# Patient Record
Sex: Male | Born: 1950 | Race: Black or African American | Hispanic: No | Marital: Married | Smoking: Current every day smoker
Health system: Southern US, Community
[De-identification: ages and names within clinical notes are randomized; demographics above are authoritative.]

## PROBLEM LIST (undated history)

## (undated) DIAGNOSIS — N1330 Unspecified hydronephrosis: Secondary | ICD-10-CM

## (undated) DIAGNOSIS — N200 Calculus of kidney: Secondary | ICD-10-CM

## (undated) DIAGNOSIS — R0600 Dyspnea, unspecified: Secondary | ICD-10-CM

## (undated) DIAGNOSIS — J069 Acute upper respiratory infection, unspecified: Secondary | ICD-10-CM

## (undated) DIAGNOSIS — R9431 Abnormal electrocardiogram [ECG] [EKG]: Secondary | ICD-10-CM

## (undated) DIAGNOSIS — E876 Hypokalemia: Secondary | ICD-10-CM

## (undated) DIAGNOSIS — C801 Malignant (primary) neoplasm, unspecified: Secondary | ICD-10-CM

## (undated) DIAGNOSIS — M1990 Unspecified osteoarthritis, unspecified site: Secondary | ICD-10-CM

## (undated) DIAGNOSIS — C20 Malignant neoplasm of rectum: Secondary | ICD-10-CM

## (undated) DIAGNOSIS — G4730 Sleep apnea, unspecified: Secondary | ICD-10-CM

## (undated) DIAGNOSIS — Z789 Other specified health status: Secondary | ICD-10-CM

## (undated) DIAGNOSIS — N134 Hydroureter: Secondary | ICD-10-CM

## (undated) DIAGNOSIS — J449 Chronic obstructive pulmonary disease, unspecified: Secondary | ICD-10-CM

## (undated) DIAGNOSIS — R011 Cardiac murmur, unspecified: Secondary | ICD-10-CM

## (undated) DIAGNOSIS — I10 Essential (primary) hypertension: Secondary | ICD-10-CM

## (undated) DIAGNOSIS — Z923 Personal history of irradiation: Secondary | ICD-10-CM

## (undated) HISTORY — DX: Cardiac murmur, unspecified: R01.1

## (undated) HISTORY — DX: Unspecified hydronephrosis: N13.30

## (undated) HISTORY — DX: Personal history of irradiation: Z92.3

## (undated) HISTORY — PX: COLON SURGERY: SHX602

## (undated) HISTORY — PX: OTHER SURGICAL HISTORY: SHX169

## (undated) HISTORY — DX: Unspecified osteoarthritis, unspecified site: M19.90

## (undated) HISTORY — PX: COLOSTOMY: SHX63

## (undated) HISTORY — DX: Abnormal electrocardiogram (ECG) (EKG): R94.31

## (undated) HISTORY — DX: Dyspnea, unspecified: R06.00

## (undated) HISTORY — DX: Essential (primary) hypertension: I10

---

## 2005-07-01 ENCOUNTER — Ambulatory Visit: Payer: Self-pay | Admitting: Internal Medicine

## 2005-11-19 ENCOUNTER — Ambulatory Visit: Payer: Self-pay | Admitting: Internal Medicine

## 2006-06-02 ENCOUNTER — Ambulatory Visit: Payer: Self-pay | Admitting: Internal Medicine

## 2006-11-23 ENCOUNTER — Ambulatory Visit: Payer: Self-pay | Admitting: Internal Medicine

## 2007-05-25 ENCOUNTER — Ambulatory Visit: Payer: Self-pay | Admitting: Internal Medicine

## 2007-07-09 DIAGNOSIS — I10 Essential (primary) hypertension: Secondary | ICD-10-CM

## 2008-05-05 ENCOUNTER — Telehealth: Payer: Self-pay | Admitting: Internal Medicine

## 2008-06-05 ENCOUNTER — Ambulatory Visit: Payer: Self-pay | Admitting: Internal Medicine

## 2008-06-05 DIAGNOSIS — N410 Acute prostatitis: Secondary | ICD-10-CM | POA: Insufficient documentation

## 2008-06-05 DIAGNOSIS — N342 Other urethritis: Secondary | ICD-10-CM | POA: Insufficient documentation

## 2008-06-05 LAB — CONVERTED CEMR LAB
ALT: 14 units/L (ref 0–53)
AST: 16 units/L (ref 0–37)
Alkaline Phosphatase: 104 units/L (ref 39–117)
Basophils Absolute: 0 10*3/uL (ref 0.0–0.1)
Basophils Relative: 0.2 % (ref 0.0–1.0)
Bilirubin Urine: NEGATIVE
Bilirubin, Direct: 0.1 mg/dL (ref 0.0–0.3)
CO2: 32 meq/L (ref 19–32)
Chloride: 101 meq/L (ref 96–112)
Cholesterol: 167 mg/dL (ref 0–200)
LDL Cholesterol: 118 mg/dL — ABNORMAL HIGH (ref 0–99)
Lymphocytes Relative: 25.2 % (ref 12.0–46.0)
MCHC: 34.5 g/dL (ref 30.0–36.0)
Neutrophils Relative %: 68 % (ref 43.0–77.0)
Nitrite: NEGATIVE
RBC: 4.69 M/uL (ref 4.22–5.81)
RDW: 11.8 % (ref 11.5–14.6)
Sodium: 141 meq/L (ref 135–145)
Total Bilirubin: 1.2 mg/dL (ref 0.3–1.2)
Urobilinogen, UA: 2
VLDL: 19 mg/dL (ref 0–40)

## 2008-06-20 ENCOUNTER — Telehealth: Payer: Self-pay | Admitting: Internal Medicine

## 2009-10-15 ENCOUNTER — Telehealth: Payer: Self-pay | Admitting: Family Medicine

## 2010-01-07 ENCOUNTER — Ambulatory Visit: Payer: Self-pay | Admitting: Internal Medicine

## 2010-01-07 DIAGNOSIS — F17200 Nicotine dependence, unspecified, uncomplicated: Secondary | ICD-10-CM | POA: Insufficient documentation

## 2010-01-07 DIAGNOSIS — H6120 Impacted cerumen, unspecified ear: Secondary | ICD-10-CM | POA: Insufficient documentation

## 2010-02-19 ENCOUNTER — Ambulatory Visit: Payer: Self-pay | Admitting: Internal Medicine

## 2010-02-19 LAB — CONVERTED CEMR LAB
ALT: 20 units/L (ref 0–53)
AST: 19 units/L (ref 0–37)
Alkaline Phosphatase: 116 units/L (ref 39–117)
CO2: 29 meq/L (ref 19–32)
Calcium: 8.6 mg/dL (ref 8.4–10.5)
Creatinine, Ser: 1.2 mg/dL (ref 0.4–1.5)
Eosinophils Relative: 1.5 % (ref 0.0–5.0)
Glucose, Bld: 131 mg/dL — ABNORMAL HIGH (ref 70–99)
HCT: 43 % (ref 39.0–52.0)
Hemoglobin: 14.7 g/dL (ref 13.0–17.0)
Ketones, ur: NEGATIVE mg/dL
Lymphocytes Relative: 27.1 % (ref 12.0–46.0)
Lymphs Abs: 2.4 10*3/uL (ref 0.7–4.0)
Monocytes Relative: 6.3 % (ref 3.0–12.0)
Platelets: 205 10*3/uL (ref 150.0–400.0)
Specific Gravity, Urine: 1.025 (ref 1.000–1.030)
TSH: 3.19 microintl units/mL (ref 0.35–5.50)
Total Bilirubin: 0.7 mg/dL (ref 0.3–1.2)
Total CHOL/HDL Ratio: 4
Total Protein, Urine: NEGATIVE mg/dL
Urine Glucose: NEGATIVE mg/dL
Urobilinogen, UA: 1 (ref 0.0–1.0)
WBC: 8.9 10*3/uL (ref 4.5–10.5)
pH: 6 (ref 5.0–8.0)

## 2010-04-24 ENCOUNTER — Ambulatory Visit: Payer: Self-pay | Admitting: Internal Medicine

## 2010-04-24 DIAGNOSIS — K648 Other hemorrhoids: Secondary | ICD-10-CM

## 2010-06-24 ENCOUNTER — Ambulatory Visit: Payer: Self-pay | Admitting: Internal Medicine

## 2010-06-24 LAB — CONVERTED CEMR LAB
Calcium: 8.9 mg/dL (ref 8.4–10.5)
GFR calc non Af Amer: 75.42 mL/min (ref >60)
Potassium: 3.6 meq/L (ref 3.5–5.1)
Sodium: 143 meq/L (ref 135–145)

## 2010-06-27 ENCOUNTER — Ambulatory Visit: Payer: Self-pay | Admitting: Internal Medicine

## 2010-06-27 DIAGNOSIS — J321 Chronic frontal sinusitis: Secondary | ICD-10-CM | POA: Insufficient documentation

## 2010-06-27 DIAGNOSIS — G4730 Sleep apnea, unspecified: Secondary | ICD-10-CM

## 2010-06-27 DIAGNOSIS — G4710 Hypersomnia, unspecified: Secondary | ICD-10-CM | POA: Insufficient documentation

## 2010-07-30 ENCOUNTER — Encounter: Payer: Self-pay | Admitting: Internal Medicine

## 2011-01-09 NOTE — Assessment & Plan Note (Signed)
Summary: cpx/njr--PT [facility] // RS   Vital Signs:  Patient profile:   60 year old male Height:      70 inches Weight:      198 pounds BMI:     28.51 Temp:     98.2 degrees F oral Pulse rate:   76 / minute Resp:     14 per minute BP sitting:   140 / 82  (left arm)  Vitals Entered By: Willy Eddy, LPN (Apr 24, 2010 3:01 PM)  Nutrition Counseling: Patient's BMI is greater than 25 and therefore counseled on weight management options. CC: cpx-no colonoscopy--coulndt afford chantix, Hypertension Management   CC:  cpx-no colonoscopy--coulndt afford chantix and Hypertension Management.  History of Present Illness: The pt was asked about all immunizations, health maint. services that are appropriate to their age and was given guidance on diet exercize  and weight management   pt complains of BRBPR with hx of hemmorhoids  Hypertension History:      He denies headache, chest pain, palpitations, dyspnea with exertion, orthopnea, PND, peripheral edema, visual symptoms, neurologic problems, syncope, and side effects from treatment.        Positive major cardiovascular risk factors include male age 15 years old or older, hypertension, and current tobacco user.  Negative major cardiovascular risk factors include negative family history for ischemic heart disease.     Preventive Screening-Counseling & Management  Alcohol-Tobacco     Smoking Status: current     Packs/Day: 1     Year Started: 1968  Problems Prior to Update: 1)  Cerumen Impaction  (ICD-380.4) 2)  Cigarette Smoker  (ICD-305.1) 3)  Unspecified Urethritis  (ICD-597.80) 4)  Acute Prostatitis  (ICD-601.0) 5)  Physical Examination  (ICD-V70.0) 6)  Hypertension  (ICD-401.9)  Current Problems (verified): 1)  Cerumen Impaction  (ICD-380.4) 2)  Cigarette Smoker  (ICD-305.1) 3)  Unspecified Urethritis  (ICD-597.80) 4)  Acute Prostatitis  (ICD-601.0) 5)  Physical Examination  (ICD-V70.0) 6)  Hypertension   (ICD-401.9)  Medications Prior to Update: 1)  Amlodipine Besylate 5 Mg Tabs (Amlodipine Besylate) .... One By Mouth Daily 2)  Bisoprolol-Hydrochlorothiazide 5-6.25 Mg Tabs (Bisoprolol-Hydrochlorothiazide) .... One By Mouth Daily 3)  Chantix 0.5 Mg Tabs (Varenicline Tartrate) .Marland Kitchen.. 1 Two Times A Day For 14 Days 4)  Chantix 1 Mg Tabs (Varenicline Tartrate) .Marland Kitchen.. 1 Two Times A Day After Completing .5 Mg For 14 Days- Then Pick A Stop Date After Completing 10 Days of 1mg  Two Times A Day  Current Medications (verified): 1)  Amlodipine Besylate 5 Mg Tabs (Amlodipine Besylate) .... One By Mouth Daily 2)  Bisoprolol-Hydrochlorothiazide 5-6.25 Mg Tabs (Bisoprolol-Hydrochlorothiazide) .... One By Mouth Daily 3)  Klor-Con 10 10 Meq Cr-Tabs (Potassium Chloride) .... One By Mouth Daily ( Take 4 Today Then Two For One Week The One A Day 4)  Ciprofloxacin Hcl 500 Mg Tabs (Ciprofloxacin Hcl) .... One By Mouth Two Times A Day For 14 Days 5)  Lidocaine-Hydrocortisone Ace 3-0.5 % Crea (Lidocaine-Hydrocortisone Ace) .... Apply Pr Two Times A Day As Needed For Hemorrhoids  Allergies (verified): No Known Drug Allergies  Past History:  Family History: Last updated: 07/09/2007 Family History Hypertension Fam hx Lupus  Social History: Last updated: 07/09/2007 Alcohol use-no Drug use-no  Risk Factors: Smoking Status: current (04/24/2010) Packs/Day: 1 (04/24/2010)  Past medical, surgical, family and social histories (including risk factors) reviewed, and no changes noted (except as noted below).  Past Medical History: Reviewed history from 07/09/2007 and no changes required. Abnormal EKG  Hypertension  Past Surgical History: Reviewed history from 06/05/2008 and no changes required. wrist and hand surgery  Family History: Reviewed history from 07/09/2007 and no changes required. Family History Hypertension Fam hx Lupus  Social History: Reviewed history from 07/09/2007 and no changes  required. Alcohol use-no Drug use-no  Review of Systems  The patient denies anorexia, fever, weight loss, weight gain, vision loss, decreased hearing, hoarseness, chest pain, syncope, dyspnea on exertion, peripheral edema, prolonged cough, headaches, hemoptysis, abdominal pain, melena, hematochezia, severe indigestion/heartburn, hematuria, incontinence, genital sores, muscle weakness, suspicious skin lesions, transient blindness, difficulty walking, depression, unusual weight change, abnormal bleeding, enlarged lymph nodes, angioedema, breast masses, and testicular masses.    Physical Exam  General:  Well-developed,well-nourished,in no acute distress; alert,appropriate and cooperative throughout examination Head:  normocephalic, no abnormalities observed, and male-pattern balding.   Eyes:  pupils equal, pupils round, pupils reactive to light, and no nystagmus.   Ears:  R ear normal and L ear normal.   Nose:  no external deformity and no nasal discharge.   Neck:  No deformities, masses, or tenderness noted. Chest Wall:  No deformities, masses, tenderness or gynecomastia noted. Lungs:  normal respiratory effort and no wheezes.   Heart:  normal rate and regular rhythm.   Abdomen:  Bowel sounds positive,abdomen soft and non-tender without masses, organomegaly or hernias noted. Rectal:  external hemorrhoid(s) and internal hemorrhoid(s).   Genitalia:  circumcised.   Prostate:  tender, boggy, and 1+ enlarged.   Msk:  No deformity or scoliosis noted of thoracic or lumbar spine.   Pulses:  R and L carotid,radial,femoral,dorsalis pedis and posterior tibial pulses are full and equal bilaterally Extremities:  No clubbing, cyanosis, edema, or deformity noted with normal full range of motion of all joints.   Neurologic:  alert & oriented X3 and gait normal.     Impression & Recommendations:  Problem # 1:  HYPERTENSION (ICD-401.9)  His updated medication list for this problem includes:     Amlodipine Besylate 5 Mg Tabs (Amlodipine besylate) ..... One by mouth daily    Bisoprolol-hydrochlorothiazide 5-6.25 Mg Tabs (Bisoprolol-hydrochlorothiazide) ..... One by mouth daily  Orders: EKG w/ Interpretation (93000)  BP today: 140/82 Prior BP: 150/80 (01/07/2010)  Prior 10 Yr Risk Heart Disease: 40 % (01/07/2010)  Labs Reviewed: K+: 2.9 (02/19/2010) Creat: : 1.2 (02/19/2010)   Chol: 155 (02/19/2010)   HDL: 38.70 (02/19/2010)   LDL: 88 (02/19/2010)   TG: 143.0 (02/19/2010)  Problem # 2:  INTERNAL HEMORRHOIDS WITH OTHER COMPLICATION (ICD-455.2) call in hemrrhoidal treatemnt  Problem # 3:  INTERNAL HEMORRHOIDS WITH OTHER COMPLICATION (ICD-455.2)  Problem # 4:  PHYSICAL EXAMINATION (ICD-V70.0) The pt was asked about all immunizations, health maint. services that are appropriate to their age and was given guidance on diet exercize  and weight management  Td Booster: Historical (12/08/2005)   Flu Vax: Fluvax 3+ (01/07/2010)   Chol: 155 (02/19/2010)   HDL: 38.70 (02/19/2010)   LDL: 88 (02/19/2010)   TG: 143.0 (02/19/2010) TSH: 3.19 (02/19/2010)   PSA: 1.33 (02/19/2010)  Discussed using sunscreen, use of alcohol, drug use, self testicular exam, routine dental care, routine eye care, routine physical exam, seat belts, multiple vitamins, osteoporosis prevention, adequate calcium intake in diet, and recommendations for immunizations.  Discussed exercise and checking cholesterol.  Discussed gun safety, safe sex, and contraception. Also recommend checking PSA.  Complete Medication List: 1)  Amlodipine Besylate 5 Mg Tabs (Amlodipine besylate) .... One by mouth daily 2)  Bisoprolol-hydrochlorothiazide 5-6.25 Mg Tabs (Bisoprolol-hydrochlorothiazide) .Marland KitchenMarland KitchenMarland Kitchen  One by mouth daily 3)  Klor-con 10 10 Meq Cr-tabs (Potassium chloride) .... One by mouth daily ( take 4 today then two for one week the one a day 4)  Ciprofloxacin Hcl 500 Mg Tabs (Ciprofloxacin hcl) .... One by mouth two times a day for 14  days 5)  Lidocaine-hydrocortisone Ace 3-0.5 % Crea (Lidocaine-hydrocortisone ace) .... Apply pr two times a day as needed for hemorrhoids  Hypertension Assessment/Plan:      The patient's hypertensive risk group is category B: At least one risk factor (excluding diabetes) with no target organ damage.  His calculated 10 year risk of coronary heart disease is 18 %.  Today's blood pressure is 140/82.  His blood pressure goal is < 140/90.  Patient Instructions: 1)  Please schedule a follow-up appointment in 2 months 2)  BMP prior to visit, ICD-9:401.90 Prescriptions: LIDOCAINE-HYDROCORTISONE ACE 3-0.5 % CREA (LIDOCAINE-HYDROCORTISONE ACE) apply PR two times a day as needed for hemorrhoids  #3 tubes x 3   Entered and Authorized by:   Stacie Glaze MD   Signed by:   Stacie Glaze MD on 04/24/2010   Method used:   Electronically to        [facility]* (mail-order)             ,          Ph: [phone]       Fax: [phone]   RxID:   [id number] CIPROFLOXACIN HCL 500 MG TABS (CIPROFLOXACIN HCL) one by mouth two times a day for 14 days  #28 x 0   Entered and Authorized by:   Stacie Glaze MD   Signed by:   Stacie Glaze MD on 04/24/2010   Method used:   Electronically to        [facility]  [address] [id number] (retail)       [address]       [place], [place]  [address]       Ph: [phone] or [phone]       Fax: [phone]   RxID:   [id number] KLOR-CON 10 10 MEQ CR-TABS (POTASSIUM CHLORIDE) one by mouth daily ( take 4 today then two for one week the one a day  #100 x 6   Entered and Authorized by:   Stacie Glaze MD   Signed by:   Stacie Glaze MD on 04/24/2010   Method used:   Electronically to        [facility]* (mail-order)             ,          Ph: [phone]       Fax: [phone]   RxID:   [id number]    Immunization History:  Tetanus/Td Immunization History:    Tetanus/Td:  historical (12/08/2005)

## 2011-01-09 NOTE — Assessment & Plan Note (Signed)
Summary: HTN F/U // RS----PT [facility] // RS   Vital Signs:  Patient profile:   60 year old male Height:      72 inches Weight:      196 pounds BMI:     26.68 Temp:     98.2 degrees F oral Pulse rate:   72 / minute Resp:     14 per minute BP sitting:   150 / 80  (left arm)  Vitals Entered By: Willy Eddy, LPN (January 07, 2010 9:29 AM) CC: roa bp c heck, Hypertension Management   CC:  roa bp c heck and Hypertension Management.  History of Present Illness: follow up after two years wants to quit smoking blood pressure follow up out of medications flank pain but no chest pains flu shot ordered   Hypertension History:      He denies headache, chest pain, palpitations, dyspnea with exertion, orthopnea, PND, peripheral edema, visual symptoms, neurologic problems, syncope, and side effects from treatment.  ran out of medications.        Positive major cardiovascular risk factors include male age 62 years old or older, hypertension, and current tobacco user.  Negative major cardiovascular risk factors include negative family history for ischemic heart disease.     Preventive Screening-Counseling & Management  Alcohol-Tobacco     Smoking Status: current     Packs/Day: 1     Year Started: 1968  Problems Prior to Update: 1)  Unspecified Urethritis  (ICD-597.80) 2)  Acute Prostatitis  (ICD-601.0) 3)  Physical Examination  (ICD-V70.0) 4)  Hypertension  (ICD-401.9)  Current Problems (verified): 1)  Unspecified Urethritis  (ICD-597.80) 2)  Acute Prostatitis  (ICD-601.0) 3)  Physical Examination  (ICD-V70.0) 4)  Hypertension  (ICD-401.9)  Medications Prior to Update: 1)  Norvasc 5 Mg  Tabs (Amlodipine Besylate) .... Take 1 Tablet By Mouth Once A Day 2)  Nadolol-Bendroflumethiazide 40-5 Mg  Tabs (Nadolol-Bendroflumethiazide) .Marland Kitchen.. 1 Once Daily 3)  Metronidazole 250 Mg  Tabs (Metronidazole) .... One By Mouth Qid For 7 Days  Current Medications (verified): 1)  Amlodipine  Besylate 5 Mg Tabs (Amlodipine Besylate) .... One By Mouth Daily 2)  Bisoprolol-Hydrochlorothiazide 5-6.25 Mg Tabs (Bisoprolol-Hydrochlorothiazide) .... One By Mouth Daily 3)  Chantix 1 Mg Tabs (Varenicline Tartrate) .... 1/2 Two Times A Day For 14 Days Then On Two Times A Day  Pick A Stop Date After 10 Days On The Medication  Allergies (verified): No Known Drug Allergies  Past History:  Family History: Last updated: 07/09/2007 Family History Hypertension Fam hx Lupus  Social History: Last updated: 07/09/2007 Alcohol use-no Drug use-no  Risk Factors: Smoking Status: current (01/07/2010) Packs/Day: 1 (01/07/2010)  Past medical, surgical, family and social histories (including risk factors) reviewed, and no changes noted (except as noted below).  Past Medical History: Reviewed history from 07/09/2007 and no changes required. Abnormal EKG Hypertension  Past Surgical History: Reviewed history from 06/05/2008 and no changes required. wrist and hand surgery  Family History: Reviewed history from 07/09/2007 and no changes required. Family History Hypertension Fam hx Lupus  Social History: Reviewed history from 07/09/2007 and no changes required. Alcohol use-no Drug use-no  Review of Systems  The patient denies anorexia, fever, weight loss, weight gain, vision loss, decreased hearing, hoarseness, chest pain, syncope, dyspnea on exertion, peripheral edema, prolonged cough, headaches, hemoptysis, abdominal pain, melena, hematochezia, severe indigestion/heartburn, hematuria, incontinence, genital sores, muscle weakness, suspicious skin lesions, transient blindness, difficulty walking, depression, unusual weight change, abnormal bleeding, enlarged lymph nodes, angioedema,  breast masses, and testicular masses.         Flu Vaccine Consent Questions     Do you have a history of severe allergic reactions to this vaccine? no    Any prior history of allergic reactions to egg and/or  gelatin? no    Do you have a sensitivity to the preservative Thimersol? no    Do you have a past history of Guillan-Barre Syndrome? no    Do you currently have an acute febrile illness? no    Have you ever had a severe reaction to latex? no    Vaccine information given and explained to patient? yes    Are you currently pregnant? no    Lot Number:[id number]   Exp Date:06/06/2010   Site Given  Left Deltoid IM   Physical Exam  General:  Well-developed,well-nourished,in no acute distress; alert,appropriate and cooperative throughout examination Head:  Normocephalic and atraumatic without obvious abnormalities. No apparent alopecia or balding. Eyes:  No corneal or conjunctival inflammation noted. EOMI. Perrla. Funduscopic exam benign, without hemorrhages, exudates or papilledema. Vision grossly normal. Ears:  L canal drainage and L Cerumen impaction.   Nose:  External nasal examination shows no deformity or inflammation. Nasal mucosa are pink and moist without lesions or exudates. Mouth:  Oral mucosa and oropharynx without lesions or exudates.  Teeth in good repair. Neck:  No deformities, masses, or tenderness noted. Lungs:  normal respiratory effort and no wheezes.   Heart:  normal rate and regular rhythm.   Abdomen:  Bowel sounds positive,abdomen soft and non-tender without masses, organomegaly or hernias noted.   Impression & Recommendations:  Problem # 1:  HYPERTENSION (ICD-401.9) blood pressure refills His updated medication list for this problem includes:    Amlodipine Besylate 5 Mg Tabs (Amlodipine besylate) ..... One by mouth daily    Bisoprolol-hydrochlorothiazide 5-6.25 Mg Tabs (Bisoprolol-hydrochlorothiazide) ..... One by mouth daily  BP today: 150/80 Prior BP: 140/80 (06/05/2008)  10 Yr Risk Heart Disease: 40 %  Labs Reviewed: K+: 3.2 (06/05/2008) Creat: : 1.4 (06/05/2008)   Chol: 167 (06/05/2008)   HDL: 29.8 (06/05/2008)   LDL: 118 (06/05/2008)   TG: 96  (06/05/2008)  Problem # 2:  CIGARETTE SMOKER (ICD-305.1) long discussion about chantic and smoking cessation Encouraged smoking cessation and discussed different methods for smoking cessation.   His updated medication list for this problem includes:    Chantix 1 Mg Tabs (Varenicline tartrate) .Marland Kitchen... 1/2 two times a day for 14 days then on two times a day  pick a stop date after 10 days on the medication  Problem # 3:  CERUMEN IMPACTION (ICD-380.4) wax impacted in left ear with tinitus informed conset obtained, using a cerumin spoon the wax impaction was dislodged and the canal was lavaged with 1/2 peroxide and 1/2 warm water solution until clear  Orders: Cerumen Impaction Removal (11914)  Complete Medication List: 1)  Amlodipine Besylate 5 Mg Tabs (Amlodipine besylate) .... One by mouth daily 2)  Bisoprolol-hydrochlorothiazide 5-6.25 Mg Tabs (Bisoprolol-hydrochlorothiazide) .... One by mouth daily 3)  Chantix 1 Mg Tabs (Varenicline tartrate) .... 1/2 two times a day for 14 days then on two times a day  pick a stop date after 10 days on the medication  Other Orders: Admin 1st Vaccine (78295) Flu Vaccine 77yrs + (62130)  Hypertension Assessment/Plan:      The patient's hypertensive risk group is category B: At least one risk factor (excluding diabetes) with no target organ damage.  His calculated 10 year risk of  coronary heart disease is 40 %.  Today's blood pressure is 150/80.  His blood pressure goal is < 140/90.  Patient Instructions: 1)  Please schedule a follow-up appointment in 2 months.  CPX Prescriptions: CHANTIX 1 MG TABS (VARENICLINE TARTRATE) 1/2 two times a day for 14 days then on two times a day  pick a stop date after 10 days on the medication  #180 x 1   Entered and Authorized by:   Stacie Glaze MD   Signed by:   Stacie Glaze MD on 01/07/2010   Method used:   Electronically to        [facility]* (mail-order)             ,          Ph: [phone]       Fax:  [phone]   RxID:   [id number] BISOPROLOL-HYDROCHLOROTHIAZIDE 5-6.25 MG TABS (BISOPROLOL-HYDROCHLOROTHIAZIDE) one by mouth daily  #30 x 0   Entered and Authorized by:   Stacie Glaze MD   Signed by:   Stacie Glaze MD on 01/07/2010   Method used:   [facility] to        [facility]  [address] [id number]       [address]       [place], [place]  [address]       Ph: [phone] or [phone]       Fax: [phone]   RxID:   [id number] AMLODIPINE BESYLATE 5 MG TABS (AMLODIPINE BESYLATE) one by mouth daily  #30 x 0   Entered and Authorized by:   Stacie Glaze MD   Signed by:   Stacie Glaze MD on 01/07/2010   Method used:   [facility]ically to        [facility]  [address] [id number] ([facility])       [address]       [place], [place]  [address]       Ph: [phone] or [phone]       Fax: [phone]   RxID:   [id number] BISOPROLOL-HYDROCHLOROTHIAZIDE 5-6.25 MG TABS (BISOPROLOL-HYDROCHLOROTHIAZIDE) one by mouth daily  #90 x 3   Entered and Authorized by:   Stacie Glaze MD   Signed by:   Stacie Glaze MD on 01/07/2010   Method used:   Electronically to        [facility]* (mail-order)             ,          Ph: [phone]       Fax: [phone]   RxID:   [id number] AMLODIPINE BESYLATE 5 MG TABS (AMLODIPINE BESYLATE) one by mouth daily  #90 x 3   Entered and Authorized by:   Stacie Glaze MD   Signed by:   Stacie Glaze MD on 01/07/2010   Method used:   Electronically to        [facility]* (mail-order)             ,          Ph: [phone]       Fax: [phone]   RxID:   [id number]

## 2011-01-09 NOTE — Assessment & Plan Note (Signed)
Summary: 2 month rov/njr   Vital Signs:  Patient profile:   60 year old male Height:      70 inches Weight:      194 pounds BMI:     27.94 Temp:     98.2 degrees F oral Pulse rate:   76 / minute Resp:     14 per minute BP sitting:   130 / 80  (left arm)  Vitals Entered By: Willy Eddy, LPN (June 27, 2010 11:37 AM) CC: roa labs, Hypertension Management Is Patient Diabetic? No   CC:  roa labs and Hypertension Management.  History of Present Illness: the pt has noted snoring, hypersonolence in the day and has high blood pressure that has been difficult to control day time sleepyness and snoring has worsened of late  Hypertension History:      He denies headache, chest pain, palpitations, dyspnea with exertion, orthopnea, PND, peripheral edema, visual symptoms, neurologic problems, syncope, and side effects from treatment.        Positive major cardiovascular risk factors include male age 24 years old or older, hypertension, and current tobacco user.  Negative major cardiovascular risk factors include negative family history for ischemic heart disease.     Preventive Screening-Counseling & Management  Alcohol-Tobacco     Smoking Status: current     Packs/Day: 1     Year Started: 1966  Problems Prior to Update: 1)  Internal Hemorrhoids With Other Complication  (ICD-455.2) 2)  Cerumen Impaction  (ICD-380.4) 3)  Cigarette Smoker  (ICD-305.1) 4)  Unspecified Urethritis  (ICD-597.80) 5)  Acute Prostatitis  (ICD-601.0) 6)  Physical Examination  (ICD-V70.0) 7)  Hypertension  (ICD-401.9)  Current Problems (verified): 1)  Internal Hemorrhoids With Other Complication  (ICD-455.2) 2)  Cerumen Impaction  (ICD-380.4) 3)  Cigarette Smoker  (ICD-305.1) 4)  Unspecified Urethritis  (ICD-597.80) 5)  Acute Prostatitis  (ICD-601.0) 6)  Physical Examination  (ICD-V70.0) 7)  Hypertension  (ICD-401.9)  Medications Prior to Update: 1)  Amlodipine Besylate 5 Mg Tabs (Amlodipine  Besylate) .... One By Mouth Daily 2)  Bisoprolol-Hydrochlorothiazide 5-6.25 Mg Tabs (Bisoprolol-Hydrochlorothiazide) .... One By Mouth Daily 3)  Klor-Con 10 10 Meq Cr-Tabs (Potassium Chloride) .... One By Mouth Daily ( Take 4 Today Then Two For One Week The One A Day 4)  Ciprofloxacin Hcl 500 Mg Tabs (Ciprofloxacin Hcl) .... One By Mouth Two Times A Day For 14 Days 5)  Lidocaine-Hydrocortisone Ace 3-0.5 % Crea (Lidocaine-Hydrocortisone Ace) .... Apply Pr Two Times A Day As Needed For Hemorrhoids  Current Medications (verified): 1)  Amlodipine Besylate 5 Mg Tabs (Amlodipine Besylate) .... One By Mouth Daily 2)  Bisoprolol-Hydrochlorothiazide 5-6.25 Mg Tabs (Bisoprolol-Hydrochlorothiazide) .... One By Mouth Daily 3)  Klor-Con 10 10 Meq Cr-Tabs (Potassium Chloride) .... One By Mouth Daily ( Take 4 Today Then Two For One Week The One A Day  Allergies (verified): No Known Drug Allergies  Past History:  Family History: Last updated: 07/09/2007 Family History Hypertension Fam hx Lupus  Social History: Last updated: 07/09/2007 Alcohol use-no Drug use-no  Risk Factors: Smoking Status: current (06/27/2010) Packs/Day: 1 (06/27/2010)  Past medical, surgical, family and social histories (including risk factors) reviewed, and no changes noted (except as noted below).  Past Medical History: Reviewed history from 07/09/2007 and no changes required. Abnormal EKG Hypertension  Past Surgical History: Reviewed history from 06/05/2008 and no changes required. wrist and hand surgery  Family History: Reviewed history from 07/09/2007 and no changes required. Family History Hypertension Fam hx  Lupus  Social History: Reviewed history from 07/09/2007 and no changes required. Alcohol use-no Drug use-no  Review of Systems  The patient denies anorexia, fever, weight loss, weight gain, vision loss, decreased hearing, hoarseness, chest pain, syncope, dyspnea on exertion, peripheral edema,  prolonged cough, headaches, hemoptysis, abdominal pain, melena, hematochezia, severe indigestion/heartburn, hematuria, incontinence, genital sores, muscle weakness, suspicious skin lesions, transient blindness, difficulty walking, depression, unusual weight change, abnormal bleeding, enlarged lymph nodes, angioedema, breast masses, and testicular masses.    Physical Exam  General:  Well-developed,well-nourished,in no acute distress; alert,appropriate and cooperative throughout examination Head:  normocephalic, no abnormalities observed, and male-pattern balding.   Eyes:  pupils equal, pupils round, pupils reactive to light, and no nystagmus.   Ears:  R ear normal and L ear normal.   Nose:  no external deformity and no nasal discharge.   Neck:  No deformities, masses, or tenderness noted. Lungs:  normal respiratory effort and no wheezes.   Heart:  normal rate and regular rhythm.   Abdomen:  Bowel sounds positive,abdomen soft and non-tender without masses, organomegaly or hernias noted. Msk:  No deformity or scoliosis noted of thoracic or lumbar spine.   Pulses:  R and L carotid,radial,femoral,dorsalis pedis and posterior tibial pulses are full and equal bilaterally Extremities:  No clubbing, cyanosis, edema, or deformity noted with normal full range of motion of all joints.   Neurologic:  alert & oriented X3 and gait normal.     Impression & Recommendations:  Problem # 1:  INTERNAL HEMORRHOIDS WITH OTHER COMPLICATION (ICD-455.2) use of prep H  Problem # 2:  HYPERTENSION (ICD-401.9) the potassium has imroved to normals range His updated medication list for this problem includes:    Amlodipine Besylate 5 Mg Tabs (Amlodipine besylate) ..... One by mouth daily    Bisoprolol-hydrochlorothiazide 5-6.25 Mg Tabs (Bisoprolol-hydrochlorothiazide) ..... One by mouth daily  BP today: 160/90 Prior BP: 140/82 (04/24/2010)  Prior 10 Yr Risk Heart Disease: 18 % (04/24/2010)  Labs Reviewed: K+: 3.6  (06/24/2010) Creat: : 1.3 (06/24/2010)   Chol: 155 (02/19/2010)   HDL: 38.70 (02/19/2010)   LDL: 88 (02/19/2010)   TG: 143.0 (02/19/2010)  Problem # 3:  CIGARETTE SMOKER (ICD-305.1)  Encouraged smoking cessation and discussed different methods for smoking cessation.   Orders: Tobacco use cessation intermediate 3-10 minutes (99406)  Problem # 4:  HYPERSOMNIA, ASSOCIATED WITH SLEEP APNEA (ICD-780.53)  observed apnea and heavy snoring  Orders: Sleep Disorder Referral (Sleep Disorder)  Problem # 5:  CHRONIC FRONTAL SINUSITIS (ICD-473.1)  The following medications were removed from the medication list:    Ciprofloxacin Hcl 500 Mg Tabs (Ciprofloxacin hcl) ..... One by mouth two times a day for 14 days His updated medication list for this problem includes:    Lodrane 12d 6-45 Mg Xr12h-tab (Brompheniramine-pseudoeph) ..... One by mouth daily  Take antibiotics for full duration. Discussed treatment options including indications for coronal CT scan of sinuses and ENT referral.   Complete Medication List: 1)  Amlodipine Besylate 5 Mg Tabs (Amlodipine besylate) .... One by mouth daily 2)  Bisoprolol-hydrochlorothiazide 5-6.25 Mg Tabs (Bisoprolol-hydrochlorothiazide) .... One by mouth daily 3)  Klor-con 10 10 Meq Cr-tabs (Potassium chloride) .... One by mouth daily ( take 4 today then two for one week the one a day 4)  Lodrane 12d 6-45 Mg Xr12h-tab (Brompheniramine-pseudoeph) .... One by mouth daily  Hypertension Assessment/Plan:      The patient's hypertensive risk group is category B: At least one risk factor (excluding diabetes) with no target  organ damage.  His calculated 10 year risk of coronary heart disease is 14 %.  Today's blood pressure is 130/80.  His blood pressure goal is < 140/90.  Patient Instructions: 1)  Please schedule a follow-up appointment in 3 months.

## 2011-01-09 NOTE — Letter (Signed)
Summary: No Show for Sleep Study  No Show for Sleep Study   Imported By: Maryln Gottron 08/01/2010 10:01:35  _____________________________________________________________________  External Attachment:    Type:   Image     Comment:   External Document

## 2011-02-23 ENCOUNTER — Other Ambulatory Visit: Payer: Self-pay | Admitting: Internal Medicine

## 2011-05-06 ENCOUNTER — Telehealth: Payer: Self-pay | Admitting: *Deleted

## 2011-05-06 NOTE — Telephone Encounter (Signed)
Come in am-Wednesday when dr Lovell Sheehan is here

## 2011-05-06 NOTE — Telephone Encounter (Signed)
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 Pt needs to know when he can come in to have Dr. Lovell Sheehan sign the new card.

## 2011-05-06 NOTE — Telephone Encounter (Signed)
May come in am when dr Lovell Sheehan is here

## 2011-07-22 ENCOUNTER — Encounter: Payer: Self-pay | Admitting: Internal Medicine

## 2011-07-23 ENCOUNTER — Encounter: Payer: Self-pay | Admitting: Internal Medicine

## 2011-07-23 ENCOUNTER — Ambulatory Visit (INDEPENDENT_AMBULATORY_CARE_PROVIDER_SITE_OTHER): Payer: BC Managed Care – PPO | Admitting: Internal Medicine

## 2011-07-23 DIAGNOSIS — Z0000 Encounter for general adult medical examination without abnormal findings: Secondary | ICD-10-CM

## 2011-07-23 DIAGNOSIS — K648 Other hemorrhoids: Secondary | ICD-10-CM

## 2011-07-23 DIAGNOSIS — K6289 Other specified diseases of anus and rectum: Secondary | ICD-10-CM

## 2011-07-23 MED ORDER — HYDROCORTISONE ACETATE 30 MG RE SUPP: 1.0000 | Freq: Two times a day (BID) | RECTAL | Status: DC

## 2011-07-23 MED ORDER — LEVOFLOXACIN 500 MG PO TABS: 500.0000 mg | ORAL_TABLET | Freq: Every day | ORAL | Status: AC

## 2011-07-23 NOTE — Progress Notes (Signed)
Addended by: Stacie Glaze MD E on: 07/23/2011 04:35 PM   Modules accepted: Orders

## 2011-07-23 NOTE — Progress Notes (Signed)
  Subjective:    Patient ID: [name] [name], male    DOB: 06-Sep-1951, 60 y.o.   MRN: [id number]  HPI   Patient is a 60 year old white male who presents with an acute complaint of rectal pain he states that the pain radiates from the top of his anal area down his legs.  He states that this has been going on for several days he has mild dysuria he states that he has had some loose stools and has noticed some blood upon wiping.  He has not had a colonoscopy despite being referred but he denies any significant weight loss night sweats nausea or vomiting he has not been constipated.  He does have a history of hemorrhoids Review of Systems  Constitutional: Negative for fever and fatigue.  HENT: Negative for hearing loss, congestion, neck pain and postnasal drip.   Eyes: Negative for discharge, redness and visual disturbance.  Respiratory: Negative for cough, shortness of breath and wheezing.   Cardiovascular: Negative for leg swelling.  Gastrointestinal: Positive for diarrhea, blood in stool, anal bleeding and rectal pain. Negative for abdominal pain, constipation and abdominal distention.  Genitourinary: Negative for urgency and frequency.  Musculoskeletal: Negative for joint swelling and arthralgias.  Skin: Negative for color change and rash.  Neurological: Negative for weakness and light-headedness.  Hematological: Negative for adenopathy.  Psychiatric/Behavioral: Negative for behavioral problems.   Past Medical History  Diagnosis Date  . Hypertension   . Abnormal EKG    Past Surgical History  Procedure Date  . Wrist and hand surgery     reports that he has never smoked. He does not have any smokeless tobacco history on file. He reports that he does not drink alcohol or use illicit drugs. family history includes Anemia in his mother; Hypertension in his father; and Lupus in an unspecified family member. No Known Allergies     Objective:   Physical Exam  Nursing note and  vitals reviewed. Constitutional: He is oriented to person, place, and time. He appears well-developed and well-nourished.  HENT:  Head: Normocephalic and atraumatic.  Eyes: Conjunctivae are normal. Pupils are equal, round, and reactive to light.  Neck: Normal range of motion. Neck supple.  Cardiovascular: Normal rate and regular rhythm.   Pulmonary/Chest: Effort normal and breath sounds normal.  Abdominal: Soft. Bowel sounds are normal.  Genitourinary:       Rectal tone is increased in tone hemorrhoids are present. There is bleeding from the internal hemorrhoids which is evident above the anal vault the prostate is boggy and tender there is no apparent lesion seen above the anal bulb to endoscopy.  Patient tolerated endoscopy without complications no blood was seen on the plastic scope  Musculoskeletal: Normal range of motion.  Neurological: He is alert and oriented to person, place, and time.          Assessment & Plan:  Complicated rectal pain I do not see an abscess at this time however I do see internal hemorrhoids and acute prostatitis.  It is worrisome that he has not had a colonoscopy at age 60 and regurgitant to be referred to gastroenterology for colonoscopy.  We will treat the rectal pain with steroid suppositories and will treat the prostatitis with an antibiotic for 21 days  However for this patient for colonoscopy to gastroenterology

## 2011-08-20 ENCOUNTER — Ambulatory Visit (AMBULATORY_SURGERY_CENTER): Payer: BC Managed Care – PPO | Admitting: *Deleted

## 2011-08-20 VITALS — Ht 70.0 in | Wt 181.0 lb

## 2011-08-20 DIAGNOSIS — K625 Hemorrhage of anus and rectum: Secondary | ICD-10-CM

## 2011-08-20 DIAGNOSIS — Z1211 Encounter for screening for malignant neoplasm of colon: Secondary | ICD-10-CM

## 2011-08-20 DIAGNOSIS — R197 Diarrhea, unspecified: Secondary | ICD-10-CM

## 2011-08-20 MED ORDER — PEG-KCL-NACL-NASULF-NA ASC-C 100 G PO SOLR: ORAL | Status: DC

## 2011-09-03 ENCOUNTER — Ambulatory Visit (AMBULATORY_SURGERY_CENTER): Payer: BC Managed Care – PPO | Admitting: Gastroenterology

## 2011-09-03 ENCOUNTER — Ambulatory Visit (INDEPENDENT_AMBULATORY_CARE_PROVIDER_SITE_OTHER)
Admission: RE | Admit: 2011-09-03 | Discharge: 2011-09-03 | Disposition: A | Discharge status: Home / Self Care | Payer: BC Managed Care – PPO | Source: Ambulatory Visit | Attending: Gastroenterology | Admitting: Gastroenterology

## 2011-09-03 ENCOUNTER — Telehealth: Payer: Self-pay | Admitting: *Deleted

## 2011-09-03 ENCOUNTER — Ambulatory Visit (INDEPENDENT_AMBULATORY_CARE_PROVIDER_SITE_OTHER)
Admission: RE | Admit: 2011-09-03 | Discharge: 2011-09-03 | Disposition: A | Discharge status: Home / Self Care | Payer: BC Managed Care – PPO | Source: Ambulatory Visit

## 2011-09-03 ENCOUNTER — Encounter: Payer: Self-pay | Admitting: Gastroenterology

## 2011-09-03 ENCOUNTER — Other Ambulatory Visit: Payer: BC Managed Care – PPO

## 2011-09-03 ENCOUNTER — Other Ambulatory Visit (INDEPENDENT_AMBULATORY_CARE_PROVIDER_SITE_OTHER): Payer: BC Managed Care – PPO

## 2011-09-03 DIAGNOSIS — D126 Benign neoplasm of colon, unspecified: Secondary | ICD-10-CM

## 2011-09-03 DIAGNOSIS — C189 Malignant neoplasm of colon, unspecified: Secondary | ICD-10-CM

## 2011-09-03 DIAGNOSIS — C20 Malignant neoplasm of rectum: Secondary | ICD-10-CM

## 2011-09-03 DIAGNOSIS — R198 Other specified symptoms and signs involving the digestive system and abdomen: Secondary | ICD-10-CM

## 2011-09-03 DIAGNOSIS — K6289 Other specified diseases of anus and rectum: Secondary | ICD-10-CM

## 2011-09-03 DIAGNOSIS — Z1211 Encounter for screening for malignant neoplasm of colon: Secondary | ICD-10-CM

## 2011-09-03 DIAGNOSIS — K625 Hemorrhage of anus and rectum: Secondary | ICD-10-CM

## 2011-09-03 DIAGNOSIS — R197 Diarrhea, unspecified: Secondary | ICD-10-CM

## 2011-09-03 MED ORDER — IOHEXOL 300 MG/ML  SOLN: 100.0000 mL | Freq: Once | INTRAMUSCULAR | Status: AC | PRN

## 2011-09-03 MED ORDER — SODIUM CHLORIDE 0.9 % IV SOLN: 500.0000 mL | INTRAVENOUS | Status: DC

## 2011-09-03 NOTE — Telephone Encounter (Signed)
Spoke with pt and his mom in [facility] to inform him Dr Jarold Motto would like to refer him to a Runner, broadcasting/film/video which has her office in the [employer].I have faxed the referral to Tiffany and will send the Path, and other info when received. We or the [facility] will contact him with the appt; Dr Jarold Motto would like him seen prior to surgery.    Informed pt [facility] called and he has an appt with Dr Michaell Cowing on 09/15/11, be there at 0815am for a 0845am appt. I went over his CT scan instructions again with contrast administration times and location on [facility]. Pt and mom stated understanding and will call for questions.

## 2011-09-03 NOTE — Progress Notes (Signed)
Pt did have some cramping with the scope advancement.  Medications were titrated per the doctor's orders.  Once the cecum was reached, and the scope was being removed the pt relax and rested  Comfortably. Maw  Dr Jarold Motto removed 2 polyps but did not retrieve the specimen.  He is ware of this.  He cuaterized both areas one was the right cold tiney polyp and the other left colon which also was tiney. maw

## 2011-09-03 NOTE — Patient Instructions (Signed)
FOLLOW DISCHARGE INSTRUCTIONS (BLUE & GREEN SHEETS).  CT scan ordered for today @ 2: 30 today on [address]. One bottle of contrAST TO BE DRANK @ 12:30 TODAY & ONE BOTTLE OF CONTRAST TO BE DRANK @ 1:30.   SURGERY CONSULT 8:15 WITH DR GROSS. @ [facility].    GENETIC STUDIES MAY NEED TO BE SET UP.   LA WORK WILL BE DONE TODAY IN BASEMENT PRIOR TO DISCHARGE  TODAY TO MAKE SURE KIDNEY STUDIES OK FOR CONTRAST TO BE GIVEN FOR CT SCAN.

## 2011-09-03 NOTE — Telephone Encounter (Signed)
Dr Jarold Motto called from [facility] and would like patient to have a CT chest (without contrast), abdomen and pelvis with contrast as well as a surgical consult (preferably with Dr Michaell Cowing) for colon cancer. CT chest, abdomen and pelvis is @ 2:30 pm today.

## 2011-09-03 NOTE — Progress Notes (Signed)
  [name] [name] in to go over ct scan for today & surg consult details with pt. & mother.

## 2011-09-04 ENCOUNTER — Telehealth: Payer: Self-pay | Admitting: *Deleted

## 2011-09-04 NOTE — Telephone Encounter (Signed)

## 2011-09-08 ENCOUNTER — Encounter (INDEPENDENT_AMBULATORY_CARE_PROVIDER_SITE_OTHER): Payer: Self-pay | Admitting: Surgery

## 2011-09-08 ENCOUNTER — Ambulatory Visit (INDEPENDENT_AMBULATORY_CARE_PROVIDER_SITE_OTHER): Payer: BC Managed Care – PPO | Admitting: Surgery

## 2011-09-08 VITALS — BP 150/90 | HR 76 | Temp 97.4°F | Resp 16 | Ht 69.0 in | Wt 180.2 lb

## 2011-09-08 DIAGNOSIS — K769 Liver disease, unspecified: Secondary | ICD-10-CM

## 2011-09-08 DIAGNOSIS — N134 Hydroureter: Secondary | ICD-10-CM

## 2011-09-08 DIAGNOSIS — N200 Calculus of kidney: Secondary | ICD-10-CM

## 2011-09-08 DIAGNOSIS — R160 Hepatomegaly, not elsewhere classified: Secondary | ICD-10-CM | POA: Insufficient documentation

## 2011-09-08 DIAGNOSIS — C20 Malignant neoplasm of rectum: Secondary | ICD-10-CM

## 2011-09-08 NOTE — Progress Notes (Addendum)
ASSESSMENT AND PLAN: 1.  Rectal Cancer.  He needs an medical and radiation oncology consult.  ? Local extension on CT scan.?   The mass looks polypoid on the colonoscopy by Dr. Jarold [name].  Would there be a benefit from local excision of the tumor?  I suggested that he bring his wife with him for discussions about his treatment.  I gave him copies of his path report, CT scan, and a book on colo-rectal cancer treatment/surgery.    [GI Ca Conf 10/01/11:  Saw [name] [name].  On PET has 1 cm lesion in left lobe of liver (only one lesion in liver on PET), central left lung lesion.  CEA-13.8.  Saw [name] [name] for RTx.  Plan radiation/xeloda and re-evaluate.  DN 10/01/11]   [GI Ca Conf 11/26/11.  Has completed radiation/xeloda.  The plan is to restage the cancer with PET/CT and MRI of liver.  Dr. Brunilda Payor has seen patient for right ureteral stone. Recheck CEA. Probably continue chemotx.  If disease stable or improved, consider resection of primary tumor resection in the future.  DN  11/26/2011]  2.  Hepatic nodules.  ? Mets?  Would get input from oncology.  Will probably need PET scan/CEA.    3.  Right hydroureter secondary to nephrolithiasis.  Will get urology consult.  This appears unrelated to his rectal cancer.  4.  Hypertension. 5.  Smokes.  Knows it is bad for his health.   Chief Complaint  Patient presents with  . Other    Eval of colon cancer   REFERRING PHYSICIAN:  Sheryn [name], ChristianD.  HISTORY OF PRESENT ILLNESS: [name] [name] is a 60 y.o. (DOB: 1951/02/22)  AA male whose primary care physician is [name] Mew, MD and comes to me today for rectal cancer.  The patient has had some rectal bleeding for about one year.  This has been attributed to hemorrhoids.  He also has noticed that his stools have been looser over the last year.  About one to two months ago, he had some rectal pain and was referred to Dr. Nyra [name].  He saw Dr. Jarold [name] who did a colonoscopy on him on  09/03/2011.  Dr. Jarold [name] saw a 3 cm transverse colon polyp and some smaller left colon polyps.  Biopsies of these were benign.  However, in [name] [name]'s rectum, there was a 5 cm villous/necrotic tumor. (Photos are in Dr. Norval Gable colonoscopy report.)  Biopsy of the rectal tumor read by Dr. [place] [name] showed invasive adenocarcinoma (Case No.: [id number].)  The patient also had a CT scan the same day.  The CT scan showed: 1. Large 4 cm rectal mass. 2. Left perirectal mass is concerning for local extension of the tumor. 3. Multiple hepatic hypodensities are concerning for hepatic metastasis. 4. Chronic right hydronephrosis secondary to a obstructing calculus in the mid right ureter.  The other GI history:  No history of stomach disease.  No history of liver disease.  No history of gall bladder disease.  No history of pancreas disease.  Past Medical History  Diagnosis Date  . Hypertension   . Abnormal EKG   . Arthritis   . Chronic bronchitis   . Diarrhea   . Rectal pain   . Nasal congestion   . Blood in stool     Past Surgical History  Procedure Date  . Wrist and hand surgery     Current Outpatient Prescriptions  Medication Sig Dispense Refill  . amLODipine (NORVASC) 5 MG tablet TAKE 1 TABLET DAILY  90 tablet  2  . bisoprolol-hydrochlorothiazide (ZIAC) 5-6.25 MG per tablet TAKE 1 TABLET DAILY  90 tablet  2  . naproxen sodium (ANAPROX) 220 MG tablet Take 220 mg by mouth daily as needed. Takes 1-2 as needed for shoulder pain       . potassium chloride (KLOR-CON) 10 MEQ CR tablet Take 10 mEq by mouth daily.          No Known Allergies  REVIEW OF SYSTEMS: Skin:  No history of rash.  No history of abnormal moles. Infection:  No history of hepatitis or HIV.  No history of MRSA. Neurologic:  No history of stroke.  No history of seizure.  No history of headaches. Cardiac:  Hypertension x 10 years. No history of heart disease.  No history of prior cardiac catheterization.  No history of  seeing a cardiologist. Pulmonary:  Smokes cigarettes - 1-1 1/2 PPD. No OSA/CPAP.  Endocrine:  No diabetes. No thyroid disease. Gastrointestinal:  See HPI. Urologic:  History of kidney stones.  Has not seen a urologist.  No history of bladder infections. Musculoskeletal:  Bilateral shoulder pain.  Not seeing anyone for this. Hematologic:  No bleeding disorder.  No history of anemia.  Not anticoagulated.  SOCIAL and FAMILY HISTORY:  [employer] driver  Married.  Wife is not here.  No children. No family history of colon cancer.  PHYSICAL EXAM: BP 150/90  Pulse 76  Temp(Src) 97.4 F (36.3 C) (Temporal)  Resp 16  Ht 5\' 9"  (1.753 m)  Wt 180 lb 3.2 oz (81.738 kg)  BMI 26.61 kg/m2  General: WN BM. HEENT: Normal. Pupils equal. Normal dentition. Neck: Supple. No thyroid mass. Lymph Nodes:  No supraclavicular or cervical nodes. Lungs: Clear and symmetric. Heart:  RRR. No murmur.  Abdomen: No mass. No tenderness. No hernia. Normal bowel sounds.  No abdominal scars. Rectal:  Somewhat tender.  Mild rectal spasm.  I can feel a mass at the tip of my finger - 8 cm?  It does not seem fixed. Extremities:  Good strength in upper and lower extremities. Neurologic:  Grossly intact to motor and sensory function.  DATA REVIEWED: Notes from Dr. Jarold [name] and path report. He had a CT scan 09/03/2011.  1. Large 4 cm rectal mass.   2. Left perirectal mass is concerning for local extension of the tumor.   3. Multiple hepatic hypodensities are concerning for hepatic metastasis. Consider FDG PET scan or contrast MRI of the abdomen for further evaluation.   4. Chronic right hydronephrosis secondary to a obstructing calculus in the mid right ureter.   Ovidio Kin, ChristianD., [facility], [place] [phone]

## 2011-09-08 NOTE — Patient Instructions (Addendum)
1.  Will get consult for medical oncology, radiation oncology, and urology.  2.  ? Further testing such as Rectal [place] (to measure tumor) and PET scan (will wait for input for medical oncology).  3.  Probably chemotherapy and radiation to rectum first and surgery second.  4.  We will talk on phone about consultants recommendations and plans.

## 2011-09-12 ENCOUNTER — Other Ambulatory Visit (INDEPENDENT_AMBULATORY_CARE_PROVIDER_SITE_OTHER): Payer: Self-pay | Admitting: Surgery

## 2011-09-12 DIAGNOSIS — C20 Malignant neoplasm of rectum: Secondary | ICD-10-CM

## 2011-09-15 ENCOUNTER — Other Ambulatory Visit: Payer: Self-pay | Admitting: Radiation Oncology

## 2011-09-15 ENCOUNTER — Ambulatory Visit
Admission: RE | Admit: 2011-09-15 | Discharge: 2011-09-15 | Disposition: A | Discharge status: Home / Self Care | Payer: BC Managed Care – PPO | Source: Ambulatory Visit | Attending: Radiation Oncology | Admitting: Radiation Oncology

## 2011-09-15 ENCOUNTER — Ambulatory Visit (INDEPENDENT_AMBULATORY_CARE_PROVIDER_SITE_OTHER): Payer: BC Managed Care – PPO | Admitting: Surgery

## 2011-09-15 DIAGNOSIS — C20 Malignant neoplasm of rectum: Secondary | ICD-10-CM | POA: Insufficient documentation

## 2011-09-15 DIAGNOSIS — R197 Diarrhea, unspecified: Secondary | ICD-10-CM | POA: Insufficient documentation

## 2011-09-15 DIAGNOSIS — Z79899 Other long term (current) drug therapy: Secondary | ICD-10-CM | POA: Insufficient documentation

## 2011-09-15 DIAGNOSIS — I10 Essential (primary) hypertension: Secondary | ICD-10-CM | POA: Insufficient documentation

## 2011-09-15 DIAGNOSIS — Z510 Encounter for antineoplastic radiation therapy: Secondary | ICD-10-CM | POA: Insufficient documentation

## 2011-09-15 DIAGNOSIS — R300 Dysuria: Secondary | ICD-10-CM | POA: Insufficient documentation

## 2011-09-16 ENCOUNTER — Telehealth: Payer: Self-pay | Admitting: Radiation Oncology

## 2011-09-16 ENCOUNTER — Encounter: Payer: Self-pay | Admitting: *Deleted

## 2011-09-17 ENCOUNTER — Other Ambulatory Visit: Payer: Self-pay | Admitting: Oncology

## 2011-09-17 ENCOUNTER — Encounter (HOSPITAL_BASED_OUTPATIENT_CLINIC_OR_DEPARTMENT_OTHER): Payer: BC Managed Care – PPO | Admitting: Oncology

## 2011-09-17 DIAGNOSIS — A029 Salmonella infection, unspecified: Secondary | ICD-10-CM

## 2011-09-17 LAB — CBC WITH DIFFERENTIAL/PLATELET
Eosinophils Absolute: 0.1 10*3/uL (ref 0.0–0.5)
MONO#: 0.5 10*3/uL (ref 0.1–0.9)
NEUT#: 7.4 10*3/uL — ABNORMAL HIGH (ref 1.5–6.5)
Platelets: 209 10*3/uL (ref 140–400)
RBC: 4.09 10*6/uL — ABNORMAL LOW (ref 4.20–5.82)
RDW: 12.4 % (ref 11.0–14.6)
WBC: 10 10*3/uL (ref 4.0–10.3)

## 2011-09-17 LAB — COMPREHENSIVE METABOLIC PANEL
Albumin: 4.1 g/dL (ref 3.5–5.2)
CO2: 23 mEq/L (ref 19–32)
Chloride: 105 mEq/L (ref 96–112)
Glucose, Bld: 129 mg/dL — ABNORMAL HIGH (ref 70–99)
Potassium: 3.6 mEq/L (ref 3.5–5.3)
Sodium: 142 mEq/L (ref 135–145)
Total Protein: 6.6 g/dL (ref 6.0–8.3)

## 2011-09-17 LAB — CEA: CEA: 13.8 ng/mL — ABNORMAL HIGH (ref 0.0–5.0)

## 2011-09-18 ENCOUNTER — Ambulatory Visit (HOSPITAL_COMMUNITY)
Admission: RE | Admit: 2011-09-18 | Discharge: 2011-09-18 | Disposition: A | Discharge status: Home / Self Care | Payer: BC Managed Care – PPO | Source: Ambulatory Visit | Attending: Radiation Oncology | Admitting: Radiation Oncology

## 2011-09-18 DIAGNOSIS — C20 Malignant neoplasm of rectum: Secondary | ICD-10-CM

## 2011-09-18 DIAGNOSIS — K7689 Other specified diseases of liver: Secondary | ICD-10-CM | POA: Insufficient documentation

## 2011-09-18 LAB — GLUCOSE, CAPILLARY: Glucose-Capillary: 102 mg/dL — ABNORMAL HIGH (ref 70–99)

## 2011-09-18 MED ORDER — FLUDEOXYGLUCOSE F - 18 (FDG) INJECTION
18.9000 | Freq: Once | INTRAVENOUS | Status: AC | PRN
  Administered 2011-09-18: 18.9 via INTRAVENOUS

## 2011-09-19 ENCOUNTER — Encounter: Payer: Self-pay | Admitting: Radiation Oncology

## 2011-10-06 ENCOUNTER — Encounter: Payer: Self-pay | Admitting: *Deleted

## 2011-10-06 DIAGNOSIS — N1330 Unspecified hydronephrosis: Secondary | ICD-10-CM | POA: Insufficient documentation

## 2011-10-08 ENCOUNTER — Other Ambulatory Visit (HOSPITAL_COMMUNITY): Payer: Self-pay | Admitting: Urology

## 2011-10-08 DIAGNOSIS — N1330 Unspecified hydronephrosis: Secondary | ICD-10-CM

## 2011-10-10 ENCOUNTER — Other Ambulatory Visit: Payer: Self-pay | Admitting: Oncology

## 2011-10-10 ENCOUNTER — Telehealth: Payer: Self-pay | Admitting: Oncology

## 2011-10-10 ENCOUNTER — Encounter (HOSPITAL_BASED_OUTPATIENT_CLINIC_OR_DEPARTMENT_OTHER): Payer: BC Managed Care – PPO | Admitting: Oncology

## 2011-10-10 DIAGNOSIS — C801 Malignant (primary) neoplasm, unspecified: Secondary | ICD-10-CM

## 2011-10-10 DIAGNOSIS — Z79899 Other long term (current) drug therapy: Secondary | ICD-10-CM

## 2011-10-10 DIAGNOSIS — C20 Malignant neoplasm of rectum: Secondary | ICD-10-CM

## 2011-10-10 DIAGNOSIS — A029 Salmonella infection, unspecified: Secondary | ICD-10-CM

## 2011-10-10 LAB — CBC WITH DIFFERENTIAL/PLATELET
BASO%: 0.4 % (ref 0.0–2.0)
Eosinophils Absolute: 0.1 10*3/uL (ref 0.0–0.5)
HCT: 39.7 % (ref 38.4–49.9)
LYMPH%: 10 % — ABNORMAL LOW (ref 14.0–49.0)
MCHC: 34.7 g/dL (ref 32.0–36.0)
MONO#: 0.2 10*3/uL (ref 0.1–0.9)
NEUT%: 84.7 % — ABNORMAL HIGH (ref 39.0–75.0)
Platelets: 181 10*3/uL (ref 140–400)
WBC: 6.9 10*3/uL (ref 4.0–10.3)

## 2011-10-10 NOTE — Telephone Encounter (Signed)
gve the pt his nov 2012 appt calendar °

## 2011-10-13 ENCOUNTER — Ambulatory Visit
Admission: RE | Admit: 2011-10-13 | Discharge: 2011-10-13 | Disposition: A | Discharge status: Home / Self Care | Payer: BC Managed Care – PPO | Source: Ambulatory Visit | Attending: Radiation Oncology | Admitting: Radiation Oncology

## 2011-10-13 NOTE — Progress Notes (Deleted)
CC:   Stacie Glaze, MD Vania Rea. Jarold Motto, MD, Caleen Essex, FAGA Sandria Bales. Ezzard Standing, M.D. Oneita Hurt, M.D.  HISTORY OF PRESENT ILLNESS:  [name] [name] is a 60 year old man recently diagnosed with rectal cancer.  Staging PET scan on 09/18/2011 showed a 5.1 cm distal sigmoid/rectal mass with max SUV 19.2 compatible with primary colonic adenocarcinoma.  There was local extension to the left pararectal fat with max SUV 7.7.  There were at least two suspected hepatic metastases (13 mm lesion in the lateral segment left hepatic lobe, and 10 mm lesion in the lateral right hepatic dome).  The lesion in the lateral segment left hepatic lobe had a max SUV 6.7 and in the lateral right hepatic dome max SUV 4.1.  There were additional tiny hypodensities in the liver that the radiologist commented may be cysts. There was a patchy/nodular opacity in the left lower lobe with max SUV 3.5 which the radiologist commented was worrisome for pulmonary metastasis, less likely infectious.  [name] [name] began radiation and concurrent Xeloda chemotherapy on 09/29/2011.  He is seen today for scheduled followup.  He continues to have frequent loose stools.  The pain he was experiencing has markedly improved and he is no longer having rectal bleeding.  He denies mouth sores.  No hand or foot pain or redness.  No nausea or vomiting.  He has recently had a cough.  PHYSICAL EXAMINATION:  Vital signs:  Temperature 98.8, heart rate 74, respirations 20, blood pressure 162/96, weight 177.1 pounds (180.7 pounds 09/17/2011).  Oropharynx:  Without thrush or ulceration.  Lungs: Clear.  Regular cardiac rhythm.  Abdomen:  Soft and nontender.  No hepatomegaly.  Extremities:  Without edema.  Calves are soft and nontender.  Palms are nontender without erythema.  LABORATORY DATA:  Hemoglobin 13.8, white count 6.9, absolute neutrophil count 5.8, platelet count 181,000.  IMPRESSION/PLAN: 1. Metastatic rectal cancer status  post biopsy of rectal mass     09/03/2011 with pathology showing invasive adenocarcinoma.  Staging     CT scans 09/03/2011 showed a 10 mm hypodense rounded lesion in left     lateral hepatic lobe, similar 6 mm lesion in the superior right     hepatic lobe and a smaller subcapsular lesion in the lateral right     hepatic lobe.  Rectal mass within the lumen of the bowel emanating     from the left wall measuring 4.9 x 3.5 cm and a mass within the low     left perirectal fat measuring 2.3 x 3.2 cm consistent with local     extension of carcinoma.  Staging PET scan 09/18/2011 showed a 5.1     cm distal sigmoid/rectal mass with max SUV 19.2 with local     extension into the left perirectal fat with max SUV 7.7.  At least     2 suspected hepatic metastases with max SUV 6.7 in the lateral     segment left hepatic lobe and max SUV 4.1 in the lateral right     hepatic dome.  Additional tiny hypodensities in the liver may be     cysts; patchy/nodular opacity in the left lower lobe with max SUV     3.5 worrisome for pulmonary metastasis, less likely infectious.  He     began radiation and concurrent Xeloda chemotherapy on 09/29/2011. 2. Rectal pain and bleeding secondary to #1, improved. 3. Frequent loose stools secondary to #1. 4. Hypertension. 5. Tobacco use. 6. Disposition.  [name] [name] appears  to be tolerating the radiation and     concurrent Xeloda chemotherapy well.  He will return for a followup     visit with Dr. Truett Perna on 10/28/2011.  He will contact the office     in the interim with any problems. Plan reviewed with Dr. Truett Perna.    ______________________________ Arnaldo Natal, NP LCT/MEDQ  D:  10/10/2011  T:  10/13/2011  Job:  [id number]

## 2011-10-14 ENCOUNTER — Ambulatory Visit
Admission: RE | Admit: 2011-10-14 | Discharge: 2011-10-14 | Disposition: A | Discharge status: Home / Self Care | Payer: BC Managed Care – PPO | Source: Ambulatory Visit | Attending: Radiation Oncology | Admitting: Radiation Oncology

## 2011-10-14 ENCOUNTER — Encounter (HOSPITAL_COMMUNITY)
Admission: RE | Admit: 2011-10-14 | Discharge: 2011-10-14 | Disposition: A | Discharge status: Home / Self Care | Payer: BC Managed Care – PPO | Source: Ambulatory Visit | Attending: Urology | Admitting: Urology

## 2011-10-14 ENCOUNTER — Encounter (HOSPITAL_COMMUNITY): Payer: Self-pay

## 2011-10-14 DIAGNOSIS — N2889 Other specified disorders of kidney and ureter: Secondary | ICD-10-CM | POA: Insufficient documentation

## 2011-10-14 DIAGNOSIS — N1330 Unspecified hydronephrosis: Secondary | ICD-10-CM | POA: Insufficient documentation

## 2011-10-14 DIAGNOSIS — C20 Malignant neoplasm of rectum: Secondary | ICD-10-CM | POA: Insufficient documentation

## 2011-10-14 HISTORY — DX: Malignant (primary) neoplasm, unspecified: C80.1

## 2011-10-14 MED ORDER — TECHNETIUM TC 99M MERTIATIDE
15.1000 | Freq: Once | INTRAVENOUS | Status: AC | PRN
  Administered 2011-10-14: 15.1 via INTRAVENOUS

## 2011-10-14 MED ORDER — FUROSEMIDE 10 MG/ML IJ SOLN
40.0000 mg | Freq: Once | INTRAMUSCULAR | Status: DC
  Filled 2011-10-14: qty 4

## 2011-10-14 NOTE — Discharge Instructions (Signed)
PT GIVEN LASIX PER PROTOCOL. PT ADVISED TO STAY WELL HYDRATED FOR 24 HRS.

## 2011-10-15 ENCOUNTER — Ambulatory Visit
Admission: RE | Admit: 2011-10-15 | Discharge: 2011-10-15 | Disposition: A | Discharge status: Home / Self Care | Payer: BC Managed Care – PPO | Source: Ambulatory Visit | Attending: Radiation Oncology | Admitting: Radiation Oncology

## 2011-10-15 NOTE — Progress Notes (Unsigned)
CC:   [name] E. Jenkins, MD David R. Patterson, MD, FACG, FACP, FAGA David H. Newman, M.D. Matthew A Manning, M.D.  HISTORY OF PRESENT ILLNESS:  [name] [name] is a 60-year-old man recently diagnosed with rectal cancer.  Staging PET scan on 09/18/2011 showed a 5.1 cm distal sigmoid/rectal mass with max SUV 19.2 compatible with primary colonic adenocarcinoma.  There was local extension to the left pararectal fat with max SUV 7.7.  There were at least two suspected hepatic metastases (13 mm lesion in the lateral segment left hepatic lobe, and 10 mm lesion in the lateral right hepatic dome).  The lesion in the lateral segment left hepatic lobe had a max SUV 6.7 and in the lateral right hepatic dome max SUV 4.1.  There were additional tiny hypodensities in the liver that the radiologist commented may be cysts. There was a patchy/nodular opacity in the left lower lobe with max SUV 3.5 which the radiologist commented was worrisome for pulmonary metastasis, less likely infectious.  [name] [name] began radiation and concurrent Xeloda chemotherapy on 09/29/2011.  He is seen today for scheduled followup.  He continues to have frequent loose stools.  The pain he was experiencing has markedly improved and he is no longer having rectal bleeding.  He denies mouth sores.  No hand or foot pain or redness.  No nausea or vomiting.  He has recently had a cough.  PHYSICAL EXAMINATION:  Vital signs:  Temperature 98.8, heart rate 74, respirations 20, blood pressure 162/96, weight 177.1 pounds (180.7 pounds 09/17/2011).  Oropharynx:  Without thrush or ulceration.  Lungs: Clear.  Regular cardiac rhythm.  Abdomen:  Soft and nontender.  No hepatomegaly.  Extremities:  Without edema.  Calves are soft and nontender.  Palms are nontender without erythema.  LABORATORY DATA:  Hemoglobin 13.8, white count 6.9, absolute neutrophil count 5.8, platelet count 181,000.  IMPRESSION/PLAN: 1. Metastatic rectal cancer status  post biopsy of rectal mass     09/03/2011 with pathology showing invasive adenocarcinoma.  Staging     CT scans 09/03/2011 showed a 10 mm hypodense rounded lesion in left     lateral hepatic lobe, similar 6 mm lesion in the superior right     hepatic lobe and a smaller subcapsular lesion in the lateral right     hepatic lobe.  Rectal mass within the lumen of the bowel emanating     from the left wall measuring 4.9 x 3.5 cm and a mass within the low     left perirectal fat measuring 2.3 x 3.2 cm consistent with local     extension of carcinoma.  Staging PET scan 09/18/2011 showed a 5.1     cm distal sigmoid/rectal mass with max SUV 19.2 with local     extension into the left perirectal fat with max SUV 7.7.  At least     2 suspected hepatic metastases with max SUV 6.7 in the lateral     segment left hepatic lobe and max SUV 4.1 in the lateral right     hepatic dome.  Additional tiny hypodensities in the liver may be     cysts; patchy/nodular opacity in the left lower lobe with max SUV     3.5 worrisome for pulmonary metastasis, less likely infectious.  He     began radiation and concurrent Xeloda chemotherapy on 09/29/2011. 2. Rectal pain and bleeding secondary to #1, improved. 3. Frequent loose stools secondary to #1. 4. Hypertension. 5. Tobacco use. 6. Disposition.  [name] [name] appears   to be tolerating the radiation and     concurrent Xeloda chemotherapy well.  He will return for a followup     visit with Dr. Sherrill on 10/28/2011.  He will contact the office     in the interim with any problems. Plan reviewed with Dr. Sherrill.    ______________________________ Brieanna Nau C Eleanore Junio, NP LCT/MEDQ  D:  10/10/2011  T:  10/13/2011  Job:  [id number] 

## 2011-10-16 ENCOUNTER — Ambulatory Visit
Admission: RE | Admit: 2011-10-16 | Discharge: 2011-10-16 | Disposition: A | Discharge status: Home / Self Care | Payer: BC Managed Care – PPO | Source: Ambulatory Visit | Attending: Radiation Oncology | Admitting: Radiation Oncology

## 2011-10-17 ENCOUNTER — Other Ambulatory Visit: Payer: Self-pay | Admitting: Oncology

## 2011-10-17 ENCOUNTER — Ambulatory Visit
Admission: RE | Admit: 2011-10-17 | Discharge: 2011-10-17 | Disposition: A | Discharge status: Home / Self Care | Payer: BC Managed Care – PPO | Source: Ambulatory Visit | Attending: Radiation Oncology | Admitting: Radiation Oncology

## 2011-10-17 ENCOUNTER — Encounter: Payer: Self-pay | Admitting: Radiation Oncology

## 2011-10-17 ENCOUNTER — Other Ambulatory Visit (HOSPITAL_BASED_OUTPATIENT_CLINIC_OR_DEPARTMENT_OTHER): Payer: BC Managed Care – PPO | Admitting: Lab

## 2011-10-17 VITALS — Wt 177.0 lb

## 2011-10-17 DIAGNOSIS — C20 Malignant neoplasm of rectum: Secondary | ICD-10-CM

## 2011-10-17 LAB — CBC WITH DIFFERENTIAL/PLATELET
BASO%: 0 % (ref 0.0–2.0)
EOS%: 3.1 % (ref 0.0–7.0)
HCT: 36 % — ABNORMAL LOW (ref 38.4–49.9)
LYMPH%: 8 % — ABNORMAL LOW (ref 14.0–49.0)
MCH: 36 pg — ABNORMAL HIGH (ref 27.2–33.4)
MCHC: 35.3 g/dL (ref 32.0–36.0)
NEUT%: 85.1 % — ABNORMAL HIGH (ref 39.0–75.0)
Platelets: 187 10*3/uL (ref 140–400)

## 2011-10-17 NOTE — Progress Notes (Signed)
Weekly Radiation Therapy Management  Current Dose:  27 Gy     Planned Dose: 45  Gy  Narrative . . . . . . . .The patient presents for routine under treatment assessment.   Port film x-rays were reviewed.  The chart was checked.  He notes loose stool. Space the patient has suffered with intermittent loose stools since diagnosis. This did not worsened nor become watery. Physical Findings. . Weight Stable.  No significant changes. Impression . . . . . . . The patient is  tolerating radiation. Plan . . . . . . . . . .  Continue treatment as planned.  Today, I talked with [name] [name] about the timing of surgery following radiotherapy. I explained the rationale for waiting between 6 and 8 weeks following treatment to allow resolution of radiotherapy-induced inflammation without allowing the induction of the postirradiation fibrosis before surgery is performed. Actually, the patient likes the timing of this surgical plan. He hopes to delay surgery until after the first of the year.    ---------------------------------- Artist Pais. Kathrynn Running, M.D. Radiation Oncology [phone]

## 2011-10-20 ENCOUNTER — Ambulatory Visit
Admission: RE | Admit: 2011-10-20 | Discharge: 2011-10-20 | Disposition: A | Discharge status: Home / Self Care | Payer: BC Managed Care – PPO | Source: Ambulatory Visit | Attending: Radiation Oncology | Admitting: Radiation Oncology

## 2011-10-21 ENCOUNTER — Ambulatory Visit
Admission: RE | Admit: 2011-10-21 | Discharge: 2011-10-21 | Disposition: A | Discharge status: Home / Self Care | Payer: BC Managed Care – PPO | Source: Ambulatory Visit | Attending: Radiation Oncology | Admitting: Radiation Oncology

## 2011-10-22 ENCOUNTER — Ambulatory Visit
Admission: RE | Admit: 2011-10-22 | Discharge: 2011-10-22 | Disposition: A | Discharge status: Home / Self Care | Payer: BC Managed Care – PPO | Source: Ambulatory Visit | Attending: Radiation Oncology | Admitting: Radiation Oncology

## 2011-10-22 NOTE — Telephone Encounter (Signed)
error 

## 2011-10-23 ENCOUNTER — Ambulatory Visit
Admission: RE | Admit: 2011-10-23 | Discharge: 2011-10-23 | Disposition: A | Discharge status: Home / Self Care | Payer: BC Managed Care – PPO | Source: Ambulatory Visit | Attending: Radiation Oncology | Admitting: Radiation Oncology

## 2011-10-23 ENCOUNTER — Encounter: Payer: Self-pay | Admitting: Radiation Oncology

## 2011-10-23 VITALS — BP 98/63 | HR 84 | Temp 98.8°F | Wt 177.2 lb

## 2011-10-23 DIAGNOSIS — C20 Malignant neoplasm of rectum: Secondary | ICD-10-CM

## 2011-10-23 NOTE — Progress Notes (Addendum)
Pt reports "I had diarrhea all night long, temp 101." Pt has not taken Imodium but states he is buying some today. "stomach pain related to diarrhea".  Advised he push water/fluids today, eat bland diet.  Sitting: Temp 98.8  HR 81 b/p 113/75  Standing: HR 84 b/p 95/63  Pt also states "feels like he's getting a cold, has nonprod cough and sinus drainage". Pt has hx sinus/allergy issues.  He took Coricidin last night.

## 2011-10-23 NOTE — Progress Notes (Signed)
Weekly Radiation Therapy Management  Current Dose: 34.2 Gy     Planned Dose:  50.4 Gy  Narrative . . . . . . . .The patient presents for routine under treatment assessment.   Port film x-rays were reviewed.  The chart was checked. He has been suffering with some diarrhea earlier today. He does have Imodium at home and will begin taking this later this morning. Physical Findings. . Weight essentially stable.  No significant changes. Impression . . . . . . . The patient is  tolerating radiation. Plan . . . . . . . . . .  Continue treatment as planned. Today, we discussed the management of diarrhea as well as the importance of replenishing fluids and potassium. ---------------------------------- Artist Pais Kathrynn Running, M.D. Radiation Oncology [phone]

## 2011-10-24 ENCOUNTER — Ambulatory Visit
Admission: RE | Admit: 2011-10-24 | Discharge: 2011-10-24 | Disposition: A | Discharge status: Home / Self Care | Payer: BC Managed Care – PPO | Source: Ambulatory Visit | Attending: Radiation Oncology | Admitting: Radiation Oncology

## 2011-10-25 ENCOUNTER — Ambulatory Visit
Admission: RE | Admit: 2011-10-25 | Discharge: 2011-10-25 | Disposition: A | Discharge status: Home / Self Care | Payer: BC Managed Care – PPO | Source: Ambulatory Visit | Attending: Radiation Oncology | Admitting: Radiation Oncology

## 2011-10-27 ENCOUNTER — Ambulatory Visit
Admission: RE | Admit: 2011-10-27 | Discharge: 2011-10-27 | Disposition: A | Discharge status: Home / Self Care | Payer: BC Managed Care – PPO | Source: Ambulatory Visit | Attending: Radiation Oncology | Admitting: Radiation Oncology

## 2011-10-27 ENCOUNTER — Encounter: Payer: Self-pay | Admitting: Radiation Oncology

## 2011-10-27 VITALS — Wt 175.3 lb

## 2011-10-27 DIAGNOSIS — C20 Malignant neoplasm of rectum: Secondary | ICD-10-CM

## 2011-10-27 NOTE — Progress Notes (Signed)
Pt reports bowel urgency feelings but then does not have bm or very little. Also reports intermittent rectal pain, Aleve prn w/fair relief. These bm urges usually occur after meals. Denies skin irritation. States appetite improved, less fatigue today. Pt did not work last Friday. Pt requests script for pain med today.

## 2011-10-27 NOTE — Progress Notes (Signed)
  Radiation Oncology         [phone] ________________________________  Name: [name] [name] MRN: [id number]  Date: 10/27/2011  DOB: February 17, 1951  Weekly Radiation Therapy Management  Current Dose: 39.6 Gy     Planned Dose:  50.4 Gy  Narrative . . . . . . . . The patient presents for routine under treatment assessment.                                                     The patient is without complaint. The patient describes some rectal obstipation. She denies any diarrhea. He denies any bladder symptoms. His weight today is 175.3 pounds reflecting a 2 pound weight loss.                                 Set-up films were reviewed.                                 The chart was checked. Physical Findings. . . Weight essentially stable.  No significant changes. Impression . . . . . . . The patient is  tolerating radiation. Plan . . . . . . . . . . . . Continue treatment as planned.  ________________________________  Artist Pais. Kathrynn Running, M.D.

## 2011-10-28 ENCOUNTER — Other Ambulatory Visit: Payer: Self-pay | Admitting: Oncology

## 2011-10-28 ENCOUNTER — Ambulatory Visit
Admission: RE | Admit: 2011-10-28 | Discharge: 2011-10-28 | Disposition: A | Discharge status: Home / Self Care | Payer: BC Managed Care – PPO | Source: Ambulatory Visit | Attending: Radiation Oncology | Admitting: Radiation Oncology

## 2011-10-28 ENCOUNTER — Ambulatory Visit (HOSPITAL_BASED_OUTPATIENT_CLINIC_OR_DEPARTMENT_OTHER): Payer: BC Managed Care – PPO | Admitting: Oncology

## 2011-10-28 ENCOUNTER — Encounter: Payer: Self-pay | Admitting: Oncology

## 2011-10-28 ENCOUNTER — Telehealth: Payer: Self-pay | Admitting: Oncology

## 2011-10-28 ENCOUNTER — Other Ambulatory Visit: Payer: Self-pay | Admitting: *Deleted

## 2011-10-28 ENCOUNTER — Other Ambulatory Visit (HOSPITAL_BASED_OUTPATIENT_CLINIC_OR_DEPARTMENT_OTHER): Payer: BC Managed Care – PPO | Admitting: Lab

## 2011-10-28 VITALS — BP 118/70 | HR 97 | Temp 97.5°F | Ht 70.0 in | Wt 174.2 lb

## 2011-10-28 DIAGNOSIS — L819 Disorder of pigmentation, unspecified: Secondary | ICD-10-CM

## 2011-10-28 DIAGNOSIS — K7689 Other specified diseases of liver: Secondary | ICD-10-CM

## 2011-10-28 DIAGNOSIS — C20 Malignant neoplasm of rectum: Secondary | ICD-10-CM

## 2011-10-28 DIAGNOSIS — R152 Fecal urgency: Secondary | ICD-10-CM

## 2011-10-28 LAB — CBC WITH DIFFERENTIAL/PLATELET
BASO%: 0.2 % (ref 0.0–2.0)
EOS%: 10.6 % — ABNORMAL HIGH (ref 0.0–7.0)
MCH: 36.1 pg — ABNORMAL HIGH (ref 27.2–33.4)
MCHC: 34.9 g/dL (ref 32.0–36.0)
RDW: 12.6 % (ref 11.0–14.6)
lymph#: 0.4 10*3/uL — ABNORMAL LOW (ref 0.9–3.3)

## 2011-10-28 MED ORDER — HYDROCODONE-ACETAMINOPHEN 5-500 MG PO TABS: 1.0000 | ORAL_TABLET | Freq: Four times a day (QID) | ORAL | Status: DC | PRN

## 2011-10-28 MED ORDER — CAPECITABINE 500 MG PO TABS: 1500.0000 mg | ORAL_TABLET | Freq: Two times a day (BID) | ORAL | Status: DC

## 2011-10-28 NOTE — Progress Notes (Signed)
OFFICE PROGRESS NOTE   INTERVAL HISTORY:   [name] [name] returns as scheduled. He continues daily Xeloda and concurrent radiation. He denies mouth sores. He reports only a few episodes of diarrhea and one episode of emesis. There is no pain or erythema at the hands and feet. He has noted hyperpigmentation at the hands. His chief complaint is rectal urgency. The rectal pain improved after 2 weeks of chemotherapy and radiation, but has returned. He is scheduled to complete radiation next week.  Objective:  Vital signs in last 24 hours:  Blood pressure 118/70, pulse 97, temperature 97.5 F (36.4 C), temperature source Oral, height 5\' 10"  (1.778 m), weight 174 lb 3.2 oz (79.017 kg).    HEENT: No thrush or ulcers Resp: Lungs clear bilaterally Cardio: Regular rate and rhythm GI: Abdomen-nontender. Vascular: No leg edema  Skin: Mild hyperpigmentation at the hands. Skin thickening of the palms. No erythema or skin breakdown at the hands or feet. Mild hyperpigmentation of the perineum. No skin breakdown.    Lab Results:  CBC  Lab Results  Component Value Date   WBC 5.6 10/28/2011   HGB 11.5* 10/28/2011   HCT 33.0* 10/28/2011   MCV 103.4* 10/28/2011   PLT 265 10/28/2011    Chemistry:      Component Value Date/Time   NA 142 09/17/2011 1231   K 3.6 09/17/2011 1231   CL 105 09/17/2011 1231   CO2 23 09/17/2011 1231   GLUCOSE 129* 09/17/2011 1231   BUN 28* 09/17/2011 1231   CREATININE 1.47* 09/17/2011 1231   CALCIUM 9.3 09/17/2011 1231   PROT 6.6 09/17/2011 1231   ALBUMIN 4.1 09/17/2011 1231   AST 14 09/17/2011 1231   ALT 12 09/17/2011 1231   ALKPHOS 121* 09/17/2011 1231   BILITOT 0.6 09/17/2011 1231   GFRNONAA 75.42 06/24/2010 1013   GFRAA 67 06/05/2008 1150       Medications: I have reviewed the patient's current medications.  Assessment/Plan: 1. Metastatic rectal cancer status post biopsy of rectal mass 09/03/2011 with pathology showing invasive adenocarcinoma.  Staging  CT scans 09/03/2011 showed a 10 mm hypodense rounded lesion in left lateral hepatic lobe, similar 6 mm lesion in the superior right hepatic lobe and a smaller subcapsular lesion in the lateral right hepatic lobe; rectal mass within the lumen of the bowel emanating from the left wall measuring 4.9 x 3.5 cm and a mass within the low left perirectal fat measuring 2.3 x 3.2 cm consistent with local extension of carcinoma.  Staging PET scan 09/18/2011 showed a 5.1 cm distal sigmoid/rectal mass with max SUV 19.2 with local extension into the left perirectal fat with max SUV 7.7.  At least 2 suspected hepatic metastases with max SUV 6.7 in the lateral segment left hepatic lobe and max SUV 4.1 in the lateral right hepatic dome.  Additional tiny hypodensities in the liver may be cysts; patchy/nodular opacity in the left lower lobe with max SUV 3.5 worrisome for pulmonary metastasis, less likely infectious.  He began radiation and concurrent Xeloda chemotherapy on 09/29/2011. 2. Rectal pain and bleeding secondary to #1, improved. 3. Frequent loose stools secondary to #1. 4. Hypertension. 5. Tobacco use.     Disposition:  Mr. Stegman continues chemotherapy and radiation. He is scheduled to complete radiation on November 29. He will run out of Xeloda after tomorrow's dose. We will prescribe for additional days of Xeloda to be given concurrent with radiation November 26 through November 29.  We will refer him back to Dr.  Ezzard Standing for surgery planning. I will present his case at the GI tumor conference within the next few weeks.   Lucile Shutters, MD  10/28/2011  12:56 PM

## 2011-10-28 NOTE — Telephone Encounter (Signed)
gve the pt his dec 2012 appt calendar

## 2011-10-29 ENCOUNTER — Ambulatory Visit: Payer: BC Managed Care – PPO

## 2011-10-29 ENCOUNTER — Other Ambulatory Visit: Payer: Self-pay | Admitting: Internal Medicine

## 2011-10-31 ENCOUNTER — Ambulatory Visit: Payer: BC Managed Care – PPO

## 2011-11-01 ENCOUNTER — Encounter (HOSPITAL_COMMUNITY): Payer: Self-pay | Admitting: *Deleted

## 2011-11-01 ENCOUNTER — Emergency Department (HOSPITAL_COMMUNITY)
Admission: EM | Admit: 2011-11-01 | Discharge: 2011-11-01 | Disposition: A | Discharge status: Home / Self Care | Payer: BC Managed Care – PPO | Attending: Emergency Medicine | Admitting: Emergency Medicine

## 2011-11-01 ENCOUNTER — Other Ambulatory Visit: Payer: Self-pay

## 2011-11-01 DIAGNOSIS — F17200 Nicotine dependence, unspecified, uncomplicated: Secondary | ICD-10-CM | POA: Insufficient documentation

## 2011-11-01 DIAGNOSIS — Z79899 Other long term (current) drug therapy: Secondary | ICD-10-CM | POA: Insufficient documentation

## 2011-11-01 DIAGNOSIS — I10 Essential (primary) hypertension: Secondary | ICD-10-CM | POA: Insufficient documentation

## 2011-11-01 DIAGNOSIS — Z923 Personal history of irradiation: Secondary | ICD-10-CM | POA: Insufficient documentation

## 2011-11-01 DIAGNOSIS — E876 Hypokalemia: Secondary | ICD-10-CM | POA: Insufficient documentation

## 2011-11-01 DIAGNOSIS — R112 Nausea with vomiting, unspecified: Secondary | ICD-10-CM | POA: Insufficient documentation

## 2011-11-01 DIAGNOSIS — C20 Malignant neoplasm of rectum: Secondary | ICD-10-CM | POA: Insufficient documentation

## 2011-11-01 DIAGNOSIS — R197 Diarrhea, unspecified: Secondary | ICD-10-CM

## 2011-11-01 DIAGNOSIS — R9431 Abnormal electrocardiogram [ECG] [EKG]: Secondary | ICD-10-CM

## 2011-11-01 HISTORY — DX: Abnormal electrocardiogram (ECG) (EKG): R94.31

## 2011-11-01 LAB — DIFFERENTIAL
Basophils Relative: 0 % (ref 0–1)
Lymphs Abs: 0.3 10*3/uL — ABNORMAL LOW (ref 0.7–4.0)
Monocytes Relative: 10 % (ref 3–12)
Neutro Abs: 6.8 10*3/uL (ref 1.7–7.7)
Neutrophils Relative %: 85 % — ABNORMAL HIGH (ref 43–77)

## 2011-11-01 LAB — CBC
Hemoglobin: 12.6 g/dL — ABNORMAL LOW (ref 13.0–17.0)
MCHC: 37 g/dL — ABNORMAL HIGH (ref 30.0–36.0)
RBC: 3.5 MIL/uL — ABNORMAL LOW (ref 4.22–5.81)

## 2011-11-01 LAB — HEPATIC FUNCTION PANEL
ALT: 13 U/L (ref 0–53)
AST: 21 U/L (ref 0–37)
Albumin: 3.6 g/dL (ref 3.5–5.2)
Bilirubin, Direct: 0.5 mg/dL — ABNORMAL HIGH (ref 0.0–0.3)
Total Protein: 6.7 g/dL (ref 6.0–8.3)

## 2011-11-01 LAB — POCT I-STAT, CHEM 8
BUN: 35 mg/dL — ABNORMAL HIGH (ref 6–23)
Chloride: 100 mEq/L (ref 96–112)
Creatinine, Ser: 1.3 mg/dL (ref 0.50–1.35)
Glucose, Bld: 130 mg/dL — ABNORMAL HIGH (ref 70–99)
Potassium: 2.8 mEq/L — ABNORMAL LOW (ref 3.5–5.1)

## 2011-11-01 LAB — BASIC METABOLIC PANEL
BUN: 44 mg/dL — ABNORMAL HIGH (ref 6–23)
Chloride: 93 mEq/L — ABNORMAL LOW (ref 96–112)
GFR calc Af Amer: 54 mL/min — ABNORMAL LOW (ref >90)
Potassium: 2.6 mEq/L — CL (ref 3.5–5.1)
Sodium: 136 mEq/L (ref 135–145)

## 2011-11-01 MED ORDER — MAGNESIUM SULFATE 50 % IJ SOLN: 2.0000 g | Freq: Once | INTRAMUSCULAR | Status: DC

## 2011-11-01 MED ORDER — POTASSIUM CHLORIDE CRYS ER 20 MEQ PO TBCR
40.0000 meq | EXTENDED_RELEASE_TABLET | Freq: Once | ORAL | Status: AC
  Administered 2011-11-01: 40 meq via ORAL
  Filled 2011-11-01: qty 2

## 2011-11-01 MED ORDER — SODIUM CHLORIDE 0.9 % IV SOLN
Freq: Once | INTRAVENOUS | Status: AC
  Administered 2011-11-01: 14:00:00 via INTRAVENOUS

## 2011-11-01 MED ORDER — POTASSIUM CHLORIDE 10 MEQ/100ML IV SOLN
10.0000 meq | INTRAVENOUS | Status: AC
  Administered 2011-11-01 (×2): 10 meq via INTRAVENOUS
  Filled 2011-11-01 (×2): qty 100

## 2011-11-01 MED ORDER — POTASSIUM CHLORIDE ER 10 MEQ PO TBCR: 20.0000 meq | EXTENDED_RELEASE_TABLET | Freq: Two times a day (BID) | ORAL | Status: DC

## 2011-11-01 MED ORDER — ONDANSETRON HCL 4 MG/2ML IJ SOLN
4.0000 mg | Freq: Once | INTRAMUSCULAR | Status: AC
  Administered 2011-11-01: 4 mg via INTRAVENOUS
  Filled 2011-11-01: qty 2

## 2011-11-01 MED ORDER — MAGNESIUM SULFATE 40 MG/ML IJ SOLN
2.0000 g | INTRAMUSCULAR | Status: AC
  Administered 2011-11-01: 2 g via INTRAVENOUS
  Filled 2011-11-01: qty 50

## 2011-11-01 MED ORDER — PROMETHAZINE HCL 25 MG PO TABS: 25.0000 mg | ORAL_TABLET | Freq: Four times a day (QID) | ORAL | Status: AC | PRN

## 2011-11-01 NOTE — ED Provider Notes (Signed)
History     CSN: [id number] Arrival date & time: 11/01/2011 12:29 PM   First MD Initiated Contact with Patient 11/01/11 1333      Chief Complaint  Patient presents with  . Nausea  . Diarrhea  . Dizziness    (Consider location/radiation/quality/duration/timing/severity/associated sxs/prior treatment) HPI Comments: Patient has history of rectal cancer diagnosed in 09/2011.  Is being treated with chemo and radiation.  For the past two days, patient has had n/v/d.  Feels dehydrated.  Last urinated 4 hours ago.  Patient is a 60 y.o. male presenting with diarrhea. The history is provided by the patient.  Diarrhea The primary symptoms include nausea, vomiting and diarrhea. Primary symptoms do not include fever. The illness began 2 days ago. The problem has been gradually worsening.  The illness does not include chills or anorexia.    Past Medical History  Diagnosis Date  . Hypertension   . Abnormal EKG   . Arthritis   . Chronic bronchitis   . Diarrhea   . Rectal pain   . Nasal congestion   . Blood in stool   . Dyspnea     intermittent with bronchitis  . Hydronephrosis of right kidney     Chronic  . Cancer     Past Surgical History  Procedure Date  . Wrist and hand surgery   . Right hand surgery -repair fracture   . Removal ganglion cyst     Family History  Problem Relation Age of Onset  . Lupus    . Anemia Mother   . Hypertension Father   . Kidney disease Father 79    renal failure  . Lupus Sister   . Lupus Brother     History  Substance Use Topics  . Smoking status: Current Everyday Smoker -- 1.5 packs/day for 46 years    Types: Cigarettes  . Smokeless tobacco: Never Used  . Alcohol Use: 0.0 oz/week     occasional - rare      Review of Systems  Constitutional: Negative for fever and chills.  Gastrointestinal: Positive for nausea, vomiting and diarrhea. Negative for anorexia.  All other systems reviewed and are negative.    Allergies  Review of  patient's allergies indicates no known allergies.  Home Medications   Current Outpatient Rx  Name Route Sig Dispense Refill  . AMLODIPINE BESYLATE 5 MG PO TABS  TAKE 1 TABLET DAILY 90 tablet 1  . BISOPROLOL-HYDROCHLOROTHIAZIDE 5-6.25 MG PO TABS  TAKE 1 TABLET DAILY 90 tablet 1  . CAPECITABINE 500 MG PO TABS Oral Take 3 tablets (1,500 mg total) by mouth 2 (two) times daily after a meal. 30 tablet 0  . NAPROXEN SODIUM 220 MG PO CAPS Oral Take 1-2 capsules by mouth every 12 (twelve) hours as needed. For pain.      BP 93/62  Pulse 115  Temp(Src) 98.3 F (36.8 C) (Oral)  Resp 20  SpO2 94%  Physical Exam  Nursing note and vitals reviewed. Constitutional: He is oriented to person, place, and time. He appears well-developed and well-nourished. No distress.  HENT:  Head: Normocephalic and atraumatic.  Neck: Normal range of motion. Neck supple.  Cardiovascular: Normal rate and regular rhythm.  Exam reveals no friction rub.   No murmur heard. Pulmonary/Chest: Effort normal and breath sounds normal. He has no wheezes.  Abdominal: Soft. Bowel sounds are normal. He exhibits no distension. There is no tenderness.  Musculoskeletal: Normal range of motion. He exhibits no edema.  Neurological: He is alert  and oriented to person, place, and time.  Skin: Skin is warm and dry. He is not diaphoretic.    ED Course  Procedures (including critical care time)   Labs Reviewed  CBC   No results found.   No diagnosis found.    MDM  Sounds like gastroenteritis.  Spoke with Dr. Shirline Frees who does not believe this to be related to the chemo.  He counts are okay, but lytes show low potassium and bun and cr are slightly elevated.  Gave NS, now will give KCl in ns times two along with zofran.  Will discharge to home after ivfs and kcl.  Care signed out to Dr. Bebe Shaggy.          Geoffery Lyons, MD 11/01/11 [phone]

## 2011-11-01 NOTE — ED Notes (Signed)
Pt undergoing chemo and radiation. Last treatment Monday, now has nausea, vomiting, diarrhea and dizziness

## 2011-11-01 NOTE — ED Notes (Signed)
Patient is resting comfortably. 

## 2011-11-01 NOTE — ED Provider Notes (Signed)
Pt feeling improved abd soft Will check EKG, replenish electrolytes, and anticipate discharge home  5:46 PM  Date: 11/01/2011  Rate: 90  Rhythm: normal sinus rhythm  QRS Axis: right  Intervals: QT prolonged no U waves  ST/T Wave abnormalities: nonspecific ST changes  Conduction Disutrbances:none  Narrative Interpretation:   Old EKG Reviewed: none available at time of interpretation   10:33 PM PT WITHOUT VOMITING HYPOK MILDLY IMPROVED QT NARROWED ON EKG HE WANTS TO HOME HOLD HCTZ FOLLOWUP WITH DOCTOR IN 2 DAYS FOR RECHECK  Joya Gaskins, MD 11/01/11 2234

## 2011-11-03 ENCOUNTER — Other Ambulatory Visit: Payer: Self-pay | Admitting: Oncology

## 2011-11-03 ENCOUNTER — Telehealth: Payer: Self-pay | Admitting: *Deleted

## 2011-11-03 ENCOUNTER — Ambulatory Visit
Admission: RE | Admit: 2011-11-03 | Discharge: 2011-11-03 | Disposition: A | Discharge status: Home / Self Care | Payer: BC Managed Care – PPO | Source: Ambulatory Visit | Attending: Radiation Oncology | Admitting: Radiation Oncology

## 2011-11-03 ENCOUNTER — Ambulatory Visit (HOSPITAL_BASED_OUTPATIENT_CLINIC_OR_DEPARTMENT_OTHER): Payer: BC Managed Care – PPO

## 2011-11-03 DIAGNOSIS — C20 Malignant neoplasm of rectum: Secondary | ICD-10-CM

## 2011-11-03 LAB — BASIC METABOLIC PANEL
Calcium: 8.5 mg/dL (ref 8.4–10.5)
Glucose, Bld: 131 mg/dL — ABNORMAL HIGH (ref 70–99)
Potassium: 2.7 mEq/L — CL (ref 3.5–5.3)
Sodium: 138 mEq/L (ref 135–145)

## 2011-11-03 NOTE — Telephone Encounter (Signed)
S/w the pt and he is aware of his lab appt for today

## 2011-11-03 NOTE — Telephone Encounter (Signed)
Was in ER over weekend with nausea, vomiting and diarrhea. Received IV fluids and Zofran and IV K+. Was given scripts for K+ replacement and Phenergan that he has not yet filled. He has less nausea today & is able to take po fluids as of last night and today. Has had #6 small loose stools since 12 midnight. Did go to radiation therapy today and was told to take Imodium for diarrhea--had tried to do this over weekend, but he could not keep them down. He also adds that "my esophagus is burning from vomiting".  Instructed him to hold his Xeloda today. Get his Phenergan filled and get this in him so he can then take the Imodium. Start the K+ supplement when nausea is better. Will check stat B- met today.

## 2011-11-04 ENCOUNTER — Telehealth: Payer: Self-pay | Admitting: *Deleted

## 2011-11-04 ENCOUNTER — Ambulatory Visit
Admission: RE | Admit: 2011-11-04 | Discharge: 2011-11-04 | Disposition: A | Discharge status: Home / Self Care | Payer: BC Managed Care – PPO | Source: Ambulatory Visit | Attending: Radiation Oncology | Admitting: Radiation Oncology

## 2011-11-04 DIAGNOSIS — C20 Malignant neoplasm of rectum: Secondary | ICD-10-CM

## 2011-11-04 DIAGNOSIS — R12 Heartburn: Secondary | ICD-10-CM

## 2011-11-04 NOTE — Telephone Encounter (Signed)
Left voice mail for patient to have labs drawn on 11/28 after his radiation appointment.

## 2011-11-05 ENCOUNTER — Encounter: Payer: Self-pay | Admitting: Radiation Oncology

## 2011-11-05 ENCOUNTER — Other Ambulatory Visit: Payer: Self-pay | Admitting: *Deleted

## 2011-11-05 ENCOUNTER — Ambulatory Visit
Admission: RE | Admit: 2011-11-05 | Discharge: 2011-11-05 | Disposition: A | Discharge status: Home / Self Care | Payer: BC Managed Care – PPO | Source: Ambulatory Visit | Attending: Radiation Oncology | Admitting: Radiation Oncology

## 2011-11-05 ENCOUNTER — Other Ambulatory Visit: Payer: BC Managed Care – PPO | Admitting: Lab

## 2011-11-05 DIAGNOSIS — C20 Malignant neoplasm of rectum: Secondary | ICD-10-CM

## 2011-11-05 LAB — BASIC METABOLIC PANEL
CO2: 27 mEq/L (ref 19–32)
Calcium: 8.6 mg/dL (ref 8.4–10.5)
Potassium: 2.6 mEq/L — CL (ref 3.5–5.3)
Sodium: 139 mEq/L (ref 135–145)

## 2011-11-05 MED ORDER — PANTOPRAZOLE SODIUM 40 MG PO TBEC: 40.0000 mg | DELAYED_RELEASE_TABLET | Freq: Every day | ORAL | Status: DC

## 2011-11-05 NOTE — Telephone Encounter (Signed)
Spoke with patient again today--diarrhea and nausea are resolved. He is taking po's well. Has only taken one dose of K+ thus far. Also reports burning sensation in his stomach and mid chest area. OTC antacid helped briefly. Per Dr. Truett Perna: May try Protonix 40 mg daily. Resume Xeloda today. Continue K+ as ordered. Take Imodium at 1st sign of diarrhea. Will recheck Bmet on 11/30 after his radiation tx. Patient understands and agrees.

## 2011-11-06 ENCOUNTER — Telehealth: Payer: Self-pay | Admitting: *Deleted

## 2011-11-06 ENCOUNTER — Ambulatory Visit
Admission: RE | Admit: 2011-11-06 | Discharge: 2011-11-06 | Disposition: A | Discharge status: Home / Self Care | Payer: BC Managed Care – PPO | Source: Ambulatory Visit | Attending: Radiation Oncology | Admitting: Radiation Oncology

## 2011-11-06 DIAGNOSIS — C20 Malignant neoplasm of rectum: Secondary | ICD-10-CM

## 2011-11-06 NOTE — Telephone Encounter (Signed)
Left message for pt to call office. Per Dr. Truett Perna, continue Potassium as prescribed. Will recheck on 11/30

## 2011-11-06 NOTE — Progress Notes (Signed)
Pt over thanksgiving was sick stated,nausea,vomiting and diarrhea, appetite just now coming back loss 9 lbs since 10/27/11, pt tried pt will get boost stated, suggessted to make shakes with banans and pnut butter to get extra calories, pt likes strawberry, not eaten this am yet, pt also has protonix now  New rx  Which states" that has helped my heartburn, will restart xeloda today, took ortho vitals, sitting=169/83,p=88,rr=20, standing 134/82,pulse=98,rr=20, potassium low, taking last 2 today, diarrhea has stopped with imodium, "it's gone", pt denies any pain at present 8:05 AM

## 2011-11-06 NOTE — Telephone Encounter (Signed)
Message copied by Caleb Popp on Thu Nov 06, 2011  9:24 AM ------      Message from: Thornton Papas B      Created: Wed Nov 05, 2011  6:31 PM       Please call patient, take kcl as prescribed, check bmet 11/30-  I think Darl Pikes did this

## 2011-11-06 NOTE — Progress Notes (Signed)
[facility] Radiation Oncology Weekly Treatment Note    Name: [name] [name] Date: 11/06/2011 MRN: [id number] DOB: 1951/06/19  Status:outpatient    Current dose: 4860  Current fraction:27  Planned dose:5040  Planned fraction:28   MEDICATIONS: Current Outpatient Prescriptions  Medication Sig Dispense Refill  . amLODipine (NORVASC) 5 MG tablet TAKE 1 TABLET DAILY  90 tablet  1  . capecitabine (XELODA) 500 MG tablet Take 3 tablets (1,500 mg total) by mouth 2 (two) times daily after a meal.  30 tablet  0  . Naproxen Sodium (ALEVE) 220 MG CAPS Take 1-2 capsules by mouth every 12 (twelve) hours as needed. For pain.      . pantoprazole (PROTONIX) 40 MG tablet Take 1 tablet (40 mg total) by mouth daily.  30 tablet  1  . potassium chloride (K-DUR) 10 MEQ tablet Take 2 tablets (20 mEq total) by mouth 2 (two) times daily.  10 tablet  0  . promethazine (PHENERGAN) 25 MG tablet Take 1 tablet (25 mg total) by mouth every 6 (six) hours as needed for nausea.  15 tablet  0     ALLERGIES: Review of patient's allergies indicates no known allergies.   LABORATORY DATA:  Lab Results  Component Value Date   WBC 8.0 11/01/2011   HGB 9.2* 11/01/2011   HCT 27.0* 11/01/2011   MCV 97.4 11/01/2011   PLT 302 11/01/2011   Lab Results  Component Value Date   NA 139 11/05/2011   K 2.6* 11/05/2011   CL 102 11/05/2011   CO2 27 11/05/2011   Lab Results  Component Value Date   ALT 13 11/01/2011   AST 21 11/01/2011   ALKPHOS 82 11/01/2011   BILITOT 1.5* 11/01/2011      NARRATIVE: Lemar Bakos was seen today for weekly treatment management. The chart was checked and port films images were reviewed. Pt had n/v, and diarrhea over Thanksgiving. Better now, with appetite having returned some. One more fraction tomorrow.  PHYSICAL EXAMINATION: weight is 165 lb 14.4 oz (75.252 kg). His oral temperature is 98.1 F (36.7 C). His blood pressure is 134/82 and his pulse is 90. His  respiration is 20.      ASSESSMENT: Patient tolerating treatments well. Some problems last week - but now much improved.   PLAN: Continue treatment as planned. Will f/u Dr. Kathrynn Running in 1 month.

## 2011-11-07 ENCOUNTER — Ambulatory Visit: Payer: BC Managed Care – PPO | Admitting: Oncology

## 2011-11-07 ENCOUNTER — Ambulatory Visit
Admission: RE | Admit: 2011-11-07 | Discharge: 2011-11-07 | Disposition: A | Discharge status: Home / Self Care | Payer: BC Managed Care – PPO | Source: Ambulatory Visit | Attending: Radiation Oncology | Admitting: Radiation Oncology

## 2011-11-07 ENCOUNTER — Other Ambulatory Visit: Payer: Self-pay | Admitting: *Deleted

## 2011-11-07 ENCOUNTER — Other Ambulatory Visit: Payer: BC Managed Care – PPO | Admitting: Lab

## 2011-11-07 DIAGNOSIS — C20 Malignant neoplasm of rectum: Secondary | ICD-10-CM

## 2011-11-07 NOTE — Progress Notes (Signed)
Patient was "No show" for labs today. Will reschedule for Monday.

## 2011-11-08 ENCOUNTER — Telehealth: Payer: Self-pay | Admitting: Oncology

## 2011-11-08 NOTE — Telephone Encounter (Signed)
lmonvm adviisng the pt of his lab appt on Monday@8 :45am

## 2011-11-10 ENCOUNTER — Other Ambulatory Visit: Payer: BC Managed Care – PPO | Admitting: Lab

## 2011-11-14 ENCOUNTER — Other Ambulatory Visit: Payer: Self-pay | Admitting: *Deleted

## 2011-11-17 ENCOUNTER — Encounter: Payer: Self-pay | Admitting: Radiation Oncology

## 2011-11-17 NOTE — Procedures (Signed)
DIAGNOSIS:  Rectal cancer.  NARRATIVE:  [name] [name] presented for port films prior to beginning his course of radiation treatment to the pelvis.  A shift was requested with respect to the isocenter placement.  Otherwise, the blocks appropriately delineated the target treatment region.  Therefore, we will make this shift and proceed with his treatment as planned.    ______________________________ Radene Gunning, M.D., Ph.D. JSM/MEDQ  D:  11/16/2011  T:  11/17/2011  Job:  [id number]

## 2011-11-17 NOTE — Progress Notes (Signed)
[facility] Radiation Oncology Simulation Verification Note   Name: [name] [name] MRN: [id number]  Date: 11/17/2011  DOB: Oct 28, 1951  Status:outpatient      POSITION: Patient is positioned prone and  Isocenter and [facility] shapes were reviewed for the 3D boost. Treatment was approved.

## 2011-11-17 NOTE — Progress Notes (Signed)
This office note has been dictated.  EOT

## 2011-11-17 NOTE — Progress Notes (Signed)
[facility] Radiation Oncology Simulation and Treatment Planning Note   Name: [name] [name] MRN: [id number]  Date: 11/17/2011  DOB: 01-09-1951  Status:outpatient    DIAGNOSIS: There were no encounter diagnoses.     CONSENT VERIFIED:yes   SET UP: Patient is setup prone and belly board   NARRATIVE:The patient was brought to the CT Simulation planning suite.  Identity was confirmed.  All relevant records and images related to the planned course of therapy were reviewed.  Then, the patient was positioned in a stable reproducible clinical set-up for radiation therapy.  CT images were obtained.  Skin markings were placed.  The CT images were loaded into the planning software where the target and avoidance structures were contoured.  The radiation prescription was entered and confirmed.   TREATMENT PLANNING NOTE:  Treatment planning then occurred.  Four complex treatment devices were fabricated to shape radiation around the pelvis from ant, post, right, and left gantry angles using MLCs. I have requested : Isodose Plan  SPECIAL TREATMENT PROCEDURE: He'll receive concurrent chemo which increases toxicity.  So, I have ordered:Nutrition Consult and CBC   ------------------------------------------------  Artist Pais. Kathrynn Running, M.D.

## 2011-11-19 ENCOUNTER — Other Ambulatory Visit: Payer: Self-pay | Admitting: Oncology

## 2011-11-20 ENCOUNTER — Other Ambulatory Visit (HOSPITAL_BASED_OUTPATIENT_CLINIC_OR_DEPARTMENT_OTHER): Payer: BC Managed Care – PPO

## 2011-11-20 ENCOUNTER — Ambulatory Visit (HOSPITAL_BASED_OUTPATIENT_CLINIC_OR_DEPARTMENT_OTHER): Payer: BC Managed Care – PPO | Admitting: Nurse Practitioner

## 2011-11-20 VITALS — BP 136/83 | HR 85 | Temp 99.2°F | Ht 70.0 in | Wt 170.0 lb

## 2011-11-20 DIAGNOSIS — E876 Hypokalemia: Secondary | ICD-10-CM

## 2011-11-20 DIAGNOSIS — C20 Malignant neoplasm of rectum: Secondary | ICD-10-CM

## 2011-11-20 DIAGNOSIS — C19 Malignant neoplasm of rectosigmoid junction: Secondary | ICD-10-CM

## 2011-11-20 DIAGNOSIS — K7689 Other specified diseases of liver: Secondary | ICD-10-CM

## 2011-11-20 DIAGNOSIS — R944 Abnormal results of kidney function studies: Secondary | ICD-10-CM

## 2011-11-20 LAB — BASIC METABOLIC PANEL
CO2: 29 mEq/L (ref 19–32)
Chloride: 105 mEq/L (ref 96–112)
Creatinine, Ser: 1.74 mg/dL — ABNORMAL HIGH (ref 0.50–1.35)
Glucose, Bld: 116 mg/dL — ABNORMAL HIGH (ref 70–99)

## 2011-11-20 NOTE — Progress Notes (Signed)
OFFICE PROGRESS NOTE  Interval history:  [name] [name] returns as scheduled. He completed radiation 11/06/2011. He is no longer having diarrhea. He denies rectal bleeding. He continues to have rectal pain. He is taking Vicodin without relief. He denies nausea or vomiting. No mouth sores   Objective: Blood pressure 136/83, pulse 85, temperature 99.2 F (37.3 C), temperature source Oral, height 5\' 10"  (1.778 m), weight 170 lb (77.111 kg).  Oropharynx is without thrush or ulceration. Lungs are clear. No wheezes or rales. Regular cardiac rhythm. Abdomen is soft and nontender. No hepatomegaly. Extremities are without edema. No erythema or skin breakdown at the perineum/perianal region.   Lab Results: Lab Results  Component Value Date   WBC 8.0 11/01/2011   HGB 9.2* 11/01/2011   HCT 27.0* 11/01/2011   MCV 97.4 11/01/2011   PLT 302 11/01/2011    Chemistry:     Studies/Results: No results found.  Medications: I have reviewed the patient's current medications.  Assessment/Plan:  1. Metastatic rectal cancer status post biopsy of rectal mass 09/03/2011 with pathology showing invasive adenocarcinoma. Staging CT scans 09/03/2011 showed a 10 mm hypodense rounded lesion in left lateral hepatic lobe, similar 6 mm lesion in the superior right hepatic lobe and a smaller subcapsular lesion in the lateral right hepatic lobe; rectal mass within the lumen of the bowel emanating from the left wall measuring 4.9 x 3.5 cm and a mass within the low left perirectal fat measuring 2.3 x 3.2 cm consistent with local extension of carcinoma. Staging PET scan 09/18/2011 showed a 5.1 cm distal sigmoid/rectal mass with max SUV 19.2 with local extension into the left perirectal fat with max SUV 7.7. At least 2 suspected hepatic metastases with max SUV 6.7 in the lateral segment left hepatic lobe and max SUV 4.1 in the lateral right hepatic dome. Additional tiny hypodensities in the liver may be cysts; patchy/nodular  opacity in the left lower lobe with max SUV 3.5 worrisome for pulmonary metastasis, less likely infectious. He began radiation and concurrent Xeloda chemotherapy on 09/29/2011. He completed radiation on 11/06/2011. 2. Rectal pain and bleeding secondary to #1. The bleeding has resolved. He continues to experience rectal pain. He was given a prescription for Percocet 5/325 one to 2 tablets every 6 hours as needed. 3. Frequent loose stools secondary to #1. Improved. 4. Hypertension. 5. Tobacco use. 6. CT scan 09/03/2011 with chronic right hydronephrosis secondary to an obstructing calculus in the mid right ureter. He is followed by [name] [name]. 7. Elevated creatinine. Question secondary to dehydration. He will push fluids over the next several days. We will repeat a basic metabolic panel on 11/24/2011. If the creatinine remains elevated we will refer him for a renal ultrasound. 8. Hypokalemia-he will continue potassium as he is currently taking. We will followup on the basic metabolic panel 11/24/2011.  Disposition-[name] [name] completed concurrent radiation and Xeloda chemotherapy 11/06/2011. [name] [name] will present his case at the upcoming GI tumor conference. We will schedule him for restaging CT scans pending the creatinine result on 11/24/2011. We are referring him back to [name] [name].  Plan reviewed with [name] [name].    [name] [name] ANP/GNP-BC

## 2011-11-24 ENCOUNTER — Other Ambulatory Visit: Payer: Self-pay

## 2011-11-24 ENCOUNTER — Other Ambulatory Visit (HOSPITAL_BASED_OUTPATIENT_CLINIC_OR_DEPARTMENT_OTHER): Payer: BC Managed Care – PPO | Admitting: Lab

## 2011-11-24 DIAGNOSIS — C20 Malignant neoplasm of rectum: Secondary | ICD-10-CM

## 2011-11-24 LAB — BASIC METABOLIC PANEL
BUN: 28 mg/dL — ABNORMAL HIGH (ref 6–23)
CO2: 26 mEq/L (ref 19–32)
Calcium: 8.9 mg/dL (ref 8.4–10.5)
Chloride: 106 mEq/L (ref 96–112)
Creatinine, Ser: 1.42 mg/dL — ABNORMAL HIGH (ref 0.50–1.35)

## 2011-11-24 MED ORDER — POTASSIUM CHLORIDE ER 10 MEQ PO TBCR: 20.0000 meq | EXTENDED_RELEASE_TABLET | Freq: Two times a day (BID) | ORAL | Status: DC

## 2011-11-24 NOTE — Telephone Encounter (Signed)
Message left for pt to contact our office for lab results (BMET) today.    Per Dr Truett Perna, continue K-Tab bid. Recheck BMET on 12/24.   Refill e-prescribed to [facility]. Note to schedulers to add lab appt before mid-level appt on 12/24.  dph

## 2011-11-25 ENCOUNTER — Telehealth: Payer: Self-pay | Admitting: *Deleted

## 2011-11-25 NOTE — Telephone Encounter (Signed)
Notified patient of Bmet results. Continue Ktab twice daily. Will recheck on 12/24. Made him aware that refill was sent to pharmacy.

## 2011-11-26 ENCOUNTER — Other Ambulatory Visit: Payer: Self-pay | Admitting: Urology

## 2011-11-26 ENCOUNTER — Encounter (HOSPITAL_COMMUNITY): Payer: Self-pay | Admitting: Pharmacy Technician

## 2011-11-26 ENCOUNTER — Encounter (HOSPITAL_COMMUNITY): Payer: Self-pay | Admitting: *Deleted

## 2011-11-26 ENCOUNTER — Other Ambulatory Visit: Payer: Self-pay | Admitting: Nurse Practitioner

## 2011-11-26 DIAGNOSIS — C20 Malignant neoplasm of rectum: Secondary | ICD-10-CM

## 2011-11-27 NOTE — H&P (Signed)
Plan:  History of Present Illness   [name] is seen in consultation from Dr Ovidio Kin.  He was found during work-up of a rectal mass on CT scan in September to have severe chronic right hydronephrosis secondary to a proximal 9 mm ureteral calculus .There are also several nonobstructing renal calculi.  He denies any flank pain.  He denies any history of kidney stone.  He is getting chemo radiation for rectal cancer. Renal scan showed 18% function in the right kidney. He is scheduled today for cystoscopy, right retrograde pyelogram, ureteroscopy with holmium laser and double-J stent. The procedure, the risks and benefits were discussed with him. The risks include but are not limited to hemorrhage, infection, ureteral injury, inability to extract the stone. He understands and wishes to proceed.  Past Medical History Problems  1. History of  Arthritis V13.4 2. History of  Hypertension 401.9 3. History of  Murmurs 785.2 4. History of  Nephrolithiasis V13.01 5. History of  Rectal Cancer V10.06  Surgical History Problems  1. History of  Hand Surgery Right 2. History of  Wrist Surgery Left  Current Meds 1. Bisoprolol-Hydrochlorothiazide TABS; Therapy: (Recorded:30Oct2012) to 2. Naproxen TABS; Therapy: (Recorded:30Oct2012) to 3. Norvasc 5 MG Oral Tablet; Therapy: (Recorded:30Oct2012) to  Allergies Medication  1. No Known Drug Allergies  Family History Problems  1. Family history of  Family Health Status - Mother's Age 52. Family history of  Father Deceased At Age 69  Social History Problems    Caffeine Use   Marital History - Currently Married   Tobacco Use 305.1 Denied    History of  Alcohol Use  Review of Systems Genitourinary, constitutional, skin, eye, otolaryngeal, hematologic/lymphatic, cardiovascular, pulmonary, endocrine, musculoskeletal, gastrointestinal, neurological and psychiatric system(s) were reviewed and pertinent findings if present are noted.    Genitourinary: urinary frequency, feelings of urinary urgency, nocturia, urinary stream starts and stops and penile pain.  Gastrointestinal: diarrhea and melena.  Constitutional: feeling tired (fatigue).  ENT: sore throat and sinus problems.  Respiratory: cough.  Musculoskeletal: joint pain.    Vitals Vital Signs [Data Includes: Last 1 Day]  30Oct2012 02:03PM  BMI Calculated: 25.77 BSA Calculated: 2 Height: 5 ft 10 in Weight: 180 lb  Blood Pressure: 181 / 105 Heart Rate: 73 Respiration: 18  Physical Exam Constitutional: Well nourished and well developed . No acute distress.  ENT:. The ears and nose are normal in appearance.  Neck: The appearance of the neck is normal and no neck mass is present.  Pulmonary: No respiratory distress and normal respiratory rhythm and effort.  Cardiovascular: Heart rate and rhythm are normal . No peripheral edema.  Abdomen: The abdomen is soft and nontender. No masses are palpated. No CVA tenderness. No hernias are palpable. No hepatosplenomegaly noted.  Rectal: Rectal exam demonstrates normal sphincter tone. Estimated prostate size is 2+. The prostate has no nodularity and is not indurated.  Genitourinary: Examination of the penis demonstrates a normal meatus. The penis is uncircumcised. The scrotum is normal in appearance. Examination of the right scrotum demonstrates no hydrocele. Examination of the left scrotum demostrates no hydrocele. The right epididymis is palpably normal. The left epididymis is palpably normal. The right spermatic cord is palpably normal. The left spermatic cord is palpably normal. The right testis is palpably normal. The left testis is normal.  Lymphatics: The femoral and inguinal nodes are not enlarged or tender.  Skin: Normal skin turgor, no visible rash and no visible skin lesions.  Neuro/Psych:. Mood and affect are  appropriate.    Results/Data Urine [Data Includes: Last 1 Day]  30Oct2012  COLOR: YELLOW  Reference Range  YELLOW APPEARANCE: CLEAR  Reference Range CLEAR SPECIFIC GRAVITY: 1.020  Reference Range 1.005-1.030 pH: 6.0  Reference Range 5.0-8.0 GLUCOSE: NEG mg/dL Reference Range NEG BILIRUBIN: NEG  Reference Range NEG KETONE: NEG mg/dL Reference Range NEG BLOOD: NEG  Reference Range NEG PROTEIN: NEG mg/dL Reference Range NEG UROBILINOGEN: 2 mg/dL Abnormal High Reference Range 0.0-1.0 NITRITE: NEG  Reference Range NEG LEUKOCYTE ESTERASE: TRACE  Abnormal Reference Range NEG SQUAMOUS EPITHELIAL/HPF: RARE  Reference Range RARE WBC: 7-10 WBC/hpf Abnormal Reference Range <4 RBC: NONE SEEN RBC/hpf Reference Range <4 BACTERIA: NONE SEEN  Reference Range RARE CRYSTALS: Calcium Oxalate crystals noted  Reference Range NEG CASTS: NONE SEEN  Reference Range NEG   I independently reviewed the CT scan and the findings are as noted above.  The renal cortex on the right side is very thin.   Assessment Assessed  1. Nephrolithiasis Of The Right Kidney 592.0 2. Proximal Ureteral Stone On The Right 592.1 3. Hydronephrosis 591  Plan Health Maintenance (V70.0)  1. UA With REFLEX  Done: 30Oct2012 01:33PM Hydronephrosis (591)  2. LASIX RENOGRAM  Requested for: 30Oct2012 3. Follow-up After Test Office  Follow-up  Requested for: 21Nov2012   Lasix renal function for differential renal function.  Further evaluation and or treatment will depend on scan results.  If there is minimal right renal function he may not need stone removal.  If the function of the right kidney is adequate he might benefit from ESL of the ureteral calculus. He needs cystoscopy and stone manipulation.   Burnett Spray-HENRY 11/27/2011, 6:20 PM

## 2011-11-28 ENCOUNTER — Encounter: Payer: Self-pay | Admitting: *Deleted

## 2011-11-28 ENCOUNTER — Ambulatory Visit (HOSPITAL_COMMUNITY): Payer: BC Managed Care – PPO | Admitting: Anesthesiology

## 2011-11-28 ENCOUNTER — Ambulatory Visit (HOSPITAL_COMMUNITY): Payer: BC Managed Care – PPO

## 2011-11-28 ENCOUNTER — Encounter (HOSPITAL_COMMUNITY): Payer: Self-pay

## 2011-11-28 ENCOUNTER — Encounter (HOSPITAL_COMMUNITY): Payer: Self-pay | Admitting: Anesthesiology

## 2011-11-28 ENCOUNTER — Ambulatory Visit (HOSPITAL_COMMUNITY)
Admission: RE | Admit: 2011-11-28 | Discharge: 2011-11-28 | Disposition: A | Discharge status: Home / Self Care | Payer: BC Managed Care – PPO | Source: Ambulatory Visit | Attending: Urology | Admitting: Urology

## 2011-11-28 ENCOUNTER — Encounter (HOSPITAL_COMMUNITY)
Admission: RE | Disposition: A | Discharge status: Home / Self Care | Payer: Self-pay | Source: Ambulatory Visit | Attending: Urology

## 2011-11-28 ENCOUNTER — Other Ambulatory Visit: Payer: Self-pay | Admitting: *Deleted

## 2011-11-28 DIAGNOSIS — J449 Chronic obstructive pulmonary disease, unspecified: Secondary | ICD-10-CM | POA: Insufficient documentation

## 2011-11-28 DIAGNOSIS — Z85048 Personal history of other malignant neoplasm of rectum, rectosigmoid junction, and anus: Secondary | ICD-10-CM | POA: Insufficient documentation

## 2011-11-28 DIAGNOSIS — C20 Malignant neoplasm of rectum: Secondary | ICD-10-CM

## 2011-11-28 DIAGNOSIS — N1330 Unspecified hydronephrosis: Secondary | ICD-10-CM | POA: Insufficient documentation

## 2011-11-28 DIAGNOSIS — Z79899 Other long term (current) drug therapy: Secondary | ICD-10-CM | POA: Insufficient documentation

## 2011-11-28 DIAGNOSIS — C649 Malignant neoplasm of unspecified kidney, except renal pelvis: Secondary | ICD-10-CM | POA: Insufficient documentation

## 2011-11-28 DIAGNOSIS — N201 Calculus of ureter: Secondary | ICD-10-CM | POA: Insufficient documentation

## 2011-11-28 DIAGNOSIS — N200 Calculus of kidney: Secondary | ICD-10-CM | POA: Insufficient documentation

## 2011-11-28 DIAGNOSIS — J4489 Other specified chronic obstructive pulmonary disease: Secondary | ICD-10-CM | POA: Insufficient documentation

## 2011-11-28 DIAGNOSIS — I10 Essential (primary) hypertension: Secondary | ICD-10-CM | POA: Insufficient documentation

## 2011-11-28 HISTORY — DX: Malignant neoplasm of rectum: C20

## 2011-11-28 HISTORY — DX: Chronic obstructive pulmonary disease, unspecified: J44.9

## 2011-11-28 HISTORY — DX: Hypokalemia: E87.6

## 2011-11-28 LAB — BASIC METABOLIC PANEL
GFR calc Af Amer: 49 mL/min — ABNORMAL LOW (ref >90)
GFR calc non Af Amer: 42 mL/min — ABNORMAL LOW (ref >90)
Potassium: 3.1 mEq/L — ABNORMAL LOW (ref 3.5–5.1)
Sodium: 142 mEq/L (ref 135–145)

## 2011-11-28 LAB — CBC
MCV: 106.1 fL — ABNORMAL HIGH (ref 78.0–100.0)
Platelets: 216 10*3/uL (ref 150–400)
RBC: 3.11 MIL/uL — ABNORMAL LOW (ref 4.22–5.81)
WBC: 8.3 10*3/uL (ref 4.0–10.5)

## 2011-11-28 LAB — SURGICAL PCR SCREEN
MRSA, PCR: NEGATIVE
Staphylococcus aureus: NEGATIVE

## 2011-11-28 SURGERY — CYSTOURETEROSCOPY, WITH RETROGRADE PYELOGRAM AND STENT INSERTION
Anesthesia: General | Laterality: Right | Wound class: Clean Contaminated

## 2011-11-28 MED ORDER — PROMETHAZINE HCL 25 MG/ML IJ SOLN: 6.2500 mg | INTRAMUSCULAR | Status: DC | PRN

## 2011-11-28 MED ORDER — CEFAZOLIN SODIUM 1-5 GM-% IV SOLN
INTRAVENOUS | Status: AC
  Filled 2011-11-28: qty 50

## 2011-11-28 MED ORDER — MUPIROCIN 2 % EX OINT
TOPICAL_OINTMENT | CUTANEOUS | Status: AC
  Filled 2011-11-28: qty 22

## 2011-11-28 MED ORDER — MIDAZOLAM HCL 5 MG/5ML IJ SOLN
INTRAMUSCULAR | Status: DC | PRN
  Administered 2011-11-28: 2 mg via INTRAVENOUS

## 2011-11-28 MED ORDER — CEFAZOLIN SODIUM 1-5 GM-% IV SOLN: 1.0000 g | INTRAVENOUS | Status: DC

## 2011-11-28 MED ORDER — DEXAMETHASONE SODIUM PHOSPHATE 10 MG/ML IJ SOLN
INTRAMUSCULAR | Status: DC | PRN
  Administered 2011-11-28: 10 mg via INTRAVENOUS

## 2011-11-28 MED ORDER — OXYCODONE-ACETAMINOPHEN 5-325 MG PO TABS: 1.0000 | ORAL_TABLET | Freq: Four times a day (QID) | ORAL | Status: DC | PRN

## 2011-11-28 MED ORDER — SODIUM CHLORIDE 0.9 % IR SOLN
Status: DC | PRN
  Administered 2011-11-28: 3000 mL

## 2011-11-28 MED ORDER — LACTATED RINGERS IV SOLN
INTRAVENOUS | Status: DC | PRN
  Administered 2011-11-28 (×2): via INTRAVENOUS

## 2011-11-28 MED ORDER — CEFAZOLIN SODIUM 1-5 GM-% IV SOLN
INTRAVENOUS | Status: DC | PRN
  Administered 2011-11-28: 1 g via INTRAVENOUS

## 2011-11-28 MED ORDER — LIDOCAINE HCL (CARDIAC) 20 MG/ML IV SOLN
INTRAVENOUS | Status: DC | PRN
  Administered 2011-11-28: 50 mg via INTRAVENOUS

## 2011-11-28 MED ORDER — PROPOFOL 10 MG/ML IV BOLUS
INTRAVENOUS | Status: DC | PRN
  Administered 2011-11-28: 180 mg via INTRAVENOUS

## 2011-11-28 MED ORDER — ONDANSETRON HCL 4 MG/2ML IJ SOLN
INTRAMUSCULAR | Status: DC | PRN
  Administered 2011-11-28: 4 mg via INTRAVENOUS

## 2011-11-28 MED ORDER — BISOPROLOL FUMARATE 5 MG PO TABS
5.0000 mg | ORAL_TABLET | Freq: Once | ORAL | Status: AC
  Administered 2011-11-28: 5 mg via ORAL
  Filled 2011-11-28: qty 1

## 2011-11-28 MED ORDER — FENTANYL CITRATE 0.05 MG/ML IJ SOLN
INTRAMUSCULAR | Status: DC | PRN
  Administered 2011-11-28: 100 ug via INTRAVENOUS
  Administered 2011-11-28 (×2): 50 ug via INTRAVENOUS

## 2011-11-28 MED ORDER — IOHEXOL 300 MG/ML  SOLN
INTRAMUSCULAR | Status: AC
  Filled 2011-11-28: qty 1

## 2011-11-28 MED ORDER — IOHEXOL 300 MG/ML  SOLN
INTRAMUSCULAR | Status: DC | PRN
  Administered 2011-11-28: 4 mL via INTRAVENOUS

## 2011-11-28 MED ORDER — HYDROMORPHONE HCL PF 1 MG/ML IJ SOLN: 0.2500 mg | INTRAMUSCULAR | Status: DC | PRN

## 2011-11-28 SURGICAL SUPPLY — 16 items
BAG URO CATCHER STRL LF (DRAPE) ×2 IMPLANT
CATH URET 5FR 28IN OPEN ENDED (CATHETERS) ×2 IMPLANT
CLOTH BEACON ORANGE TIMEOUT ST (SAFETY) ×2 IMPLANT
DRAPE CAMERA CLOSED 9X96 (DRAPES) ×2 IMPLANT
GLOVE SURG SS PI 8.0 STRL IVOR (GLOVE) ×2 IMPLANT
GOWN PREVENTION PLUS XLARGE (GOWN DISPOSABLE) ×2 IMPLANT
GOWN STRL REIN XL XLG (GOWN DISPOSABLE) ×2 IMPLANT
GUIDEWIRE ANG ZIPWIRE 038X150 (WIRE) ×1 IMPLANT
GUIDEWIRE STR DUAL SENSOR (WIRE) ×1 IMPLANT
MANIFOLD NEPTUNE II (INSTRUMENTS) ×2 IMPLANT
MARKER SKIN DUAL TIP RULER LAB (MISCELLANEOUS) ×2 IMPLANT
PACK CYSTO (CUSTOM PROCEDURE TRAY) ×2 IMPLANT
SHEATH ACCESS URETERAL 38CM (SHEATH) ×1 IMPLANT
STENT CONTOUR URETERAL (STENTS) ×1 IMPLANT
STENT PERCUFLEX 4.8FRX26 (STENTS) ×1 IMPLANT
TUBING CONNECTING 10 (TUBING) ×2 IMPLANT

## 2011-11-28 NOTE — Anesthesia Preprocedure Evaluation (Signed)
Anesthesia Evaluation  Patient identified by MRN, date of birth, ID band Patient awake    Reviewed: Allergy & Precautions, H&P , NPO status , Patient's Chart, lab work & pertinent test results, reviewed documented beta blocker date and time   Airway Mallampati: II TM Distance: >3 FB Neck ROM: Full    Dental   Pulmonary COPDCurrent Smoker,  Pt denies OSA. Admits to snoring a lot, frequent awaking at night. clear to auscultation        Cardiovascular hypertension, Pt. on medications Regular Normal Denies cardiac symptoms   Neuro/Psych Negative Neurological ROS  Negative Psych ROS   GI/Hepatic Neg liver ROS, Colon cancer   Endo/Other  Negative Endocrine ROS  Renal/GU Kidney stone  Genitourinary negative   Musculoskeletal negative musculoskeletal ROS (+)   Abdominal   Peds negative pediatric ROS (+)  Hematology negative hematology ROS (+)   Anesthesia Other Findings   Reproductive/Obstetrics negative OB ROS                           Anesthesia Physical Anesthesia Plan  ASA: III  Anesthesia Plan: General   Post-op Pain Management:    Induction: Intravenous  Airway Management Planned: LMA  Additional Equipment:   Intra-op Plan:   Post-operative Plan: Extubation in OR  Informed Consent: I have reviewed the patients History and Physical, chart, labs and discussed the procedure including the risks, benefits and alternatives for the proposed anesthesia with the patient or authorized representative who has indicated his/her understanding and acceptance.     Plan Discussed with: CRNA and Surgeon  Anesthesia Plan Comments:         Anesthesia Quick Evaluation

## 2011-11-28 NOTE — Brief Op Note (Signed)
Preprocedure diagnosis: severe right hydronephrosis, right ureteral stone  Postprocedure diagnosis same  Procedure done: cystoscopy, right retrograde pyelogram, ureteroscopy, insertion of right double-J stent.  Findings: Right mid obstructing ureteral stone. Ureteroscope could not be advanced to the stone. Therefore a double-J stent was inserted.

## 2011-11-28 NOTE — Op Note (Signed)
Franciso Dierks is a 60 y.o.   11/28/2011  General  Surgeon: Wendie Simmer. Irais Mottram    Indication: The patient is a 60 years old male who was found on CT scan to have severe right hydronephrosis secondary to a 9 mm obstructing  right mid ureteral stone. Renal scan showed 18% function of the right kidney. The patient is getting chemotherapy radiation for invasive renal cancer. He needs a cystoscopy with right ureteroscopy and stone manipulation in order to solve age as much renal function as possible. The patient is scheduled today for stone manipulation.  Procedures:  The patient was identified by his wrist band and proper timeout was taken.  Under general anesthesia patient was prepped and draped and placed in the dorsolithotomy position. A panendoscope was inserted in the bladder. The anterior urethra is normal; there is moderate prostatic hypertrophy. The bladder mucosa is reddened.  There is no stone or tumor in the bladder. The ureteral orifices are in normal position and shape.  Retrograde pyelogram:  A #5 French open ended catheter was passed through the cystoscope and through the right ureteral orifice. Contrast was then injected through the open-ended catheter. There is a large filling defect in the mid ureter. There is also a kink of the mid ureter. The proximal ureter is very dilated. A sensor wire was passed and through the open-ended catheter. It was difficult to pass the sensor wire beyond the stone. However after several attempts the sensor wire was advanced in the renal pelvis. Open ended catheter was then removed. The bladder was emptied and the cystoscope removed.  A ureteroscope access sheath was then passed over the sensor wire and the ureteroscope access sheath could not be advanced beyond the distal ureter. A flexible digital ureteroscope was then passed through the ureteroscope access sheath. There is an area of narrowing at the mid ureter and the ureteroscope could not be passed to  the level of the stone. After several attempts to navigate the ureteroscope through the ureter failed I removed the ureteroscope. I then passed the sensor wire through the ureteroscope access sheath. This time the sensor wire would not pass around the stone. I then removed the sensor wire and passed a glide wire through the ureteroscope access sheath. With difficulty the sensor wire  around the stone. With the ureteroscope access sheath in place I attempted to pass a #6 [place] double-J stent over  the Glidewire; but the double-J stent would not go around the stone. I removed the #6 [place] double-J stent and passed a #4.8 [place] stent over the glide wire. The # 4.8 French double-J stent was passed around the stone into the renal pelvis. At this point I removed the glide wire and removed the ureteroscope access sheath. At this point I found that the distal curl of the double-J stent was in the distal ureter.  I passed a semirigid ureteroscope in the bladder and in the distal ureter. I was then able to grasp the distal end of the double-J stent with a Nitinol basket and pulled it in the bladder. I removed the the semirigid ureteroscope. A cystoscope was then passed in the bladder. The distal curl of the double-J stent is in good position in the bladder. Under fluoroscopy the proximal curl was in good position in the renal pelvis.  The bladder was then emptied and the cystoscope removed.  The patient tolerated the procedure well and left the OR in satisfactory condition to postanesthesia care unit.  th

## 2011-11-28 NOTE — Anesthesia Postprocedure Evaluation (Signed)
  Anesthesia Post-op Note  Patient: [name] [name]  Procedure(s) Performed:  CYSTOSCOPY WITH RETROGRADE PYELOGRAM, URETEROSCOPY AND STENT PLACEMENT  Patient Location: PACU  Anesthesia Type: General  Level of Consciousness: awake and oriented  Airway and Oxygen Therapy: Patient Spontanous Breathing  Post-op Pain: none  Post-op Assessment: Post-op Vital signs reviewed, Patient's Cardiovascular Status Stable, Respiratory Function Stable and Patent Airway  Post-op Vital Signs: stable  Complications: No apparent anesthesia complications

## 2011-11-28 NOTE — Progress Notes (Signed)
CC:   [name] [name]. Ezzard Standing, M.D. Ladene Artist, M.D. Stacie Glaze, MD  End of treatment note  DIAGNOSIS:  A 60 year old gentleman with stage T3 NX MX adenocarcinoma of the rectum.  INDICATION FOR TREATMENT:  Preoperative chemoradiotherapy.  RADIATION TREATMENT DATES:  09/29/2011 through 11/07/2011.  SITE/DOSE: 1. The patient's pelvis received 45 Gy in 25 fractions of 1.8 Gy. 2. The patient's primary tumor was boosted to 50.4 Gy with 3     additional fractions of 1.8 Gy.  BEAM/ENERGY: 1. The patient was set up prone using a belly board positioner and     treated using anterior, posterior, right lateral, and left lateral     Gantry positions, delivering 18 megavolt photons to the primary     tumor and regional lymph nodes of the pelvis using standard     techniques.  Port films were performed weekly to ensure proper     setup. 2. The patient's tumor was boosted with 3D conformal techniques with a     3-field beam arrangement employing bilateral posterior oblique     fields with 18 megavolt photons and weekly port films to assure     setup.  NARRATIVE:  The patient tolerated his course of preoperative chemoradiotherapy relatively well.  He did not experience any unplanned interruptions in his course of therapy.  PLAN:  The patient will return to the radiation oncology clinic for routine followup in approximately 1 month.  He should be ready to proceed with surgical resection during the 1st or 2nd week of January. It should be noted that the patient did have potential liver metastases seen on his staging PET scan and re-imaging prior to surgical intervention may be warranted to guide further intraoperative evaluation or management of these if appropriate.    ______________________________ Oneita Hurt, M.D. MAM/MEDQ  D:  11/17/2011  T:  11/28/2011  Job:  [id number]

## 2011-11-28 NOTE — Progress Notes (Signed)
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

## 2011-11-28 NOTE — Transfer of Care (Signed)
Immediate Anesthesia Transfer of Care Note  Patient: [name] [name]  Procedure(s) Performed:  CYSTOSCOPY WITH RETROGRADE PYELOGRAM, URETEROSCOPY AND STENT PLACEMENT  Patient Location: PACU  Anesthesia Type: General  Level of Consciousness: sedated  Airway & Oxygen Therapy: Patient Spontanous Breathing and Patient connected to face mask oxygen  Post-op Assessment: Report given to PACU RN and Post -op Vital signs reviewed and stable  Post vital signs: Reviewed and stable  Complications: No apparent anesthesia complications

## 2011-12-01 ENCOUNTER — Other Ambulatory Visit (HOSPITAL_BASED_OUTPATIENT_CLINIC_OR_DEPARTMENT_OTHER): Payer: BC Managed Care – PPO | Admitting: Lab

## 2011-12-01 ENCOUNTER — Ambulatory Visit (HOSPITAL_BASED_OUTPATIENT_CLINIC_OR_DEPARTMENT_OTHER): Payer: BC Managed Care – PPO | Admitting: Nurse Practitioner

## 2011-12-01 ENCOUNTER — Telehealth: Payer: Self-pay | Admitting: Oncology

## 2011-12-01 VITALS — BP 137/84 | HR 111 | Temp 98.5°F | Ht 69.0 in | Wt 168.0 lb

## 2011-12-01 DIAGNOSIS — E876 Hypokalemia: Secondary | ICD-10-CM

## 2011-12-01 DIAGNOSIS — C20 Malignant neoplasm of rectum: Secondary | ICD-10-CM

## 2011-12-01 DIAGNOSIS — K7689 Other specified diseases of liver: Secondary | ICD-10-CM

## 2011-12-01 DIAGNOSIS — Z923 Personal history of irradiation: Secondary | ICD-10-CM

## 2011-12-01 LAB — COMPREHENSIVE METABOLIC PANEL
ALT: 8 U/L (ref 0–53)
AST: 11 U/L (ref 0–37)
Albumin: 3.2 g/dL — ABNORMAL LOW (ref 3.5–5.2)
BUN: 25 mg/dL — ABNORMAL HIGH (ref 6–23)
Calcium: 9 mg/dL (ref 8.4–10.5)
Chloride: 104 mEq/L (ref 96–112)
Potassium: 3 mEq/L — ABNORMAL LOW (ref 3.5–5.3)
Sodium: 140 mEq/L (ref 135–145)
Total Protein: 6.6 g/dL (ref 6.0–8.3)

## 2011-12-01 NOTE — Progress Notes (Signed)
OFFICE PROGRESS NOTE  Interval history:  Mr. Pollack returns as scheduled. On 11/28/2011 he underwent a cystoscopy, right retrograde pyelogram, ureteroscopy and insertion of a right double-J stent by Dr. Brunilda Payor. He was found to have a right mid obstructing ureteral stone. The ureteroscope could not be advanced to the stone.  Mr. Mcmahill reports the rectal pain is better. He is rarely taking pain medication. He continues to have slight rectal bleeding with bowel movements. He denies mouth sores. No nausea or vomiting.   Objective: Blood pressure 137/84, pulse 111, temperature 98.5 F (36.9 C), temperature source Oral, height 5\' 9"  (1.753 m), weight 168 lb (76.204 kg).  Oropharynx is without thrush or ulceration. Lungs are clear. No wheezes or rales. Regular cardiac rhythm. Abdomen is soft and nontender. No hepatomegaly. Extremities are without edema.   Lab Results: Lab Results  Component Value Date   WBC 8.3 11/28/2011   HGB 11.6* 11/28/2011   HCT 33.0* 11/28/2011   MCV 106.1* 11/28/2011   PLT 216 11/28/2011    Chemistry:    Chemistry      Component Value Date/Time   NA 140 12/01/2011 0949   K 3.0* 12/01/2011 0949   CL 104 12/01/2011 0949   CO2 24 12/01/2011 0949   BUN 25* 12/01/2011 0949   CREATININE 1.59* 12/01/2011 0949      Component Value Date/Time   CALCIUM 9.0 12/01/2011 0949   ALKPHOS 108 12/01/2011 0949   AST 11 12/01/2011 0949   ALT 8 12/01/2011 0949   BILITOT 0.7 12/01/2011 0949       Studies/Results: Dg C-arm 1-60 Min-no Report  11/28/2011  CLINICAL DATA: cysto, with retrograde pyelogram in open room   C-ARM 1-60 MINUTES  Fluoroscopy was utilized by the requesting physician.  No radiographic  interpretation.      Medications: I have reviewed the patient's current medications.  Assessment/Plan:  1. Metastatic rectal cancer status post biopsy of rectal mass 09/03/2011 with pathology showing invasive adenocarcinoma. Staging CT scans 09/03/2011 showed a 10  mm hypodense rounded lesion in left lateral hepatic lobe, similar 6 mm lesion in the superior right hepatic lobe and a smaller subcapsular lesion in the lateral right hepatic lobe; rectal mass within the lumen of the bowel emanating from the left wall measuring 4.9 x 3.5 cm and a mass within the low left perirectal fat measuring 2.3 x 3.2 cm consistent with local extension of carcinoma. Staging PET scan 09/18/2011 showed a 5.1 cm distal sigmoid/rectal mass with max SUV 19.2 with local extension into the left perirectal fat with max SUV 7.7. At least 2 suspected hepatic metastases with max SUV 6.7 in the lateral segment left hepatic lobe and max SUV 4.1 in the lateral right hepatic dome. Additional tiny hypodensities in the liver may be cysts; patchy/nodular opacity in the left lower lobe with max SUV 3.5 worrisome for pulmonary metastasis, less likely infectious. He began radiation and concurrent Xeloda chemotherapy on 09/29/2011. He completed radiation on 11/06/2011. 2. Rectal pain and bleeding secondary to #1. Improved. 3. Frequent loose stools secondary to #1. Improved. 4. Hypertension. 5. Tobacco use. 6. CT scan 09/03/2011 with chronic right hydronephrosis secondary to an obstructing calculus in the mid right ureter. He is followed by Dr. Brunilda Payor. 7. Status post cystoscopy, right retrograde pyelogram, ureteroscopy and insertion of right double-J stent 11/28/2011. 8. Elevated creatinine. Stable. We will repeat on 12/04/2011. 9. Hypokalemia-he misunderstood the potassium directions. He will take 20 mEq twice daily for the next 3 days and return for  repeat labs on 12/04/2011.  Disposition-Dr. Truett Perna recommends an MRI of the liver to further evaluate the liver lesions. We are also referring [name] to interventional radiology for a biopsy of a liver lesion. We initiated discussion regarding treatment on the FOLFOX regimen. We discussed myelosuppression, nausea, mouth sores, diarrhea, and hand-foot  syndrome. We also discussed the neurotoxicity associated with oxaliplatin. Mr. Szilagyi will return for a followup visit with Dr. Truett Perna on 12/15/2011. He will contact the office in the interim with any problems.  Patient seen with Dr. Truett Perna.    Lonna Cobb ANP/GNP-BC

## 2011-12-01 NOTE — Telephone Encounter (Signed)
gve the pt his dec,jan 2013 appt calendar along with the mri appt. Pt is aware he will be called with the biopsy appt from rad. S/w tiffany from scheduling regarding the biopsy appt.

## 2011-12-04 ENCOUNTER — Other Ambulatory Visit (HOSPITAL_BASED_OUTPATIENT_CLINIC_OR_DEPARTMENT_OTHER): Payer: BC Managed Care – PPO | Admitting: Lab

## 2011-12-04 DIAGNOSIS — E876 Hypokalemia: Secondary | ICD-10-CM

## 2011-12-04 LAB — BASIC METABOLIC PANEL
BUN: 22 mg/dL (ref 6–23)
Calcium: 8.6 mg/dL (ref 8.4–10.5)
Creatinine, Ser: 1.52 mg/dL — ABNORMAL HIGH (ref 0.50–1.35)

## 2011-12-06 ENCOUNTER — Ambulatory Visit (HOSPITAL_COMMUNITY)
Admission: RE | Admit: 2011-12-06 | Discharge: 2011-12-06 | Disposition: A | Discharge status: Home / Self Care | Payer: BC Managed Care – PPO | Source: Ambulatory Visit | Attending: Nurse Practitioner | Admitting: Nurse Practitioner

## 2011-12-06 DIAGNOSIS — C787 Secondary malignant neoplasm of liver and intrahepatic bile duct: Secondary | ICD-10-CM | POA: Insufficient documentation

## 2011-12-06 DIAGNOSIS — C20 Malignant neoplasm of rectum: Secondary | ICD-10-CM | POA: Insufficient documentation

## 2011-12-06 DIAGNOSIS — N139 Obstructive and reflux uropathy, unspecified: Secondary | ICD-10-CM | POA: Insufficient documentation

## 2011-12-06 DIAGNOSIS — K7689 Other specified diseases of liver: Secondary | ICD-10-CM | POA: Insufficient documentation

## 2011-12-06 MED ORDER — GADOBENATE DIMEGLUMINE 529 MG/ML IV SOLN
15.0000 mL | Freq: Once | INTRAVENOUS | Status: AC | PRN
  Administered 2011-12-06: 15 mL via INTRAVENOUS

## 2011-12-09 HISTORY — PX: OTHER SURGICAL HISTORY: SHX169

## 2011-12-11 ENCOUNTER — Encounter: Payer: Self-pay | Admitting: Radiation Oncology

## 2011-12-11 ENCOUNTER — Ambulatory Visit
Admission: RE | Admit: 2011-12-11 | Discharge: 2011-12-11 | Disposition: A | Discharge status: Home / Self Care | Payer: 59 | Source: Ambulatory Visit | Attending: Radiation Oncology | Admitting: Radiation Oncology

## 2011-12-11 VITALS — BP 130/86 | HR 105 | Temp 98.8°F | Resp 20 | Wt 170.5 lb

## 2011-12-11 DIAGNOSIS — C20 Malignant neoplasm of rectum: Secondary | ICD-10-CM

## 2011-12-11 NOTE — Progress Notes (Signed)
Radiation Oncology         [phone] ________________________________  Name: [name] [name] MRN: [id number]  Date: 12/11/2011  DOB: [birth year]       Follow-Up Visit Note  CC: Sandria Bales. Ezzard Standing, M.D.  Stacie Glaze, MD  Ladene Artist, M.D.   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   Interval Since Last Radiation:  5 weeks  Narrative . . . . . . . . The patient returns today for routine follow-up.  He is essentially without complaint. He denies denies any diarrhea or cystitis symptoms. He did have placement of a stent or nephrolithiasis. The patient underwent followup MRI of the liver which confirmed the presence of at least 2 or metastases. In terms of enhancing lesions, usually 2 certain lesions. There were smaller areas of abnormality on T2 waiting potential represent cysts versus bile duct hamartomas.                              ALLERGIES:   has no known allergies.  Meds. . . . . . . . . . . . Current Outpatient Prescriptions  Medication Sig Dispense Refill  . amLODipine (NORVASC) 5 MG tablet Take 5 mg by mouth daily.       . bisoprolol-hydrochlorothiazide (ZIAC) 5-6.25 MG per tablet Take 1 tablet by mouth daily at 12 noon.       . Naproxen Sodium (ALEVE) 220 MG CAPS Take 1-2 capsules by mouth every 12 (twelve) hours as needed. For pain.      Marland Kitchen oxyCODONE-acetaminophen (PERCOCET) 5-325 MG per tablet Take 1-2 tablets by mouth every 6 (six) hours as needed.  50 tablet  0  . pantoprazole (PROTONIX) 40 MG tablet Take 40 mg by mouth daily as needed. HEART BURN       . potassium chloride (K-DUR) 10 MEQ tablet Take 2 tablets (20 mEq total) by mouth 2 (two) times daily.  20 tablet  0    Physical Findings. . .  weight is 170 lb 8 oz (77.338 kg). His oral temperature is 98.8 F (37.1 C). His blood pressure is 130/86 and his pulse is 105. His respiration is 20. Marland Kitchen  No significant changes.  Lab Findings . . . . .  Lab Results  Component  Value Date   WBC 8.3 11/28/2011   HGB 11.6* 11/28/2011   HCT 33.0* 11/28/2011   MCV 106.1* 11/28/2011   PLT 216 11/28/2011     X-Ray Findings. . . . Mr Liver W Wo Contrast  12/08/2011  *RADIOLOGY REPORT*  Clinical Data: Rectal cancer.  Liver lesions on CT.  MRI ABDOMEN WITH AND WITHOUT CONTRAST  Technique:  Multiplanar multisequence MR imaging of the abdomen was performed both before and after administration of intravenous contrast.  Contrast: 15mL MULTIHANCE GADOBENATE DIMEGLUMINE 529 MG/ML IV SOLN 15 ml Multihance  Comparison: PET 09/18/2011.  Abdominal CT 09/03/2011.  Findings: Normal heart size without pericardial or pleural effusion.  Numerous small T2 hyperintense nonenhancing liver lesions identified.  The majority of these are felt to represent cysts and bile duct hamartomas.  The 2 hypermetabolic lesions described on prior PET are identified.:    - A high right hepatic lobe lesion is T2 hyperintense and measures approximately 6 mm on image 20 of series 1201.  This demonstrates mild peripheral enhancement.  Similar to 09/03/2011, given cross modality comparison.  -The lesion in the high  lateral segment left liver lobe is again identified.  This measures 1.0 cm on image 25 of series 1201. There is peripheral enhancement and surrounding altered perfusion. Similar to 09/03/2011, given cross modality comparison.  An equivocal lesion in the subcapsular inferior right hepatic lobe measures 7 mm on image 25 of series 8.  This is well visualized on diffusion weighted series 4 (image 45).  Normal spleen, stomach, pancreas, gallbladder, biliary tract.  Mild left adrenal thickening.  Normal right kidney.  Small left renal cysts.  Moderate right renal atrophy with chronic hydroureteronephrosis.  Colonic stool burden suggests constipation.  No ascites or abdominal adenopathy.  Probable sebaceous cyst about the right flank.  IMPRESSION:  1.  The two hypermetabolic liver lesions (presumed to be metastasis) are  similar in size to 09/03/2011. 2.  Numerous other liver lesions which are T2 hyperintense and favored to represent cysts and bile duct hamartomas.  Given the cystic appearance of the known metastasis, other small metastasis cannot be entirely excluded but are felt less likely.  On follow- up, special attention is recommended to the inferior right hepatic lesion, which is equivocal and could represent a third metastasis. 3.  No evidence of extrahepatic metastasis within the abdomen. 4.  Chronic right-sided urinary tract obstruction and cortical thinning.  Original Report Authenticated By: Consuello Bossier, M.D.   Dg C-arm 1-60 Min-no Report  11/28/2011  CLINICAL DATA: cysto, with retrograde pyelogram in open room   C-ARM 1-60 MINUTES  Fluoroscopy was utilized by the requesting physician.  No radiographic  interpretation.      Impression . . . . . . . The patient is recovering from the effects of radiation. He has completed pre-op chemoradiation for rectal cancer with oligometastatic disease.  He does have 2 small liver metastases as well as a possible solitary lung metastasis versus lung primary.   At this point, the patient might be eligible for low anterior resection of the primary rectal cancer. Intraoperatively, whether ultrasound or palpation can be used to resect his left liver metastasis and possibly the right metastasis (although it is close to the dome) is a question. Ideally, resection of the primary tumor would occur before significant fibrosis develops in the pelvis. Ideally the patient could be considered for surgery at this time followed by adjuvant chemotherapy and followup imaging of the chest for potential resection of his lung nodule. He is a smoker and his lung nodule could reflect a primary lung cancer versus solitary metastatic deposit.  Plan . . . . . . . . . . . . I will talk with Dr. Ovidio Kin and Dr. Mancel Bale about the plan for his ongoing  management.  _____________________________________  Artist Pais. Kathrynn Running, M.D.

## 2011-12-11 NOTE — Progress Notes (Addendum)
Follow up rectal ca Radiation therapy tx=09/29/11-11/07/11  Allergies:NKda 11/28/11 cystoscopy right retrograde pyelogram,ureteroscopy with right double -J Stent DR. NESI Pt states he has urge to void all the time, doesn't much, had hematuria 1 day this Monday 2x then cleared up  MRI 12/08/11 results in Still smoking 1/2-1ppd, has slowed down stated, no c/o pain or bleeding in bottom, some fatigue,

## 2011-12-12 ENCOUNTER — Other Ambulatory Visit: Payer: Self-pay | Admitting: *Deleted

## 2011-12-12 ENCOUNTER — Ambulatory Visit (HOSPITAL_COMMUNITY): Payer: BC Managed Care – PPO

## 2011-12-15 ENCOUNTER — Ambulatory Visit (HOSPITAL_BASED_OUTPATIENT_CLINIC_OR_DEPARTMENT_OTHER): Payer: 59 | Admitting: Oncology

## 2011-12-15 ENCOUNTER — Telehealth: Payer: Self-pay | Admitting: *Deleted

## 2011-12-15 ENCOUNTER — Ambulatory Visit: Payer: BC Managed Care – PPO

## 2011-12-15 ENCOUNTER — Telehealth: Payer: Self-pay | Admitting: Oncology

## 2011-12-15 ENCOUNTER — Other Ambulatory Visit: Payer: Self-pay | Admitting: *Deleted

## 2011-12-15 VITALS — BP 136/78 | HR 79 | Temp 97.5°F | Ht 69.0 in | Wt 169.9 lb

## 2011-12-15 DIAGNOSIS — C801 Malignant (primary) neoplasm, unspecified: Secondary | ICD-10-CM

## 2011-12-15 DIAGNOSIS — C20 Malignant neoplasm of rectum: Secondary | ICD-10-CM

## 2011-12-15 DIAGNOSIS — C785 Secondary malignant neoplasm of large intestine and rectum: Secondary | ICD-10-CM

## 2011-12-15 DIAGNOSIS — R197 Diarrhea, unspecified: Secondary | ICD-10-CM

## 2011-12-15 DIAGNOSIS — Z79899 Other long term (current) drug therapy: Secondary | ICD-10-CM

## 2011-12-15 LAB — BASIC METABOLIC PANEL
Chloride: 106 mEq/L (ref 96–112)
Potassium: 3.7 mEq/L (ref 3.5–5.3)
Sodium: 143 mEq/L (ref 135–145)

## 2011-12-15 LAB — MAGNESIUM: Magnesium: 1.9 mg/dL (ref 1.5–2.5)

## 2011-12-15 NOTE — Progress Notes (Signed)
OFFICE PROGRESS NOTE   INTERVAL HISTORY:   He returns as scheduled. He now feels well. He denies rectal pain and bleeding.  Dr. Brunilda Payor placed a right ureter stent on 11/28/2011. He reports developing urinary incontinence after placement of the stent.  Objective:  Vital signs in last 24 hours:  Blood pressure 136/78, pulse 79, temperature 97.5 F (36.4 C), temperature source Oral, height 5\' 9"  (1.753 m), weight 169 lb 14.4 oz (77.066 kg).    HEENT: No thrush or ulcers, neck without mass Lymphatics: No cervical, supraclavicular, axillary, or inguinal lymph node Resp: Lungs clear Cardio: Regular rate and rhythm GI: No hepatomegaly, nontender Vascular: No leg edema  Skin: Radiation hyperpigmentation at the perineum    Lab Results:  Lab Results  Component Value Date   WBC 8.3 11/28/2011   HGB 11.6* 11/28/2011   HCT 33.0* 11/28/2011   MCV 106.1* 11/28/2011   PLT 216 11/28/2011    CEA 1.6 on 12/01/2011, 13.8 on 09/17/2011.  Medications: I have reviewed the patient's current medications.  Assessment/Plan: 1. Metastatic rectal cancer status post biopsy of rectal mass 09/03/2011 with pathology showing invasive adenocarcinoma. Staging CT scans 09/03/2011 showed a 10 mm hypodense rounded lesion in left lateral hepatic lobe, similar 6 mm lesion in the superior right hepatic lobe and a smaller subcapsular lesion in the lateral right hepatic lobe; rectal mass within the lumen of the bowel emanating from the left wall measuring 4.9 x 3.5 cm and a mass within the low left perirectal fat measuring 2.3 x 3.2 cm consistent with local extension of carcinoma. Staging PET scan 09/18/2011 showed a 5.1 cm distal sigmoid/rectal mass with max SUV 19.2 with local extension into the left perirectal fat with max SUV 7.7. At least 2 suspected hepatic metastases with max SUV 6.7 in the lateral segment left hepatic lobe and max SUV 4.1 in the lateral right hepatic dome. Additional tiny hypodensities in the  liver may be cysts; patchy/nodular opacity in the left lower lobe with max SUV 3.5 worrisome for pulmonary metastasis, less likely infectious.   - He began radiation and concurrent Xeloda chemotherapy on 09/29/2011. He completed radiation on 11/06/2011.   -MRI of the liver on 12/08/2011 reveals 2 hypermetabolic liver lesions, similar in size to 09/03/2011. Numerous other liver lesions are T2 hyperintense and her favored to represent cysts/bile duct hamartomas. Equivocal 7 mm lesion in the inferior right hepatic lobe. No evidence of extrahepatic metastasis within the abdomen. 2. Rectal pain and bleeding secondary to #1. Resolved 3. Frequent loose stools secondary to #1. Improved. 4. Hypertension. 5. Tobacco use. 6. CT scan 09/03/2011 with chronic right hydronephrosis secondary to an obstructing calculus in the mid right ureter. He is followed by Dr. Brunilda Payor.  7. Status post cystoscopy, right retrograde pyelogram, ureteroscopy and insertion of right double-J stent 11/28/2011. 8. Elevated creatinine. Stable.  9. Hypokalemia-resolved, no longer taking potassium.   Disposition:  He has recovered from the neoadjuvant therapy. I discussed his case with Dr. Kathrynn Running and Dr. Ezzard Standing. I will refer him to interventional radiology for a needle biopsy of one of the liver lesions. If he is confirmed to have metastatic disease we will proceed with systemic chemotherapy. If the liver biopsy is negative I will ask Dr. Ezzard Standing to consider proceeding with surgery. We will again review his case at the GI tumor conference within the next one to 2 weeks. He will return for an office visit after the liver biopsy.   Lucile Shutters, MD  12/15/2011  7:01 PM

## 2011-12-15 NOTE — Telephone Encounter (Signed)
Sent back to the lab today and appt made for 1/15 f/u     aom

## 2011-12-15 NOTE — Telephone Encounter (Signed)
Per Dr. Truett Perna: Creatinine is stable and K+ is normal. Spoke with radiologist and the liver lesion can be biopsied by ultrasound. Order placed in EPIC and scheduler notified. Routed Bmet results to Dr. Brunilda Payor.

## 2011-12-18 ENCOUNTER — Other Ambulatory Visit: Payer: Self-pay | Admitting: Radiology

## 2011-12-22 ENCOUNTER — Ambulatory Visit (HOSPITAL_COMMUNITY)
Admission: RE | Admit: 2011-12-22 | Discharge: 2011-12-22 | Disposition: A | Discharge status: Home / Self Care | Payer: 59 | Source: Ambulatory Visit | Attending: Oncology | Admitting: Oncology

## 2011-12-22 DIAGNOSIS — I10 Essential (primary) hypertension: Secondary | ICD-10-CM | POA: Insufficient documentation

## 2011-12-22 DIAGNOSIS — J4489 Other specified chronic obstructive pulmonary disease: Secondary | ICD-10-CM | POA: Insufficient documentation

## 2011-12-22 DIAGNOSIS — K219 Gastro-esophageal reflux disease without esophagitis: Secondary | ICD-10-CM | POA: Insufficient documentation

## 2011-12-22 DIAGNOSIS — Z923 Personal history of irradiation: Secondary | ICD-10-CM | POA: Insufficient documentation

## 2011-12-22 DIAGNOSIS — Z79899 Other long term (current) drug therapy: Secondary | ICD-10-CM | POA: Insufficient documentation

## 2011-12-22 DIAGNOSIS — C20 Malignant neoplasm of rectum: Secondary | ICD-10-CM

## 2011-12-22 DIAGNOSIS — K7689 Other specified diseases of liver: Secondary | ICD-10-CM | POA: Insufficient documentation

## 2011-12-22 DIAGNOSIS — J449 Chronic obstructive pulmonary disease, unspecified: Secondary | ICD-10-CM | POA: Insufficient documentation

## 2011-12-22 LAB — CBC
HCT: 36.3 % — ABNORMAL LOW (ref 39.0–52.0)
Hemoglobin: 12.7 g/dL — ABNORMAL LOW (ref 13.0–17.0)
WBC: 6.7 10*3/uL (ref 4.0–10.5)

## 2011-12-22 LAB — APTT: aPTT: 33 seconds (ref 24–37)

## 2011-12-22 LAB — PROTIME-INR
INR: 0.97 (ref 0.00–1.49)
Prothrombin Time: 13.1 seconds (ref 11.6–15.2)

## 2011-12-22 MED ORDER — FENTANYL CITRATE 0.05 MG/ML IJ SOLN
INTRAMUSCULAR | Status: AC | PRN
  Administered 2011-12-22: 100 ug via INTRAVENOUS
  Administered 2011-12-22: 50 ug via INTRAVENOUS

## 2011-12-22 MED ORDER — MIDAZOLAM HCL 5 MG/5ML IJ SOLN
INTRAMUSCULAR | Status: AC | PRN
  Administered 2011-12-22: 1 mg via INTRAVENOUS
  Administered 2011-12-22: 2 mg via INTRAVENOUS

## 2011-12-22 MED ORDER — SODIUM CHLORIDE 0.9 % IV SOLN
INTRAVENOUS | Status: DC
  Administered 2011-12-22: 500 mL via INTRAVENOUS

## 2011-12-22 NOTE — H&P (Signed)
[name] [name] is an 61 y.o. male.   Chief Complaint: "I'm here for a biopsy" HPI: Patient with history of rectal carcinoma and liver lesions presents today for US guided left lobe liver biopsy.   Past Medical History  Diagnosis Date  . Hypertension   . Abnormal EKG 11/01/11    REPORT IN EPIC-PT STATES NO KNOWN HEART PROBLEMS-NO CHEST PAINS  . Chronic bronchitis   . Rectal pain   . Nasal congestion   . Blood in stool   . Dyspnea     intermittent with bronchitis  . Heart murmur     SINCE BIRTH--DOES NOT CAUSE ANY PROBLEMS  . COPD (chronic obstructive pulmonary disease)   . GERD (gastroesophageal reflux disease)   . 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 Low blood potassium     POTASSIUM WAS 2.1 ON 11/01/11 VISIT TO ER--PT PUT ON ORAL POTASSIUM--MOST RECENT LAB 11/24/11 POTASSIUM IMPROVED TO 3.1  . Rectal cancer     HAS COMPLETED RADIATION AND XELODA--PLAN RESTAGING TESTING.  DR. Truett Perna [facility]  . Hydronephrosis of right kidney     "Chronic" -SECONDARY TO CALCULUS MID RT URETER AND ADDITIONAL CALCULI WITHIN RT KIDNEY--PT STATES HE HAS PASSED A STONE IN THE PAST--DID NOT KNOW HE HAD MORE STONES UNTIL CT ABD DONE ON 09/03/11  . Cancer     rectal adenocarcinoma stage [id number]  . Rectal bleeding     hx 1 year thought was hemrrhoids  . History of radiation therapy 09/29/11-11/07/11    rectal ca    Past Surgical History  Procedure Date  . Wrist and hand surgery     LEFT WRIST AND RIGHT HAND--GANGLION LW  AND FX OF RT HAS  . Right hand surgery -repair fracture   . Removal ganglion cyst     Family History  Problem Relation Age of Onset  . Lupus    . Anemia Mother   . Hypertension Father   . Kidney disease Father 30    renal failure  . Lupus Sister   . Lupus Brother    Social History:  reports that he has been smoking Cigarettes.  He has a 69 pack-year smoking history. He has never used smokeless tobacco. He reports that he drinks alcohol. He  reports that he does not use illicit drugs.  Allergies: No Known Allergies  Medications Prior to Admission  Medication Sig Dispense Refill  . amLODipine (NORVASC) 5 MG tablet Take 5 mg by mouth daily.       . bisoprolol-hydrochlorothiazide (ZIAC) 5-6.25 MG per tablet Take 1 tablet by mouth daily at 12 noon.       . Naproxen Sodium (ALEVE) 220 MG CAPS Take 1-2 capsules by mouth every 12 (twelve) hours as needed. For pain.      Marland Kitchen oxyCODONE-acetaminophen (PERCOCET) 5-325 MG per tablet Take 1-2 tablets by mouth every 6 (six) hours as needed.  50 tablet  0  . pantoprazole (PROTONIX) 40 MG tablet Take 40 mg by mouth daily as needed. HEART BURN        Medications Prior to Admission  Medication Dose Route Frequency Provider Last Rate Last Dose  . 0.9 %  sodium chloride infusion   Intravenous Continuous Robet Leu, PA        Results for orders placed during the hospital encounter of 12/22/11  CBC      Component Value Range   WBC 6.7  4.0 - 10.5 (K/uL)   RBC  3.56 (*) 4.22 - 5.81 (MIL/uL)   Hemoglobin 12.7 (*) 13.0 - 17.0 (g/dL)   HCT 16.1 (*) 09.6 - 52.0 (%)   MCV 102.0 (*) 78.0 - 100.0 (fL)   MCH 35.7 (*) 26.0 - 34.0 (pg)   MCHC 35.0  30.0 - 36.0 (g/dL)   RDW 04.5  40.9 - 81.1 (%)   Platelets 223  150 - 400 (K/uL)   Results for orders placed during the hospital encounter of 12/22/11  APTT      Component Value Range   aPTT 33  24 - 37 (seconds)  CBC      Component Value Range   WBC 6.7  4.0 - 10.5 (K/uL)   RBC 3.56 (*) 4.22 - 5.81 (MIL/uL)   Hemoglobin 12.7 (*) 13.0 - 17.0 (g/dL)   HCT 91.4 (*) 78.2 - 52.0 (%)   MCV 102.0 (*) 78.0 - 100.0 (fL)   MCH 35.7 (*) 26.0 - 34.0 (pg)   MCHC 35.0  30.0 - 36.0 (g/dL)   RDW 95.6  21.3 - 08.6 (%)   Platelets 223  150 - 400 (K/uL)  PROTIME-INR      Component Value Range   Prothrombin Time 13.1  11.6 - 15.2 (seconds)   INR 0.97  0.00 - 1.49      Review of Systems  Constitutional: Negative for fever and chills.  Respiratory:  Negative for cough and shortness of breath.   Cardiovascular: Negative for chest pain.  Gastrointestinal: Positive for blood in stool. Negative for nausea, vomiting and abdominal pain.  Genitourinary: Positive for dysuria.  Neurological: Negative for headaches.  Endo/Heme/Allergies: Does not bruise/bleed easily.   Filed Vitals:   12/22/11 1100  BP: 142/91  Pulse: 75  Temp: 98.3 F (36.8 C)  TempSrc: Oral  Resp: 18  Height: 5\' 9"  (1.753 m)  Weight: 170 lb (77.111 kg)  SpO2: 97%    Physical Exam  Constitutional: He is oriented to person, place, and time. He appears well-developed and well-nourished.  Cardiovascular: Normal rate and regular rhythm.   Respiratory: Effort normal and breath sounds normal.  GI: Soft. Bowel sounds are normal. There is no tenderness.  Musculoskeletal: Normal range of motion. He exhibits no edema.  Neurological: He is alert and oriented to person, place, and time.     Assessment/Plan Patient with history of rectal carcinoma and liver lesions; plan is for US guided left lobe liver lesion biopsy.  Keirsten Matuska,D KEVIN 12/22/2011, 11:51 AM

## 2011-12-22 NOTE — Procedures (Signed)
Attempted ultrasound-guided FNA of small left hepatic lesion.  Only 2 passes could be safely made.  Cytology is pending.

## 2011-12-23 ENCOUNTER — Ambulatory Visit (HOSPITAL_BASED_OUTPATIENT_CLINIC_OR_DEPARTMENT_OTHER): Payer: 59 | Admitting: Nurse Practitioner

## 2011-12-23 ENCOUNTER — Telehealth: Payer: Self-pay | Admitting: Oncology

## 2011-12-23 VITALS — BP 139/81 | HR 91 | Temp 98.3°F | Ht 69.0 in | Wt 170.7 lb

## 2011-12-23 DIAGNOSIS — Z79899 Other long term (current) drug therapy: Secondary | ICD-10-CM

## 2011-12-23 DIAGNOSIS — K7689 Other specified diseases of liver: Secondary | ICD-10-CM

## 2011-12-23 DIAGNOSIS — C20 Malignant neoplasm of rectum: Secondary | ICD-10-CM

## 2011-12-23 DIAGNOSIS — Z923 Personal history of irradiation: Secondary | ICD-10-CM

## 2011-12-23 NOTE — Telephone Encounter (Signed)
appt made for 2/19 and printed for pt  aom

## 2011-12-23 NOTE — Progress Notes (Signed)
OFFICE PROGRESS NOTE  Interval history:  [name] [name] returns as scheduled. He overall feels well. He continues to have small frequent bowel movements. He intermittently notes blood with the bowel movements. No nausea or vomiting. He denies abdominal pain. He has a good appetite.   Objective: Blood pressure 139/81, pulse 91, temperature 98.3 F (36.8 C), temperature source Oral, height 5\' 9"  (1.753 m), weight 170 lb 11.2 oz (77.429 kg).  Oropharynx is without thrush or ulceration. Lungs are clear. Regular cardiac rhythm. Abdomen is soft and nontender. No hepatomegaly. Extremities are without edema.  Lab Results: Lab Results  Component Value Date   WBC 6.7 12/22/2011   HGB 12.7* 12/22/2011   HCT 36.3* 12/22/2011   MCV 102.0* 12/22/2011   PLT 223 12/22/2011    Chemistry:    Chemistry      Component Value Date/Time   NA 143 12/15/2011 1352   K 3.7 12/15/2011 1352   CL 106 12/15/2011 1352   CO2 25 12/15/2011 1352   BUN 26* 12/15/2011 1352   CREATININE 1.66* 12/15/2011 1352      Component Value Date/Time   CALCIUM 9.3 12/15/2011 1352   ALKPHOS 108 12/01/2011 0949   AST 11 12/01/2011 0949   ALT 8 12/01/2011 0949   BILITOT 0.7 12/01/2011 0949       Studies/Results: Mr Liver W Wo Contrast  12/08/2011  *RADIOLOGY REPORT*  Clinical Data: Rectal cancer.  Liver lesions on CT.  MRI ABDOMEN WITH AND WITHOUT CONTRAST  Technique:  Multiplanar multisequence MR imaging of the abdomen was performed both before and after administration of intravenous contrast.  Contrast: 15mL MULTIHANCE GADOBENATE DIMEGLUMINE 529 MG/ML IV SOLN 15 ml Multihance  Comparison: PET 09/18/2011.  Abdominal CT 09/03/2011.  Findings: Normal heart size without pericardial or pleural effusion.  Numerous small T2 hyperintense nonenhancing liver lesions identified.  The majority of these are felt to represent cysts and bile duct hamartomas.  The 2 hypermetabolic lesions described on prior PET are identified.:    - A high right hepatic lobe  lesion is T2 hyperintense and measures approximately 6 mm on image 20 of series 1201.  This demonstrates mild peripheral enhancement.  Similar to 09/03/2011, given cross modality comparison.  -The lesion in the high lateral segment left liver lobe is again identified.  This measures 1.0 cm on image 25 of series 1201. There is peripheral enhancement and surrounding altered perfusion. Similar to 09/03/2011, given cross modality comparison.  An equivocal lesion in the subcapsular inferior right hepatic lobe measures 7 mm on image 25 of series 8.  This is well visualized on diffusion weighted series 4 (image 45).  Normal spleen, stomach, pancreas, gallbladder, biliary tract.  Mild left adrenal thickening.  Normal right kidney.  Small left renal cysts.  Moderate right renal atrophy with chronic hydroureteronephrosis.  Colonic stool burden suggests constipation.  No ascites or abdominal adenopathy.  Probable sebaceous cyst about the right flank.  IMPRESSION:  1.  The two hypermetabolic liver lesions (presumed to be metastasis) are similar in size to 09/03/2011. 2.  Numerous other liver lesions which are T2 hyperintense and favored to represent cysts and bile duct hamartomas.  Given the cystic appearance of the known metastasis, other small metastasis cannot be entirely excluded but are felt less likely.  On follow- up, special attention is recommended to the inferior right hepatic lesion, which is equivocal and could represent a third metastasis. 3.  No evidence of extrahepatic metastasis within the abdomen. 4.  Chronic right-sided urinary tract  obstruction and cortical thinning.  Original Report Authenticated By: Consuello Bossier, M.D.   US Biopsy  12/23/2011  *RADIOLOGY REPORT*  Clinical history:61 year old with rectal cancer and suspicious liver lesions.  PROCEDURE(S): ULTRASOUND GUIDED LIVER LESION FINE NEEDLE ASPIRATION  Physician: Rachelle Hora. Henn, MD  Medications:Versed 2 mg, Fentanyl 150 mcg. A radiology nurse  monitored the patient for moderate sedation.  Moderate sedation time:25 minutes  Fluoroscopy time: None  Procedure:The procedure was explained to the patient.  The risks and benefits of the procedure were discussed and the patient's questions were addressed.   Informed consent was obtained from the patient.  The liver was evaluated with ultrasound.  A 1.5 cm subtle hypoechoic lesion in the lateral left hepatic lobe was identified. The anterior abdomen was prepped and draped in a sterile fashion. Skin was anesthetized with lidocaine.  The procedure was very difficult due to the lesion location the patient's respirations. The patient's respirations became very pronounced as he was sedated.  A 22 gauge needle was directed towards the lesion with ultrasound guidance and a fine needle aspiration was obtained.  A second 22 gauge needle was also advanced towards the lesion with ultrasound guidance and a fine needle aspiration was attempted. Additional fine needle aspirations were unsuccessful due to difficulties visualizing the lesion.  Findings:There is a 1.5 cm subtle hypoechoic lesion in the lateral left hepatic lobe.  Complications: None  Impression:Attempted ultrasound guided fine needle aspiration of the small left hepatic lesion.  The lesion corresponds with the previous PET and MRI findings.  This lesion is not consistent with a simple cyst and suspicious for a metastatic lesion.  However, the ultrasound guided biopsy was limited due to the lesion location and the patient's respirations during the procedure.  Original Report Authenticated By: Richarda Overlie, M.D.   Dg C-arm 1-60 Min-no Report  11/28/2011  CLINICAL DATA: cysto, with retrograde pyelogram in open room   C-ARM 1-60 MINUTES  Fluoroscopy was utilized by the requesting physician.  No radiographic  interpretation.      Medications: I have reviewed the patient's current medications.  Assessment/Plan:  1. Metastatic rectal cancer status post biopsy of  rectal mass 09/03/2011 with pathology showing invasive adenocarcinoma. Staging CT scans 09/03/2011 showed a 10 mm hypodense rounded lesion in left lateral hepatic lobe, similar 6 mm lesion in the superior right hepatic lobe and a smaller subcapsular lesion in the lateral right hepatic lobe; rectal mass within the lumen of the bowel emanating from the left wall measuring 4.9 x 3.5 cm and a mass within the low left perirectal fat measuring 2.3 x 3.2 cm consistent with local extension of carcinoma. Staging PET scan 09/18/2011 showed a 5.1 cm distal sigmoid/rectal mass with max SUV 19.2 with local extension into the left perirectal fat with max SUV 7.7. At least 2 suspected hepatic metastases with max SUV 6.7 in the lateral segment left hepatic lobe and max SUV 4.1 in the lateral right hepatic dome. Additional tiny hypodensities in the liver may be cysts; patchy/nodular opacity in the left lower lobe with max SUV 3.5 worrisome for pulmonary metastasis, less likely infectious. He began radiation and concurrent Xeloda chemotherapy on 09/29/2011. He completed radiation on 11/06/2011. MRI of the liver on 12/08/2011 showed 2 hypermetabolic liver lesions, similar in size to 09/03/2011. 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 Equivocal 7 mm lesion in the inferior right hepatic lobe. No evidence of extrahepatic metastasis within the abdomen. 2. Rectal pain and  bleeding secondary to #1. Improved. 3. Frequent loose stools secondary to #1. Improved. 4. Hypertension. 5. Tobacco use. 6. CT scan 09/03/2011 with chronic right hydronephrosis secondary to an obstructing calculus in the mid right ureter. He is followed by Dr. Brunilda Payor.  7. Status post cystoscopy, right retrograde pyelogram, ureteroscopy and insertion of right double-J stent 11/28/2011. 8. Elevated creatinine. Stable on labs 12/15/2011.  9. Hypokalemia-resolved. He is no longer taking  potassium. 10. Status post ultrasound-guided fine-needle aspiration of small left hepatic lesion 12/22/2011. Pathology pending.  Disposition-Mr. Meuth appears stable. If the recent FNA of the liver lesion is negative the plan is to proceed with surgery. We will contact Mr. Risko with the biopsy result. He has a followup appointment with Dr. Ezzard Standing 12/26/2011. We are scheduling a followup visit here in 4-5 weeks. We will adjust the appointment accordingly pending the biopsy result. Mr. Meegan will contact the office prior to his next visit with any problems.  Plan reviewed Dr. Truett Perna.  Lonna Cobb ANP/GNP-BC

## 2011-12-24 NOTE — H&P (Signed)
Agree with PA note. 

## 2011-12-26 ENCOUNTER — Telehealth: Payer: Self-pay | Admitting: *Deleted

## 2011-12-26 ENCOUNTER — Ambulatory Visit (INDEPENDENT_AMBULATORY_CARE_PROVIDER_SITE_OTHER): Payer: 59 | Admitting: Surgery

## 2011-12-26 ENCOUNTER — Encounter (INDEPENDENT_AMBULATORY_CARE_PROVIDER_SITE_OTHER): Payer: Self-pay | Admitting: Surgery

## 2011-12-26 DIAGNOSIS — N134 Hydroureter: Secondary | ICD-10-CM

## 2011-12-26 DIAGNOSIS — C20 Malignant neoplasm of rectum: Secondary | ICD-10-CM

## 2011-12-26 NOTE — Telephone Encounter (Signed)
Error

## 2011-12-26 NOTE — Progress Notes (Signed)
ASSESSMENT AND PLAN: 1.  Rectal Cancer.  He has completed neoadjuvant therapy by [name] [name] and [name] [name] end of Nov/beginning of Dec 2012.  His CEA has returned to normal.  An attempt to biopsy the liver lesion showed benign liver tissue (though there is a question whether the lesion was biopsied).  So it appears to be time to go ahead with a resection of his rectal tumor.  I discussed the planned surgery with the patient and his wife.  Risks include bleeding, infection, leak, and recurrent cancer.  [name] [name] will probably address the right kidney stone at the same time.  He will most likely need a ostomy (either protective ileostomy or colostomy).   I would like to sigmoidoscope him preop.  2.  Hepatic nodules.  ? Mets?  Needle aspiration of right lobe of liver by [name] [name] - 12/22/2011 - Benign.    3.  Right hydroureter secondary to nephrolithiasis.  Sees [name] [name].  I spoke to [name] [name]. He will try to address the right kidney stone at the same time.  I will also want bilateral ureteral stents.  4.  Hypertension. 5.  Smokes.  We talked about trying to quit before surgery.  Even cutting back would be good.   Chief Complaint  Patient presents with  . Follow-up    Rectal cancer   REFERRING PHYSICIAN:  Sheryn [name], M.D.  HISTORY OF PRESENT ILLNESS: [name] [name] is a 61 y.o. (DOB: May 07, 1951)  AA male whose primary care physician is [name] Mew, MD, MD and comes to me today for follow up of a rectal cancer.  The patient has had some rectal bleeding for about one year.   He saw [name] [name] who did a colonoscopy on him on 09/03/2011.  [name] [name] saw in [name] [name]'s rectum, there was a 5 cm villous/necrotic tumor.  Biopsy of the rectal tumor read by [name] [name] showed invasive adenocarcinoma (Case No.: [id number].)  The patient also had a CT scan the same day.  The CT scan showed: 1. Large 4 cm rectal mass. 2. Left perirectal mass is concerning for local extension of  the tumor. 3. Multiple hepatic hypodensities are concerning for hepatic metastasis. 4. Chronic right hydronephrosis secondary to a obstructing calculus in the mid right ureter.  He has completed neoadjuvant therapy by [name] [name] and [name] [name] end of Nov/beginning of Dec 2012.  His CEA has returned to normal.  An attempt to biopsy the liver lesion showed benign liver tissue (though there is a question whether the lesion was biopsied).  His wife is with him and I discussed about going ahead with surgery for the colon cancer.  Past Medical History  Diagnosis Date  . Hypertension   . Abnormal EKG 11/01/11    REPORT IN EPIC-PT STATES NO KNOWN HEART PROBLEMS-NO CHEST PAINS  . Chronic bronchitis   . Rectal pain   . Nasal congestion   . Blood in stool   . Dyspnea     intermittent with bronchitis  . Heart murmur     SINCE BIRTH--DOES NOT CAUSE ANY PROBLEMS  . COPD (chronic obstructive pulmonary disease)   . GERD (gastroesophageal reflux disease)   . 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 Low blood potassium     POTASSIUM WAS 2.1 ON 11/01/11 VISIT TO ER--PT PUT ON ORAL POTASSIUM--MOST RECENT LAB 11/24/11 POTASSIUM IMPROVED TO 3.1  . Rectal cancer     HAS COMPLETED RADIATION AND XELODA--PLAN RESTAGING TESTING.  [name]  . Hydronephrosis of right kidney     "Chronic" -SECONDARY TO CALCULUS MID RT URETER AND ADDITIONAL CALCULI WITHIN RT KIDNEY--PT STATES HE HAS PASSED A STONE IN THE PAST--DID NOT KNOW HE HAD MORE STONES UNTIL CT ABD DONE ON 09/03/11  . Cancer     rectal adenocarcinoma stage [id number]  . Rectal bleeding     hx 1 year thought was hemrrhoids  . History of radiation therapy 09/29/11-11/07/11    rectal ca    Past Surgical History  Procedure Date  . Wrist and hand surgery     LEFT WRIST AND RIGHT HAND--GANGLION LW  AND FX OF RT HAS  . Right hand surgery -repair fracture   . Removal ganglion cyst     Current Outpatient Prescriptions    Medication Sig Dispense Refill  . pantoprazole (PROTONIX) 40 MG tablet as needed.      Marland Kitchen amLODipine (NORVASC) 5 MG tablet Take 5 mg by mouth daily.       . bisoprolol-hydrochlorothiazide (ZIAC) 5-6.25 MG per tablet Take 1 tablet by mouth daily at 12 noon.       Marland Kitchen oxyCODONE-acetaminophen (PERCOCET) 5-325 MG per tablet Take 1-2 tablets by mouth every 6 (six) hours as needed.  50 tablet  0  . phenazopyridine (PYRIDIUM) 200 MG tablet         No Known Allergies  REVIEW OF SYSTEMS: Skin:  No history of rash.  No history of abnormal moles. Infection:  No history of hepatitis or HIV.  No history of MRSA. Neurologic:  No history of stroke.  No history of seizure.  No history of headaches. Cardiac:  Hypertension x 10 years. No history of heart disease.  No history of prior cardiac catheterization.  No history of seeing a cardiologist. Pulmonary:  Smokes cigarettes - 1-1 1/2 PPD. No OSA/CPAP.  Endocrine:  No diabetes. No thyroid disease. Gastrointestinal:  See HPI. Urologic:  History of kidney stones.  Sees [name] [name].  No history of bladder infections. Musculoskeletal:  Bilateral shoulder pain.  Not seeing anyone for this. Hematologic:  No bleeding disorder.  No history of anemia.  Not anticoagulated.  SOCIAL and FAMILY HISTORY:  [employer] driver  Married.  Wife is here.   No children. No family history of colon cancer.  PHYSICAL EXAM: BP 150/88  Pulse 72  Temp(Src) 98.2 F (36.8 C) (Temporal)  Resp 18  Ht 5\' 10"  (1.778 m)  Wt 172 lb (78.019 kg)  BMI 24.68 kg/m2  General: WN BM. HEENT: Normal. Pupils equal. Normal dentition. Neck: Supple. No thyroid mass. Lymph Nodes:  No supraclavicular or cervical nodes. Lungs: Clear and symmetric. Heart:  RRR. No murmur.  Abdomen: No mass. No tenderness. No hernia. Normal bowel sounds.  No abdominal scars. Rectal:  Somewhat tender.  Mild rectal spasm.  I am not sure that I can feel the tumor. Extremities:  Good strength in upper and lower  extremities. Neurologic:  Grossly intact to motor and sensory function.  DATA REVIEWED: Path report - benign - 12/22/2011 - from [name] [name] liver biopsy. CEA - 12/01/2011 - 1.6  (was 13.8 from October 2012) Creat - 1.59   Ovidio Kin, M.D., [facility], [place] [phone]

## 2011-12-26 NOTE — Patient Instructions (Signed)
1.  Sigmoidoscopy in office.  Use Fleets enema about 3 to 4 hours before coming to office.  2.  Dr. Brunilda Payor will talk to you about removing right sided kidney stone at the same time.  3.  Plan surgery to remove rectal cancer with ostomy.  4.  Do golytely and antibiotic bowel prep the day before surgery.

## 2012-01-06 ENCOUNTER — Other Ambulatory Visit: Payer: Self-pay | Admitting: Urology

## 2012-01-27 ENCOUNTER — Ambulatory Visit (HOSPITAL_BASED_OUTPATIENT_CLINIC_OR_DEPARTMENT_OTHER): Payer: 59 | Admitting: Oncology

## 2012-01-27 ENCOUNTER — Telehealth: Payer: Self-pay | Admitting: Oncology

## 2012-01-27 VITALS — BP 155/86 | HR 93 | Temp 98.6°F | Ht 70.0 in | Wt 176.9 lb

## 2012-01-27 DIAGNOSIS — C20 Malignant neoplasm of rectum: Secondary | ICD-10-CM

## 2012-01-27 DIAGNOSIS — I10 Essential (primary) hypertension: Secondary | ICD-10-CM

## 2012-01-27 DIAGNOSIS — F17200 Nicotine dependence, unspecified, uncomplicated: Secondary | ICD-10-CM

## 2012-01-27 NOTE — Progress Notes (Signed)
OFFICE PROGRESS NOTE   INTERVAL HISTORY:   He returns as scheduled. He denies rectal bleeding and pain. He complains of burning at the distal penis. Dr. Brunilda Payor placed a ureter stent. He is scheduled for the rectal surgery on 02/06/2012. He reports Dr. Brunilda Payor plans to address the renal stone during the same surgery.  Objective:  Vital signs in last 24 hours:  Blood pressure 155/86, pulse 93, temperature 98.6 F (37 C), height 5\' 10"  (1.778 m), weight 176 lb 14.4 oz (80.241 kg).    HEENT: Neck without mass. No thrush or ulcers Lymphatics: No cervical, supraclavicular, axillary, or inguinal nodes Resp: Distant breath sounds with and inspiratory bronchial sounds at the upper posterior chest bilaterally, no respiratory distress Cardio: Regular rhythm with frequent premature beats, a 2/6 systolic murmur GI: Nontender, no hepatomegaly Vascular: No leg edema    Lab Results:  Lab Results  Component Value Date   WBC 6.7 12/22/2011   HGB 12.7* 12/22/2011   HCT 36.3* 12/22/2011   MCV 102.0* 12/22/2011   PLT 223 12/22/2011      Medications: I have reviewed the patient's current medications.  Assessment/Plan: 1.  Rectal cancer status post biopsy of rectal mass 09/03/2011 with pathology showing invasive adenocarcinoma. Staging CT scans 09/03/2011 showed a 10 mm hypodense rounded lesion in left lateral hepatic lobe, similar 6 mm lesion in the superior right hepatic lobe and a smaller subcapsular lesion in the lateral right hepatic lobe; rectal mass within the lumen of the bowel emanating from the left wall measuring 4.9 x 3.5 cm and a mass within the low left perirectal fat measuring 2.3 x 3.2 cm consistent with local extension of carcinoma. Staging PET scan 09/18/2011 showed a 5.1 cm distal sigmoid/rectal mass with max SUV 19.2 with local extension into the left perirectal fat with max SUV 7.7. At least 2 suspected hepatic metastases with max SUV 6.7 in the lateral segment left hepatic lobe and max SUV  4.1 in the lateral right hepatic dome. Additional tiny hypodensities in the liver may be cysts; patchy/nodular opacity in the left lower lobe with max SUV 3.5 worrisome for pulmonary metastasis, less likely infectious. He began radiation and concurrent Xeloda chemotherapy on 09/29/2011. He completed radiation on 11/06/2011. MRI of the liver on 12/08/2011 showed 2 hypermetabolic liver lesions, similar in size to 09/03/2011. 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 Equivocal 7 mm lesion in the inferior right hepatic lobe. No evidence of extrahepatic metastasis within the abdomen.      -An ultrasound guided biopsy of a small left hepatic lesion on 12/23/2011 was nondiagnostic. 2. Rectal pain and bleeding secondary to #1. Resolved 3. Frequent loose stools secondary to #1. Resolved 4. Hypertension. 5. Tobacco use. 6. CT scan 09/03/2011 with chronic right hydronephrosis secondary to an obstructing calculus in the mid right ureter. He is followed by Dr. Brunilda Payor.  7. Status post cystoscopy, right retrograde pyelogram, ureteroscopy and insertion of right double-J stent 11/28/2011. 8. Elevated creatinine. Stable on labs 12/15/2011.  9. Hypokalemia-resolved. He is no longer taking potassium. 10. Status post ultrasound-guided fine-needle aspiration of small left hepatic lesion 12/22/2011. The pathology revealed no malignancy   Disposition:  He appears stable. He is scheduled for definitive surgery on 02/06/2012. He will return for an office visit here on 02/25/2012. We will make a decision on further systemic therapy based on the surgical pathology from the rectum and liver.   Lucile Shutters, MD  01/27/2012  10:11 AM

## 2012-01-27 NOTE — Telephone Encounter (Signed)
Gv pt appt for march2013 °

## 2012-01-29 ENCOUNTER — Ambulatory Visit (INDEPENDENT_AMBULATORY_CARE_PROVIDER_SITE_OTHER): Payer: 59 | Admitting: Surgery

## 2012-01-29 ENCOUNTER — Encounter (INDEPENDENT_AMBULATORY_CARE_PROVIDER_SITE_OTHER): Payer: Self-pay | Admitting: Surgery

## 2012-01-29 VITALS — BP 130/88 | HR 78 | Temp 97.6°F | Resp 18 | Ht 70.0 in | Wt 168.0 lb

## 2012-01-29 DIAGNOSIS — C20 Malignant neoplasm of rectum: Secondary | ICD-10-CM

## 2012-01-29 NOTE — Progress Notes (Signed)
Office sigmoidoscopy:  Patient had good prep.  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  His rectal pain is better.  He's having some trouble with urination.  He went for genetic evaluation, but never found the office. Dr. Jarold Motto questioned a MAP genetic syndrome because of multiple polyps in his left and sigmoid colon.  For surgical resection 02/06/2012.  Dr. Brunilda Payor to address right ureter at the same time.

## 2012-02-04 ENCOUNTER — Encounter (HOSPITAL_COMMUNITY)
Admission: RE | Admit: 2012-02-04 | Discharge: 2012-02-04 | Disposition: A | Discharge status: Home / Self Care | Payer: 59 | Source: Ambulatory Visit | Attending: Surgery | Admitting: Surgery

## 2012-02-04 ENCOUNTER — Encounter (HOSPITAL_COMMUNITY): Payer: Self-pay

## 2012-02-04 ENCOUNTER — Other Ambulatory Visit (INDEPENDENT_AMBULATORY_CARE_PROVIDER_SITE_OTHER): Payer: Self-pay | Admitting: Surgery

## 2012-02-04 ENCOUNTER — Encounter (HOSPITAL_COMMUNITY): Payer: Self-pay | Admitting: Pharmacy Technician

## 2012-02-04 HISTORY — DX: Sleep apnea, unspecified: G47.30

## 2012-02-04 LAB — CBC
Hemoglobin: 13.3 g/dL (ref 13.0–17.0)
MCH: 34.2 pg — ABNORMAL HIGH (ref 26.0–34.0)
Platelets: 246 10*3/uL (ref 150–400)
RBC: 3.89 MIL/uL — ABNORMAL LOW (ref 4.22–5.81)
WBC: 7.2 10*3/uL (ref 4.0–10.5)

## 2012-02-04 LAB — COMPREHENSIVE METABOLIC PANEL
ALT: 11 U/L (ref 0–53)
AST: 14 U/L (ref 0–37)
Alkaline Phosphatase: 121 U/L — ABNORMAL HIGH (ref 39–117)
CO2: 26 mEq/L (ref 19–32)
Calcium: 8.9 mg/dL (ref 8.4–10.5)
Chloride: 106 mEq/L (ref 96–112)
GFR calc Af Amer: 58 mL/min — ABNORMAL LOW (ref >90)
GFR calc non Af Amer: 50 mL/min — ABNORMAL LOW (ref >90)
Glucose, Bld: 104 mg/dL — ABNORMAL HIGH (ref 70–99)
Potassium: 3.5 mEq/L (ref 3.5–5.1)
Sodium: 140 mEq/L (ref 135–145)
Total Bilirubin: 0.3 mg/dL (ref 0.3–1.2)

## 2012-02-04 LAB — DIFFERENTIAL
Lymphocytes Relative: 14 % (ref 12–46)
Lymphs Abs: 1 10*3/uL (ref 0.7–4.0)
Monocytes Relative: 7 % (ref 3–12)
Neutro Abs: 5.7 10*3/uL (ref 1.7–7.7)
Neutrophils Relative %: 78 % — ABNORMAL HIGH (ref 43–77)

## 2012-02-04 NOTE — Patient Instructions (Signed)
20 [name] [name]  02/04/2012   Your procedure is scheduled on:  02-06-2012  Report to [facility] at  0530 AM.  Call this number if you have problems the morning of surgery: [phone]   Remember:   Clear liquids per dr Ezzard Standing bowel prep instructions, no clear liquids:After Midnight.  .  Take these medicines the morning of surgery with A SIP OF WATER: norvasc, oxycodone if needed   Do not wear jewelry or make up.  Do not wear lotions, powders, or perfumes.Do not wear deodorant.    Do not bring valuables to the hospital.  Contacts, dentures or bridgework may not be worn into surgery.  Leave suitcase in the car. After surgery it may be brought to your room.  For patients admitted to the hospital, checkout time is 11:00 AM the day of discharge.     Special Instructions: CHG Shower Use Special Wash: 1/2 bottle night before surgery and 1/2 bottle morning of surgery.neck down avoid private area   Please read over the following fact sheets that you were given: MRSA Information, blood fact sheet  [name] [facility] pre op nurse phone number [phone], call if needed

## 2012-02-04 NOTE — Pre-Procedure Instructions (Signed)
Spoke with cindy smithey lpn left message for dr Ezzard Standing to enter pre op orders into epic 11-01-2011 ekg in epic 09-02-2012 chest ct in epic

## 2012-02-04 NOTE — Progress Notes (Signed)
02/04/12 1058  OBSTRUCTIVE SLEEP APNEA  Have you ever been diagnosed with sleep apnea through a sleep study? No  Do you snore loudly (loud enough to be heard through closed doors)?  1  Do you often feel tired, fatigued, or sleepy during the daytime? 1  Has anyone observed you stop breathing during your sleep? 1  Do you have, or are you being treated for high blood pressure? 1  BMI more than 35 kg/m2? 0  Age over 63 years old? 1  Neck circumference greater than 40 cm/18 inches? 0  Gender: 1  Obstructive Sleep Apnea Score 6   Score 4 or greater  Updated health history;Results sent to PCP

## 2012-02-04 NOTE — Progress Notes (Signed)
02/04/12 1058  OBSTRUCTIVE SLEEP APNEA  Have you ever been diagnosed with sleep apnea through a sleep study? No  Do you snore loudly (loud enough to be heard through closed doors)?  1  Do you often feel tired, fatigued, or sleepy during the daytime? 1  Has anyone observed you stop breathing during your sleep? 1  Do you have, or are you being treated for high blood pressure? 1  BMI more than 35 kg/m2? 0  Age over 63 years old? 1  Neck circumference greater than 40 cm/18 inches? 0  Gender: 1  Obstructive Sleep Apnea Score 6   Score 4 or greater  Updated health history;Results sent to PCP    

## 2012-02-06 ENCOUNTER — Encounter (HOSPITAL_COMMUNITY): Payer: Self-pay | Admitting: *Deleted

## 2012-02-06 ENCOUNTER — Encounter (HOSPITAL_COMMUNITY): Payer: Self-pay | Admitting: Anesthesiology

## 2012-02-06 ENCOUNTER — Inpatient Hospital Stay (HOSPITAL_COMMUNITY)
Admission: RE | Admit: 2012-02-06 | Discharge: 2012-02-12 | DRG: 330 | Disposition: A | Discharge status: Home Health | Payer: 59 | Source: Ambulatory Visit | Attending: Surgery | Admitting: Surgery

## 2012-02-06 ENCOUNTER — Encounter (HOSPITAL_COMMUNITY)
Admission: RE | Disposition: A | Discharge status: Home Health | Payer: Self-pay | Source: Ambulatory Visit | Attending: Surgery

## 2012-02-06 ENCOUNTER — Inpatient Hospital Stay (HOSPITAL_COMMUNITY): Payer: 59 | Admitting: Anesthesiology

## 2012-02-06 DIAGNOSIS — C20 Malignant neoplasm of rectum: Principal | ICD-10-CM

## 2012-02-06 DIAGNOSIS — C779 Secondary and unspecified malignant neoplasm of lymph node, unspecified: Secondary | ICD-10-CM | POA: Diagnosis present

## 2012-02-06 DIAGNOSIS — N200 Calculus of kidney: Secondary | ICD-10-CM | POA: Diagnosis present

## 2012-02-06 DIAGNOSIS — Z923 Personal history of irradiation: Secondary | ICD-10-CM

## 2012-02-06 DIAGNOSIS — N134 Hydroureter: Secondary | ICD-10-CM | POA: Diagnosis present

## 2012-02-06 DIAGNOSIS — C787 Secondary malignant neoplasm of liver and intrahepatic bile duct: Secondary | ICD-10-CM | POA: Diagnosis present

## 2012-02-06 DIAGNOSIS — J4489 Other specified chronic obstructive pulmonary disease: Secondary | ICD-10-CM | POA: Diagnosis present

## 2012-02-06 DIAGNOSIS — N201 Calculus of ureter: Secondary | ICD-10-CM

## 2012-02-06 DIAGNOSIS — K219 Gastro-esophageal reflux disease without esophagitis: Secondary | ICD-10-CM | POA: Diagnosis present

## 2012-02-06 DIAGNOSIS — I10 Essential (primary) hypertension: Secondary | ICD-10-CM | POA: Diagnosis present

## 2012-02-06 DIAGNOSIS — E871 Hypo-osmolality and hyponatremia: Secondary | ICD-10-CM | POA: Diagnosis not present

## 2012-02-06 DIAGNOSIS — Z9221 Personal history of antineoplastic chemotherapy: Secondary | ICD-10-CM

## 2012-02-06 DIAGNOSIS — N1330 Unspecified hydronephrosis: Secondary | ICD-10-CM

## 2012-02-06 DIAGNOSIS — J449 Chronic obstructive pulmonary disease, unspecified: Secondary | ICD-10-CM | POA: Diagnosis present

## 2012-02-06 DIAGNOSIS — Z01812 Encounter for preprocedural laboratory examination: Secondary | ICD-10-CM

## 2012-02-06 DIAGNOSIS — R7309 Other abnormal glucose: Secondary | ICD-10-CM | POA: Diagnosis not present

## 2012-02-06 HISTORY — PX: LAPAROTOMY: SHX154

## 2012-02-06 HISTORY — PX: URETEROLITHOTOMY: SHX71

## 2012-02-06 HISTORY — PX: APPENDECTOMY: SHX54

## 2012-02-06 LAB — DIFFERENTIAL
Eosinophils Absolute: 0 10*3/uL (ref 0.0–0.7)
Eosinophils Relative: 0 % (ref 0–5)
Lymphocytes Relative: 4 % — ABNORMAL LOW (ref 12–46)
Lymphs Abs: 0.5 10*3/uL — ABNORMAL LOW (ref 0.7–4.0)
Monocytes Relative: 2 % — ABNORMAL LOW (ref 3–12)
Neutrophils Relative %: 94 % — ABNORMAL HIGH (ref 43–77)

## 2012-02-06 LAB — TYPE AND SCREEN
ABO/RH(D): O POS
Antibody Screen: NEGATIVE

## 2012-02-06 LAB — BASIC METABOLIC PANEL
BUN: 19 mg/dL (ref 6–23)
Chloride: 103 mEq/L (ref 96–112)
Glucose, Bld: 198 mg/dL — ABNORMAL HIGH (ref 70–99)
Potassium: 3.6 mEq/L (ref 3.5–5.1)
Sodium: 135 mEq/L (ref 135–145)

## 2012-02-06 LAB — CBC
Hemoglobin: 13.3 g/dL (ref 13.0–17.0)
MCH: 34.1 pg — ABNORMAL HIGH (ref 26.0–34.0)
MCV: 98.5 fL (ref 78.0–100.0)
RBC: 3.9 MIL/uL — ABNORMAL LOW (ref 4.22–5.81)
WBC: 11.4 10*3/uL — ABNORMAL HIGH (ref 4.0–10.5)

## 2012-02-06 SURGERY — RESECTION, RECTUM, LOW ANTERIOR, LAPAROSCOPIC
Anesthesia: General | Site: Ureter | Laterality: Right

## 2012-02-06 MED ORDER — ALVIMOPAN 12 MG PO CAPS
12.0000 mg | ORAL_CAPSULE | Freq: Two times a day (BID) | ORAL | Status: DC
  Administered 2012-02-07 – 2012-02-12 (×11): 12 mg via ORAL
  Filled 2012-02-06 (×12): qty 1

## 2012-02-06 MED ORDER — KCL IN DEXTROSE-NACL 20-5-0.45 MEQ/L-%-% IV SOLN
INTRAVENOUS | Status: DC
  Administered 2012-02-06: 1000 mL via INTRAVENOUS
  Administered 2012-02-07: via INTRAVENOUS
  Filled 2012-02-06 (×4): qty 1000

## 2012-02-06 MED ORDER — ONDANSETRON HCL 4 MG/2ML IJ SOLN: 4.0000 mg | Freq: Four times a day (QID) | INTRAMUSCULAR | Status: DC | PRN

## 2012-02-06 MED ORDER — BUPIVACAINE LIPOSOME 1.3 % IJ SUSP
20.0000 mL | INTRAMUSCULAR | Status: AC
  Administered 2012-02-06: 20 mL
  Filled 2012-02-06: qty 20

## 2012-02-06 MED ORDER — IOHEXOL 300 MG/ML  SOLN
INTRAMUSCULAR | Status: AC
  Filled 2012-02-06: qty 1

## 2012-02-06 MED ORDER — DIPHENHYDRAMINE HCL 50 MG/ML IJ SOLN: 12.5000 mg | Freq: Four times a day (QID) | INTRAMUSCULAR | Status: DC | PRN

## 2012-02-06 MED ORDER — PHENYLEPHRINE HCL 10 MG/ML IJ SOLN
INTRAMUSCULAR | Status: DC | PRN
  Administered 2012-02-06 (×2): 50 ug via INTRAVENOUS

## 2012-02-06 MED ORDER — ONDANSETRON HCL 4 MG/2ML IJ SOLN
4.0000 mg | Freq: Four times a day (QID) | INTRAMUSCULAR | Status: DC | PRN
  Administered 2012-02-09 – 2012-02-10 (×2): 4 mg via INTRAVENOUS
  Filled 2012-02-06 (×2): qty 2

## 2012-02-06 MED ORDER — PROPOFOL 10 MG/ML IV EMUL
INTRAVENOUS | Status: DC | PRN
  Administered 2012-02-06: 175 mg via INTRAVENOUS

## 2012-02-06 MED ORDER — KETOROLAC TROMETHAMINE 30 MG/ML IJ SOLN: 15.0000 mg | Freq: Once | INTRAMUSCULAR | Status: DC | PRN

## 2012-02-06 MED ORDER — LACTATED RINGERS IR SOLN
Status: DC | PRN
  Administered 2012-02-06: 1000 mL

## 2012-02-06 MED ORDER — HEPARIN SODIUM (PORCINE) 5000 UNIT/ML IJ SOLN
5000.0000 [IU] | Freq: Three times a day (TID) | INTRAMUSCULAR | Status: DC
  Administered 2012-02-06 – 2012-02-12 (×16): 5000 [IU] via SUBCUTANEOUS
  Filled 2012-02-06 (×20): qty 1

## 2012-02-06 MED ORDER — SODIUM CHLORIDE 0.9 % IJ SOLN
INTRAMUSCULAR | Status: DC | PRN
  Administered 2012-02-06: 10 mL via INTRAVENOUS

## 2012-02-06 MED ORDER — EPHEDRINE SULFATE 50 MG/ML IJ SOLN
INTRAMUSCULAR | Status: DC | PRN
  Administered 2012-02-06: 5 mg via INTRAVENOUS
  Administered 2012-02-06: 10 mg via INTRAVENOUS
  Administered 2012-02-06: 5 mg via INTRAVENOUS
  Administered 2012-02-06: 10 mg via INTRAVENOUS
  Administered 2012-02-06 (×2): 5 mg via INTRAVENOUS
  Administered 2012-02-06: 10 mg via INTRAVENOUS

## 2012-02-06 MED ORDER — MORPHINE SULFATE (PF) 1 MG/ML IV SOLN
INTRAVENOUS | Status: AC
  Filled 2012-02-06: qty 25

## 2012-02-06 MED ORDER — DEXAMETHASONE SODIUM PHOSPHATE 4 MG/ML IJ SOLN
INTRAMUSCULAR | Status: DC | PRN
  Administered 2012-02-06: 10 mg via INTRAVENOUS

## 2012-02-06 MED ORDER — FUROSEMIDE 10 MG/ML IJ SOLN
10.0000 mg | Freq: Once | INTRAMUSCULAR | Status: DC
  Filled 2012-02-06 (×2): qty 1

## 2012-02-06 MED ORDER — PROMETHAZINE HCL 25 MG/ML IJ SOLN: 6.2500 mg | INTRAMUSCULAR | Status: DC | PRN

## 2012-02-06 MED ORDER — SODIUM CHLORIDE 0.9 % IR SOLN
Status: DC | PRN
  Administered 2012-02-06: 3000 mL

## 2012-02-06 MED ORDER — KCL IN DEXTROSE-NACL 20-5-0.45 MEQ/L-%-% IV SOLN
INTRAVENOUS | Status: AC
  Filled 2012-02-06: qty 1000

## 2012-02-06 MED ORDER — ACETAMINOPHEN 10 MG/ML IV SOLN
INTRAVENOUS | Status: AC
  Filled 2012-02-06: qty 100

## 2012-02-06 MED ORDER — 0.9 % SODIUM CHLORIDE (POUR BTL) OPTIME
TOPICAL | Status: DC | PRN
  Administered 2012-02-06: 4000 mL

## 2012-02-06 MED ORDER — DEXTROSE 5 % IV SOLN
1.0000 g | Freq: Four times a day (QID) | INTRAVENOUS | Status: AC
  Administered 2012-02-06 – 2012-02-07 (×3): 1 g via INTRAVENOUS
  Filled 2012-02-06 (×3): qty 1

## 2012-02-06 MED ORDER — LACTATED RINGERS IV SOLN
INTRAVENOUS | Status: DC | PRN
  Administered 2012-02-06 (×4): via INTRAVENOUS

## 2012-02-06 MED ORDER — ONDANSETRON HCL 4 MG PO TABS: 4.0000 mg | ORAL_TABLET | Freq: Four times a day (QID) | ORAL | Status: DC | PRN

## 2012-02-06 MED ORDER — GLYCOPYRROLATE 0.2 MG/ML IJ SOLN
INTRAMUSCULAR | Status: DC | PRN
  Administered 2012-02-06: .6 mg via INTRAVENOUS

## 2012-02-06 MED ORDER — HEPARIN SODIUM (PORCINE) 5000 UNIT/ML IJ SOLN
5000.0000 [IU] | Freq: Once | INTRAMUSCULAR | Status: AC
  Administered 2012-02-06: 5000 [IU] via SUBCUTANEOUS

## 2012-02-06 MED ORDER — DEXTROSE 5 % IV SOLN
1.0000 g | INTRAVENOUS | Status: AC
  Administered 2012-02-06 (×2): 1 g via INTRAVENOUS

## 2012-02-06 MED ORDER — NALOXONE HCL 0.4 MG/ML IJ SOLN: 0.4000 mg | INTRAMUSCULAR | Status: DC | PRN

## 2012-02-06 MED ORDER — ONDANSETRON HCL 4 MG/2ML IJ SOLN
INTRAMUSCULAR | Status: DC | PRN
  Administered 2012-02-06: 4 mg via INTRAVENOUS

## 2012-02-06 MED ORDER — HYDROMORPHONE HCL PF 1 MG/ML IJ SOLN: 0.2500 mg | INTRAMUSCULAR | Status: DC | PRN

## 2012-02-06 MED ORDER — NEOSTIGMINE METHYLSULFATE 1 MG/ML IJ SOLN
INTRAMUSCULAR | Status: DC | PRN
  Administered 2012-02-06: 4 mg via INTRAVENOUS

## 2012-02-06 MED ORDER — SUFENTANIL CITRATE 50 MCG/ML IV SOLN
INTRAVENOUS | Status: DC | PRN
  Administered 2012-02-06 (×8): 10 ug via INTRAVENOUS
  Administered 2012-02-06: 20 ug via INTRAVENOUS

## 2012-02-06 MED ORDER — MORPHINE SULFATE (PF) 1 MG/ML IV SOLN
INTRAVENOUS | Status: DC
Start: 2012-02-06 — End: 2012-02-10
  Administered 2012-02-06: 1 mg via INTRAVENOUS
  Administered 2012-02-07 – 2012-02-08 (×4): 1.5 mg via INTRAVENOUS

## 2012-02-06 MED ORDER — SODIUM CHLORIDE 0.9 % IJ SOLN: 9.0000 mL | INTRAMUSCULAR | Status: DC | PRN

## 2012-02-06 MED ORDER — BISOPROLOL FUMARATE 5 MG PO TABS
5.0000 mg | ORAL_TABLET | Freq: Once | ORAL | Status: AC
  Administered 2012-02-06: 5 mg via ORAL
  Filled 2012-02-06: qty 1

## 2012-02-06 MED ORDER — HYDROMORPHONE HCL PF 1 MG/ML IJ SOLN
INTRAMUSCULAR | Status: DC | PRN
  Administered 2012-02-06 (×2): .4 mg via INTRAVENOUS
  Administered 2012-02-06: .2 mg via INTRAVENOUS
  Administered 2012-02-06 (×2): .4 mg via INTRAVENOUS

## 2012-02-06 MED ORDER — LIDOCAINE HCL (CARDIAC) 20 MG/ML IV SOLN
INTRAVENOUS | Status: DC | PRN
  Administered 2012-02-06: 100 mg via INTRAVENOUS

## 2012-02-06 MED ORDER — HYDROMORPHONE HCL PF 1 MG/ML IJ SOLN
INTRAMUSCULAR | Status: AC
  Filled 2012-02-06: qty 1

## 2012-02-06 MED ORDER — ACETAMINOPHEN 10 MG/ML IV SOLN
INTRAVENOUS | Status: DC | PRN
  Administered 2012-02-06: 1000 mg via INTRAVENOUS

## 2012-02-06 MED ORDER — CEFOXITIN SODIUM-DEXTROSE 1-4 GM-% IV SOLR (PREMIX)
INTRAVENOUS | Status: AC
  Filled 2012-02-06: qty 50

## 2012-02-06 MED ORDER — CISATRACURIUM BESYLATE 2 MG/ML IV SOLN
INTRAVENOUS | Status: DC | PRN
  Administered 2012-02-06: 2 mg via INTRAVENOUS
  Administered 2012-02-06: 12 mg via INTRAVENOUS
  Administered 2012-02-06: 8 mg via INTRAVENOUS
  Administered 2012-02-06 (×2): 4 mg via INTRAVENOUS
  Administered 2012-02-06: 6 mg via INTRAVENOUS

## 2012-02-06 MED ORDER — FUROSEMIDE 10 MG/ML IJ SOLN
INTRAMUSCULAR | Status: DC | PRN
  Administered 2012-02-06: 10 mg via INTRAMUSCULAR

## 2012-02-06 MED ORDER — KETAMINE HCL 10 MG/ML IJ SOLN
INTRAMUSCULAR | Status: DC | PRN
  Administered 2012-02-06 (×2): 5 mg via INTRAVENOUS
  Administered 2012-02-06: 30 mg via INTRAVENOUS
  Administered 2012-02-06: 10 mg via INTRAVENOUS
  Administered 2012-02-06: 5 mg via INTRAVENOUS
  Administered 2012-02-06: 10 mg via INTRAVENOUS
  Administered 2012-02-06 (×3): 5 mg via INTRAVENOUS

## 2012-02-06 MED ORDER — DIPHENHYDRAMINE HCL 12.5 MG/5ML PO ELIX: 12.5000 mg | ORAL_SOLUTION | Freq: Four times a day (QID) | ORAL | Status: DC | PRN

## 2012-02-06 MED ORDER — MIDAZOLAM HCL 5 MG/5ML IJ SOLN
INTRAMUSCULAR | Status: DC | PRN
  Administered 2012-02-06: 2 mg via INTRAVENOUS

## 2012-02-06 MED ORDER — ALVIMOPAN 12 MG PO CAPS
12.0000 mg | ORAL_CAPSULE | Freq: Once | ORAL | Status: AC
  Administered 2012-02-06: 12 mg via ORAL

## 2012-02-06 MED ORDER — LACTATED RINGERS IV SOLN
INTRAVENOUS | Status: DC | PRN
  Administered 2012-02-06 (×3): via INTRAVENOUS

## 2012-02-06 MED ORDER — HETASTARCH-ELECTROLYTES 6 % IV SOLN
INTRAVENOUS | Status: DC | PRN
  Administered 2012-02-06: 08:00:00 via INTRAVENOUS

## 2012-02-06 SURGICAL SUPPLY — 102 items
ADAPTER GOLDBERG URETERAL (ADAPTER) ×2 IMPLANT
ADH SKN CLS APL DERMABOND .7 (GAUZE/BANDAGES/DRESSINGS)
ADPR CATH 15X14FR FL DRN BG (ADAPTER) ×6
ADPR CATH 6FR SDARM STRL DISP (MISCELLANEOUS) ×6
APPLIER CLIP 5 13 M/L LIGAMAX5 (MISCELLANEOUS)
APPLIER CLIP ROT 10 11.4 M/L (STAPLE)
APR CLP MED LRG 11.4X10 (STAPLE)
APR CLP MED LRG 5 ANG JAW (MISCELLANEOUS)
BAG URINE DRAINAGE (UROLOGICAL SUPPLIES) ×2 IMPLANT
BLADE EXTENDED COATED 6.5IN (ELECTRODE) IMPLANT
BLADE HEX COATED 2.75 (ELECTRODE) ×7 IMPLANT
BLADE SURG SZ10 CARB STEEL (BLADE) ×14 IMPLANT
CANISTER SUCTION 2500CC (MISCELLANEOUS) ×9 IMPLANT
CANNULA ENDOPATH XCEL 11M (ENDOMECHANICALS) IMPLANT
CATH FOLEY 2WAY SLVR  5CC 16FR (CATHETERS) ×1
CATH FOLEY 2WAY SLVR 5CC 16FR (CATHETERS) ×1 IMPLANT
CATH INTERMIT  6FR 70CM (CATHETERS) ×2 IMPLANT
CATH ROBINSON RED A/P 22FR (CATHETERS) ×2 IMPLANT
CELLS DAT CNTRL 66122 CELL SVR (MISCELLANEOUS) IMPLANT
CHLORAPREP W/TINT 26ML (MISCELLANEOUS) ×2 IMPLANT
CLIP APPLIE 5 13 M/L LIGAMAX5 (MISCELLANEOUS) IMPLANT
CLIP APPLIE ROT 10 11.4 M/L (STAPLE) IMPLANT
CLOTH BEACON ORANGE TIMEOUT ST (SAFETY) ×7 IMPLANT
COVER MAYO STAND STRL (DRAPES) ×7 IMPLANT
CUTTER FLEX LINEAR 45M (STAPLE) IMPLANT
DECANTER SPIKE VIAL GLASS SM (MISCELLANEOUS) ×5 IMPLANT
DERMABOND ADVANCED (GAUZE/BANDAGES/DRESSINGS)
DERMABOND ADVANCED .7 DNX12 (GAUZE/BANDAGES/DRESSINGS) IMPLANT
DRAIN CHANNEL RND F F (WOUND CARE) ×2 IMPLANT
DRAPE CAMERA CLOSED 9X96 (DRAPES) ×2 IMPLANT
DRAPE LAPAROSCOPIC ABDOMINAL (DRAPES) ×7 IMPLANT
DRAPE LG THREE QUARTER DISP (DRAPES) ×4 IMPLANT
DRAPE WARM FLUID 44X44 (DRAPE) ×7 IMPLANT
DRESSING TELFA 8X3 (GAUZE/BANDAGES/DRESSINGS) IMPLANT
DRSG PAD ABDOMINAL 8X10 ST (GAUZE/BANDAGES/DRESSINGS) ×2 IMPLANT
ELECT BLADE 6.5 EXT (BLADE) ×2 IMPLANT
ELECT REM PT RETURN 9FT ADLT (ELECTROSURGICAL) ×7
ELECTRODE REM PT RTRN 9FT ADLT (ELECTROSURGICAL) ×6 IMPLANT
EVACUATOR SILICONE 100CC (DRAIN) ×2 IMPLANT
FILTER SMOKE EVAC LAPAROSHD (FILTER) ×2 IMPLANT
GLOVE SURG SIGNA 7.5 PF LTX (GLOVE) ×14 IMPLANT
GOWN STRL NON-REIN LRG LVL3 (GOWN DISPOSABLE) ×7 IMPLANT
GOWN STRL REIN XL XLG (GOWN DISPOSABLE) ×22 IMPLANT
GUIDEWIRE STR DUAL SENSOR (WIRE) ×4 IMPLANT
KIT BASIN OR (CUSTOM PROCEDURE TRAY) ×7 IMPLANT
LEGGING LITHOTOMY PAIR STRL (DRAPES) ×2 IMPLANT
LIGASURE IMPACT 36 18CM CVD LR (INSTRUMENTS) ×2 IMPLANT
NS IRRIG 1000ML POUR BTL (IV SOLUTION) ×9 IMPLANT
PENCIL BUTTON HOLSTER BLD 10FT (ELECTRODE) ×7 IMPLANT
RADIFOCUS GLIDEWIRE ×2 IMPLANT
RELOAD 45 VASCULAR/THIN (ENDOMECHANICALS) IMPLANT
RELOAD BLUE (STAPLE) ×5 IMPLANT
RELOAD STAPLE 45 2.5 WHT GRN (ENDOMECHANICALS) IMPLANT
RELOAD STAPLE 45 3.5 BLU ETS (ENDOMECHANICALS) IMPLANT
RELOAD STAPLE TA45 3.5 REG BLU (ENDOMECHANICALS) IMPLANT
RETRACTOR WND ALEXIS 18 MED (MISCELLANEOUS) IMPLANT
RTRCTR WOUND ALEXIS 18CM MED (MISCELLANEOUS)
SCALPEL HARMONIC ACE (MISCELLANEOUS) ×2 IMPLANT
SET IRRIG TUBING LAPAROSCOPIC (IRRIGATION / IRRIGATOR) ×2 IMPLANT
SET IRRIG Y TYPE TUR BLADDER L (SET/KITS/TRAYS/PACK) ×2 IMPLANT
SHEATH URET ACCESS 12FR/35CM (UROLOGICAL SUPPLIES) ×2 IMPLANT
SOLUTION ANTI FOG 6CC (MISCELLANEOUS) ×7 IMPLANT
SPONGE GAUZE 4X4 12PLY (GAUZE/BANDAGES/DRESSINGS) ×7 IMPLANT
SPONGE LAP 18X18 X RAY DECT (DISPOSABLE) ×18 IMPLANT
STAPLE ECHEON FLEX 60 POW ENDO (STAPLE) IMPLANT
STAPLER CIRC CVD 29MM 37CM (STAPLE) ×2 IMPLANT
STAPLER CUT CVD 40MM BLUE (STAPLE) ×2 IMPLANT
STAPLER PROXIMATE 75MM BLUE (STAPLE) ×2 IMPLANT
STAPLER VISISTAT 35W (STAPLE) ×2 IMPLANT
STENT CONTOUR 6FRX24X.038 (STENTS) ×2 IMPLANT
STRIP CLOSURE SKIN 1/2X4 (GAUZE/BANDAGES/DRESSINGS) IMPLANT
SUCTION POOLE TIP (SUCTIONS) ×7 IMPLANT
SUT ETHILON 2 0 PS N (SUTURE) ×4 IMPLANT
SUT MON AB 5-0 PS2 18 (SUTURE) ×5 IMPLANT
SUT NOVA 1 T20/GS 25DT (SUTURE) ×2 IMPLANT
SUT NOVA NAB DX-16 0-1 5-0 T12 (SUTURE) IMPLANT
SUT PDS AB 1 TP1 54 (SUTURE) ×4 IMPLANT
SUT PROLENE 2 0 KS (SUTURE) IMPLANT
SUT PROLENE 2 0 SH DA (SUTURE) ×2 IMPLANT
SUT SILK 2 0 (SUTURE) ×7
SUT SILK 2 0 SH CR/8 (SUTURE) ×2 IMPLANT
SUT SILK 2-0 18XBRD TIE 12 (SUTURE) ×6 IMPLANT
SUT SILK 3 0 (SUTURE)
SUT SILK 3 0 SH CR/8 (SUTURE) ×2 IMPLANT
SUT SILK 3-0 18XBRD TIE 12 (SUTURE) IMPLANT
SUT VIC AB 0 CT1 36 (SUTURE) ×2 IMPLANT
SUT VIC AB 2-0 SH 27 (SUTURE) ×7
SUT VIC AB 2-0 SH 27X BRD (SUTURE) ×1 IMPLANT
SYR BULB IRRIGATION 50ML (SYRINGE) ×7 IMPLANT
TAPE CLOTH SURG 4X10 WHT LF (GAUZE/BANDAGES/DRESSINGS) ×2 IMPLANT
TOWEL OR 17X26 10 PK STRL BLUE (TOWEL DISPOSABLE) ×9 IMPLANT
TOWEL OR NON WOVEN STRL DISP B (DISPOSABLE) ×4 IMPLANT
TRAY FOLEY CATH 14FRSI W/METER (CATHETERS) ×5 IMPLANT
TRAY LAP CHOLE (CUSTOM PROCEDURE TRAY) ×7 IMPLANT
TROCAR BLADELESS OPT 5 75 (ENDOMECHANICALS) ×13 IMPLANT
TROCAR XCEL 12X100 BLDLESS (ENDOMECHANICALS) IMPLANT
TROCAR XCEL BLUNT TIP 100MML (ENDOMECHANICALS) IMPLANT
TROCAR XCEL NON-BLD 11X100MML (ENDOMECHANICALS) ×2 IMPLANT
TUBING FILTER THERMOFLATOR (ELECTROSURGICAL) ×7 IMPLANT
VALVE ENDOSCOPIC W/ADAPTER (MISCELLANEOUS) ×2 IMPLANT
YANKAUER SUCT BULB TIP 10FT TU (MISCELLANEOUS) ×7 IMPLANT
YANKAUER SUCT BULB TIP NO VENT (SUCTIONS) ×7 IMPLANT

## 2012-02-06 NOTE — Anesthesia Procedure Notes (Signed)
Procedure Name: Intubation Date/Time: 02/06/2012 7:44 AM Performed by: Lurlean Leyden, Dylann Layne L. Patient Re-evaluated:Patient Re-evaluated prior to inductionOxygen Delivery Method: Circle system utilized Preoxygenation: Pre-oxygenation with 100% oxygen Intubation Type: IV induction Ventilation: Mask ventilation without difficulty and Oral airway inserted - appropriate to patient size Laryngoscope Size: Miller and 3 Grade View: Grade II Tube size: 8.0 mm Number of attempts: 2 Airway Equipment and Method: Stylet Placement Confirmation: ETT inserted through vocal cords under direct vision,  breath sounds checked- equal and bilateral and positive ETCO2 Secured at: 22 cm Tube secured with: Tape Dental Injury: Teeth and Oropharynx as per pre-operative assessment

## 2012-02-06 NOTE — Transfer of Care (Signed)
Immediate Anesthesia Transfer of Care Note  Patient: [name] [name]  Procedure(s) Performed: Procedure(s) (LRB): LAPAROSCOPIC LOW ANTERIOR RESECTION (N/A) LAPAROSCOPIC LIVER ULTRASOUND (N/A) OSTOMY (N/A) CYSTOSCOPY WITH STENT PLACEMENT (Left) APPENDECTOMY () EXPLORATORY LAPAROTOMY () URETEROLITHOTOMY (Right)  Patient Location: PACU  Anesthesia Type: General  Level of Consciousness: sedated  Airway & Oxygen Therapy: Patient Spontanous Breathing and Patient connected to face mask oxygen  Post-op Assessment: Report given to PACU RN and Post -op Vital signs reviewed and stable  Post vital signs: Reviewed and stable  Complications: No apparent anesthesia complications

## 2012-02-06 NOTE — Brief Op Note (Signed)
02/06/2012  2:18 PM  PATIENT:  [name] [name], 61 y.o., male, MRN: [id number]  PREOP DIAGNOSIS:  rectal cancer  POSTOP DIAGNOSIS:   Rectal cancer (approx. 10 cm from anal verge),  Probable met to mid left lobe and mid right lobe, scarred right ureter  PROCEDURE:   Procedure(s): LOW ANTERIOR RESECTION, mobilization of the splenic flexure, LAPAROSCOPIC LIVER ULTRASOUND, Protective loop ileostomy, CYSTOSCOPY WITH STENT PLACEMENT APPENDECTOMY, URETEROLITHOTOMY  SURGEON:   Ovidio Kin, M.D.  ASSISTANTAbbey Chatters, M.D. and Magnus Ivan, M.D.  ANESTHESIA:   general  Diana L. Lurlean Leyden, CRNA - CRNA Riesa Pope, MD - Anesthesiologist Clydene Pugh [place], CRNA - CRNA Randon Goldsmith - CRNA Elisabeth Cara, CRNA - CRNA  General  EBL:  300  ml  BLOOD ADMINISTERED: none  DRAINS: none   LOCAL MEDICATIONS USED:   20 cc Experel  SPECIMEN:   Distal sigmoid colon/proximal rectum (suture marks proximal end), appendix, EEA rings  COUNTS CORRECT:  YES  INDICATIONS FOR PROCEDURE:  [name] [name] is a 61 y.o. (DOB: 03-May-1951) AA male whose primary care physician is Carrie Mew, MD, MD and comes for low anterior resection and evaluation of possible liver mets.   The indications and risks of the surgery were explained to the patient.  The risks include, but are not limited to, infection, bleeding, and nerve injury.  Note dictated to:   #[id number]

## 2012-02-06 NOTE — Anesthesia Postprocedure Evaluation (Signed)
  Anesthesia Post-op Note  Patient: [name] [name]  Procedure(s) Performed: Procedure(s) (LRB): LAPAROSCOPIC LOW ANTERIOR RESECTION (N/A) LAPAROSCOPIC LIVER ULTRASOUND (N/A) OSTOMY (N/A) CYSTOSCOPY WITH STENT PLACEMENT (Left) APPENDECTOMY () EXPLORATORY LAPAROTOMY () URETEROLITHOTOMY (Right)  Patient Location: PACU  Anesthesia Type: General  Level of Consciousness: awake and alert   Airway and Oxygen Therapy: Patient Spontanous Breathing  Post-op Pain: mild  Post-op Assessment: Post-op Vital signs reviewed, Patient's Cardiovascular Status Stable, Respiratory Function Stable, Patent Airway and No signs of Nausea or vomiting  Post-op Vital Signs: stable  Complications: No apparent anesthesia complications

## 2012-02-06 NOTE — Op Note (Signed)
[name] [name] is a 61 y.o.   02/06/2012   Pre-Op Diagnosis: Right ureteral calculi, colon cancer.  Post-Op  Diagnosis: Same  Procedure done: Cystoscopy and insertion of left ureteral catheter.  Surgeon: Wendie Simmer. Mada Sadik  Anesthesia: General  Indication: Patient is a 61 years old male with colon cancer who is scheduled today for colon resection by Dr. Ezzard Standing. He also has right ureteral calculi. Dr. Ezzard Standing asked me to place ureteral catheters before colon resection. Patient already has a right double-J stent. I will insert a left ureteral catheter. When Dr. Ezzard Standing has completed the colon resection I wll return to the operating room for right ureterolithotomy.  Procedure: Patient was identified by his wrist band and proper timeout was taken.  Under general anesthesia he was prepped and draped and placed in the dorsolithotomy position. A panendoscope was inserted in the bladder. The anterior urethra is normal. He has moderate prostatic hypertrophy. The bladder mucosa is reddened. There is a double-J stent in the right kidney with the distal curl in the bladder. There is some edema at the trigone. The left ureteral orifice was identified and catheterized with a #6 [place] open ended catheter over a sensor wire. The cystoscope was then removed. A #16 French Foley catheter was inserted in the bladder. The ureteral and Foley catheter were then attached to a Goldberg attachment.  The patient was then left in the dorsolithotomy position for Dr. Allene Pyo procedure.

## 2012-02-06 NOTE — Op Note (Signed)
[name] [name] is a 61 y.o.   02/06/2012  Pre-Op Diagnosis: Rectal cancer,  Right hydronephrosis secondary to a mid ureteral calculus.  Post-Op Diagnosis: Same  Procedure done: Colon resection by Dr Ezzard Standing.  Exploration right ureter, ureterotomy, Removal of JJ stent, Ureteroscopy and insertion of JJ stent.  Surgeon: Wendie Simmer. Brenda Cowher  Assistant: Dr Ovidio Kin.  Indication: The patient is 61 years old male who had neoadjuvant chemoradiation for rectal cancer.  Work-up of the rectal cancer included a CT scan that revealed chronic right hydronephrosis secondary to a mid ureteral calculus.  Renal scan showed 20% function of the right kidney.  He underwent cystoscopy, retrograde pyelogram, ureteroscopy with attempted holmium laser of the right ureteral calculus on 11/28/11.  The ureteroscope could not be passed to the level of the stone because of stricture.  Therefore a JJ stent was left in the right renal unit.  I had to insert a # 4 Fr ; a # 6 Fr could not be advanced beyond the mid ureter.  A left ureteral catheter was placed this morning before Dr Allene Pyo procedure.  He is scheduled for exploration of the right ureter, ureterolithotomy, ureteroscopy and replacement of JJ stent.  Procedure:  The patient was identified by his wrist band and proper time out was done.  After Dr Ezzard Standing completed the colon resection, I returned to the operating room for the ureteral exploration.  The retroperitoneum was incised over the area of the anatomic location of the ureter in the pelvis . It was difficult to identify the ureter because of severe fibrosis of the pelvis secondary to radiation changes.    What we initially thought was the ureter was in fact the gonadal vein that was anterior to the ureter.  Careful dissection of the gonadal vein was done and the ureter was then identified posterior to the vein.  The pelvic ureter was very fibrotic and thickened.  I could not feel the stent in the ureter.  The ureter was  secured with a vessel loop and carefully dissected from the surrounding tissues.  At this point Dr Ezzard Standing mobilized the ascending colon and I was able to see what appears to be be healthy ureter proximal to the pelvic ureter.  With a #15 blade I made a ureterotomy in the proximal heathy appearing ureter.  I was not able to feel a stone in the proximal ureter nor in the distal ureter.  I then attempted to pass a flexible ureteroscope in the proximal ureter, but the scope could not be advanced proximally.  I then passed the ureteroscope towards the distal ureter.and was able to pass the ureteroscope over a short distance, approximately 3 cm.  There was severe fibrosis distally.  I passed a glide wire through the ureteroscope and in the bladder. I removed the ureteroscope.  The inner sheath of a ureteroscope access sheath was passed over the glide wire in an attempt to dilate ureter but it would not go through the distal ureter.  I removed the access sheath and  was successful in passing a # 6 Fr JJ stent distally in the bladder over the glide wire.  The glide wire was then passed through the proximal end of the JJ stent and he JJ stent was advanced over the glide wire into the renal pelvis.  The ureterotomy was then closed with interrupted sutures of #0000 Vicryl.  A Blake drain was placed adjacent to the ureterotomy and brought out through a stab wound.  The wound was  then copiously irrigated with normal saline.  The rest of the procedure was carried out by Dr Ezzard Standing.  EBL: 300 ml  Needles, sponges count: Correct on two occasions.

## 2012-02-06 NOTE — Anesthesia Preprocedure Evaluation (Signed)
Anesthesia Evaluation  Patient identified by MRN, date of birth, ID band Patient awake    Reviewed: Allergy & Precautions, H&P , NPO status , Patient's Chart, lab work & pertinent test results  Airway Mallampati: II TM Distance: <3 FB Neck ROM: Full    Dental No notable dental hx.    Pulmonary sleep apnea , COPDCurrent Smoker,  70 pk yr hx clear to auscultation  Pulmonary exam normal       Cardiovascular hypertension, Pt. on medications Regular Normal    Neuro/Psych Negative Neurological ROS  Negative Psych ROS   GI/Hepatic Neg liver ROS, GERD-  Medicated,  Endo/Other  Negative Endocrine ROS  Renal/GU negative Renal ROS  Genitourinary negative   Musculoskeletal negative musculoskeletal ROS (+)   Abdominal   Peds negative pediatric ROS (+)  Hematology negative hematology ROS (+)   Anesthesia Other Findings   Reproductive/Obstetrics negative OB ROS                           Anesthesia Physical Anesthesia Plan  ASA: III  Anesthesia Plan: General   Post-op Pain Management:    Induction: Intravenous  Airway Management Planned: Oral ETT  Additional Equipment:   Intra-op Plan:   Post-operative Plan: Extubation in OR  Informed Consent: I have reviewed the patients History and Physical, chart, labs and discussed the procedure including the risks, benefits and alternatives for the proposed anesthesia with the patient or authorized representative who has indicated his/her understanding and acceptance.   Dental advisory given  Plan Discussed with: CRNA  Anesthesia Plan Comments:         Anesthesia Quick Evaluation

## 2012-02-06 NOTE — H&P (Signed)
ASSESSMENT AND PLAN:  1. Rectal Cancer.   He has completed neoadjuvant therapy by Drs. [name] [name] and [name] [name] end of Nov/beginning of Dec 2012.   His CEA has returned to normal.   An attempt to biopsy the liver lesion showed benign liver tissue (though there is a question whether the lesion was biopsied).   So it appears to be time to go ahead with a resection of his rectal tumor. I discussed the planned surgery with the patient and his wife. Risks include bleeding, infection, leak, and recurrent cancer. [name] [name] will probably address the right kidney stone at the same time. He will most likely need a ostomy (either protective ileostomy or colostomy).   On sigmoidoscopy him preop - Polypoid tumor identified at about 11 - 12 cm from anal verge  2. Hepatic nodules. ? Mets?   Needle aspiration of right lobe of liver by [name] [name] - 12/22/2011 - Benign.  3. Right hydroureter secondary to nephrolithiasis.   Sees Dr. Irving [name].   I spoke to [name] [name]. He will try to address the right ureteral stone at the time of surgery. I will also want bilateral ureteral stents.  4. Hypertension.  5. Smokes. We talked about trying to quit before surgery. Even cutting back would be good.   Chief Complaint   Patient presents with   .  Follow-up     Rectal cancer    REFERRING PHYSICIAN: Sheryn [name], M.D.   HISTORY OF PRESENT ILLNESS:  [name] [name] is a 61 y.o. (DOB: 10/04/1951) AA male whose primary care physician is [name] Mew, MD, MD and comes to me today for follow up of a rectal cancer.  The patient has had some rectal bleeding for about one year. He saw Dr. Jarold [name] who did a colonoscopy on him on 09/03/2011. Dr. Jarold [name] saw in [name] [name]'s rectum, there was a 5 cm villous/necrotic tumor. Biopsy of the rectal tumor read by [name] [name] showed invasive adenocarcinoma (Case No.: [id number].)  The patient also had a CT scan the same day. The CT scan showed: 1. Large 4 cm rectal mass. 2. Left  perirectal mass is concerning for local extension of the tumor. 3. Multiple hepatic hypodensities are concerning for hepatic metastasis. 4. Chronic right hydronephrosis secondary to a obstructing calculus in the mid right ureter.  He has completed neoadjuvant therapy by Drs. [name] [name] and [place] end of Nov/beginning of Dec 2012. His CEA has returned to normal. An attempt to biopsy the liver lesion showed benign liver tissue (though there is a question whether the lesion was biopsied).   His wife is with him and I discussed about going ahead with surgery for the colon cancer.   Past Medical History   Diagnosis  Date   .  Hypertension    .  Abnormal EKG  11/01/11     REPORT IN EPIC-PT STATES NO KNOWN HEART PROBLEMS-NO CHEST PAINS   .  Chronic bronchitis    .  Rectal pain    .  Nasal congestion    .  Blood in stool    .  Dyspnea      intermittent with bronchitis   .  Heart murmur      SINCE BIRTH--DOES NOT CAUSE ANY PROBLEMS   .  COPD (chronic obstructive pulmonary disease)    .  GERD (gastroesophageal reflux disease)    .  Arthritis      IN BOTH SHOULDERS   .  Low blood potassium  POTASSIUM WAS 2.1 ON 11/01/11 VISIT TO ER--PT PUT ON ORAL POTASSIUM--MOST RECENT LAB 11/24/11 POTASSIUM IMPROVED TO 3.1   .  Rectal cancer      HAS COMPLETED RADIATION AND XELODA--PLAN RESTAGING TESTING. DR[facility] Truett [name] [name]   .  Hydronephrosis of right kidney      "Chronic" -SECONDARY TO CALCULUS MID RT URETER AND ADDITIONAL CALCULI WITHIN RT KIDNEY--PT STATES HE HAS PASSED A STONE IN THE PAST--DID NOT KNOW HE HAD MORE STONES UNTIL CT ABD DONE ON 09/03/11   .  Cancer      rectal adenocarcinoma stage [id number]   .  Rectal bleeding      hx 1 year thought was hemrrhoids   .  History of radiation therapy  09/29/11-11/07/11     rectal ca    Past Surgical History   Procedure  Date   .  Wrist and hand surgery      LEFT WRIST AND RIGHT HAND--GANGLION LW AND FX OF RT HAS   .   Right hand surgery -repair fracture    .  Removal ganglion cyst     Current Outpatient Prescriptions   Medication  Sig  Dispense  Refill   .  pantoprazole (PROTONIX) 40 MG tablet  as needed.     Marland Kitchen  amLODipine (NORVASC) 5 MG tablet  Take 5 mg by mouth daily.     .  bisoprolol-hydrochlorothiazide (ZIAC) 5-6.25 MG per tablet  Take 1 tablet by mouth daily at 12 noon.     Marland Kitchen  oxyCODONE-acetaminophen (PERCOCET) 5-325 MG per tablet  Take 1-2 tablets by mouth every 6 (six) hours as needed.  50 tablet  0   .  phenazopyridine (PYRIDIUM) 200 MG tablet       No Known Allergies   REVIEW OF SYSTEMS:  Skin: No history of rash. No history of abnormal moles.  Infection: No history of hepatitis or HIV. No history of MRSA.  Neurologic: No history of stroke. No history of seizure. No history of headaches.  Cardiac: Hypertension x 10 years. No history of heart disease. No history of prior cardiac catheterization. No history of seeing a cardiologist.  Pulmonary: Smokes cigarettes - 1-1 1/2 PPD. No OSA/CPAP.  Endocrine: No diabetes. No thyroid disease.  Gastrointestinal: See HPI.  Urologic: History of kidney stones. Has partial obstruction of right ureter.Sees Dr. Irving [name]. No history of bladder infections.  Musculoskeletal: Bilateral shoulder pain. Not seeing anyone for this.  Hematologic: No bleeding disorder. No history of anemia. Not anticoagulated.   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  No children.  No family history of colon cancer.   PHYSICAL EXAM:   General: WN BM.  HEENT: Normal. Pupils equal. Normal dentition.  Neck: Supple. No thyroid mass.  Lymph Nodes: No supraclavicular or cervical nodes.  Lungs: Clear and symmetric.  Heart: RRR. No murmur.   Abdomen: No mass. No tenderness. No hernia. Normal bowel sounds. No abdominal scars.  Rectal: Pain better now that he is over chemo/radation.  Extremities: Good strength in upper and lower extremities.  Neurologic:  Grossly intact to motor and sensory function.   DATA REVIEWED:  Path report - benign - 12/22/2011 - from [name] [name] liver biopsy.  CEA - 12/01/2011 - 1.6 (was 13.8 from October 2012)  Creat - 1.59   Ovidio Kin, MD, [facility] Pager: [phone] Office phone:  [phone]

## 2012-02-06 NOTE — Preoperative (Signed)
Beta Blockers   Reason not to administer Beta Blockers:Not Applicable 

## 2012-02-07 LAB — BASIC METABOLIC PANEL
BUN: 17 mg/dL (ref 6–23)
CO2: 25 mEq/L (ref 19–32)
GFR calc non Af Amer: 57 mL/min — ABNORMAL LOW (ref >90)
Glucose, Bld: 251 mg/dL — ABNORMAL HIGH (ref 70–99)
Potassium: 4.1 mEq/L (ref 3.5–5.1)
Sodium: 133 mEq/L — ABNORMAL LOW (ref 135–145)

## 2012-02-07 LAB — CBC
Hemoglobin: 12.5 g/dL — ABNORMAL LOW (ref 13.0–17.0)
MCH: 34.7 pg — ABNORMAL HIGH (ref 26.0–34.0)
MCHC: 36.1 g/dL — ABNORMAL HIGH (ref 30.0–36.0)
MCV: 96.1 fL (ref 78.0–100.0)
RBC: 3.6 MIL/uL — ABNORMAL LOW (ref 4.22–5.81)

## 2012-02-07 MED ORDER — INFLUENZA VIRUS VACC SPLIT PF IM SUSP
0.5000 mL | INTRAMUSCULAR | Status: AC
  Administered 2012-02-08: 0.5 mL via INTRAMUSCULAR
  Filled 2012-02-07: qty 0.5

## 2012-02-07 MED ORDER — KCL IN DEXTROSE-NACL 20-5-0.9 MEQ/L-%-% IV SOLN
INTRAVENOUS | Status: DC
  Administered 2012-02-07 (×2): via INTRAVENOUS
  Administered 2012-02-08: 100 mL via INTRAVENOUS
  Administered 2012-02-08: 125 mL via INTRAVENOUS
  Administered 2012-02-08 – 2012-02-10 (×2): via INTRAVENOUS
  Filled 2012-02-07 (×11): qty 1000

## 2012-02-07 NOTE — Progress Notes (Signed)
1 Day Post-Op  Subjective: Not having a lot of abdominal pain.  Ng tube is bothering him.  Objective: Vital signs in last 24 hours: Temp:  [96.8 F (36 C)-98 F (36.7 C)] 97.8 F (36.6 C) (03/02 0800) Pulse Rate:  [73-86] 85  (03/02 0800) Resp:  [10-20] 16  (03/02 0800) BP: (132-177)/(59-86) 142/74 mmHg (03/02 0800) SpO2:  [94 %-100 %] 99 % (03/02 0800)    Intake/Output from previous day: 03/01 0701 - 03/02 0700 In: 16109 [I.V.:9725; IV Piggyback:600] Out: 4900 [Urine:4590; Drains:10; Blood:300]- some hematuria Intake/Output this shift: Total I/O In: 150 [I.V.:150] Out: 400 [Urine:400]  PE: CV-RRR LUNGS- clear ABD-soft, quiet, ileostomy pink with thin, bloody output, serosanguinous drain output, dressing dry  Lab Results:   Basename 02/07/12 0315 02/06/12 1610  WBC 13.0* 11.4*  HGB 12.5* 13.3  HCT 34.6* 38.4*  PLT 209 241   BMET  Basename 02/07/12 0315 02/06/12 1500  NA 133* 135  K 4.1 3.6  CL 102 103  CO2 25 25  GLUCOSE 251* 198*  BUN 17 19  CREATININE 1.29 1.32  CALCIUM 8.5 8.2*   PT/INR No results found for this basename: LABPROT:2,INR:2 in the last 72 hours Comprehensive Metabolic Panel:    Component Value Date/Time   NA 133* 02/07/2012 0315   K 4.1 02/07/2012 0315   CL 102 02/07/2012 0315   CO2 25 02/07/2012 0315   BUN 17 02/07/2012 0315   CREATININE 1.29 02/07/2012 0315   GLUCOSE 251* 02/07/2012 0315   CALCIUM 8.5 02/07/2012 0315   AST 14 02/04/2012 1100   ALT 11 02/04/2012 1100   ALKPHOS 121* 02/04/2012 1100   BILITOT 0.3 02/04/2012 1100   PROT 6.8 02/04/2012 1100   ALBUMIN 3.4* 02/04/2012 1100     Studies/Results: No results found.  Anti-infectives: Anti-infectives     Start     Dose/Rate Route Frequency Ordered Stop   02/06/12 1800   cefOXitin (MEFOXIN) 1 g in dextrose 5 % 50 mL IVPB        1 g 100 mL/hr over 30 Minutes Intravenous Every 6 hours 02/06/12 1605 02/07/12 0706   02/06/12 0534   cefOXitin (MEFOXIN) 1 g in dextrose 5 % 50 mL IVPB          1 g 100 mL/hr over 30 Minutes Intravenous 60 min pre-op 02/06/12 0534 02/06/12 1222          Assessment Active Problems:  HYPERTENSION  Rectal cancer, approx. 11 cm from anal verge, s/p LAR and loop ileostomy 02/06/12-stable overnight  Hydroureter, right, apparently secondary to nephrolithiasis  Hyperglycemia and hyponatremia   LOS: 1 day   Plan: D/C ng tube.  OOB. Change IVF.   Taray Normoyle J 02/07/2012

## 2012-02-07 NOTE — Op Note (Signed)
NAMEVASCO, [name]              ACCOUNT NO.:  [id number]  MEDICAL RECORD NO.:  [id number]  LOCATION:  1226                         FACILITY:  [facility]  PHYSICIAN:  Sandria Bales. Ezzard Standing, M.D.  DATE OF BIRTH:  May 24, 1951  DATE OF PROCEDURE:  02/06/2012                               OPERATIVE REPORT   PREOPERATIVE DIAGNOSIS:  Rectal cancer (approximately 10 cm from anal verge), probable metastases to right and left lobes of liver, right ureterolithiasis.  POSTOPERATIVE DIAGNOSIS:  Rectal cancer (approximately 10 cm from anal verge), probable metastases to mid left lobe and mid right lobe of liver(by palpation), scarred right ureter.  PROCEDURE:  Laparoscopic exploration with mobilization of the splenic flexure, laparoscopic liver ultrasound, low anterior resection of distal sigmoid colon and rectum, appendectomy, and protective loop ileostomy by Dr. Ovidio Kin with first assistants Dr. Avel Peace and Dr. Magnus Ivan.   Second part of operation is right ureteral exploration by Dr. Su Grand with Dr. Ovidio Kin assisting.  ANESTHESIA:  General endotracheal.  ESTIMATED BLOOD LOSS:  300 mL.  DRAINS:  Drains left in was a 19-Blake drain near the right ureter.  Local:  20 mL of Exparel.  COMPLICATIONS:  None.  INDICATION FOR PROCEDURE:  [name] [name] is a 61 year old African American male, who was found to have a rectal cancer on colonoscopy by Dr. Jarold Motto in September 2012.  He also was found to have a chronic right hydronephrosis secondary to what appeared to be an obstructing calculus and he had multiple hepatic hypodensities.  He underwent neoadjuvant chemotherapy and radiation therapy supervised by Dr. Garnet Koyanagi and Dr. Margaretmary Dys, which was completed by the beginning of December.  His CA returned to normal.    We then discussed about proceeding with resection of this primary tumor, though he still has suspicious areas in his liver.  He underwent a biopsy by IR of  one of these liver lesions, but the biopsy was benign. It was felt that possibly the biopsy did not sample the tumor.  I discussed with the patient about proceeding with resection of his primary tumor.  I will try to evaluate his liver to see if we could do a biopsy of this also.  The indication of risks of surgery were explained to the patient.  The risks include, but not limited to, bleeding, infection, leak from the bowel or recurrent cancer, also told him that I probably be doing an ostomy to protect his anastomosis.  At the same time, Dr. Su Grand did not try to address the right ureter which appears to have chronic ureterolithiasis.  OPERATIVE NOTE:  The patient underwent a general anesthesia, supervised by Dr. Eilene Ghazi in room #11 at [facility].  He already had a right ureteral stent for a chronically obstructed right ureter.  Dr. Su Grand put a left ureteral stent for me at the beginning of the operation.  A time-out and surgical checklist was run for my portion of the operation.  He was in lithotomy position.  His perineum was prepped with Betadine.  His abdomen was prepped with ChloraPrep, sterilely draped.  I went through a left upper quadrant Optiview 5 mm to get into the  abdomen. I insufflated his abdomen.  I then placed 4 additional trocars; a right lower quadrant, a 10-mm midline, a 5 mm left lower quadrant, and then a 5-mm for the supraumbilical port. I first looked at both the right and left lobes of the liver at least on the surface, I could see no obvious tumor.  He did have a hemangioma on the left lobe of his liver.  A photo was taken.  This was then placed in the chart.  I then mobilized the left colon and splenic flexure to the mid transverse colon.  He got this down through the splenic flexure down to the upper level of the umbilicus.  At this point, I looked in his pelvis, he has some scarring of the sigmoid colon along this left pelvic brim.  I  took this down laparoscopically.    I went on and did an open incision to mobilize the colon further.  Prior to opening, I did carry out an ultrasound of both the right and left lobes of the liver, I could see the hemangioma of the left lobe of the liver, but I could not see a specific mass within the liver.  Then, I opened the lower abdomen.  I was able to go up and palpate the NG tube in right position.  I palpated his liver.  He does have about a 1.5 cm nodule in the mid left lobe of the liver and about a 1.5 cm nodule lateral in the right lobe of the liver and this seemed to correlate with at least 2 of the nodules seen on MRI, and I tried to position to try to get a biopsy of the area, but they were in the body of the liver.  There was no really easy way to do this.  So, I did do an extensive incision, which I thought would add nothing to his immediate course.  I then turned my attention to his pelvis, identified the left ureter and I identified the right ureter.  I divided the sigmoid colon at mid sigmoid colon, took the mesentery down to the pelvic promontory.  I then tried to do a mesorectal resection to below the tumor.  The tumor was palpated immediately below the peritoneal reflection.  I was able to mobilize this up.  I was able to divide pedicles on both sides of the rectum.  I used the Contour stapler, the blue load, to divide the rectum.  I put a long suture in the proximal end and I opened the specimen at the bedside.  It looked like he had an ample margin, about 5 cm from the tumor to the distal cut to colon.  At this point, I worked on preparing the proximal sigmoid colon.  I had to cut back to fresh bowel.  I did a whip over stitch of 2-0 Prolene suture.  I measured the ostomy first with a 25, then a 29.  The lumen barely admitted the 29 sizer.  So I used a 29 Ethicon EEA.  I placed the anvil in this and tied the Prolene over this.  Dr. Magnus Ivan went down to his rectum.   He passed the EEA from Ethicon up the rectum.  I was able to visualize this going immediately posterior to the staple line.  I clipped on sigmoid colon with the anvil.  He closed this and then fired the EEA.  We got both good proximal and distal rings, both rings were sent to Pathology and  were labeled separately.  He then sigmoidoscoped the patient, it looked like our anastomosis, he thought, was about 8 cm from the anal verge.  He was insufflated with air.  I clamped off the sigmoid colon.  I flooded the upper abdomen with saline.  There was no bubbling or evidence of an air leak.  So, it looked like I had a good anastomosis, no air leak to the bowel looked viable.  The bowel that looked like a change or has some changes with radiation therapy, I had resected.      I did an appendectomy.  The cecum was tethered along the pelvic brim.    At this point, Dr. Su Grand came in, we identified the right ureter.  At first, we thought the ureter was the right gonadal vein, but this was actually retracted anteriorly, and behind that we found a chronically scarred ureter that was involved in radiation within the pelvis and probably because of this chronic obstruction proximally was thickened.  Dr. Brunilda Payor will dictate the ureteral exploration and replacement of the right ureteral tubes.  After completion of repair of the right ureter, we placed a 19-French drain adjacent to the ureter.  This was brought through a stab wound in the right lower quadrant of the abdomen.  Because of his radiation and the size of the tumor, I thought it will be best with a protective ileostomy, so I did an incision in his right lower quadrant and brought up a loop of small bowel at 15-20 cm proximal to the terminal ileum and used a 22 red rubber catheter and this was closed with a 2-0 nylon suture.  I then closed the abdomen first with a running 0 Vicryl suture to close the peritoneum and then with two running #1 PDS  sutures of interrupted #1 Novafil sutures, I closed the fascia.  We placed Telfa wicks into the skin after the staples and stapled all the wounds.  I then matured the ileostomy with interrupted 3-0 Vicryl sutures and placed a colostomy bag on this.  I irrigated the abdomen with 2 L of saline prior to closing.  The patient has done well.  He has a loop ileostomy, a dilated rectum at the end of the procedure.  The left ureteral stent was removed.  The right ureteral stent was a double-J that Dr. Brunilda Payor left in.  He has an NG tube in place.  Foley catheter in place.  Sponge and needle count were correct at the end of the case.  He was transferred to the RR in good condition.   Sandria Bales. Ezzard Standing, M.D.     DHN/MEDQ  D:  02/06/2012  T:  02/07/2012  Job:  [id number]  cc:   Stacie Glaze, MD [address] [place] [address]  Vania Rea. Jarold Motto, MD, FACG, FACP, FAGA [address] [place] [address]  Oneita Hurt, M.D. Fax: [phone]  Lindaann Slough, M.D. Fax: [phone]  Garnet Koyanagi, MD Fax: [phone]

## 2012-02-07 NOTE — Progress Notes (Signed)
GU  No acute GU events.  Pain controlled. Foley in place.  Urine output: 4590 cc; JP 10 cc  Filed Vitals:   02/07/12 0800  BP: 142/74  Pulse: 85  Temp: 97.8 F (36.6 C)  Resp: 16  Gen: NAD CV: RRR Chest: CTA-B Abd: soft, appropriately TTP, JP serosanguinous GU: foley draining amber colored urine.  A/P: POD#1 left ureter stent placement, right open ureterotomy with stent placement -Continue foley catheter -JP output remains low; will continue drainage. -Will continue to follow

## 2012-02-08 LAB — BASIC METABOLIC PANEL
BUN: 16 mg/dL (ref 6–23)
CO2: 27 mEq/L (ref 19–32)
Calcium: 8.6 mg/dL (ref 8.4–10.5)
Creatinine, Ser: 1.19 mg/dL (ref 0.50–1.35)
GFR calc non Af Amer: 63 mL/min — ABNORMAL LOW (ref >90)
Glucose, Bld: 146 mg/dL — ABNORMAL HIGH (ref 70–99)
Sodium: 139 mEq/L (ref 135–145)

## 2012-02-08 LAB — CBC
MCH: 33.6 pg (ref 26.0–34.0)
MCHC: 34.4 g/dL (ref 30.0–36.0)
MCV: 97.7 fL (ref 78.0–100.0)
Platelets: 206 10*3/uL (ref 150–400)
RDW: 12.1 % (ref 11.5–15.5)

## 2012-02-08 MED ORDER — AMLODIPINE BESYLATE 5 MG PO TABS
5.0000 mg | ORAL_TABLET | Freq: Every day | ORAL | Status: DC
  Administered 2012-02-08 – 2012-02-12 (×5): 5 mg via ORAL
  Filled 2012-02-08 (×5): qty 1

## 2012-02-08 MED ORDER — LABETALOL HCL 5 MG/ML IV SOLN
10.0000 mg | INTRAVENOUS | Status: DC | PRN
  Administered 2012-02-08 – 2012-02-10 (×4): 10 mg via INTRAVENOUS
  Filled 2012-02-08 (×4): qty 4

## 2012-02-08 NOTE — Progress Notes (Signed)
2 Days Post-Op  Subjective: Adequate pain control.  No nausea.  Got OOB yesterday.  Objective: Vital signs in last 24 hours: Temp:  [97.8 F (36.6 C)-98.5 F (36.9 C)] 98.5 F (36.9 C) (03/03 0400) Pulse Rate:  [82-97] 93  (03/03 0615) Resp:  [16-21] 18  (03/03 0615) BP: (142-178)/(73-96) 178/96 mmHg (03/03 0615) SpO2:  [95 %-99 %] 98 % (03/03 0615) Weight:  [172 lb 6.4 oz (78.2 kg)-180 lb 12.4 oz (82 kg)] 172 lb 6.4 oz (78.2 kg) (03/03 0615) Last BM Date: 02/06/12  Intake/Output from previous day: 03/02 0701 - 03/03 0700 In: 2674.8 [I.V.:2674.8] Out: 6360 [Urine:6300; Drains:10; Stool:50] Intake/Output this shift:    PE: Abd-soft, hypoactive BS, loop ileostomy pink with scant output, dressings dry  Lab Results:   [facility] 02/08/12 0336 02/07/12 0315  WBC 12.8* 13.0*  HGB 11.5* 12.5*  HCT 33.4* 34.6*  PLT 206 209   BMET  Basename 02/08/12 0336 02/07/12 0315  NA 139 133*  K 4.0 4.1  CL 108 102  CO2 27 25  GLUCOSE 146* 251*  BUN 16 17  CREATININE 1.19 1.29  CALCIUM 8.6 8.5   PT/INR No results found for this basename: LABPROT:2,INR:2 in the last 72 hours Comprehensive Metabolic Panel:    Component Value Date/Time   NA 139 02/08/2012 0336   K 4.0 02/08/2012 0336   CL 108 02/08/2012 0336   CO2 27 02/08/2012 0336   BUN 16 02/08/2012 0336   CREATININE 1.19 02/08/2012 0336   GLUCOSE 146* 02/08/2012 0336   CALCIUM 8.6 02/08/2012 0336   AST 14 02/04/2012 1100   ALT 11 02/04/2012 1100   ALKPHOS 121* 02/04/2012 1100   BILITOT 0.3 02/04/2012 1100   PROT 6.8 02/04/2012 1100   ALBUMIN 3.4* 02/04/2012 1100     Studies/Results: No results found.  Anti-infectives: Anti-infectives     Start     Dose/Rate Route Frequency Ordered Stop   02/06/12 1800   cefOXitin (MEFOXIN) 1 g in dextrose 5 % 50 mL IVPB        1 g 100 mL/hr over 30 Minutes Intravenous Every 6 hours 02/06/12 1605 02/07/12 0706   02/06/12 0534   cefOXitin (MEFOXIN) 1 g in dextrose 5 % 50 mL IVPB        1 g 100  mL/hr over 30 Minutes Intravenous 60 min pre-op 02/06/12 0534 02/06/12 1222          Assessment Active Problems:  HYPERTENSION-BP elevated, Norvasc restarted.  Rectal cancer, approx. 11 cm from anal verge, s/p LAR and loop ileostomy 02/06/12  Hydroureter, right, apparently secondary to nephrolithiasis    LOS: 2 days   Plan: Transfer to floor.  Clear liquid diet.  Ambulate.  Prn Labetolol for HTN   Keefer Soulliere J 02/08/2012

## 2012-02-08 NOTE — Progress Notes (Signed)
eLink Physician-Brief Progress Note Patient Name: [name] [name] DOB: 23-Aug-1951 MRN: [id number]  Date of Service  02/08/2012   HPI/Events of Note   HTN  eICU Interventions   Restart home norvasc with dose now      [name] [name] 02/08/2012, 12:14 AM

## 2012-02-08 NOTE — Progress Notes (Signed)
GU  No acute GU events.  Patient feeling well without complaint.   Urine output: 4800 cc; JP 10 cc (10 cc)  Filed Vitals:   02/08/12 0615  BP: 178/96  Pulse: 93  Temp:   Resp: 18  Gen: NAD CV: RRR Chest: CTA-B Abd: soft, appropriately TTP, JP serosanguinous GU: foley draining amber colored urine.  A/P: POD#2 left ureter stent placement, right open ureterotomy with stent placement -Continue foley catheter while PCA in place; OK to remove foley after he is off PCA or when he is more ambulatory. -JP output remains low; will continue drainage.  Will need to remove foley before removing JP. -Will continue to follow

## 2012-02-08 NOTE — Progress Notes (Signed)
Pt had progressive increase in bp thru the night, Dr Lequita Asal (E-Link) notified of pt's bp. Order for 5mg  Norvasc received. Pt bp continued to be in the 170s. Dr Lequita Asal made aware of this on each occasion, no new order received. Pt alert,oriented x 4 and  denied pain. Will continue to monitor.

## 2012-02-09 MED ORDER — HYDROCODONE-ACETAMINOPHEN 5-325 MG PO TABS: 1.0000 | ORAL_TABLET | ORAL | Status: DC | PRN

## 2012-02-09 NOTE — Progress Notes (Signed)
GU  JP Cr returned at 2, which is slightly elevated.  Will continue without foley for now and keep an eye on JP volume.  Nurse reports it has not had to be emptied since it was sent for lab.    Will defer to Dr. Brunilda Payor to evaluate tomorrow and decide whether catheter should be replaced.

## 2012-02-09 NOTE — Progress Notes (Signed)
Dsg  Changed  to JP drain, insertion clean and dry, sutures intact. Split gauze dressing reapplied. Colostomy  Bag changed, thick green liquidy drainage was emptied. Appetite fair. Pt ambulated in the hall, the whole length of the unit x2 . States feeling stronger today. No c/o of pain.

## 2012-02-09 NOTE — Progress Notes (Signed)
GU  Patient had an increase in JP volume (from 10cc up to 50cc) corresponding with the removal of his foley catheter.  Urine output: 3950 cc; JP 50 cc (10 cc)  Filed Vitals:   02/09/12 0525  BP: 179/103  Pulse: 96  Temp: 98.9 F (37.2 C)  Resp: 18  Gen: NAD CV: RRR Chest: CTA-B Abd: soft, appropriately TTP, JP serosanguinous GU: foley draining amber colored urine.  A/P: POD#3left ureter stent placement, right open ureterotomy with stent placement -Will send JP fluid for creatinine to confirm or rule out urine leak. If positive for urine will need to replace foley catheter. -Continue ambulation and current care per General Surgery.

## 2012-02-09 NOTE — Plan of Care (Signed)
Problem: Phase II Progression Outcomes Goal: Progress activity as tolerated unless otherwise ordered Outcome: Progressing Pt. Ambulated the whole length of the unit x2, tolerated. States feeling stronger today. Goal: Progressing with IS, TCDB Outcome: Progressing Using spirometer- upto 1300 Goal: Vital signs stable Outcome: Progressing Bp running high Goal: Sutures/staples intact Outcome: Progressing Sutures in JP drain

## 2012-02-09 NOTE — Progress Notes (Signed)
General Surgery Note  LOS: 3 days  POD# 3  Assessment/Plan:  1. LAPAROSCOPIC LOW ANTERIOR RESECTION, protective loop ileostomy, appendectomy - for rectal cancer - 02/06/2012 - D. [employer] working. On clear liquids.  2.  Right ureteral exploration - 02/06/2012 - M. Nesi Hydroureter, right, secondary to nephrolithiasis  Creatinine ordered from JP drain - pending. 3.  HYPERTENSION     Subjective:  Doing okay.  On clear liquids, though po intake limited. Objective:   Filed Vitals:   02/09/12 0525  BP: 179/103  Pulse: 96  Temp: 98.9 F (37.2 C)  Resp: 18     Intake/Output from previous day:  03/03 0701 - 03/04 0700 In: 2570 [P.O.:240; I.V.:2325] Out: 4010 [Urine:3950; Drains:50; Stool:10]  Intake/Output this shift:      Physical Exam:   General: WN AA M who is alert and oriented.    HEENT: Normal. Pupils equal. Good dentition. .   Lungs: Clear.   Abdomen: Soft, but quiet.  Will leave on clear liquids for now.   Wound: okay, ostomy okay with some output.  Wicks removed.   Neurologic:  Grossly intact to motor and sensory function.   Psychiatric: Has normal mood and affect. Behavior is normal   Lab Results:    Basename 02/08/12 0336 02/07/12 0315  WBC 12.8* 13.0*  HGB 11.5* 12.5*  HCT 33.4* 34.6*  PLT 206 209    BMET   Basename 02/08/12 0336 02/07/12 0315  NA 139 133*  K 4.0 4.1  CL 108 102  CO2 27 25  GLUCOSE 146* 251*  BUN 16 17  CREATININE 1.19 1.29  CALCIUM 8.6 8.5    PT/INR  No results found for this basename: LABPROT:2,INR:2 in the last 72 hours  ABG  No results found for this basename: PHART:2,PCO2:2,PO2:2,HCO3:2 in the last 72 hours   Studies/Results:  No results found.   Anti-infectives:   Anti-infectives     Start     Dose/Rate Route Frequency Ordered Stop   02/06/12 1800   cefOXitin (MEFOXIN) 1 g in dextrose 5 % 50 mL IVPB        1 g 100 mL/hr over 30 Minutes Intravenous Every 6 hours 02/06/12 1605 02/07/12 0706   02/06/12 0534    cefOXitin (MEFOXIN) 1 g in dextrose 5 % 50 mL IVPB        1 g 100 mL/hr over 30 Minutes Intravenous 60 min pre-op 02/06/12 0534 02/06/12 1222          Ovidio Kin, MD, FACS Pager: [phone],   [facility]: [phone] 02/09/2012

## 2012-02-10 NOTE — Progress Notes (Signed)
T;  99.2        BP:  148/92      P:  90       R:  18 JP drainage increased after Foley removal, probably secondary to reflux via the stent. Voids well.  Urine grossly clear.   Reinsert Foley

## 2012-02-10 NOTE — Consult Note (Signed)
WOC ostomy consult  Stoma type/location: RLQ loop ileostomy Stomal assessment/size: 1 and 1/2 inches round, budded and pink.  Functional os located at 12 o'clock, slightly above skin level. Red robinson catheter used as stomal bridge, sutured at 3 and 9 o'clock. Peristomal assessment: intact, clear. Treatment options for stomal/peristomal skin: None noted at this time. Output: Think dark brown liquid effluent. Ostomy pouching: 1pc. Pouch with built in convexity.  [facility] [phone].  Patient wearing belt for added security. Education provided: teaching session to share information regarding WOC nurse as a post operative resource and specialist.  Education booklet provided.  Pouch characteristics, stoma characteristics (including edema) taught.  Dietary interventions to thicken stool mentioned (BRAT diet).  Patient taught Sherryl Barters' Roll closure, able to provide return demonstration.  Asking appropriate questions.  Patient established with [facility] Start post acute supply sampling program. Our team will follow with you and see tomorrow.  Please consider [facility] referral for a couple of weeks post-discharge as stoma size changes and patient adapts to ostomy.  If you agree, please order. Thanks, Ladona Mow, MSN, RN, [facility], CWOCN [phone])

## 2012-02-10 NOTE — Progress Notes (Signed)
General Surgery Note  LOS: 4 days  POD# 4 Rm - 1317.  Assessment/Plan:  1. LAPAROSCOPIC LOW ANTERIOR RESECTION, protective loop ileostomy, appendectomy - for rectal cancer - 02/06/2012 - D. [employer] working.  Nurses having some trouble fitting bag.  Ostomy nurse consulted. On clear liquids.  Vomited once last night. To increase diet to full liquids. Pathology - T3 tumor, 4/10 nodes positive - ypT3, ypN2, margins clear  2.  Right ureteral exploration - 02/06/2012 - M. Nesi Hydroureter, right, secondary to nephrolithiasis  Creatinine - 2.0 -from JP drain.  50 cc last 12 hours. 3.  HYPERTENSION. 4.  DVT proph - SQ heparin. 5.  Needs to ambulate.    Subjective:  Doing okay.  On clear liquids, though po intake limited. Objective:   Filed Vitals:   02/10/12 0600  BP: 147/93  Pulse: 86  Temp: 98.4 F (36.9 C)  Resp: 18     Intake/Output from previous day:  03/04 0701 - 03/05 0700 In: 2580 [P.O.:180; I.V.:2400] Out: 2160 [Urine:1350; Drains:110; Stool:700]  Intake/Output this shift:      Physical Exam:   General: WN AA M who is alert and oriented. Feels good, though he said he vomited once last pm.   HEENT: Normal. Pupils equal. Good dentition. .   Lungs: Clear.   Abdomen: Soft.  Has some bowel sounds.     Wound: Okay.  To leave open.  Ostomy bag needs to be worked on.   Neurologic:  Grossly intact to motor and sensory function.   Psychiatric: Has normal mood and affect. Behavior is normal   Lab Results:     Basename 02/08/12 0336  WBC 12.8*  HGB 11.5*  HCT 33.4*  PLT 206    BMET    Basename 02/08/12 0336  NA 139  K 4.0  CL 108  CO2 27  GLUCOSE 146*  BUN 16  CREATININE 1.19  CALCIUM 8.6    PT/INR  No results found for this basename: LABPROT:2,INR:2 in the last 72 hours  ABG  No results found for this basename: PHART:2,PCO2:2,PO2:2,HCO3:2 in the last 72 hours   Studies/Results:  No results found.   Anti-infectives:   Anti-infectives     Start      Dose/Rate Route Frequency Ordered Stop   02/06/12 1800   cefOXitin (MEFOXIN) 1 g in dextrose 5 % 50 mL IVPB        1 g 100 mL/hr over 30 Minutes Intravenous Every 6 hours 02/06/12 1605 02/07/12 0706   02/06/12 0534   cefOXitin (MEFOXIN) 1 g in dextrose 5 % 50 mL IVPB        1 g 100 mL/hr over 30 Minutes Intravenous 60 min pre-op 02/06/12 0534 02/06/12 1222          Ovidio Kin, MD, FACS Pager: [phone],   [facility]: [phone] 02/10/2012

## 2012-02-10 NOTE — Progress Notes (Signed)
Foley Placed per physician order, pt tolerated well.  Noted 550 cc of urine return post placement, initially urine bloody, but cleared to yellow/straw within a few minutes post placement.

## 2012-02-11 MED ORDER — KCL IN DEXTROSE-NACL 20-5-0.9 MEQ/L-%-% IV SOLN: INTRAVENOUS | Status: DC

## 2012-02-11 MED ORDER — SODIUM CHLORIDE 0.9 % IJ SOLN
3.0000 mL | Freq: Two times a day (BID) | INTRAMUSCULAR | Status: DC
  Administered 2012-02-11 – 2012-02-12 (×2): 3 mL via INTRAVENOUS

## 2012-02-11 MED ORDER — SODIUM CHLORIDE 0.9 % IJ SOLN: 3.0000 mL | INTRAMUSCULAR | Status: DC | PRN

## 2012-02-11 MED ORDER — SODIUM CHLORIDE 0.9 % IV SOLN: 250.0000 mL | INTRAVENOUS | Status: DC | PRN

## 2012-02-11 NOTE — Progress Notes (Signed)
CARE MANAGEMENT NOTE 02/11/2012  Patient:  [name] [name], [name] [name]   Account Number:  [id number]  Date Initiated:  02/11/2012  Documentation initiated by:  Zakira Ressel  Subjective/Objective Assessment:   61 yo male admitted 02/06/12 with rectal cancer     Action/Plan:   D/C when medically stable   Anticipated DC Date:  02/12/2012   Anticipated DC Plan:  HOME W HOME HEALTH SERVICES      DC Planning Services  CM consult      [facility] Choice  HOME HEALTH   Choice offered to / List presented to:  C-1 Patient        HH arranged  HH-1 RN      [facility]  [facility]   Status of service:  Completed, signed off  Discharge Disposition:  HOME W HOME HEALTH SERVICES  Comments:  02/11/12, Kathi Der RNC-MNN, BSN, [phone], CM received referral.  CM met with pt and offered choice for [facility] services.  Pt has chosen [facility].  Darl Pikes at [facility] contacted with order and confirmation of services received.  Pt states that he will be going to his mother's house at discharge. Address and phone numbers provided to Darl Pikes at [facility].

## 2012-02-11 NOTE — Progress Notes (Signed)
General Surgery Note  LOS: 5 days  POD# 5 Rm - 1317.  Assessment/Plan:  1. LAPAROSCOPIC LOW ANTERIOR RESECTION, protective loop ileostomy, appendectomy - for rectal cancer - 02/06/2012 - D. [employer] working. Jacki Cones McNichol saw yesterday.  Bag seems to be on better. To advance to regular diet. Pathology - T3 tumor, 4/10 nodes positive - ypT3, ypN2, margins clear. Home health care nurse consulted for d/c.  2.  Right ureteral exploration - 02/06/2012 - M. Nesi Hydroureter, right, secondary to nephrolithiasis  Foley reinserted.  I&O's hard to track in Epic ... But patient said there was a lot of urine when the foley was placed.  Disposition per Dr. Brunilda Payor. 3.  HYPERTENSION. 4.  DVT proph - SQ heparin. 5.  Needs to ambulate.    Subjective:  Doing okay. Tolerated full liquids, to advance diet. Objective:   Filed Vitals:   02/11/12 0523  BP: 156/79  Pulse: 73  Temp: 98.5 F (36.9 C)  Resp: 18     Intake/Output from previous day:  03/05 0701 - 03/06 0700 In: 1878.3 [P.O.:610; I.V.:1268.3] Out: 2070 [Urine:1500; Drains:20; Stool:550]  Intake/Output this shift:      Physical Exam:   General: WN AA M who is alert and oriented. Feels better.  Tolerating full liquids.   HEENT: Normal. Pupils equal. Good dentition. .   Lungs: Clear.   Abdomen: Soft.  Has bowel sounds.     Wound: Okay.  Wound looks good.  Ostomy functioning.   Neurologic:  Grossly intact to motor and sensory function.   Psychiatric: Has normal mood and affect. Behavior is normal   Lab Results:    No results found for this basename: WBC:2,HGB:2,HCT:2,PLT:2 in the last 72 hours  BMET   No results found for this basename: NA:2,K:2,CL:2,CO2:2,GLUCOSE:2,BUN:2,CREATININE:2,CALCIUM:2 in the last 72 hours  PT/INR  No results found for this basename: LABPROT:2,INR:2 in the last 72 hours  ABG  No results found for this basename: PHART:2,PCO2:2,PO2:2,HCO3:2 in the last 72 hours   Studies/Results:  No results  found.   Anti-infectives:   Anti-infectives     Start     Dose/Rate Route Frequency Ordered Stop   02/06/12 1800   cefOXitin (MEFOXIN) 1 g in dextrose 5 % 50 mL IVPB        1 g 100 mL/hr over 30 Minutes Intravenous Every 6 hours 02/06/12 1605 02/07/12 0706   02/06/12 0534   cefOXitin (MEFOXIN) 1 g in dextrose 5 % 50 mL IVPB        1 g 100 mL/hr over 30 Minutes Intravenous 60 min pre-op 02/06/12 0534 02/06/12 1222          Ovidio Kin, MD, FACS Pager: [phone],   [facility] Office: [phone] 02/11/2012

## 2012-02-11 NOTE — Progress Notes (Signed)
HOME HEALTH AGENCIES SERVING [place]   Agencies that are Medicare-Certified and are affiliated with [facility]  Telephone Number Address  [facility]   The [facility] has ownership interest in this company; however, you are under no obligation to use this agency. [phone] or  [phone] [address] [place], [place] [address]   Agencies that are Medicare-Certified and are not affiliated with [facility]                                                                                 Home Health Agency Telephone Number Address  [facility] [phone] Fax [phone] [address], [address] [place], [place]  [address]  [facility] [phone] or [phone] Fax [phone] [address] [place], [place] [address]  [facility] [phone] Fax [phone] [address] [place], [place] [address]  [facility] [phone] Fax [phone] [address], [address] [place], [place]  [address]  [facility] [phone] Fax [phone] [address], [address] [place], [place] [address]  [facility] [phone] [address] [place], [place] [address]  [facility] [phone]  [address] [place], [place] [address]  [facility] [phone] or [phone] Fax [phone] [address] [place], [place]  [address]  [facility] [phone] Fax [phone] [address] [place], [place]  [address]  [facility]  [phone] Fax [phone] [address] [place], [place] [address]               Agencies that are not Medicare-Certified and are not affiliated with [facility]   Home Health Agency Telephone Number Address  [facility] [phone] or [phone] Fax [phone] [address] [place], [place]  [address]  [facility] [phone] Fax [phone] [address] [place], [place]  [address]  [facility]  [phone] Fax [phone] [address] [place], [place] [address]  [facility] [phone] Fax [phone] [address] , [place] [address]  [facility] [phone] or [phone] Fax [phone] [address] [place], [place]  [address]  [facility] [phone] or [phone] Fax [phone] [address] [place], [place]  [address]  [facility] [phone] Fax [phone] [address] [place], [place]  [address]  [facility] [phone] [address] [place], [place]  [address]  [facility] [phone] Fax [phone] [address] [place], [place]  [address]  [facility] [phone] Fax [phone] [address] [place], [place]  [address]  [facility] [phone] Fax [phone] [address] [place], [place] [address]  [facility] [phone] Fax [phone] [address] [place],   [address]   

## 2012-02-11 NOTE — Progress Notes (Signed)
Patient educated on self care of JP drain.  RN demonstrated how to empty and recharge the drain.  Pt was able to demonstrate the technique back to nurse. No questions or concerns voiced by patient.  RN also discussed the use of a leg bag for patients foley catheter.  Patient did not want to demonstrate attaching and removing the leg bag at this time, but stated that he felt comfortable with doing this at home.  Will continue to monitor and encourage patient to ask questions.

## 2012-02-11 NOTE — Consult Note (Signed)
WOC ostomy  Stoma type/location: loop ileostomy, RUQ. 1 1/2 per WOC note 02/10/12 Stomal assessment/size:  Pink, moist with liq brown output. Support cath under stoma, sutured in place.  Pouched with convex 1pc per WOC nurse yesterday, no leakage. Support rod pouched Peristomal assessment: did not remove pouch today Output fairly high output stoma, pt reports he has emptied independently multiple times during the day and last evening.  He describes to me as well how to clean the pouch opening.  Ostomy pouching: 1pc. Convex with belt   Education provided: reviewed diet and hydration needs with pt.  He verbalized understanding of replacement of fluids with his high outpt stoma.  Reports he likes Gatorade which will work well for fluid and electrolyte needs.  Education on no real diet restrictions, but that some foods like corn, celery, mushrooms etc will need to be chewed thoroughly.    Would recommend [facility] for further education on ostomy skin care, diet, and pouching.  Mother at bedside, encouraged pt for independence with ostomy however mother will provide support as well in the home. 4 convex ostomy pouches in room, reminded pt to take these home when dc.  Possible dc to home tom. Per pts report  Ledford Goodson [place], [place] [phone]

## 2012-02-11 NOTE — Progress Notes (Signed)
T:  98.5           BP: 156/79                    P: 73                R: 18 Foely draining clear urine.  JP darinage  Decreasing  20 ml yesterday. Can go home with Foley and JP. Will follow as outpatient

## 2012-02-12 MED ORDER — HYDROCODONE-ACETAMINOPHEN 5-325 MG PO TABS: 1.0000 | ORAL_TABLET | Freq: Four times a day (QID) | ORAL | Status: DC | PRN

## 2012-02-12 NOTE — Discharge Instructions (Signed)
DISCHARGE INSTRUCTIONS TO PATIENT  Activity:  Driving - 3 to 4 days if doing well   Lifting - no lifting > 15 pounds for 3 to 4 weeks.  Wound Care:   May shower.  Check with Dr. Brunilda Payor regarding the drains.       Dr. Brunilda Payor will manage the right flank drain and foley catheter.       Home Health Care should help with the ostomy care.  Diet:  As tolerated  Follow up appointment:  Call Dr. Allene Pyo office [facility]) at [phone] for an appointment in 5-10 days.      Bring extra ostomy bag with you when you come to the office.  I will probably take the red rubber catheter out from under the ostomy.  Medications and dosages:  Resume your home medications.  You have a prescription for:  Vicodin  Call Dr. Ezzard Standing or his office  [phone]) if you have:  Temperature greater than 100.4,  Persistent nausea and vomiting,  Severe uncontrolled pain,  Redness, tenderness, or signs of infection (pain, swelling, redness, odor or green/yellow discharge around the site),  Difficulty breathing, headache or visual disturbances,  Any other questions or concerns you may have after discharge.  In an emergency, call 911 or go to an Emergency Department at a nearby hospital.

## 2012-02-12 NOTE — Progress Notes (Signed)
T: 98.2     BP: 137/87     P; 90    R: 17 Foley draining well. Urine clear. JP drainage: 28 ml yesterday. Home today with foley to leg bag. Have asked patient to record JP drainage. If it remains minimal, will remove Foley. Follow-up as outpatient.

## 2012-02-12 NOTE — Progress Notes (Signed)
General Surgery Note  LOS: 6 days  POD# 6 Rm - 1317.  Assessment/Plan:  1. LAPAROSCOPIC LOW ANTERIOR RESECTION, protective loop ileostomy, appendectomy - for rectal cancer - 02/06/2012 - D. [employer] working. On regular diet. Ready for discharge.  Instructions reviewed. Pathology - T3 tumor, 4/10 nodes positive - ypT3, ypN2, margins clear. Home health care nurse consulted for d/c. D/C dictated - #[id number]. 2.  Right ureteral exploration - 02/06/2012 - M. Nesi Hydroureter, right, secondary to nephrolithiasis  Foley and JP drain in place.  To be followed by Dr. Brunilda Payor. 3.  HYPERTENSION. 4.  DVT proph - SQ heparin.    Subjective:  Doing well.  Ready for discharge. Objective:   Filed Vitals:   02/12/12 0500  BP: 137/87  Pulse: 90  Temp: 98.2 F (36.8 C)  Resp: 17     Intake/Output from previous day:  03/06 0701 - 03/07 0700 In: 723 [P.O.:720; I.V.:3] Out: 853 [Urine:825; Drains:28]  Intake/Output this shift:      Physical Exam:   General: WN AA M who is alert and oriented. Feels better.  Tolerating full liquids.   HEENT: Normal. Pupils equal. Good dentition. .   Lungs: Clear.   Abdomen: Soft.  Has bowel sounds.     Wound: Okay.  Wound looks good.  Ostomy functioning.   Neurologic:  Grossly intact to motor and sensory function.   Psychiatric: Has normal mood and affect. Behavior is normal   Lab Results:    No results found for this basename: WBC:2,HGB:2,HCT:2,PLT:2 in the last 72 hours  BMET   No results found for this basename: NA:2,K:2,CL:2,CO2:2,GLUCOSE:2,BUN:2,CREATININE:2,CALCIUM:2 in the last 72 hours  PT/INR  No results found for this basename: LABPROT:2,INR:2 in the last 72 hours  ABG  No results found for this basename: PHART:2,PCO2:2,PO2:2,HCO3:2 in the last 72 hours   Studies/Results:  No results found.   Anti-infectives:   Anti-infectives     Start     Dose/Rate Route Frequency Ordered Stop   02/06/12 1800   cefOXitin (MEFOXIN) 1 g in dextrose 5 %  50 mL IVPB        1 g 100 mL/hr over 30 Minutes Intravenous Every 6 hours 02/06/12 1605 02/07/12 0706   02/06/12 0534   cefOXitin (MEFOXIN) 1 g in dextrose 5 % 50 mL IVPB        1 g 100 mL/hr over 30 Minutes Intravenous 60 min pre-op 02/06/12 0534 02/06/12 1222          Ovidio Kin, MD, FACS Pager: [phone],   [facility] Office: [phone] 02/12/2012

## 2012-02-12 NOTE — Op Note (Signed)
[name], [name]              ACCOUNT NO.:  [id number]  MEDICAL RECORD NO.:  [id number]  LOCATION:  1317                         FACILITY:  [facility]  PHYSICIAN:  Sandria Bales. Ezzard Standing, M.D.  DATE OF BIRTH:  [birth year]  DATE OF Admission:  02/06/2012 Date of Discharge:  02/12/2012                              Discharge Summary   DISCHARGE DIAGNOSES: 1. Rectal cancer ypT3, ypN2 with clear margins.  He had 4 of 10 nodes     positive. 2. Right ureteral obstruction secondary to stone disease. 3. Hypertension. 4. Probable metastatic disease to his liver.  OPERATIONS PERFORMED:  The patient underwent a low anterior resection with mobilization of splenic flexure, a protective loop ileostomy, an appendectomy, a right ureteral exploration, and cystoscopy with stent placement by Dr. Ovidio Kin and Dr. Su Grand on February 06, 2012.  HISTORY OF ILLNESS:  [name] [name] is a 61 year old African American male of Dr. Darryll Capers who had noticed some rectal bleeding, approximately 1 year ago.  He saw Dr. Sheryn Bison.  He did a colonoscopy on September 03, 2011.  Dr. Jarold Motto identified a 5 cm villous necrotic tumor which on biopsy showed invasive adenocarcinoma.  A CT scan revealed a large 4 cm rectal mass with perirectal changes concerning for local extension.  He also had multiple hepatic hypodensities and a chronically obstructed right ureter secondary to stone disease.  He underwent neoadjuvant chemo radiation by Dr. Mancel Bale and Dr. Margaretmary Dys which ended about the beginning of December.  His CEA has returned to normal.  There was an attempted biopsy in his liver and the tissue came back benign, however, there is a question whether the right area was captured by invasive radiology.  The patient's other medical issues are that of hypertension and has smoked cigarettes, but he is trying to quit.  HOSPITAL COURSE:  He now comes at this time to resect his primary tumor and to evaluate  the right ureter.  On the day of admission, he underwent a laparoscopic exploration with a low anterior resection and mobilization of splenic flexure and appendectomy.  Also I tried to do an ultrasound of his liver in which I could feel nodules in both the right and left lobes of his liver, but I could not identify them well by ultrasound nor was able to biopsy them.    Dr. Brunilda Payor explored his right ureter  and had placed ureteral stents before surgery.  At the end of the surgery, I did a protective loop ileostomy in the right lower quadrant.  His postop course, he did well.  On his 1st postoperative day, his hemoglobin was 12.5, white blood count 13000.  His sodium was 141, creatinine 1.29.  He was begun on liquids about the 3rd postoperative day.  He had a little bit increased output of his JP drain that lay near his right ureter, so Dr. Brunilda Payor did put the Foley back in him.  He was kept on subcu heparin postop for DVT prophylaxis.  He is now 6 days postop.  He is afebrile.  He is tolerating a regular diet.  He has a Foley and the JP drain which will be managed by Dr.  Su Grand and he is ready for discharge.  His discharge instructions include a regular diet.   He can shower.   We arranged home health care to help him manage his ostomy.   He is to see me back in 7-10 days for wound check, staple removal, and I will remove the bridge under his ileostomy.  He will follow up Dr. Brunilda Payor regarding his right JP drain and Foley for how long that will stay in.  He is to make appointment with Dr. Mancel Bale for followup from an oncology standpoint.  His final pathology showed an invasive adenocarcinoma with abundant extracellular mucin that invaded into the pericolonic fat.  He had 4 of 10 nodes positive.  So his pathologic staging was ypT3, ypN2.  Also, of note, he had at least one palpable nodule about 1.5 to 2 cm in size in middle of his left lobe of his liver and one palpable nodule in  the middle of his right lobe of his liver which would be consistent with probable metastatic disease to his liver.  His discharge condition was good.   Sandria Bales. Ezzard Standing, M.D., FACS   DHN/MEDQ  D:  02/12/2012  T:  02/12/2012  Job:  [id number]  cc:   Ladene Artist, M.D. Stacie Glaze, MD Lindaann Slough, M.D. Vania Rea. Jarold Motto, MD, Caleen Essex, FAGA Oneita Hurt, M.D.

## 2012-02-12 NOTE — Progress Notes (Signed)
Pt d'c home. Waited for wheelchair while CNA went looking for one, decided to walk to car said the wait was too long. Education provided about d'c instructions.

## 2012-02-16 ENCOUNTER — Encounter (HOSPITAL_COMMUNITY): Payer: Self-pay | Admitting: Surgery

## 2012-02-18 ENCOUNTER — Ambulatory Visit (INDEPENDENT_AMBULATORY_CARE_PROVIDER_SITE_OTHER): Payer: 59 | Admitting: Surgery

## 2012-02-18 ENCOUNTER — Encounter (INDEPENDENT_AMBULATORY_CARE_PROVIDER_SITE_OTHER): Payer: Self-pay | Admitting: Surgery

## 2012-02-18 VITALS — BP 114/70 | HR 76 | Temp 97.1°F | Resp 12 | Ht 70.0 in | Wt 164.0 lb

## 2012-02-18 DIAGNOSIS — C20 Malignant neoplasm of rectum: Secondary | ICD-10-CM

## 2012-02-18 NOTE — Progress Notes (Addendum)
ASSESSMENT AND PLAN: 1.  Rectal Cancer.  Path -  ypT3, ypN2, 4/10 nodes positive.   KRAS mutation detected.  Completed neoadjuvant therapy by Drs. Sherrill and [place] end of Nov/beginning of Dec 2012.  He had LAR - 02/06/2012.  Has diverting ileostomy.  He'll see me back in 6 weeks.  He has a follow up to see Dr. Truett Perna soon.  2.  Hepatic nodules.  ? Mets?  Needle aspiration of right lobe of liver by Dr. Jacqualine Code - 12/22/2011 - Benign.   But he had firm nodules in both lobes palpable at the time of surgery.   3.  Right hydroureter secondary to nephrolithiasis.  Dr. Brunilda Payor explored at surgery, but could not remove stone.  He still has a drain in his right flank.  [Creatinine up on pre op labs to 2.35 - 03/04/2012.  His on for power port 03/10/2012.  DN  03/07/2012]  4.  Hypertension. 5.  Smokes.  We talked about trying to quit before surgery.  Even cutting back would be good.   Chief Complaint  Patient presents with  . Routine Post Op    staple removal s/p colon resection 02/06/12   REFERRING PHYSICIAN:  Sheryn Bison, M.D.  HISTORY OF PRESENT ILLNESS: [name] [name] is a 61 y.o. (DOB: [birth year])  AA male whose primary care physician is Carrie Mew, MD, MD and comes to me today for follow for surgery on a rectal cancer.  The patient has had some rectal bleeding for about one year.   He saw Dr. Jarold Motto who did a colonoscopy on him on 09/03/2011.  Dr. Jarold Motto saw in Mr. Feeley's rectum, there was a 5 cm villous/necrotic tumor.  Biopsy of the rectal tumor read by Dr. [place] Rund showed invasive adenocarcinoma (Case No.: [id number].)  The patient also had a CT scan the same day.  The CT scan showed: 1. Large 4 cm rectal mass. 2. Left perirectal mass is concerning for local extension of the tumor. 3. Multiple hepatic hypodensities are concerning for hepatic metastasis. 4. Chronic right hydronephrosis secondary to a obstructing calculus in the mid right ureter.  He completed  neoadjuvant therapy by Drs. Sherrill and [place] end of Nov/beginning of Dec 2012.    He had his LAR 02/06/2012 and has a ileostomy.  He is doing well.  He still has a right LQ drain being managed by Dr. Brunilda Payor.  Past Medical History  Diagnosis Date  . Hypertension   . Abnormal EKG 11/01/11    REPORT IN EPIC-PT STATES NO KNOWN HEART PROBLEMS-NO CHEST PAINS  . GERD (gastroesophageal reflux disease)   . 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 Low blood potassium     POTASSIUM WAS 2.1 ON 11/01/11 VISIT TO ER--PT PUT ON ORAL POTASSIUM--MOST RECENT LAB 11/24/11 POTASSIUM IMPROVED TO 3.1  . Hydronephrosis of right kidney     "Chronic" -SECONDARY TO CALCULUS MID RT URETER AND ADDITIONAL CALCULI WITHIN RT KIDNEY--PT STATES HE HAS PASSED A STONE IN THE PAST--DID NOT KNOW HE HAD MORE STONES UNTIL CT ABD DONE ON 09/03/11  . History of radiation therapy 09/29/11-11/07/11    rectal ca  . Heart murmur     SINCE BIRTH--DOES NOT CAUSE ANY PROBLEMS  . Dyspnea     no recent bronchitis  . Rectal cancer     HAS COMPLETED RADIATION AND XELODA--PLAN RESTAGING TESTING.  [facility]  . Cancer     rectal adenocarcinoma stage [id number]  .  Nasal congestion   . COPD (chronic obstructive pulmonary disease)   . Sleep apnea     stop-bang score =6    Current Outpatient Prescriptions  Medication Sig Dispense Refill  . amLODipine (NORVASC) 5 MG tablet Take 5 mg by mouth daily after lunch daily after lunch.       . bisoprolol-hydrochlorothiazide (ZIAC) 5-6.25 MG per tablet Take 1 tablet by mouth daily after lunch daily after lunch.       . Fesoterodine Fumarate (TOVIAZ) 8 MG TB24 Take 8 mg by mouth daily as needed. overactive bladder      . oxyCODONE-acetaminophen (PERCOCET) 5-325 MG per tablet Take 1-2 tablets by mouth every 6 (six) hours as needed. Pain        No Known Allergies  REVIEW OF SYSTEMS: Cardiac:  Hypertension x 10 years. No history of heart disease.  No history of  prior cardiac catheterization.  No history of seeing a cardiologist. Pulmonary:  Smokes cigarettes - 1-1 1/2 PPD. No OSA/CPAP.  Gastrointestinal:  See HPI. Urologic:  History of kidney stones.  Sees Dr. Irving Burton. Musculoskeletal:  Bilateral shoulder pain.  Not seeing anyone for this.  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 No family history of colon cancer.  PHYSICAL EXAM: BP 114/70  Pulse 76  Temp(Src) 97.1 F (36.2 C) (Temporal)  Resp 12  Ht 5\' 10"  (1.778 m)  Wt 164 lb (74.39 kg)  BMI 23.53 kg/m2  General: WN BM. HEENT: Normal. Pupils equal. Normal dentition. Lungs: Clear and symmetric. Heart:  RRR. No murmur.  Abdomen: Ileostomy RLQ.  Bridge removed from under ileostomy.  Staples removed. Abdomen soft. Rectal:  Not examined Extremities:  Good strength in upper and lower extremities. Neurologic:  Grossly intact to motor and sensory function.  DATA REVIEWED: Path report - ypT3, ypN2 CEA - 12/01/2011 - 1.6  (was 13.8 from October 2012)  Ovidio Kin, M.D., [facility], [place] [phone]

## 2012-02-24 ENCOUNTER — Telehealth (INDEPENDENT_AMBULATORY_CARE_PROVIDER_SITE_OTHER): Payer: Self-pay

## 2012-02-24 NOTE — Telephone Encounter (Signed)
Please call Fonnie Mu at [facility] [phone]- for continued orders for ostomy care and monthly foley care for [facility]- DOB 1950/12/17- ok fax order- [phone]

## 2012-02-25 ENCOUNTER — Telehealth: Payer: Self-pay | Admitting: Oncology

## 2012-02-25 ENCOUNTER — Telehealth (INDEPENDENT_AMBULATORY_CARE_PROVIDER_SITE_OTHER): Payer: Self-pay | Admitting: General Surgery

## 2012-02-25 ENCOUNTER — Ambulatory Visit (HOSPITAL_BASED_OUTPATIENT_CLINIC_OR_DEPARTMENT_OTHER): Payer: 59 | Admitting: Nurse Practitioner

## 2012-02-25 VITALS — BP 104/78 | HR 76 | Temp 97.2°F | Ht 70.0 in | Wt 160.6 lb

## 2012-02-25 DIAGNOSIS — C20 Malignant neoplasm of rectum: Secondary | ICD-10-CM

## 2012-02-25 DIAGNOSIS — C801 Malignant (primary) neoplasm, unspecified: Secondary | ICD-10-CM

## 2012-02-25 NOTE — Progress Notes (Signed)
OFFICE PROGRESS NOTE  Interval history:  [name] [name] returns as scheduled. He underwent a low anterior resection of the distal sigmoid colon and rectum, loop ileostomy, appendectomy by Dr. Ezzard Standing on 02/06/2012. Final pathology showed a 3.5 cm invasive adenocarcinoma with abundant extracellular mucin invading through the muscularis propria into pericolonic fatty tissue; the resection margins were clear; 4 of 10 pericolic lymph nodes were positive for metastatic carcinoma (ypT3,ypN2). Lymph-vascular invasion not identified; perineural invasion not identified. K-ras mutation was detected. There was no evidence of malignancy involving the appendix. Ultrasound of both the right and left lobes of the liver was performed. There was a hemangioma of the left lobe of the liver. A specific mass was not seen within the liver. Dr. Ezzard Standing palpated the liver. There was a 1.5 cm nodule in the mid left lobe of the liver and about a 1.5 cm nodule lateral in the right lobe of the liver. The nodules seemed to correlate with at least 2 of the nodules seen on MRI. A biopsy could not be performed. Dr. Brunilda Payor replaced a JJ stent during surgery.  [name] [name] feels he is recovering well from the surgery. He has occasional pain at the low abdomen. He denies nausea/vomiting. He overall has a good appetite. Output from the ostomy varies.   Objective: Blood pressure 104/78, pulse 76, temperature 97.2 F (36.2 C), temperature source Oral, height 5\' 10"  (1.778 m), weight 160 lb 9.6 oz (72.848 kg).  Oropharynx is without thrush or ulceration. Lungs are clear. No wheezes or rales. Regular cardiac rhythm. Abdomen is soft and nontender. No hepatomegaly. Right-sided ostomy. Extremities are without edema.  Lab Results: Lab Results  Component Value Date   WBC 12.8* 02/08/2012   HGB 11.5* 02/08/2012   HCT 33.4* 02/08/2012   MCV 97.7 02/08/2012   PLT 206 02/08/2012    Chemistry:    Chemistry      Component Value Date/Time   NA 139 02/08/2012  0336   K 4.0 02/08/2012 0336   CL 108 02/08/2012 0336   CO2 27 02/08/2012 0336   BUN 16 02/08/2012 0336   CREATININE 1.19 02/08/2012 0336      Component Value Date/Time   CALCIUM 8.6 02/08/2012 0336   ALKPHOS 121* 02/04/2012 1100   AST 14 02/04/2012 1100   ALT 11 02/04/2012 1100   BILITOT 0.3 02/04/2012 1100       Studies/Results: No results found.  Medications: I have reviewed the patient's current medications.  Assessment/Plan:  1. Metastatic rectal cancer status post biopsy of rectal mass 09/03/2011 with pathology showing invasive adenocarcinoma. Staging CT scans 09/03/2011 showed a 10 mm hypodense rounded lesion in left lateral hepatic lobe, similar 6 mm lesion in the superior right hepatic lobe and a smaller subcapsular lesion in the lateral right hepatic lobe; rectal mass within the lumen of the bowel emanating from the left wall measuring 4.9 x 3.5 cm and a mass within the low left perirectal fat measuring 2.3 x 3.2 cm consistent with local extension of carcinoma. Staging PET scan 09/18/2011 showed a 5.1 cm distal sigmoid/rectal mass with max SUV 19.2 with local extension into the left perirectal fat with max SUV 7.7. At least 2 suspected hepatic metastases with max SUV 6.7 in the lateral segment left hepatic lobe and max SUV 4.1 in the lateral right hepatic dome. Additional tiny hypodensities in the liver may be cysts; patchy/nodular opacity in the left lower lobe with max SUV 3.5 worrisome for pulmonary metastasis, less likely infectious. He began radiation  and concurrent Xeloda chemotherapy on 09/29/2011. He completed radiation on 11/06/2011. MRI of the liver on 12/08/2011 showed 2 hypermetabolic liver lesions, similar in size to 09/03/2011. 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 Equivocal 7 mm lesion in the inferior right hepatic lobe. No evidence of extrahepatic metastasis within the abdomen. An ultrasound-guided biopsy of a small  left hepatic lesion on 12/23/2011 was nondiagnostic. He underwent a low anterior resection with loop ileostomy on 02/06/2012 with final pathology showing a 3.5 cm invasive adenocarcinoma with abundant extracellular mucin invading through the muscularis propria into pericolonic fatty tissue; 4 of 10 pericolonic lymph nodes were positive for metastatic carcinoma; resection margins were clear (ypT3,ypN2). 2. K-ras mutation detected. 3. Rectal pain and bleeding secondary to #1. Improved. 4. Frequent loose stools secondary to #1. Improved. 5. Hypertension. 6. Tobacco use. 7. CT scan 09/03/2011 with chronic right hydronephrosis secondary to an obstructing calculus in the mid right ureter. He is followed by Dr. Brunilda Payor.  8. Status post cystoscopy, right retrograde pyelogram, ureteroscopy and insertion of right double-J stent 11/28/2011. The double-J stent was replaced 02/06/2012. 9. Elevated creatinine. Stable on labs 12/15/2011.  10. Hypokalemia-resolved. 11. Status post ultrasound-guided fine-needle aspiration of small left hepatic lesion 12/22/2011. The pathology revealed no malignancy.  Disposition-Dr. Truett Perna reviewed the final pathology with [name] [name]. Dr. Truett Perna recommends adjuvant chemotherapy with oxaliplatin/5 fluorouracil. We discussed the FOLFOX and CAPOX regimens. We reviewed potential toxicities including myelosuppression, nausea, diarrhea, mouth sores. We discussed the neurotoxicity associated with oxaliplatin. [name] [name] is unsure about proceeding with additional chemotherapy. He is agreeable to a referral to Dr. Ezzard Standing for Port-A-Cath placement. We tentatively scheduled him to begin adjuvant chemotherapy on 03/09/2012. We will see him in followup prior to treatment on 03/09/2012. He will contact the office prior to that visit with his decision.  Patient seen with Dr. Truett Perna.  Lonna Cobb ANP/GNP-BC

## 2012-02-25 NOTE — Telephone Encounter (Signed)
Called Morrie Sheldon at [facility] and gave her orders to continued ostomy care and monthly foley care

## 2012-02-25 NOTE — Telephone Encounter (Signed)
appts made and printed for pt,pt advised that we will get back with him for the 4/9 appt.  I l/m wth ccs to call pt or [place] with port appt,pt is estab with dr Ezzard Standing      aom

## 2012-03-03 ENCOUNTER — Other Ambulatory Visit (INDEPENDENT_AMBULATORY_CARE_PROVIDER_SITE_OTHER): Payer: Self-pay | Admitting: Surgery

## 2012-03-04 ENCOUNTER — Encounter (HOSPITAL_COMMUNITY): Payer: Self-pay

## 2012-03-04 ENCOUNTER — Ambulatory Visit (HOSPITAL_COMMUNITY)
Admission: RE | Admit: 2012-03-04 | Discharge: 2012-03-04 | Disposition: A | Discharge status: Home / Self Care | Payer: 59 | Source: Ambulatory Visit | Attending: Anesthesiology | Admitting: Anesthesiology

## 2012-03-04 ENCOUNTER — Encounter (HOSPITAL_COMMUNITY)
Admission: RE | Admit: 2012-03-04 | Discharge: 2012-03-04 | Disposition: A | Discharge status: Home / Self Care | Payer: 59 | Source: Ambulatory Visit | Attending: Surgery | Admitting: Surgery

## 2012-03-04 DIAGNOSIS — Z01818 Encounter for other preprocedural examination: Secondary | ICD-10-CM | POA: Insufficient documentation

## 2012-03-04 DIAGNOSIS — I10 Essential (primary) hypertension: Secondary | ICD-10-CM | POA: Insufficient documentation

## 2012-03-04 DIAGNOSIS — Z01812 Encounter for preprocedural laboratory examination: Secondary | ICD-10-CM | POA: Insufficient documentation

## 2012-03-04 HISTORY — DX: Other specified health status: Z78.9

## 2012-03-04 HISTORY — DX: Acute upper respiratory infection, unspecified: J06.9

## 2012-03-04 LAB — COMPREHENSIVE METABOLIC PANEL
Albumin: 3.7 g/dL (ref 3.5–5.2)
Alkaline Phosphatase: 120 U/L — ABNORMAL HIGH (ref 39–117)
BUN: 52 mg/dL — ABNORMAL HIGH (ref 6–23)
Calcium: 9.9 mg/dL (ref 8.4–10.5)
GFR calc Af Amer: 32 mL/min — ABNORMAL LOW (ref >90)
Glucose, Bld: 103 mg/dL — ABNORMAL HIGH (ref 70–99)
Potassium: 4.6 mEq/L (ref 3.5–5.1)
Total Protein: 7.4 g/dL (ref 6.0–8.3)

## 2012-03-04 LAB — CBC
MCH: 34.5 pg — ABNORMAL HIGH (ref 26.0–34.0)
MCHC: 36.2 g/dL — ABNORMAL HIGH (ref 30.0–36.0)
Platelets: 250 10*3/uL (ref 150–400)
RBC: 3.85 MIL/uL — ABNORMAL LOW (ref 4.22–5.81)
RDW: 12.2 % (ref 11.5–15.5)

## 2012-03-04 LAB — PROTIME-INR: Prothrombin Time: 14.6 seconds (ref 11.6–15.2)

## 2012-03-04 NOTE — Pre-Procedure Instructions (Signed)
20 [name] [name]  03/04/2012   Your procedure is scheduled on:  Wednesday  03/10/12    Report to [facility] at 800 AM.  Call this number if you have problems the morning of surgery: [phone]   Remember:   Do not eat food:After Midnight.  May have clear liquids: up to 4 Hours before arrival.  Clear liquids include soda, tea, black coffee, apple or grape juice, broth.  Take these medicines the morning of surgery with A SIP OF WATER: AMLODIPINE, ZIAC, OXYCODONE( STOP ASPIRIN, COUMADIN, PLAVIX, EFFIENT, HERBAL MEDICINES)    Do not wear jewelry, make-up or nail polish.  Do not wear lotions, powders, or perfumes. You may wear deodorant.  Do not shave 48 hours prior to surgery.  Do not bring valuables to the hospital.  Contacts, dentures or bridgework may not be worn into surgery.  Leave suitcase in the car. After surgery it may be brought to your room.  For patients admitted to the hospital, checkout time is 11:00 AM the day of discharge.   Patients discharged the day of surgery will not be allowed to drive home.  Name and phone number of your driver: SPOUSE  Special Instructions: CHG Shower Use Special Wash: 1/2 bottle night before surgery and 1/2 bottle morning of surgery.   Please read over the following fact sheets that you were given: Pain Booklet, MRSA Information and Surgical Site Infection Prevention

## 2012-03-05 NOTE — Consult Note (Addendum)
Anesthesia:  Patient is a 61 year old male scheduled for a Power Port Placement on 03/10/12.  He is s/p cystoscopy with insertion of left ureteral catheter and laparoscopic low anterior colon resection of distal sigmoid colon and rectum, appendectomy, and protective loop ileostomy for what was found to be metastatic rectal cancer (adenocarcinoma) on 02/06/12.  Adjuvant chemotherapy has now been recommended.  Other history includes HTN, smoking, OSA, COPD, arthritis, heart murmur.  He is also s/p a cystoscopy and ureteroscopy and right ureteral stent placement for right ureteral calculus and right hydronephrosis on 11/28/11.  CXR on 03/04/12 showed no active disease.  EKG on 11/01/11 showed SR with PAC, P wave abnormality, possible anteroseptal infarct (age undetermined), borderline ST/T wave changes in inferolateral leads.  There are no comparison EKGs.  No CV symptoms were reported at PAT, he has no known CAD or DM history, and he has since tolerated a 6 1/2 hour operative procedure under GA in March and a Urological procedure in December.    Labs reviewed.  BUN/Cr 52/2.35.  This is up from 02/08/12 when his Cr was 1.19.  His Urologist is Dr. Brunilda Payor.  His PCP in Epic is listed as Dr. Darryll Capers.  He will at least need a repeat Cr preoperatively.  ?Re-evaluation by Dr. Brunilda Payor or Dr. Lovell Sheehan.  I will notify Dr. Ezzard Standing, and follow-up once I receive additional recommendations, if any.    Addendum:  03/08/12 1310  Dr. Ezzard Standing has reviewed pre-operative labs.  He agrees with repeating a BMET on the day of surgery.  He also requested that Dr. Brunilda Payor be made aware of his lab results from 03/04/12.  I called his BUN/Cr results to [facility] at [facility].

## 2012-03-07 ENCOUNTER — Other Ambulatory Visit: Payer: Self-pay | Admitting: Oncology

## 2012-03-08 ENCOUNTER — Telehealth: Payer: Self-pay | Admitting: *Deleted

## 2012-03-08 ENCOUNTER — Other Ambulatory Visit: Payer: Self-pay | Admitting: *Deleted

## 2012-03-08 NOTE — Telephone Encounter (Signed)
Left VM for patient to call office and give status of his decision regarding chemo and PAC. Reminded of his appointment on 03/09/12 at [facility].

## 2012-03-09 ENCOUNTER — Other Ambulatory Visit (HOSPITAL_BASED_OUTPATIENT_CLINIC_OR_DEPARTMENT_OTHER): Payer: 59 | Admitting: Lab

## 2012-03-09 ENCOUNTER — Ambulatory Visit (HOSPITAL_BASED_OUTPATIENT_CLINIC_OR_DEPARTMENT_OTHER): Payer: 59 | Admitting: Nurse Practitioner

## 2012-03-09 ENCOUNTER — Ambulatory Visit (HOSPITAL_BASED_OUTPATIENT_CLINIC_OR_DEPARTMENT_OTHER): Payer: 59 | Admitting: Lab

## 2012-03-09 ENCOUNTER — Inpatient Hospital Stay: Payer: 59

## 2012-03-09 VITALS — BP 116/71 | HR 112 | Temp 97.1°F | Ht 70.0 in | Wt 159.2 lb

## 2012-03-09 DIAGNOSIS — C20 Malignant neoplasm of rectum: Secondary | ICD-10-CM

## 2012-03-09 DIAGNOSIS — C787 Secondary malignant neoplasm of liver and intrahepatic bile duct: Secondary | ICD-10-CM

## 2012-03-09 LAB — CBC WITH DIFFERENTIAL/PLATELET
Basophils Absolute: 0 10*3/uL (ref 0.0–0.1)
EOS%: 1.1 % (ref 0.0–7.0)
Eosinophils Absolute: 0.1 10*3/uL (ref 0.0–0.5)
HCT: 36 % — ABNORMAL LOW (ref 38.4–49.9)
HGB: 12.9 g/dL — ABNORMAL LOW (ref 13.0–17.1)
MCH: 33.9 pg — ABNORMAL HIGH (ref 27.2–33.4)
MCV: 94.7 fL (ref 79.3–98.0)
MONO%: 4.2 % (ref 0.0–14.0)
NEUT#: 7.3 10*3/uL — ABNORMAL HIGH (ref 1.5–6.5)
NEUT%: 82.2 % — ABNORMAL HIGH (ref 39.0–75.0)
Platelets: 223 10*3/uL (ref 140–400)
nRBC: 0 % (ref 0–0)

## 2012-03-09 LAB — COMPREHENSIVE METABOLIC PANEL
ALT: 14 U/L (ref 0–53)
AST: 13 U/L (ref 0–37)
Calcium: 9.7 mg/dL (ref 8.4–10.5)
Chloride: 111 mEq/L (ref 96–112)
Creatinine, Ser: 2.23 mg/dL — ABNORMAL HIGH (ref 0.50–1.35)
Sodium: 137 mEq/L (ref 135–145)
Total Protein: 7.3 g/dL (ref 6.0–8.3)

## 2012-03-09 MED ORDER — CEFAZOLIN SODIUM 1-5 GM-% IV SOLN
1.0000 g | INTRAVENOUS | Status: AC
  Administered 2012-03-10: 1 g via INTRAVENOUS
  Filled 2012-03-09: qty 50

## 2012-03-09 MED ORDER — CHLORHEXIDINE GLUCONATE 4 % EX LIQD: 1.0000 "application " | Freq: Once | CUTANEOUS | Status: DC

## 2012-03-09 NOTE — Progress Notes (Signed)
OFFICE PROGRESS NOTE  Interval history:  [name] returns as scheduled. He reports the Port-A-Cath is scheduled to be placed tomorrow. He overall feels well. He denies pain. No nausea or vomiting. Colostomy is functioning.   Objective: Blood pressure 116/71, pulse 112, temperature 97.1 F (36.2 C), temperature source Oral, height 5\' 10"  (1.778 m), weight 159 lb 3.2 oz (72.213 kg).  Oropharynx is without thrush or ulceration. Lungs are clear. Regular cardiac rhythm. Abdomen is soft and nontender. No hepatomegaly. Extremities are without edema. Calves are soft and nontender.  Lab Results: Lab Results  Component Value Date   WBC 8.8 03/09/2012   HGB 12.9* 03/09/2012   HCT 36.0* 03/09/2012   MCV 94.7 03/09/2012   PLT 223 03/09/2012    Chemistry:    Chemistry      Component Value Date/Time   NA 139 03/04/2012 0851   K 4.6 03/04/2012 0851   CL 113* 03/04/2012 0851   CO2 16* 03/04/2012 0851   BUN 52* 03/04/2012 0851   CREATININE 2.35* 03/04/2012 0851      Component Value Date/Time   CALCIUM 9.9 03/04/2012 0851   ALKPHOS 120* 03/04/2012 0851   AST 15 03/04/2012 0851   ALT 16 03/04/2012 0851   BILITOT 0.3 03/04/2012 0851       Studies/Results: Dg Chest 2 View  03/04/2012  *RADIOLOGY REPORT*  Clinical Data: Preop, hypertension  CHEST - 2 VIEW  Comparison: C t chest 09/03/2011  Findings: Cardiomediastinal silhouette is unremarkable.  No acute infiltrate or pleural effusion.  No pulmonary edema.  Mild degenerative changes thoracic spine.  IMPRESSION: No active disease.  Original Report Authenticated By: [name] [name], M.D.    Medications: I have reviewed the patient's current medications.  Assessment/Plan:  1. Metastatic rectal cancer status post biopsy of rectal mass 09/03/2011 with pathology showing invasive adenocarcinoma. Staging CT scans 09/03/2011 showed a 10 mm hypodense rounded lesion in left lateral hepatic lobe, similar 6 mm lesion in the superior right hepatic lobe and a smaller  subcapsular lesion in the lateral right hepatic lobe; rectal mass within the lumen of the bowel emanating from the left wall measuring 4.9 x 3.5 cm and a mass within the low left perirectal fat measuring 2.3 x 3.2 cm consistent with local extension of carcinoma. Staging PET scan 09/18/2011 showed a 5.1 cm distal sigmoid/rectal mass with max SUV 19.2 with local extension into the left perirectal fat with max SUV 7.7. At least 2 suspected hepatic metastases with max SUV 6.7 in the lateral segment left hepatic lobe and max SUV 4.1 in the lateral right hepatic dome. Additional tiny hypodensities in the liver may be cysts; patchy/nodular opacity in the left lower lobe with max SUV 3.5 worrisome for pulmonary metastasis, less likely infectious. He began radiation and concurrent Xeloda chemotherapy on 09/29/2011. He completed radiation on 11/06/2011. MRI of the liver on 12/08/2011 showed 2 hypermetabolic liver lesions, similar in size to 09/03/2011. 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 Equivocal 7 mm lesion in the inferior right hepatic lobe. No evidence of extrahepatic metastasis within the abdomen. An ultrasound-guided biopsy of a small left hepatic lesion on 12/23/2011 was nondiagnostic. He underwent a low anterior resection with loop ileostomy on 02/06/2012 with final pathology showing a 3.5 cm invasive adenocarcinoma with abundant extracellular mucin invading through the muscularis propria into pericolonic fatty tissue; 4 of 10 pericolonic lymph nodes were positive for metastatic carcinoma; resection margins were clear (ypT3,ypN2). 2. K-ras mutation detected.  3. Rectal pain and bleeding secondary to #1. Improved. 4. Frequent loose stools secondary to #1. Improved. 5. Hypertension. 6. Tobacco use. 7. CT scan 09/03/2011 with chronic right hydronephrosis secondary to an obstructing calculus in the mid right ureter. He is followed by Dr. Brunilda Payor.   8. Status post cystoscopy, right retrograde pyelogram, ureteroscopy and insertion of right double-J stent 11/28/2011. The double-J stent was replaced 02/06/2012. 9. Elevated creatinine. Stable on labs 12/15/2011. Creatinine further elevated at 2.35 on 03/04/2012. We are repeating today. 10. Hypokalemia-resolved. 11. Status post ultrasound-guided fine-needle aspiration of small left hepatic lesion 12/22/2011. The pathology revealed no malignancy.  Disposition-[name] has decided to proceed with FOLFOX chemotherapy. We again reviewed potential toxicities. He is scheduled for Port-A-Cath placement on 03/10/2012. We are scheduling him for cycle 1 of FOLFOX 03/15/2012. 4 We will see him in followup prior to cycle 2 on 03/29/2012. We will followup on the kidney function from today. [name] will contact the office prior to his next visit with any problems.  Patient seen with Dr. Truett Perna.Lonna Cobb ANP/GNP-BC

## 2012-03-10 ENCOUNTER — Ambulatory Visit (HOSPITAL_COMMUNITY): Payer: 59

## 2012-03-10 ENCOUNTER — Encounter (HOSPITAL_COMMUNITY): Payer: Self-pay | Admitting: *Deleted

## 2012-03-10 ENCOUNTER — Encounter (HOSPITAL_COMMUNITY): Payer: Self-pay | Admitting: Vascular Surgery

## 2012-03-10 ENCOUNTER — Encounter (HOSPITAL_COMMUNITY)
Admission: RE | Disposition: A | Discharge status: Home / Self Care | Payer: Self-pay | Source: Ambulatory Visit | Attending: Surgery

## 2012-03-10 ENCOUNTER — Ambulatory Visit (HOSPITAL_COMMUNITY): Payer: 59 | Admitting: Vascular Surgery

## 2012-03-10 ENCOUNTER — Ambulatory Visit (HOSPITAL_COMMUNITY)
Admission: RE | Admit: 2012-03-10 | Discharge: 2012-03-10 | Disposition: A | Discharge status: Home / Self Care | Payer: 59 | Source: Ambulatory Visit | Attending: Surgery | Admitting: Surgery

## 2012-03-10 DIAGNOSIS — Z932 Ileostomy status: Secondary | ICD-10-CM | POA: Insufficient documentation

## 2012-03-10 DIAGNOSIS — I10 Essential (primary) hypertension: Secondary | ICD-10-CM | POA: Insufficient documentation

## 2012-03-10 DIAGNOSIS — F17200 Nicotine dependence, unspecified, uncomplicated: Secondary | ICD-10-CM | POA: Insufficient documentation

## 2012-03-10 DIAGNOSIS — N200 Calculus of kidney: Secondary | ICD-10-CM | POA: Insufficient documentation

## 2012-03-10 DIAGNOSIS — C20 Malignant neoplasm of rectum: Secondary | ICD-10-CM | POA: Insufficient documentation

## 2012-03-10 DIAGNOSIS — C189 Malignant neoplasm of colon, unspecified: Secondary | ICD-10-CM

## 2012-03-10 HISTORY — PX: PORTACATH PLACEMENT: SHX2246

## 2012-03-10 LAB — BASIC METABOLIC PANEL
CO2: 15 mEq/L — ABNORMAL LOW (ref 19–32)
Chloride: 112 mEq/L (ref 96–112)
Creatinine, Ser: 2.23 mg/dL — ABNORMAL HIGH (ref 0.50–1.35)
Potassium: 4 mEq/L (ref 3.5–5.1)

## 2012-03-10 SURGERY — INSERTION, TUNNELED CENTRAL VENOUS DEVICE, WITH PORT
Anesthesia: General | Site: Chest | Wound class: Clean

## 2012-03-10 MED ORDER — OXYCODONE HCL 5 MG PO TABS: 5.0000 mg | ORAL_TABLET | Freq: Four times a day (QID) | ORAL | Status: AC | PRN

## 2012-03-10 MED ORDER — LACTATED RINGERS IV SOLN
INTRAVENOUS | Status: DC | PRN
  Administered 2012-03-10: 09:00:00 via INTRAVENOUS

## 2012-03-10 MED ORDER — LIDOCAINE HCL (PF) 1 % IJ SOLN
INTRAMUSCULAR | Status: DC | PRN
  Administered 2012-03-10: 20 mL

## 2012-03-10 MED ORDER — 0.9 % SODIUM CHLORIDE (POUR BTL) OPTIME
TOPICAL | Status: DC | PRN
  Administered 2012-03-10: 1000 mL

## 2012-03-10 MED ORDER — MIDAZOLAM HCL 5 MG/5ML IJ SOLN
INTRAMUSCULAR | Status: DC | PRN
  Administered 2012-03-10 (×2): 1 mg via INTRAVENOUS

## 2012-03-10 MED ORDER — MORPHINE SULFATE 2 MG/ML IJ SOLN: 0.0500 mg/kg | INTRAMUSCULAR | Status: DC | PRN

## 2012-03-10 MED ORDER — METOCLOPRAMIDE HCL 5 MG/ML IJ SOLN
10.0000 mg | Freq: Once | INTRAMUSCULAR | Status: DC | PRN
  Filled 2012-03-10: qty 2

## 2012-03-10 MED ORDER — PROPOFOL 10 MG/ML IV EMUL
INTRAVENOUS | Status: DC | PRN
  Administered 2012-03-10: 50 ug/kg/min via INTRAVENOUS

## 2012-03-10 MED ORDER — SODIUM CHLORIDE 0.9 % IR SOLN
Status: DC | PRN
  Administered 2012-03-10: 10:00:00

## 2012-03-10 MED ORDER — FENTANYL CITRATE 0.05 MG/ML IJ SOLN
INTRAMUSCULAR | Status: DC | PRN
  Administered 2012-03-10 (×2): 50 ug via INTRAVENOUS

## 2012-03-10 MED ORDER — ONDANSETRON HCL 4 MG/2ML IJ SOLN
INTRAMUSCULAR | Status: DC | PRN
  Administered 2012-03-10: 4 mg via INTRAVENOUS

## 2012-03-10 MED ORDER — MORPHINE SULFATE 10 MG/ML IJ SOLN: 0.0500 mg/kg | INTRAMUSCULAR | Status: DC | PRN

## 2012-03-10 MED ORDER — SODIUM CHLORIDE 0.9 % IV SOLN
INTRAVENOUS | Status: DC | PRN
  Administered 2012-03-10 (×2): via INTRAVENOUS

## 2012-03-10 MED ORDER — HEPARIN SOD (PORK) LOCK FLUSH 100 UNIT/ML IV SOLN
INTRAVENOUS | Status: DC | PRN
  Administered 2012-03-10: 400 [IU] via INTRAVENOUS

## 2012-03-10 MED ORDER — FENTANYL CITRATE 0.05 MG/ML IJ SOLN: 25.0000 ug | INTRAMUSCULAR | Status: DC | PRN

## 2012-03-10 MED ORDER — LACTATED RINGERS IV SOLN
INTRAVENOUS | Status: DC
  Administered 2012-03-10: 09:00:00 via INTRAVENOUS

## 2012-03-10 SURGICAL SUPPLY — 49 items
APL SKNCLS STERI-STRIP NONHPOA (GAUZE/BANDAGES/DRESSINGS) ×1
BAG DECANTER FOR FLEXI CONT (MISCELLANEOUS) ×2 IMPLANT
BENZOIN TINCTURE PRP APPL 2/3 (GAUZE/BANDAGES/DRESSINGS) ×2 IMPLANT
BLADE SURG 15 STRL LF DISP TIS (BLADE) ×1 IMPLANT
BLADE SURG 15 STRL SS (BLADE) ×2
CHLORAPREP W/TINT 10.5 ML (MISCELLANEOUS) ×2 IMPLANT
CLOTH BEACON ORANGE TIMEOUT ST (SAFETY) ×2 IMPLANT
COVER SURGICAL LIGHT HANDLE (MISCELLANEOUS) ×2 IMPLANT
CRADLE DONUT ADULT HEAD (MISCELLANEOUS) ×2 IMPLANT
DECANTER SPIKE VIAL GLASS SM (MISCELLANEOUS) ×1 IMPLANT
DRAPE C-ARM 42X72 X-RAY (DRAPES) ×2 IMPLANT
DRAPE LAPAROSCOPIC ABDOMINAL (DRAPES) ×2 IMPLANT
DRAPE UTILITY 15X26 W/TAPE STR (DRAPE) ×4 IMPLANT
ELECT CAUTERY BLADE 6.4 (BLADE) ×2 IMPLANT
ELECT REM PT RETURN 9FT ADLT (ELECTROSURGICAL) ×2
ELECTRODE REM PT RTRN 9FT ADLT (ELECTROSURGICAL) ×1 IMPLANT
GAUZE SPONGE 4X4 16PLY XRAY LF (GAUZE/BANDAGES/DRESSINGS) ×2 IMPLANT
GLOVE BIOGEL PI IND STRL 7.0 (GLOVE) IMPLANT
GLOVE BIOGEL PI INDICATOR 7.0 (GLOVE) ×1
GLOVE ECLIPSE 6.5 STRL STRAW (GLOVE) ×1 IMPLANT
GLOVE SURG SIGNA 7.5 PF LTX (GLOVE) ×2 IMPLANT
GOWN STRL NON-REIN LRG LVL3 (GOWN DISPOSABLE) ×3 IMPLANT
GOWN STRL REIN XL XLG (GOWN DISPOSABLE) ×2 IMPLANT
INTRODUCER 13FR (MISCELLANEOUS) IMPLANT
KIT BASIN OR (CUSTOM PROCEDURE TRAY) ×2 IMPLANT
KIT PORT POWER 9.6FR MRI PREA (Catheter) IMPLANT
KIT PORT POWER ISP 8FR (Catheter) IMPLANT
KIT POWER CATH 8FR (Catheter) IMPLANT
KIT ROOM TURNOVER OR (KITS) ×2 IMPLANT
NDL HYPO 25GX1X1/2 BEV (NEEDLE) ×1 IMPLANT
NEEDLE HYPO 25GX1X1/2 BEV (NEEDLE) ×2 IMPLANT
NS IRRIG 1000ML POUR BTL (IV SOLUTION) ×2 IMPLANT
PACK SURGICAL SETUP 50X90 (CUSTOM PROCEDURE TRAY) ×2 IMPLANT
PAD ARMBOARD 7.5X6 YLW CONV (MISCELLANEOUS) ×4 IMPLANT
PENCIL BUTTON HOLSTER BLD 10FT (ELECTRODE) ×2 IMPLANT
SET SHEATH INTRODUCER 10FR (MISCELLANEOUS) IMPLANT
SHEATH COOK PEEL AWAY SET 9F (SHEATH) IMPLANT
SPONGE GAUZE 4X4 12PLY (GAUZE/BANDAGES/DRESSINGS) ×2 IMPLANT
STRIP CLOSURE SKIN 1/4X4 (GAUZE/BANDAGES/DRESSINGS) ×2 IMPLANT
SUT VIC AB 3-0 SH 18 (SUTURE) ×2 IMPLANT
SUT VIC AB 5-0 PS2 18 (SUTURE) ×2 IMPLANT
SYR 20ML ECCENTRIC (SYRINGE) ×4 IMPLANT
SYR 5ML LUER SLIP (SYRINGE) ×2 IMPLANT
SYR BULB 3OZ (MISCELLANEOUS) ×2 IMPLANT
SYR CONTROL 10ML LL (SYRINGE) ×2 IMPLANT
TAPE CLOTH SURG 4X10 WHT LF (GAUZE/BANDAGES/DRESSINGS) ×1 IMPLANT
TOWEL OR 17X24 6PK STRL BLUE (TOWEL DISPOSABLE) ×2 IMPLANT
TOWEL OR 17X26 10 PK STRL BLUE (TOWEL DISPOSABLE) ×2 IMPLANT
WATER STERILE IRR 1000ML POUR (IV SOLUTION) IMPLANT

## 2012-03-10 NOTE — Brief Op Note (Signed)
03/10/2012  10:37 AM  PATIENT:  [name] [name], 61 y.o., male, MRN: [id number]  PREOP DIAGNOSIS:  rectal cancer  POSTOP DIAGNOSIS:   Rectal cancer, Stage 4  PROCEDURE:   Procedure(s): INSERTION PORT-A-CATH, left subclavian vn  SURGEON:   Ovidio Kin, M.D.  ASSISTANT:   none  ANESTHESIA:   IV sedation  Hart Robinsons, MD - Anesthesiologist Marni Griffon, CRNA - CRNA  General  EBL:  minimal  ml  BLOOD ADMINISTERED: none  DRAINS: none   LOCAL MEDICATIONS USED:   20 cc 1% xylocaine  SPECIMEN:   none  COUNTS CORRECT:  YES  INDICATIONS FOR PROCEDURE:  [name] is a 61 y.o. (DOB: 1951/04/24) AA male whose primary care physician is Carrie Mew, MD, MD and comes for power port placement.   The indications and risks of the surgery were explained to the patient.  The risks include, but are not limited to, infection, bleeding, and nerve injury.  Note dictated to:   #[id number]

## 2012-03-10 NOTE — Op Note (Signed)
[name] [name], [name] [name]              ACCOUNT NO.:  [id number]  MEDICAL RECORD NO.:  [id number]  LOCATION:  [facility]                         FACILITY:  [facility]  PHYSICIAN:  Sandria Bales. Ezzard Standing, M.D.  DATE OF BIRTH:  July 19, 1951  DATE OF PROCEDURE:  03/10/2012                              OPERATIVE REPORT  PREOPERATIVE DIAGNOSIS:  Stage IV rectal cancer.  POSTOPERATIVE DIAGNOSIS:  Stage IV rectal cancer.  Need of IV access.  PROCEDURE:  Left subclavian PowerPort insertion.  SURGEON:  Sandria Bales. Ezzard Standing, M.D.  FIRST ASSISTANT:  None.  ANESTHESIA:  IV sedation.  COMPLICATIONS:  None.  INDICATION FOR PROCEDURE:  [name] [name] is a 61 year old African American male who sees Dr. Darryll Capers as his family medical doctor.  He was found to have a rectal cancer and underwent neoadjuvant chemoradiation, supervised by Dr. Mancel Bale and Margaretmary Dys.  He then underwent a low anterior resection with diverting ileostomy by Dr. Ovidio Kin on February 06, 2012.  He has recovered from this surgery, now is anticipating Post-operative chemotherapy by Dr. Mancel Bale.  The indications and potential complications of Port-A-Cath placement were explained to the patient.  Potential complications include, but are not limited to, bleeding, infection, pneumothorax, venous thrombosis, and failure of the Port-A-Cath.  OPERATIVE NOTE:  The patient was taken to room number 17 at [facility], underwent IV sedation, supervised by Dr. Hart Robinsons. He was given 1 g of Ancef at the initial of procedure.    A time-out was held and the surgical checklist run.  The patient then had his upper chest prepped with ChloraPrep, had a roll under his back.  He was sterilely draped.  I accessed the left subclavian vein with a 16-gauge needle and threaded a guidewire into the left subclavian vein into the superior vena cava and checked this with fluoroscopy.  I then developed a pocket in the upper inner aspect of the left  the chest and sewed the PowerPort in with a 3-0 Vicryl suture.  The Silastic Tubing was passed from the reservoir site to the left subclavian stick site, inserted into the subclavian vein with an 8-French introducer.  I tried to position the tip at the junction of the superior vena cava right atrium under fluoroscopy.  Entire unit was flushed with dilute heparin-saline of 10 units per mL and aspirated easily.  The Silastic Tubing was then attached to the reservoir with the Bayonet device.  The port was sewn in place with 3-0 Vicryl suture.  The position of the port and tubing were checked with fluoroscopy.  I flushed the port with 4 mL of concentrated heparin, 100 units/mL.  The incision was then closed with interrupted 3-0 Vicryl sutures, the skin with a 5-0 Monocryl suture, painted with tincture of benzoin and Steri-Strips.  The patient tolerated the procedure well, was transported to recovery room in good condition.  Sponge and needle counts were correct at the end of the case.  He will go home today.   Sandria Bales. Ezzard Standing, M.D., FACS   DHN/MEDQ  D:  03/10/2012  T:  03/10/2012  Job:  [id number]  cc:   Ladene Artist, M.D. Lindaann Slough, M.D. Oneita Hurt,  M.D. Vania Rea. Jarold Motto, MD, Caleen Essex, FAGA Stacie Glaze, MD

## 2012-03-10 NOTE — Transfer of Care (Signed)
Immediate Anesthesia Transfer of Care Note  Patient: [name] [name]  Procedure(s) Performed: Procedure(s) (LRB): INSERTION PORT-A-CATH (N/A)  Patient Location: PACU  Anesthesia Type: MAC  Level of Consciousness: awake, alert , oriented and patient cooperative  Airway & Oxygen Therapy: Patient Spontanous Breathing, Patient connected to nasal cannula oxygen and awaiting RN for five minutes  Post-op Assessment: Report given to PACU RN, Post -op Vital signs reviewed and stable and Patient moving all extremities X 4  Post vital signs: Reviewed and stable  Complications: No apparent anesthesia complications

## 2012-03-10 NOTE — Anesthesia Preprocedure Evaluation (Addendum)
Anesthesia Evaluation  Patient identified by MRN, date of birth, ID band Patient awake    Reviewed: Allergy & Precautions, H&P , NPO status , Patient's Chart, lab work & pertinent test results, reviewed documented beta blocker date and time   Airway Mallampati: II TM Distance: >3 FB Neck ROM: full    Dental  (+) Teeth Intact   Pulmonary shortness of breath and with exertion, sleep apnea , COPDRecent URI , Resolved,          Cardiovascular hypertension, Pt. on medications + Valvular Problems/Murmurs     Neuro/Psych negative neurological ROS  negative psych ROS   GI/Hepatic negative GI ROS, Neg liver ROS,   Endo/Other  negative endocrine ROS  Renal/GU negative Renal ROS  negative genitourinary   Musculoskeletal   Abdominal   Peds  Hematology negative hematology ROS (+)   Anesthesia Other Findings See surgeon's H&P   Reproductive/Obstetrics negative OB ROS                         Anesthesia Physical Anesthesia Plan  ASA: III  Anesthesia Plan: MAC   Post-op Pain Management:    Induction: Intravenous  Airway Management Planned: LMA  Additional Equipment:   Intra-op Plan:   Post-operative Plan: Extubation in OR  Informed Consent: I have reviewed the patients History and Physical, chart, labs and discussed the procedure including the risks, benefits and alternatives for the proposed anesthesia with the patient or authorized representative who has indicated his/her understanding and acceptance.     Plan Discussed with: CRNA and Surgeon  Anesthesia Plan Comments:        Anesthesia Quick Evaluation

## 2012-03-10 NOTE — Interval H&P Note (Signed)
History and Physical Interval Note:  03/10/2012 9:46 AM  [name] [name]  has presented today for surgery, with the diagnosis of rectal cancer  The various methods of treatment have been discussed with the patient and family. Patient's wife is here today.  His Creatinine is elevated somewhat.  He says he is taking po's well - question mild dehydration from ileostomy.  But he looks okay to go ahead with power port.  After consideration of risks, benefits and other options for treatment, the patient has consented to  Procedure(s) (LRB): INSERTION PORT-A-CATH (N/A) as a surgical intervention .    The patients' history has been reviewed, patient examined, no change in status, stable for surgery.  I have reviewed the patients' chart and labs.  Questions were answered to the patient's satisfaction.     Orson Rho H

## 2012-03-10 NOTE — Anesthesia Postprocedure Evaluation (Signed)
Anesthesia Post Note  Patient: [name] [name]  Procedure(s) Performed: Procedure(s) (LRB): INSERTION PORT-A-CATH (N/A)  Anesthesia type: MAC  Patient location: PACU  Post pain: Pain level controlled  Post assessment: Patient's Cardiovascular Status Stable  Last Vitals:  Filed Vitals:   03/10/12 1100  BP: 124/71  Pulse: 80  Temp:   Resp: 17    Post vital signs: Reviewed and stable  Level of consciousness: alert  Complications: No apparent anesthesia complications

## 2012-03-10 NOTE — Progress Notes (Signed)
Report given to angel rn as caregiver 

## 2012-03-10 NOTE — Preoperative (Signed)
Beta Blockers   Reason not to administer Beta Blockers:Not Applicable 

## 2012-03-10 NOTE — Progress Notes (Signed)
Pt. States he discontinued Ziac and norvasc medication a week ago due to his blood pressure being low (117/75) and feeling light headed. He has not notified his medical doctor. Notified Dr.Fredrick. No new orders.

## 2012-03-10 NOTE — Discharge Instructions (Signed)
[facility] - DISCHARGE INSTRUCTIONS TO PATIENT  Return to work on:  NA  Activity:  Driving - may drive tomorrow, if doing well.   Lifting - No limit.  Wound Care:   Leave bandage x 2 days, then may remove and shower.  Diet:  As tolerated.  Drink lots of liquids.  Follow up appointment:  Call Dr. Allene Pyo office [facility]) at [phone] for an appointment in 4 weeks.  Medications and dosages:  Resume your home medications.  You have a prescription for:  Percocet.  Call Dr. Ezzard Standing or his office  [phone]) if you have:  Temperature greater than 100.4,  Persistent nausea and vomiting,  Severe uncontrolled pain,  Redness, tenderness, or signs of infection (pain, swelling, redness, odor or green/yellow discharge around the site),  Difficulty breathing, headache or visual disturbances,  Any other questions or concerns you may have after discharge.  In an emergency, call 911 or go to an Emergency Department at a nearby hospital.   Instructions Following General Anesthetic, Adult A nurse specialized in giving anesthesia (anesthetist) or a doctor specialized in giving anesthesia (anesthesiologist) gave you a medicine that made you sleep while a procedure was performed. For as long as 24 hours following this procedure, you may feel:  Dizzy.   Weak.   Drowsy.  AFTER THE PROCEDURE After surgery, you will be taken to the recovery area where a nurse will monitor your progress. You will be allowed to go home when you are awake, stable, taking fluids well, and without complications. For the first 24 hours following an anesthetic:  Have a responsible person with you.   Do not drive a car. If you are alone, do not take public transportation.   Do not drink alcohol.   Do not take medicine that has not been prescribed by your caregiver.   Do not sign important papers or make important decisions.   You may resume normal diet and activities as directed.     Change bandages (dressings) as directed.   Only take over-the-counter or prescription medicines for pain, discomfort, or fever as directed by your caregiver.  If you have questions or problems that seem related to the anesthetic, call the hospital and ask for the anesthetist or anesthesiologist on call. SEEK IMMEDIATE MEDICAL CARE IF:   You develop a rash.   You have difficulty breathing.   You have chest pain.   You develop any allergic problems.  Document Released: 03/02/2001 Document Revised: 11/13/2011 Document Reviewed: 10/11/2007 [facility] Patient Information 2012 [place], [place].

## 2012-03-10 NOTE — H&P (View-Only) (Signed)
ASSESSMENT AND PLAN: 1.  Rectal Cancer.  Path -  ypT3, ypN2, 4/10 nodes positive.   KRAS mutation detected.  Completed neoadjuvant therapy by Drs. Sherrill and [place] end of Nov/beginning of Dec 2012.  He had LAR - 02/06/2012.  Has diverting ileostomy.  He'll see me back in 6 weeks.  He has a follow up to see Dr. Truett Perna soon.  2.  Hepatic nodules.  ? Mets?  Needle aspiration of right lobe of liver by Dr. Jacqualine Code - 12/22/2011 - Benign.   But he had firm nodules in both lobes palpable at the time of surgery.   3.  Right hydroureter secondary to nephrolithiasis.  Dr. Brunilda Payor explored at surgery, but could not remove stone.  He still has a drain in his right flank.  [Creatinine up on pre op labs to 2.35 - 03/04/2012.  His on for power port 03/10/2012.  DN  03/07/2012]  4.  Hypertension. 5.  Smokes.  We talked about trying to quit before surgery.  Even cutting back would be good.   Chief Complaint  Patient presents with  . Routine Post Op    staple removal s/p colon resection 02/06/12   REFERRING PHYSICIAN:  Sheryn Bison, M.D.  HISTORY OF PRESENT ILLNESS: [name] [name] is a 61 y.o. (DOB: May 30, 1951)  AA male whose primary care physician is Carrie Mew, MD, MD and comes to me today for follow for surgery on a rectal cancer.  The patient has had some rectal bleeding for about one year.   He saw Dr. Jarold Motto who did a colonoscopy on him on 09/03/2011.  Dr. Jarold Motto saw in Mr. Mandelbaum's rectum, there was a 5 cm villous/necrotic tumor.  Biopsy of the rectal tumor read by Dr. [place] Rund showed invasive adenocarcinoma (Case No.: [id number].)  The patient also had a CT scan the same day.  The CT scan showed: 1. Large 4 cm rectal mass. 2. Left perirectal mass is concerning for local extension of the tumor. 3. Multiple hepatic hypodensities are concerning for hepatic metastasis. 4. Chronic right hydronephrosis secondary to a obstructing calculus in the mid right ureter.  He completed  neoadjuvant therapy by Drs. Sherrill and [place] end of Nov/beginning of Dec 2012.    He had his LAR 02/06/2012 and has a ileostomy.  He is doing well.  He still has a right LQ drain being managed by Dr. Brunilda Payor.  Past Medical History  Diagnosis Date  . Hypertension   . Abnormal EKG 11/01/11    REPORT IN EPIC-PT STATES NO KNOWN HEART PROBLEMS-NO CHEST PAINS  . GERD (gastroesophageal reflux disease)   . 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 Low blood potassium     POTASSIUM WAS 2.1 ON 11/01/11 VISIT TO ER--PT PUT ON ORAL POTASSIUM--MOST RECENT LAB 11/24/11 POTASSIUM IMPROVED TO 3.1  . Hydronephrosis of right kidney     "Chronic" -SECONDARY TO CALCULUS MID RT URETER AND ADDITIONAL CALCULI WITHIN RT KIDNEY--PT STATES HE HAS PASSED A STONE IN THE PAST--DID NOT KNOW HE HAD MORE STONES UNTIL CT ABD DONE ON 09/03/11  . History of radiation therapy 09/29/11-11/07/11    rectal ca  . Heart murmur     SINCE BIRTH--DOES NOT CAUSE ANY PROBLEMS  . Dyspnea     no recent bronchitis  . Rectal cancer     HAS COMPLETED RADIATION AND XELODA--PLAN RESTAGING TESTING.  [facility]  . Cancer     rectal adenocarcinoma stage [id number]  .  Nasal congestion   . COPD (chronic obstructive pulmonary disease)   . Sleep apnea     stop-bang score =6    Current Outpatient Prescriptions  Medication Sig Dispense Refill  . amLODipine (NORVASC) 5 MG tablet Take 5 mg by mouth daily after lunch daily after lunch.       . bisoprolol-hydrochlorothiazide (ZIAC) 5-6.25 MG per tablet Take 1 tablet by mouth daily after lunch daily after lunch.       . Fesoterodine Fumarate (TOVIAZ) 8 MG TB24 Take 8 mg by mouth daily as needed. overactive bladder      . oxyCODONE-acetaminophen (PERCOCET) 5-325 MG per tablet Take 1-2 tablets by mouth every 6 (six) hours as needed. Pain        No Known Allergies  REVIEW OF SYSTEMS: Cardiac:  Hypertension x 10 years. No history of heart disease.  No history of  prior cardiac catheterization.  No history of seeing a cardiologist. Pulmonary:  Smokes cigarettes - 1-1 1/2 PPD. No OSA/CPAP.  Gastrointestinal:  See HPI. Urologic:  History of kidney stones.  Sees Dr. Irving Burton. Musculoskeletal:  Bilateral shoulder pain.  Not seeing anyone for this.  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 No family history of colon cancer.  PHYSICAL EXAM: BP 114/70  Pulse 76  Temp(Src) 97.1 F (36.2 C) (Temporal)  Resp 12  Ht 5\' 10"  (1.778 m)  Wt 164 lb (74.39 kg)  BMI 23.53 kg/m2  General: WN BM. HEENT: Normal. Pupils equal. Normal dentition. Lungs: Clear and symmetric. Heart:  RRR. No murmur.  Abdomen: Ileostomy RLQ.  Bridge removed from under ileostomy.  Staples removed. Abdomen soft. Rectal:  Not examined Extremities:  Good strength in upper and lower extremities. Neurologic:  Grossly intact to motor and sensory function.  DATA REVIEWED: Path report - ypT3, ypN2 CEA - 12/01/2011 - 1.6  (was 13.8 from October 2012)  Ovidio Kin, M.D., [facility], [place] [phone]

## 2012-03-11 ENCOUNTER — Telehealth (INDEPENDENT_AMBULATORY_CARE_PROVIDER_SITE_OTHER): Payer: Self-pay | Admitting: General Surgery

## 2012-03-11 ENCOUNTER — Encounter (HOSPITAL_COMMUNITY): Payer: Self-pay | Admitting: Surgery

## 2012-03-11 NOTE — Telephone Encounter (Signed)
[name] [name], with [facility], calling Dr. Ezzard Standing to report the pt had stopped taking his Norvasc 5 mg and Bisoprolol/HCTZ 5/6.25 mg because of feeling dizzy and lightheaded.  His blood pressure is 108/58.

## 2012-03-12 ENCOUNTER — Other Ambulatory Visit: Payer: Self-pay | Admitting: *Deleted

## 2012-03-14 ENCOUNTER — Other Ambulatory Visit: Payer: Self-pay | Admitting: Oncology

## 2012-03-16 ENCOUNTER — Other Ambulatory Visit: Payer: 59

## 2012-03-16 ENCOUNTER — Other Ambulatory Visit: Payer: Self-pay | Admitting: *Deleted

## 2012-03-16 ENCOUNTER — Other Ambulatory Visit: Payer: Self-pay | Admitting: Oncology

## 2012-03-16 ENCOUNTER — Ambulatory Visit (HOSPITAL_BASED_OUTPATIENT_CLINIC_OR_DEPARTMENT_OTHER): Payer: 59

## 2012-03-16 VITALS — BP 124/83 | HR 103 | Temp 97.8°F

## 2012-03-16 DIAGNOSIS — C20 Malignant neoplasm of rectum: Secondary | ICD-10-CM

## 2012-03-16 DIAGNOSIS — Z5111 Encounter for antineoplastic chemotherapy: Secondary | ICD-10-CM

## 2012-03-16 LAB — BASIC METABOLIC PANEL
BUN: 31 mg/dL — ABNORMAL HIGH (ref 6–23)
CO2: 18 mEq/L — ABNORMAL LOW (ref 19–32)
Calcium: 9.3 mg/dL (ref 8.4–10.5)
Creatinine, Ser: 1.98 mg/dL — ABNORMAL HIGH (ref 0.50–1.35)

## 2012-03-16 MED ORDER — LIDOCAINE-PRILOCAINE 2.5-2.5 % EX CREA: TOPICAL_CREAM | CUTANEOUS | Status: DC | PRN

## 2012-03-16 MED ORDER — PROCHLORPERAZINE MALEATE 10 MG PO TABS: 10.0000 mg | ORAL_TABLET | Freq: Four times a day (QID) | ORAL | Status: DC | PRN

## 2012-03-16 MED ORDER — LEUCOVORIN CALCIUM INJECTION 350 MG
397.0000 mg/m2 | Freq: Once | INTRAMUSCULAR | Status: AC
  Administered 2012-03-16: 750 mg via INTRAVENOUS
  Filled 2012-03-16: qty 37.5

## 2012-03-16 MED ORDER — OXALIPLATIN CHEMO INJECTION 100 MG/20ML
65.0000 mg/m2 | Freq: Once | INTRAVENOUS | Status: AC
  Administered 2012-03-16: 125 mg via INTRAVENOUS
  Filled 2012-03-16: qty 25

## 2012-03-16 MED ORDER — ONDANSETRON 8 MG/50ML IVPB (CHCC)
8.0000 mg | Freq: Once | INTRAVENOUS | Status: AC
  Administered 2012-03-16: 8 mg via INTRAVENOUS

## 2012-03-16 MED ORDER — DEXTROSE 5 % IV SOLN
Freq: Once | INTRAVENOUS | Status: AC
  Administered 2012-03-16: 12:00:00 via INTRAVENOUS

## 2012-03-16 MED ORDER — FLUOROURACIL CHEMO INJECTION 2.5 GM/50ML
400.0000 mg/m2 | Freq: Once | INTRAVENOUS | Status: AC
  Administered 2012-03-16: 750 mg via INTRAVENOUS
  Filled 2012-03-16: qty 15

## 2012-03-16 MED ORDER — DEXAMETHASONE SODIUM PHOSPHATE 10 MG/ML IJ SOLN
10.0000 mg | Freq: Once | INTRAMUSCULAR | Status: AC
  Administered 2012-03-16: 10 mg via INTRAVENOUS

## 2012-03-16 MED ORDER — SODIUM CHLORIDE 0.9 % IV SOLN
2400.0000 mg/m2 | INTRAVENOUS | Status: DC
  Administered 2012-03-16: 4550 mg via INTRAVENOUS
  Filled 2012-03-16: qty 91

## 2012-03-16 MED ORDER — DEXTROSE 5 % IV SOLN
85.0000 mg/m2 | Freq: Once | INTRAVENOUS | Status: DC
  Filled 2012-03-16: qty 32

## 2012-03-16 NOTE — Progress Notes (Signed)
Patient reports he had ultrasound of kidneys done per Dr. Brunilda Payor last week and nurse told him "everything was OK". Cmet results today pending--oxaliplatin pending these results.

## 2012-03-16 NOTE — Patient Instructions (Signed)
[facility] Discharge Instructions for Patients Receiving Chemotherapy  Today you received the following chemotherapy agents : Oxaliplatin, 5FU, Leucovorin  To help prevent nausea and vomiting after your treatment, we encourage you to take your nausea medication:      Compazine 10 mg by mouth every 6 hours as needed Begin taking it at first sign of nausea and take it as often as prescribed.   If you develop nausea and vomiting that is not controlled by your nausea medication, call the clinic. If it is after clinic hours your family physician or the after hours number for the clinic or go to the Emergency Department.  Your EMLA (numbing) cream has been called in to your pharmacy with the anti-nausea med.   BELOW ARE SYMPTOMS THAT SHOULD BE REPORTED IMMEDIATELY:  *FEVER GREATER THAN 100.5 F  *CHILLS WITH OR WITHOUT FEVER  NAUSEA AND VOMITING THAT IS NOT CONTROLLED WITH YOUR NAUSEA MEDICATION  *UNUSUAL SHORTNESS OF BREATH  *UNUSUAL BRUISING OR BLEEDING  TENDERNESS IN MOUTH AND THROAT WITH OR WITHOUT PRESENCE OF ULCERS  *URINARY PROBLEMS  *BOWEL PROBLEMS  UNUSUAL RASH Items with * indicate a potential emergency and should be followed up as soon as possible.  One of the nurses will contact you 24 hours after your treatment. Please let the nurse know about any problems that you may have experienced. Feel free to call the clinic you have any questions or concerns. The clinic phone number is [phone].   I have been informed and understand all the instructions given to me. I know to contact the clinic, my physician, or go to the Emergency Department if any problems should occur. I do not have any questions at this time, but understand that I may call the clinic during office hours   should I have any questions or need assistance in obtaining follow up care.    __________________________________________  _____________  __________ Signature of Patient or Authorized  Representative            Date                   Time    __________________________________________ Nurse's Signature

## 2012-03-17 ENCOUNTER — Telehealth: Payer: Self-pay | Admitting: *Deleted

## 2012-03-17 NOTE — Telephone Encounter (Signed)
Left VM for patient to call back with status report on his condition and any questions he may have.

## 2012-03-18 ENCOUNTER — Ambulatory Visit (HOSPITAL_BASED_OUTPATIENT_CLINIC_OR_DEPARTMENT_OTHER): Payer: 59

## 2012-03-18 VITALS — BP 111/76 | HR 105 | Temp 97.2°F

## 2012-03-18 DIAGNOSIS — C20 Malignant neoplasm of rectum: Secondary | ICD-10-CM

## 2012-03-18 DIAGNOSIS — Z452 Encounter for adjustment and management of vascular access device: Secondary | ICD-10-CM

## 2012-03-18 MED ORDER — HEPARIN SOD (PORK) LOCK FLUSH 100 UNIT/ML IV SOLN
500.0000 [IU] | Freq: Once | INTRAVENOUS | Status: AC | PRN
  Administered 2012-03-18: 500 [IU]
  Filled 2012-03-18: qty 5

## 2012-03-18 MED ORDER — SODIUM CHLORIDE 0.9 % IJ SOLN
10.0000 mL | INTRAMUSCULAR | Status: DC | PRN
  Administered 2012-03-18: 10 mL
  Filled 2012-03-18: qty 10

## 2012-03-26 ENCOUNTER — Other Ambulatory Visit: Payer: Self-pay | Admitting: *Deleted

## 2012-03-26 DIAGNOSIS — C20 Malignant neoplasm of rectum: Secondary | ICD-10-CM

## 2012-03-28 ENCOUNTER — Other Ambulatory Visit: Payer: Self-pay | Admitting: Oncology

## 2012-03-29 ENCOUNTER — Ambulatory Visit (HOSPITAL_BASED_OUTPATIENT_CLINIC_OR_DEPARTMENT_OTHER): Payer: 59

## 2012-03-29 ENCOUNTER — Telehealth: Payer: Self-pay | Admitting: Oncology

## 2012-03-29 ENCOUNTER — Other Ambulatory Visit (HOSPITAL_BASED_OUTPATIENT_CLINIC_OR_DEPARTMENT_OTHER): Payer: 59 | Admitting: Lab

## 2012-03-29 ENCOUNTER — Ambulatory Visit (HOSPITAL_BASED_OUTPATIENT_CLINIC_OR_DEPARTMENT_OTHER): Payer: 59 | Admitting: Nurse Practitioner

## 2012-03-29 VITALS — BP 112/66 | HR 107 | Temp 97.9°F | Ht 70.0 in | Wt 156.6 lb

## 2012-03-29 DIAGNOSIS — C787 Secondary malignant neoplasm of liver and intrahepatic bile duct: Secondary | ICD-10-CM

## 2012-03-29 DIAGNOSIS — C20 Malignant neoplasm of rectum: Secondary | ICD-10-CM

## 2012-03-29 DIAGNOSIS — Z5111 Encounter for antineoplastic chemotherapy: Secondary | ICD-10-CM

## 2012-03-29 DIAGNOSIS — N201 Calculus of ureter: Secondary | ICD-10-CM

## 2012-03-29 DIAGNOSIS — N1330 Unspecified hydronephrosis: Secondary | ICD-10-CM

## 2012-03-29 LAB — COMPREHENSIVE METABOLIC PANEL
ALT: 11 U/L (ref 0–53)
AST: 19 U/L (ref 0–37)
Albumin: 3.9 g/dL (ref 3.5–5.2)
Alkaline Phosphatase: 100 U/L (ref 39–117)
Glucose, Bld: 99 mg/dL (ref 70–99)
Potassium: 3.9 mEq/L (ref 3.5–5.3)
Sodium: 136 mEq/L (ref 135–145)
Total Bilirubin: 0.6 mg/dL (ref 0.3–1.2)
Total Protein: 7.3 g/dL (ref 6.0–8.3)

## 2012-03-29 LAB — CBC WITH DIFFERENTIAL/PLATELET
Basophils Absolute: 0 10*3/uL (ref 0.0–0.1)
EOS%: 1.1 % (ref 0.0–7.0)
HGB: 10.9 g/dL — ABNORMAL LOW (ref 13.0–17.1)
MCH: 35 pg — ABNORMAL HIGH (ref 27.2–33.4)
MONO%: 7 % (ref 0.0–14.0)
NEUT#: 2.6 10*3/uL (ref 1.5–6.5)
RBC: 3.11 10*6/uL — ABNORMAL LOW (ref 4.20–5.82)
RDW: 12.8 % (ref 11.0–14.6)
lymph#: 0.5 10*3/uL — ABNORMAL LOW (ref 0.9–3.3)
nRBC: 0 % (ref 0–0)

## 2012-03-29 MED ORDER — DEXAMETHASONE SODIUM PHOSPHATE 10 MG/ML IJ SOLN
10.0000 mg | Freq: Once | INTRAMUSCULAR | Status: AC
  Administered 2012-03-29: 10 mg via INTRAVENOUS

## 2012-03-29 MED ORDER — FLUOROURACIL CHEMO INJECTION 2.5 GM/50ML
400.0000 mg/m2 | Freq: Once | INTRAVENOUS | Status: AC
  Administered 2012-03-29: 750 mg via INTRAVENOUS
  Filled 2012-03-29: qty 15

## 2012-03-29 MED ORDER — SODIUM CHLORIDE 0.9 % IV SOLN
2400.0000 mg/m2 | INTRAVENOUS | Status: DC
  Administered 2012-03-29: 4550 mg via INTRAVENOUS
  Filled 2012-03-29: qty 91

## 2012-03-29 MED ORDER — DEXTROSE 5 % IV SOLN
Freq: Once | INTRAVENOUS | Status: AC
  Administered 2012-03-29: 12:00:00 via INTRAVENOUS

## 2012-03-29 MED ORDER — HEPARIN SOD (PORK) LOCK FLUSH 100 UNIT/ML IV SOLN
500.0000 [IU] | Freq: Once | INTRAVENOUS | Status: DC | PRN
  Filled 2012-03-29: qty 5

## 2012-03-29 MED ORDER — ONDANSETRON 8 MG/50ML IVPB (CHCC)
8.0000 mg | Freq: Once | INTRAVENOUS | Status: AC
  Administered 2012-03-29: 8 mg via INTRAVENOUS

## 2012-03-29 MED ORDER — OXALIPLATIN CHEMO INJECTION 100 MG/20ML
65.0000 mg/m2 | Freq: Once | INTRAVENOUS | Status: AC
  Administered 2012-03-29: 125 mg via INTRAVENOUS
  Filled 2012-03-29: qty 25

## 2012-03-29 MED ORDER — LEUCOVORIN CALCIUM INJECTION 350 MG
400.0000 mg/m2 | Freq: Once | INTRAVENOUS | Status: AC
  Administered 2012-03-29: 756 mg via INTRAVENOUS
  Filled 2012-03-29: qty 37.8

## 2012-03-29 MED ORDER — SODIUM CHLORIDE 0.9 % IJ SOLN
10.0000 mL | INTRAMUSCULAR | Status: DC | PRN
Start: 2012-03-29 — End: 2012-03-29
  Filled 2012-03-29: qty 10

## 2012-03-29 NOTE — Patient Instructions (Signed)
Patient aware of next appointment; discharged home with no complaints. 

## 2012-03-29 NOTE — Progress Notes (Signed)
OFFICE PROGRESS NOTE  Interval history:  [name] returns as scheduled. He completed cycle 1 of FOLFOX on 03/16/2012. He had nausea for 2 or 3 days after the chemotherapy. No vomiting Compazine relieved the nausea. He noted loose stools as well. He took Imodium as needed. He denies mouth sores. He resumed cold after 5-6 days. He denies persistent neuropathy symptoms.   Objective: Blood pressure 112/66, pulse 107, temperature 97.9 F (36.6 C), temperature source Oral, height 5\' 10"  (1.778 m), weight 156 lb 9.6 oz (71.033 kg).  Oropharynx is without thrush or ulceration. Lungs are clear. Regular cardiac rhythm. Port-A-Cath site is without erythema. Abdomen is soft and nontender. No organomegaly. Extremities without edema.  Lab Results: Lab Results  Component Value Date   WBC 3.3* 03/29/2012   HGB 10.9* 03/29/2012   HCT 31.6* 03/29/2012   MCV 101.8* 03/29/2012   PLT 187 03/29/2012    Chemistry:    Chemistry      Component Value Date/Time   NA 138 03/16/2012 1138   K 3.8 03/16/2012 1138   CL 111 03/16/2012 1138   CO2 18* 03/16/2012 1138   BUN 31* 03/16/2012 1138   CREATININE 1.98* 03/16/2012 1138      Component Value Date/Time   CALCIUM 9.3 03/16/2012 1138   ALKPHOS 114 03/09/2012 1011   AST 13 03/09/2012 1011   ALT 14 03/09/2012 1011   BILITOT 0.3 03/09/2012 1011       Studies/Results: Dg Chest 2 View  03/04/2012  *RADIOLOGY REPORT*  Clinical Data: Preop, hypertension  CHEST - 2 VIEW  Comparison: C t chest 09/03/2011  Findings: Cardiomediastinal silhouette is unremarkable.  No acute infiltrate or pleural effusion.  No pulmonary edema.  Mild degenerative changes thoracic spine.  IMPRESSION: No active disease.  Original Report Authenticated By: Natasha Mead, M.D.   Dg Chest Port 1 View  03/10/2012  *RADIOLOGY REPORT*  Clinical Data: Port-A-Cath insertion  PORTABLE CHEST - 1 VIEW  Comparison: 03/04/2012  Findings: Power port has been placed from a left subclavian approach.  The tip is at the SVC/RA  junction.  No pneumothorax. Lungs are clear.  IMPRESSION: Power port well positioned.  No apparent complication.  Original Report Authenticated By: Thomasenia Sales, M.D.    Medications: I have reviewed the patient's current medications.  Assessment/Plan:  1. Metastatic rectal cancer status post biopsy of rectal mass 09/03/2011 with pathology showing invasive adenocarcinoma. Staging CT scans 09/03/2011 showed a 10 mm hypodense rounded lesion in left lateral hepatic lobe, similar 6 mm lesion in the superior right hepatic lobe and a smaller subcapsular lesion in the lateral right hepatic lobe; rectal mass within the lumen of the bowel emanating from the left wall measuring 4.9 x 3.5 cm and a mass within the low left perirectal fat measuring 2.3 x 3.2 cm consistent with local extension of carcinoma. Staging PET scan 09/18/2011 showed a 5.1 cm distal sigmoid/rectal mass with max SUV 19.2 with local extension into the left perirectal fat with max SUV 7.7. At least 2 suspected hepatic metastases with max SUV 6.7 in the lateral segment left hepatic lobe and max SUV 4.1 in the lateral right hepatic dome. Additional tiny hypodensities in the liver may be cysts; patchy/nodular opacity in the left lower lobe with max SUV 3.5 worrisome for pulmonary metastasis, less likely infectious. He began radiation and concurrent Xeloda chemotherapy on 09/29/2011. He completed radiation on 11/06/2011. MRI of the liver on 12/08/2011 showed 2 hypermetabolic liver lesions, similar in size to 09/03/2011. 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 Equivocal 7 mm lesion in the inferior right hepatic lobe. No evidence of extrahepatic metastasis within the abdomen. An ultrasound-guided biopsy of a small left hepatic lesion on 12/23/2011 was nondiagnostic. He underwent a low anterior resection with loop ileostomy on 02/06/2012 with final pathology showing a 3.5 cm invasive  adenocarcinoma with abundant extracellular mucin invading through the muscularis propria into pericolonic fatty tissue; 4 of 10 pericolonic lymph nodes were positive for metastatic carcinoma; resection margins were clear (ypT3,ypN2). He began FOLFOX chemotherapy on 03/16/2012. 2. K-ras mutation detected. 3. Rectal pain and bleeding secondary to #1. Improved. 4. Frequent loose stools secondary to #1. Improved. 5. Hypertension. 6. Tobacco use. 7. CT scan 09/03/2011 with chronic right hydronephrosis secondary to an obstructing calculus in the mid right ureter. He is followed by Dr. Brunilda Payor.  8. Status post cystoscopy, right retrograde pyelogram, ureteroscopy and insertion of right double-J stent 11/28/2011. The double-J stent was replaced 02/06/2012. [name] reports the stent was removed last week. 9. Elevated creatinine. We are repeating today.  10. Hypokalemia-resolved. 11. Status post ultrasound-guided fine-needle aspiration of small left hepatic lesion 12/22/2011. The pathology revealed no malignancy.  Disposition-[name] appears stable. Plan to proceed with cycle 2 FOLFOX today as scheduled. He will return for a followup visit and cycle 3 in 2 weeks. He will contact the office in the interim with any problems.  Plan reviewed with Dr. Truett Perna.  Lonna Cobb ANP/GNP-BC

## 2012-03-29 NOTE — Telephone Encounter (Signed)
appt made and printed for pt,mw to add tx and pt is aware   aom

## 2012-03-31 ENCOUNTER — Ambulatory Visit (HOSPITAL_BASED_OUTPATIENT_CLINIC_OR_DEPARTMENT_OTHER): Payer: 59

## 2012-03-31 ENCOUNTER — Other Ambulatory Visit: Payer: Self-pay | Admitting: Oncology

## 2012-03-31 ENCOUNTER — Encounter (INDEPENDENT_AMBULATORY_CARE_PROVIDER_SITE_OTHER): Payer: Self-pay

## 2012-03-31 VITALS — BP 114/76 | HR 116 | Temp 97.7°F

## 2012-03-31 DIAGNOSIS — N19 Unspecified kidney failure: Secondary | ICD-10-CM

## 2012-03-31 DIAGNOSIS — C20 Malignant neoplasm of rectum: Secondary | ICD-10-CM

## 2012-03-31 DIAGNOSIS — Z452 Encounter for adjustment and management of vascular access device: Secondary | ICD-10-CM

## 2012-03-31 MED ORDER — HEPARIN SOD (PORK) LOCK FLUSH 100 UNIT/ML IV SOLN
500.0000 [IU] | Freq: Once | INTRAVENOUS | Status: AC | PRN
  Administered 2012-03-31: 500 [IU]
  Filled 2012-03-31: qty 5

## 2012-03-31 MED ORDER — SODIUM CHLORIDE 0.9 % IJ SOLN
10.0000 mL | INTRAMUSCULAR | Status: DC | PRN
  Administered 2012-03-31: 10 mL
  Filled 2012-03-31: qty 10

## 2012-03-31 NOTE — Progress Notes (Signed)
03/31/12 Pump dc.  Patient experienced diarrhea but Imodium has helped resolved the diarrhea

## 2012-03-31 NOTE — Patient Instructions (Signed)
Call MD for problems 

## 2012-04-01 ENCOUNTER — Telehealth: Payer: Self-pay | Admitting: Oncology

## 2012-04-01 ENCOUNTER — Encounter (INDEPENDENT_AMBULATORY_CARE_PROVIDER_SITE_OTHER): Payer: Self-pay | Admitting: Surgery

## 2012-04-01 ENCOUNTER — Ambulatory Visit (INDEPENDENT_AMBULATORY_CARE_PROVIDER_SITE_OTHER): Payer: 59 | Admitting: Surgery

## 2012-04-01 VITALS — BP 125/75 | HR 113 | Temp 97.1°F | Ht 70.0 in | Wt 153.2 lb

## 2012-04-01 DIAGNOSIS — C20 Malignant neoplasm of rectum: Secondary | ICD-10-CM

## 2012-04-01 NOTE — Telephone Encounter (Signed)
received a called back from [place] from [facility] instructing me to fill out referral and fax to there office. Gc info to medical recs

## 2012-04-01 NOTE — Telephone Encounter (Signed)
called [facility] and for all new pt appts had to leave pts information on vm and they will rtn call to me to schedule appt

## 2012-04-01 NOTE — Progress Notes (Signed)
Patient:  [name] [name] MRN:  [id number] DOB:  1951-01-05   ASSESSMENT AND PLAN: 1.  Rectal Cancer.  Path -  ypT3, ypN2, 4/10 nodes positive.   KRAS mutation detected.  Completed neoadjuvant therapy by Drs. Sherrill and [place] end of Nov/beginning of Dec 2012.  He had LAR - 02/06/2012.  Has diverting ileostomy.  CEA - 8.4 on 03/09/2012  He plans 4 courses of chemo ... Then repeat his imaging studies.  He'll see me back in 8 weeks.  Depending on his chemo treatment and how he is dong, we could talk about reversing his ileostomy.  2.  Hepatic nodules.  ? Mets?  Needle aspiration of right lobe of liver by Dr. Jacqualine Code - 12/22/2011 - Benign.   But he had firm nodules in both lobes of liver palpable at the time of surgery.   3.  Right hydroureter secondary to nephrolithiasis.  Dr. Brunilda Payor explored at surgery, but could not remove stone.  Creat - 03/29/2012 - 2.61.  3.5  Power port - 03/10/2012 - D. Tekoa Amon 4.  Hypertension. 5.  Smokes.  We talked about trying to quit before surgery.  He is trying to quit, but not making much progress. 6.  He has a runny nose - will try OTC Allegra   Chief Complaint  Patient presents with  . Follow-up    reck colon   REFERRING PHYSICIAN:  Sheryn Bison, M.D.  HISTORY OF PRESENT ILLNESS: [name] [name] is a 61 y.o. (DOB: May 13, 1951)  AA male whose primary care physician is Carrie Mew, MD, MD and comes to me today for follow for surgery on a rectal cancer.  The patient has had some rectal bleeding for about one year.   He saw Dr. Jarold Motto who did a colonoscopy on him on 09/03/2011.  Dr. Jarold Motto saw in Mr. Mocarski's rectum, there was a 5 cm villous/necrotic tumor.  Biopsy of the rectal tumor read by Dr. [place] Rund showed invasive adenocarcinoma (Case No.: [id number].)  The patient also had a CT scan the same day.  The CT scan showed: 1. Large 4 cm rectal mass. 2. Left perirectal mass is concerning for local extension of the tumor. 3. Multiple hepatic  hypodensities are concerning for hepatic metastasis. 4. Chronic right hydronephrosis secondary to a obstructing calculus in the mid right ureter.  He completed neoadjuvant therapy by Drs. Sherrill and [place] end of Nov/beginning of Dec 2012.  He had his LAR 02/06/2012 and has a ileostomy.    He is doing well.  He doesn't have much appetite after chemo, but he gets it back in a couple of days.  He had no specific concerns.  Past Medical History  Diagnosis Date  . Hypertension   . Abnormal EKG 11/01/11    REPORT IN EPIC-PT STATES NO KNOWN HEART PROBLEMS-NO CHEST PAINS  . 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 Low blood potassium     POTASSIUM WAS 2.1 ON 11/01/11 VISIT TO ER--PT PUT ON ORAL POTASSIUM--MOST RECENT LAB 11/24/11 POTASSIUM IMPROVED TO 3.1  . History of radiation therapy 09/29/11-11/07/11    rectal ca  . Nasal congestion   . COPD (chronic obstructive pulmonary disease)   . Heart murmur     SINCE BIRTH--DOES NOT CAUSE ANY PROBLEMS  . Dyspnea     no recent bronchitis  . Recurrent upper respiratory infection (URI)     HEAD COLD   . Sleep apnea     stop-bang score =6 (NO SLEEP STUDY DONE YET)  .  No pertinent past medical history     2 SPOTS ON LIVER JUST WATCHING  . Hydronephrosis of right kidney     "Chronic" -SECONDARY TO CALCULUS (STENT RT URETER) MID RT URETER AND ADDITIONAL CALCULI WITHIN RT KIDNEY--PT STATES HE HAS PASSED A STONE IN THE PAST--DID NOT KNOW HE HAD MORE STONES UNTIL CT ABD DONE ON 09/03/11  . Rectal cancer     HAS COMPLETED RADIATION AND XELODA--PLAN RESTAGING TESTING.  [facility]  . Cancer     rectal adenocarcinoma stage T3NxMx    Current Outpatient Prescriptions  Medication Sig Dispense Refill  . Chlorpheniramine-DM (CORICIDIN HBP COUGH/COLD PO) Take 1 tablet by mouth 2 (two) times daily between meals as needed. For cold symptoms      . lidocaine-prilocaine (EMLA) cream Apply topically as needed. Apply to [facility]  site 1 hour prior to stick and cover with plastic wrap  30 g  prn  . oxyCODONE-acetaminophen (PERCOCET) 5-325 MG per tablet Take 1-2 tablets by mouth every 6 (six) hours as needed. Pain       . prochlorperazine (COMPAZINE) 10 MG tablet Take 1 tablet (10 mg total) by mouth every 6 (six) hours as needed (nausea).  60 tablet  2   No current facility-administered medications for this visit.   Facility-Administered Medications Ordered in Other Visits  Medication Dose Route Frequency Provider Last Rate Last Dose  . DISCONTD: sodium chloride 0.9 % injection 10 mL  10 mL Intracatheter PRN Ladene Artist, MD   10 mL at 03/31/12 1208    No Known Allergies  REVIEW OF SYSTEMS: Cardiac:  Hypertension x 10 years. No history of heart disease.  No history of prior cardiac catheterization.  No history of seeing a cardiologist. Pulmonary:  Smokes cigarettes - 1-1 1/2 PPD. No OSA/CPAP.  Gastrointestinal:  See HPI. Urologic:  History of kidney stones.  Sees Dr. Irving Burton. Musculoskeletal:  Bilateral shoulder pain.  Not seeing anyone for this.  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 No family history of colon cancer.  PHYSICAL EXAM: BP 125/75  Pulse 113  Temp(Src) 97.1 F (36.2 C) (Temporal)  Ht 5\' 10"  (1.778 m)  Wt 153 lb 3.2 oz (69.491 kg)  BMI 21.98 kg/m2  SpO2 93%  General: WN BM. HEENT: Normal. Pupils equal. Normal dentition. Chest:  Port in left upper chest. Abdomen: Ileostomy RLQ. Doing okay.  Abdomen soft.  DATA REVIEWED: Creat - 03/29/2012 - 2.61 CEA - 8.4 on 03/09/2012  [name] [name], M.D., [facility], [place] [phone]

## 2012-04-03 ENCOUNTER — Other Ambulatory Visit: Payer: Self-pay | Admitting: Internal Medicine

## 2012-04-06 ENCOUNTER — Telehealth: Payer: Self-pay | Admitting: *Deleted

## 2012-04-06 NOTE — Telephone Encounter (Signed)
Scheduled to see Dr. Casimiro Needle on 04/21/12 at 2:30pm. Patient has been notified.

## 2012-04-11 ENCOUNTER — Other Ambulatory Visit: Payer: Self-pay | Admitting: Oncology

## 2012-04-12 ENCOUNTER — Other Ambulatory Visit (HOSPITAL_BASED_OUTPATIENT_CLINIC_OR_DEPARTMENT_OTHER): Payer: 59 | Admitting: Lab

## 2012-04-12 ENCOUNTER — Ambulatory Visit (HOSPITAL_BASED_OUTPATIENT_CLINIC_OR_DEPARTMENT_OTHER): Payer: 59

## 2012-04-12 ENCOUNTER — Ambulatory Visit (HOSPITAL_BASED_OUTPATIENT_CLINIC_OR_DEPARTMENT_OTHER): Payer: 59 | Admitting: Oncology

## 2012-04-12 VITALS — BP 109/73 | HR 119 | Temp 97.2°F | Ht 70.0 in | Wt 149.5 lb

## 2012-04-12 DIAGNOSIS — C20 Malignant neoplasm of rectum: Secondary | ICD-10-CM

## 2012-04-12 DIAGNOSIS — E876 Hypokalemia: Secondary | ICD-10-CM

## 2012-04-12 DIAGNOSIS — Z932 Ileostomy status: Secondary | ICD-10-CM

## 2012-04-12 DIAGNOSIS — C787 Secondary malignant neoplasm of liver and intrahepatic bile duct: Secondary | ICD-10-CM

## 2012-04-12 DIAGNOSIS — R197 Diarrhea, unspecified: Secondary | ICD-10-CM

## 2012-04-12 LAB — CBC WITH DIFFERENTIAL/PLATELET
BASO%: 0.7 % (ref 0.0–2.0)
EOS%: 0.7 % (ref 0.0–7.0)
Eosinophils Absolute: 0 10*3/uL (ref 0.0–0.5)
LYMPH%: 28.3 % (ref 14.0–49.0)
MCH: 35.9 pg — ABNORMAL HIGH (ref 27.2–33.4)
MCHC: 35.3 g/dL (ref 32.0–36.0)
MCV: 101.9 fL — ABNORMAL HIGH (ref 79.3–98.0)
MONO%: 11.3 % (ref 0.0–14.0)
NEUT#: 1.9 10*3/uL (ref 1.5–6.5)
Platelets: 181 10*3/uL (ref 140–400)
RBC: 3.3 10*6/uL — ABNORMAL LOW (ref 4.20–5.82)
RDW: 13.8 % (ref 11.0–14.6)
nRBC: 1 % — ABNORMAL HIGH (ref 0–0)

## 2012-04-12 LAB — COMPREHENSIVE METABOLIC PANEL
ALT: 10 U/L (ref 0–53)
Alkaline Phosphatase: 99 U/L (ref 39–117)
CO2: 16 mEq/L — ABNORMAL LOW (ref 19–32)
Creatinine, Ser: 4.08 mg/dL — ABNORMAL HIGH (ref 0.50–1.35)
Sodium: 137 mEq/L (ref 135–145)
Total Bilirubin: 0.5 mg/dL (ref 0.3–1.2)
Total Protein: 7.9 g/dL (ref 6.0–8.3)

## 2012-04-12 MED ORDER — SODIUM CHLORIDE 0.9 % IV SOLN
Freq: Once | INTRAVENOUS | Status: AC
  Administered 2012-04-12: 12:00:00 via INTRAVENOUS

## 2012-04-12 MED ORDER — SODIUM CHLORIDE 0.9 % IJ SOLN
10.0000 mL | INTRAMUSCULAR | Status: DC | PRN
  Administered 2012-04-12: 10 mL via INTRAVENOUS
  Filled 2012-04-12: qty 10

## 2012-04-12 MED ORDER — HEPARIN SOD (PORK) LOCK FLUSH 100 UNIT/ML IV SOLN
500.0000 [IU] | Freq: Once | INTRAVENOUS | Status: AC
Start: 2012-04-12 — End: 2012-04-12
  Administered 2012-04-12: 500 [IU] via INTRAVENOUS
  Filled 2012-04-12: qty 5

## 2012-04-12 NOTE — Progress Notes (Signed)
OFFICE PROGRESS NOTE   INTERVAL HISTORY:    He returns as scheduled. He completed another cycle of FOLFOX on 03/29/2012. He reports an episode of nausea/vomiting on day for 4 days 5. This has not recurred. He developed "chapped "lips"and tongue. He complains of malaise and anorexia. He empties the ostomy bag 5-6 times per day. The stool is loose. No neuropathy symptoms today. He has not seen nephrology.  Objective:  Vital signs in last 24 hours:  Blood pressure 109/73, pulse 119, temperature 97.2 F (36.2 C), temperature source Oral, height 5\' 10"  (1.778 m), weight 149 lb 8 oz (67.813 kg).    HEENT: The mucous membranes are moist, no thrush or ulcers Lymphatics: No cervical, supraclavicular, or axillary nodes Resp: Lungs clear bilaterally Cardio: Regular rate and rhythm GI: The abdomen is nontender, no hepatomegaly, no mass, yellow liquid stool in the ostomy bag Vascular: No leg edema  Portacath/PICC-without erythema  Lab Results:  Lab Results  Component Value Date   WBC 3.2* 04/12/2012   HGB 11.9* 04/12/2012   HCT 33.6* 04/12/2012   MCV 101.9* 04/12/2012   PLT 181 04/12/2012   ANC 1.9  BUN 69, creatinine 4.08 BUN 47, creatinine 2.61 on 03/29/12  Medications: I have reviewed the patient's current medications.  Assessment/Plan: 1. Metastatic rectal cancer status post biopsy of rectal mass 09/03/2011 with pathology showing invasive adenocarcinoma. Staging CT scans 09/03/2011 showed a 10 mm hypodense rounded lesion in left lateral hepatic lobe, similar 6 mm lesion in the superior right hepatic lobe and a smaller subcapsular lesion in the lateral right hepatic lobe; rectal mass within the lumen of the bowel emanating from the left wall measuring 4.9 x 3.5 cm and a mass within the low left perirectal fat measuring 2.3 x 3.2 cm consistent with local extension of carcinoma. Staging PET scan 09/18/2011 showed a 5.1 cm distal sigmoid/rectal mass with max SUV 19.2 with local extension into the  left perirectal fat with max SUV 7.7. At least 2 suspected hepatic metastases with max SUV 6.7 in the lateral segment left hepatic lobe and max SUV 4.1 in the lateral right hepatic dome. Additional tiny hypodensities in the liver may be cysts; patchy/nodular opacity in the left lower lobe with max SUV 3.5 worrisome for pulmonary metastasis, less likely infectious. He began radiation and concurrent Xeloda chemotherapy on 09/29/2011. He completed radiation on 11/06/2011. MRI of the liver on 12/08/2011 showed 2 hypermetabolic liver lesions, similar in size to 09/03/2011. 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 Equivocal 7 mm lesion in the inferior right hepatic lobe. No evidence of extrahepatic metastasis within the abdomen. An ultrasound-guided biopsy of a small left hepatic lesion on 12/23/2011 was nondiagnostic. He underwent a low anterior resection with loop ileostomy on 02/06/2012 with final pathology showing a 3.5 cm invasive adenocarcinoma with abundant extracellular mucin invading through the muscularis propria into pericolonic fatty tissue; 4 of 10 pericolonic lymph nodes were positive for metastatic carcinoma; resection margins were clear (ypT3,ypN2). He began FOLFOX chemotherapy on 03/16/2012. 2. K-ras mutation detected. 3. Rectal pain and bleeding secondary to #1. Improved. 4. Frequent loose stools secondary to #1. Improved. 5. Hypertension. 6. Tobacco use. 7. CT scan 09/03/2011 with chronic right hydronephrosis secondary to an obstructing calculus in the mid right ureter. He is followed by Dr. Brunilda Payor.  8. Status post cystoscopy, right retrograde pyelogram, ureteroscopy and insertion of right double-J stent 11/28/2011. The double-J stent was replaced 02/06/2012. The stent has been removed. 9.  Elevated BUN and creatinine-? Secondary to dehydration with a high output ostomy versus intrinsic renal failure. 10. Hypokalemia-secondary to high  output ostomy 11. Status post ultrasound-guided fine-needle aspiration of small left hepatic lesion 12/22/2011. The pathology revealed no malignancy. 12. Malaise and weight loss   Disposition:  He has a declining performance status. He has completed 2 cycles of FOLFOX chemotherapy. I suspect his symptoms and the markedly elevated BUN/creatinine are related to a high output ileostomy. Chemotherapy will be held today. He will receive intravenous fluids today and again on 04/13/2012. We will request an urgent nephrology consult if the BUN/creatinine do not improve over the next few days. We will ask him to take an increased dose of Imodium.   Thornton Papas, MD  04/12/2012  10:23 AM

## 2012-04-12 NOTE — Patient Instructions (Signed)
[facility] Discharge Instructions for Patients   Today you received IV fluids. Your renal (kidney functions) are elevated: more than likely due to dehydration from your high output ostomy. You need to push fluids and be aggressive with your Imodium: Take #2 tabs up to 4 times daily to decrease output.  To help prevent nausea and vomiting after your treatment, we encourage you to take your nausea medication: Compazine every 6 hours as needed    If you develop nausea and vomiting that is not controlled by your nausea medication, call the clinic. If it is after clinic hours your family physician or the after hours number for the clinic or go to the Emergency Department.   BELOW ARE SYMPTOMS THAT SHOULD BE REPORTED IMMEDIATELY:  *FEVER GREATER THAN 100.5 F  *CHILLS WITH OR WITHOUT FEVER  NAUSEA AND VOMITING THAT IS NOT CONTROLLED WITH YOUR NAUSEA MEDICATION  *UNUSUAL SHORTNESS OF BREATH  *UNUSUAL BRUISING OR BLEEDING  TENDERNESS IN MOUTH AND THROAT WITH OR WITHOUT PRESENCE OF ULCERS  *URINARY PROBLEMS  *BOWEL PROBLEMS  UNUSUAL RASH Items with * indicate a potential emergency and should be followed up as soon as possible.    Feel free to call the clinic you have any questions or concerns. The clinic phone number is [phone].   I have been informed and understand all the instructions given to me. I know to contact the clinic, my physician, or go to the Emergency Department if any problems should occur. I do not have any questions at this time, but understand that I may call the clinic during office hours   should I have any questions or need assistance in obtaining follow up care.    __________________________________________  _____________  __________ Signature of Patient or Authorized Representative            Date                   Time    __________________________________________ Nurse's Signature

## 2012-04-12 NOTE — Progress Notes (Signed)
Addended by: Thornton Papas B on: 04/12/2012 11:16 AM   Modules accepted: Orders

## 2012-04-13 ENCOUNTER — Other Ambulatory Visit: Payer: Self-pay

## 2012-04-13 ENCOUNTER — Ambulatory Visit (HOSPITAL_BASED_OUTPATIENT_CLINIC_OR_DEPARTMENT_OTHER): Payer: 59

## 2012-04-13 ENCOUNTER — Telehealth: Payer: Self-pay | Admitting: Oncology

## 2012-04-13 ENCOUNTER — Encounter: Payer: 59 | Admitting: Nutrition

## 2012-04-13 ENCOUNTER — Ambulatory Visit (HOSPITAL_BASED_OUTPATIENT_CLINIC_OR_DEPARTMENT_OTHER): Payer: 59 | Admitting: Nurse Practitioner

## 2012-04-13 VITALS — BP 102/66 | HR 100 | Temp 97.0°F | Ht 70.0 in | Wt 152.7 lb

## 2012-04-13 DIAGNOSIS — Z932 Ileostomy status: Secondary | ICD-10-CM

## 2012-04-13 DIAGNOSIS — R197 Diarrhea, unspecified: Secondary | ICD-10-CM

## 2012-04-13 DIAGNOSIS — E876 Hypokalemia: Secondary | ICD-10-CM

## 2012-04-13 DIAGNOSIS — C20 Malignant neoplasm of rectum: Secondary | ICD-10-CM

## 2012-04-13 DIAGNOSIS — E860 Dehydration: Secondary | ICD-10-CM

## 2012-04-13 MED ORDER — HEPARIN (PORCINE) LOCK FLUSH 10 UNIT/ML IV SOLN
10.0000 [IU] | Freq: Once | INTRAVENOUS | Status: AC
  Administered 2012-04-13: 10 [IU] via INTRAVENOUS

## 2012-04-13 MED ORDER — SODIUM CHLORIDE 0.9 % IV SOLN
INTRAVENOUS | Status: DC
  Administered 2012-04-13: 10:00:00 via INTRAVENOUS
  Filled 2012-04-13: qty 1000

## 2012-04-13 MED ORDER — SODIUM CHLORIDE 0.9 % IV SOLN
Freq: Once | INTRAVENOUS | Status: AC
  Administered 2012-04-13: 12:00:00 via INTRAVENOUS

## 2012-04-13 MED ORDER — SODIUM CHLORIDE 0.9 % IJ SOLN
10.0000 mL | Freq: Once | INTRAMUSCULAR | Status: AC
  Administered 2012-04-13: 10 mL via INTRAVENOUS
  Filled 2012-04-13: qty 10

## 2012-04-13 NOTE — Telephone Encounter (Signed)
appts made and printed for pt aom °

## 2012-04-13 NOTE — Progress Notes (Signed)
OFFICE PROGRESS NOTE  Interval history:  [name] [name] returns as scheduled. He is feeling better since receiving the IV fluids yesterday. He continues to have increased output from the ileostomy. He estimates that he emptied the collection bag 3-4 times after leaving the office yesterday. Output continues to be watery. He took 2 Imodium last night. He has mild nausea. He denies vomiting.   Objective: Blood pressure 102/66, pulse 100, temperature 97 F (36.1 C), temperature source Oral, height 5\' 10"  (1.778 m), weight 152 lb 11.2 oz (69.264 kg).  Oropharynx is without thrush or ulceration. Mucous membranes are pink and moist. Lungs are clear. Regular cardiac rhythm. Port-A-Cath site without erythema. Abdomen is soft and nontender. Watery output in the ileostomy collection bag. Extremities are without edema. Skin turgor is normal.  Lab Results: Lab Results  Component Value Date   WBC 3.2* 04/12/2012   HGB 11.9* 04/12/2012   HCT 33.6* 04/12/2012   MCV 101.9* 04/12/2012   PLT 181 04/12/2012    Chemistry:    Chemistry      Component Value Date/Time   NA 137 04/12/2012 1002   K 3.4* 04/12/2012 1002   CL 103 04/12/2012 1002   CO2 16* 04/12/2012 1002   BUN 69* 04/12/2012 1002   CREATININE 4.08* 04/12/2012 1002      Component Value Date/Time   CALCIUM 8.9 04/12/2012 1002   ALKPHOS 99 04/12/2012 1002   AST 13 04/12/2012 1002   ALT 10 04/12/2012 1002   BILITOT 0.5 04/12/2012 1002       Studies/Results: No results found.  Medications: I have reviewed the patient's current medications.  Assessment/Plan:  1. Metastatic rectal cancer status post biopsy of rectal mass 09/03/2011 with pathology showing invasive adenocarcinoma. Staging CT scans 09/03/2011 showed a 10 mm hypodense rounded lesion in left lateral hepatic lobe, similar 6 mm lesion in the superior right hepatic lobe and a smaller subcapsular lesion in the lateral right hepatic lobe; rectal mass within the lumen of the bowel emanating from the left wall  measuring 4.9 x 3.5 cm and a mass within the low left perirectal fat measuring 2.3 x 3.2 cm consistent with local extension of carcinoma. Staging PET scan 09/18/2011 showed a 5.1 cm distal sigmoid/rectal mass with max SUV 19.2 with local extension into the left perirectal fat with max SUV 7.7. At least 2 suspected hepatic metastases with max SUV 6.7 in the lateral segment left hepatic lobe and max SUV 4.1 in the lateral right hepatic dome. Additional tiny hypodensities in the liver may be cysts; patchy/nodular opacity in the left lower lobe with max SUV 3.5 worrisome for pulmonary metastasis, less likely infectious. He began radiation and concurrent Xeloda chemotherapy on 09/29/2011. He completed radiation on 11/06/2011. MRI of the liver on 12/08/2011 showed 2 hypermetabolic liver lesions, similar in size to 09/03/2011. 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 Equivocal 7 mm lesion in the inferior right hepatic lobe. No evidence of extrahepatic metastasis within the abdomen. An ultrasound-guided biopsy of a small left hepatic lesion on 12/23/2011 was nondiagnostic. He underwent a low anterior resection with loop ileostomy on 02/06/2012 with final pathology showing a 3.5 cm invasive adenocarcinoma with abundant extracellular mucin invading through the muscularis propria into pericolonic fatty tissue; 4 of 10 pericolonic lymph nodes were positive for metastatic carcinoma; resection margins were clear (ypT3,ypN2). He began FOLFOX chemotherapy on 03/16/2012. 2. K-ras mutation detected. 3. Rectal pain and bleeding secondary to #1. Improved. 4. Frequent loose  stools secondary to #1. Improved. 5. Hypertension. 6. Tobacco use. 7. CT scan 09/03/2011 with chronic right hydronephrosis secondary to an obstructing calculus in the mid right ureter. He is followed by Dr. Brunilda Payor.  8. Status post cystoscopy, right retrograde pyelogram, ureteroscopy and insertion of  right double-J stent 11/28/2011. The double-J stent was replaced 02/06/2012. The stent has been removed. 9. Elevated BUN and creatinine-? Secondary to dehydration with a high output ostomy versus intrinsic renal failure. 10. Hypokalemia-secondary to high output ostomy. He will receive potassium in the IV fluids today. 11. Status post ultrasound-guided fine-needle aspiration of small left hepatic lesion 12/22/2011. The pathology revealed no malignancy. 12. Malaise and weight loss. 13. High output ileostomy.  Disposition-Mr. Noorani continues to have high ileostomy output. He will receive additional IV fluids in the office today and on 04/15/2012. He will increase Imodium to 2 tablets 4 times daily. We are obtaining a followup basic metabolic today. He will return for a followup visit and tentatively chemotherapy on 04/19/2012. He will contact the office in the interim with any problems.   Plan reviewed with Dr. Truett Perna.  Lonna Cobb ANP/GNP-BC

## 2012-04-14 ENCOUNTER — Ambulatory Visit: Payer: 59 | Admitting: Nurse Practitioner

## 2012-04-15 ENCOUNTER — Other Ambulatory Visit: Payer: Self-pay

## 2012-04-15 ENCOUNTER — Other Ambulatory Visit: Payer: Self-pay | Admitting: Oncology

## 2012-04-15 ENCOUNTER — Other Ambulatory Visit: Payer: 59

## 2012-04-15 ENCOUNTER — Other Ambulatory Visit: Payer: Self-pay | Admitting: *Deleted

## 2012-04-15 ENCOUNTER — Ambulatory Visit (HOSPITAL_BASED_OUTPATIENT_CLINIC_OR_DEPARTMENT_OTHER): Payer: 59

## 2012-04-15 VITALS — BP 90/68 | HR 98 | Temp 98.3°F

## 2012-04-15 DIAGNOSIS — C20 Malignant neoplasm of rectum: Secondary | ICD-10-CM

## 2012-04-15 DIAGNOSIS — Z5111 Encounter for antineoplastic chemotherapy: Secondary | ICD-10-CM

## 2012-04-15 DIAGNOSIS — R160 Hepatomegaly, not elsewhere classified: Secondary | ICD-10-CM

## 2012-04-15 LAB — BASIC METABOLIC PANEL
BUN: 59 mg/dL — ABNORMAL HIGH (ref 6–23)
Calcium: 9.2 mg/dL (ref 8.4–10.5)
Glucose, Bld: 109 mg/dL — ABNORMAL HIGH (ref 70–99)
Sodium: 135 mEq/L (ref 135–145)

## 2012-04-15 MED ORDER — SODIUM CHLORIDE 0.9 % IV SOLN: Freq: Once | INTRAVENOUS | Status: DC

## 2012-04-15 MED ORDER — SODIUM CHLORIDE 0.9 % IV SOLN
Freq: Once | INTRAVENOUS | Status: AC
  Administered 2012-04-15: 12:00:00 via INTRAVENOUS
  Filled 2012-04-15: qty 500

## 2012-04-15 MED ORDER — SODIUM CHLORIDE 0.9 % IV SOLN
Freq: Once | INTRAVENOUS | Status: AC
  Administered 2012-04-15: 09:00:00 via INTRAVENOUS

## 2012-04-15 MED ORDER — SODIUM CHLORIDE 0.9 % IJ SOLN
10.0000 mL | INTRAMUSCULAR | Status: DC | PRN
  Administered 2012-04-15: 10 mL via INTRAVENOUS
  Filled 2012-04-15: qty 10

## 2012-04-15 MED ORDER — HEPARIN SOD (PORK) LOCK FLUSH 100 UNIT/ML IV SOLN
500.0000 [IU] | Freq: Once | INTRAVENOUS | Status: AC
  Administered 2012-04-15: 500 [IU] via INTRAVENOUS
  Filled 2012-04-15: qty 5

## 2012-04-15 NOTE — Patient Instructions (Signed)
[facility] Discharge Instructions   Today you received IV fluids  To help prevent nausea and vomiting after your treatment, we encourage you to take your nausea medication as prescribed.   If you develop nausea and vomiting that is not controlled by your nausea medication, call the clinic. If it is after clinic hours your family physician or the after hours number for the clinic or go to the Emergency Department.   BELOW ARE SYMPTOMS THAT SHOULD BE REPORTED IMMEDIATELY:  *FEVER GREATER THAN 100.5 F  *CHILLS WITH OR WITHOUT FEVER  NAUSEA AND VOMITING THAT IS NOT CONTROLLED WITH YOUR NAUSEA MEDICATION  *UNUSUAL SHORTNESS OF BREATH  *UNUSUAL BRUISING OR BLEEDING  TENDERNESS IN MOUTH AND THROAT WITH OR WITHOUT PRESENCE OF ULCERS  *URINARY PROBLEMS  *BOWEL PROBLEMS  UNUSUAL RASH Items with * indicate a potential emergency and should be followed up as soon as possible.  One of the nurses will contact you 24 hours after your treatment. Please let the nurse know about any problems that you may have experienced. Feel free to call the clinic you have any questions or concerns. The clinic phone number is [phone].   I have been informed and understand all the instructions given to me. I know to contact the clinic, my physician, or go to the Emergency Department if any problems should occur. I do not have any questions at this time, but understand that I may call the clinic during office hours   should I have any questions or need assistance in obtaining follow up care.    __________________________________________  _____________  __________ Signature of Patient or Authorized Representative            Date                   Time    __________________________________________ Nurse's Signature    

## 2012-04-16 ENCOUNTER — Ambulatory Visit (HOSPITAL_BASED_OUTPATIENT_CLINIC_OR_DEPARTMENT_OTHER): Payer: 59 | Admitting: Lab

## 2012-04-16 ENCOUNTER — Other Ambulatory Visit: Payer: Self-pay | Admitting: Oncology

## 2012-04-16 ENCOUNTER — Telehealth: Payer: Self-pay | Admitting: *Deleted

## 2012-04-16 ENCOUNTER — Other Ambulatory Visit: Payer: Self-pay | Admitting: *Deleted

## 2012-04-16 DIAGNOSIS — E860 Dehydration: Secondary | ICD-10-CM

## 2012-04-16 DIAGNOSIS — C20 Malignant neoplasm of rectum: Secondary | ICD-10-CM

## 2012-04-16 LAB — BASIC METABOLIC PANEL
BUN: 59 mg/dL — ABNORMAL HIGH (ref 6–23)
CO2: 15 mEq/L — ABNORMAL LOW (ref 19–32)
Calcium: 8.7 mg/dL (ref 8.4–10.5)
Chloride: 109 mEq/L (ref 96–112)
Creatinine, Ser: 3.76 mg/dL — ABNORMAL HIGH (ref 0.50–1.35)
Glucose, Bld: 106 mg/dL — ABNORMAL HIGH (ref 70–99)

## 2012-04-16 MED ORDER — SODIUM CHLORIDE 0.45 % IV SOLN: INTRAVENOUS | Status: DC

## 2012-04-16 MED ORDER — POTASSIUM CHLORIDE CRYS ER 20 MEQ PO TBCR: 20.0000 meq | EXTENDED_RELEASE_TABLET | Freq: Two times a day (BID) | ORAL | Status: DC

## 2012-04-16 NOTE — Telephone Encounter (Signed)
Pt reports he is not taking potassium, but he has some at home. Instructed him to take 40 meq tonight. Then one tab twice daily over the weekend. Will recheck lab on Mon. Pt understands to come in for IV Fluids on 04/17/12.

## 2012-04-16 NOTE — Telephone Encounter (Signed)
Called pt, he was not given appt for lab/ infusion before leaving on 5/9. He agrees to come in this afternoon for lab.

## 2012-04-16 NOTE — Telephone Encounter (Signed)
BMET reviewed by Dr. Truett Perna: pt needs IVFluids. Called pt, appt given for 5/11 at 10 AM. He verbalized understanding.

## 2012-04-17 ENCOUNTER — Ambulatory Visit (HOSPITAL_BASED_OUTPATIENT_CLINIC_OR_DEPARTMENT_OTHER): Payer: 59

## 2012-04-17 VITALS — BP 105/64 | HR 76 | Temp 97.2°F

## 2012-04-17 DIAGNOSIS — C20 Malignant neoplasm of rectum: Secondary | ICD-10-CM

## 2012-04-17 DIAGNOSIS — E860 Dehydration: Secondary | ICD-10-CM

## 2012-04-17 MED ORDER — SODIUM CHLORIDE 0.9 % IJ SOLN
10.0000 mL | INTRAMUSCULAR | Status: DC | PRN
  Administered 2012-04-17: 10 mL via INTRAVENOUS
  Filled 2012-04-17: qty 10

## 2012-04-17 MED ORDER — SODIUM CHLORIDE 0.9 % IV SOLN
INTRAVENOUS | Status: AC
  Administered 2012-04-17: 1500 mL via INTRAVENOUS

## 2012-04-17 MED ORDER — HEPARIN SOD (PORK) LOCK FLUSH 100 UNIT/ML IV SOLN
500.0000 [IU] | Freq: Once | INTRAVENOUS | Status: AC
  Administered 2012-04-17: 500 [IU] via INTRAVENOUS
  Filled 2012-04-17: qty 5

## 2012-04-18 ENCOUNTER — Other Ambulatory Visit: Payer: Self-pay | Admitting: Oncology

## 2012-04-19 ENCOUNTER — Other Ambulatory Visit: Payer: Self-pay | Admitting: *Deleted

## 2012-04-19 ENCOUNTER — Ambulatory Visit (HOSPITAL_BASED_OUTPATIENT_CLINIC_OR_DEPARTMENT_OTHER): Payer: 59

## 2012-04-19 ENCOUNTER — Other Ambulatory Visit: Payer: Self-pay | Admitting: Oncology

## 2012-04-19 ENCOUNTER — Telehealth: Payer: Self-pay | Admitting: *Deleted

## 2012-04-19 ENCOUNTER — Other Ambulatory Visit (HOSPITAL_BASED_OUTPATIENT_CLINIC_OR_DEPARTMENT_OTHER): Payer: 59 | Admitting: Lab

## 2012-04-19 ENCOUNTER — Ambulatory Visit (HOSPITAL_BASED_OUTPATIENT_CLINIC_OR_DEPARTMENT_OTHER): Payer: 59 | Admitting: Oncology

## 2012-04-19 VITALS — BP 112/71 | HR 103 | Temp 97.5°F | Ht 70.0 in | Wt 151.5 lb

## 2012-04-19 DIAGNOSIS — C20 Malignant neoplasm of rectum: Secondary | ICD-10-CM

## 2012-04-19 DIAGNOSIS — E876 Hypokalemia: Secondary | ICD-10-CM

## 2012-04-19 DIAGNOSIS — E860 Dehydration: Secondary | ICD-10-CM

## 2012-04-19 DIAGNOSIS — C787 Secondary malignant neoplasm of liver and intrahepatic bile duct: Secondary | ICD-10-CM

## 2012-04-19 LAB — COMPREHENSIVE METABOLIC PANEL
ALT: 11 U/L (ref 0–53)
BUN: 52 mg/dL — ABNORMAL HIGH (ref 6–23)
CO2: 15 mEq/L — ABNORMAL LOW (ref 19–32)
Creatinine, Ser: 2.78 mg/dL — ABNORMAL HIGH (ref 0.50–1.35)
Total Bilirubin: 0.5 mg/dL (ref 0.3–1.2)

## 2012-04-19 LAB — CBC WITH DIFFERENTIAL/PLATELET
Eosinophils Absolute: 0 10*3/uL (ref 0.0–0.5)
LYMPH%: 20.3 % (ref 14.0–49.0)
MCHC: 35.5 g/dL (ref 32.0–36.0)
MCV: 104.1 fL — ABNORMAL HIGH (ref 79.3–98.0)
MONO%: 14.6 % — ABNORMAL HIGH (ref 0.0–14.0)
NEUT#: 2.1 10*3/uL (ref 1.5–6.5)
Platelets: 165 10*3/uL (ref 140–400)
RBC: 3.04 10*6/uL — ABNORMAL LOW (ref 4.20–5.82)
nRBC: 0 % (ref 0–0)

## 2012-04-19 LAB — MAGNESIUM: Magnesium: 1.4 mg/dL — ABNORMAL LOW (ref 1.5–2.5)

## 2012-04-19 MED ORDER — POTASSIUM CHLORIDE CRYS ER 20 MEQ PO TBCR: 20.0000 meq | EXTENDED_RELEASE_TABLET | Freq: Two times a day (BID) | ORAL | Status: DC

## 2012-04-19 MED ORDER — SODIUM CHLORIDE 0.9 % IV SOLN
INTRAVENOUS | Status: DC
  Administered 2012-04-19: 11:00:00 via INTRAVENOUS

## 2012-04-19 NOTE — Progress Notes (Signed)
Call from Darl Pikes, RN at Dr. Truett Perna' Desk. Pt will not receive chemo today only IV Fluids.

## 2012-04-19 NOTE — Patient Instructions (Signed)
[facility] Discharge Instructions for Patients Receiving Chemotherapy  Today you received IV Fluids- Normal Saline  To help prevent nausea and vomiting after your treatment, we encourage you to take your nausea medication as directed by your provider.  If you develop nausea and vomiting that is not controlled by your nausea medication, call the clinic. If it is after clinic hours your family physician or the after hours number for the clinic or go to the Emergency Department.   BELOW ARE SYMPTOMS THAT SHOULD BE REPORTED IMMEDIATELY:  *FEVER GREATER THAN 100.5 F  *CHILLS WITH OR WITHOUT FEVER  NAUSEA AND VOMITING THAT IS NOT CONTROLLED WITH YOUR NAUSEA MEDICATION  *UNUSUAL SHORTNESS OF BREATH  *UNUSUAL BRUISING OR BLEEDING  TENDERNESS IN MOUTH AND THROAT WITH OR WITHOUT PRESENCE OF ULCERS  *URINARY PROBLEMS  *BOWEL PROBLEMS  UNUSUAL RASH Items with * indicate a potential emergency and should be followed up as soon as possible.  One of the nurses will contact you 24 hours after your treatment. Please let the nurse know about any problems that you may have experienced. Feel free to call the clinic you have any questions or concerns. The clinic phone number is [phone].   I have been informed and understand all the instructions given to me. I know to contact the clinic, my physician, or go to the Emergency Department if any problems should occur. I do not have any questions at this time, but understand that I may call the clinic during office hours   should I have any questions or need assistance in obtaining follow up care.    __________________________________________  _____________  __________ Signature of Patient or Authorized Representative            Date                   Time    __________________________________________ Nurse's Signature

## 2012-04-19 NOTE — Progress Notes (Signed)
[facility]    OFFICE PROGRESS NOTE   INTERVAL HISTORY:   He returns as scheduled. He reports a markedly improvement in his energy level and appetite over the past several days. He now empties the ostomy bag approximately 4 times per day. He takes 6-8 Imodium tablets per day.  Objective:  Vital signs in last 24 hours:  Blood pressure 112/71, pulse 103, temperature 97.5 F (36.4 C), temperature source Oral, height 5\' 10"  (1.778 m), weight 151 lb 8 oz (68.72 kg).    HEENT: No thrush or ulcers, the mucous membranes are moist Resp: Lungs clear bilaterally Cardio: Regular rate and rhythm GI: No hepatomegaly, nontender, liquid yellow stool in the ostomy bag Vascular: No leg edema  Portacath/PICC-without erythema  Lab Results:  Lab Results  Component Value Date   WBC 3.4* 04/19/2012   HGB 11.2* 04/19/2012   HCT 31.7* 04/19/2012   MCV 104.1* 04/19/2012   PLT 165 04/19/2012   ANC 2.1 Potassium 3.1, BUN 52, creatinine 2.78 (BUN 59, creatinine 3.760 04/16/2012)    Medications: I have reviewed the patient's current medications.  Assessment/Plan: 1. Metastatic rectal cancer status post biopsy of rectal mass 09/03/2011 with pathology showing invasive adenocarcinoma. Staging CT scans 09/03/2011 showed a 10 mm hypodense rounded lesion in left lateral hepatic lobe, similar 6 mm lesion in the superior right hepatic lobe and a smaller subcapsular lesion in the lateral right hepatic lobe; rectal mass within the lumen of the bowel emanating from the left wall measuring 4.9 x 3.5 cm and a mass within the low left perirectal fat measuring 2.3 x 3.2 cm consistent with local extension of carcinoma. Staging PET scan 09/18/2011 showed a 5.1 cm distal sigmoid/rectal mass with max SUV 19.2 with local extension into the left perirectal fat with max SUV 7.7. At least 2 suspected hepatic metastases with max SUV 6.7 in the lateral segment left hepatic lobe and max SUV 4.1 in the lateral right  hepatic dome. Additional tiny hypodensities in the liver may be cysts; patchy/nodular opacity in the left lower lobe with max SUV 3.5 worrisome for pulmonary metastasis, less likely infectious. He began radiation and concurrent Xeloda chemotherapy on 09/29/2011. He completed radiation on 11/06/2011. MRI of the liver on 12/08/2011 showed 2 hypermetabolic liver lesions, similar in size to 09/03/2011. 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 Equivocal 7 mm lesion in the inferior right hepatic lobe. No evidence of extrahepatic metastasis within the abdomen. An ultrasound-guided biopsy of a small left hepatic lesion on 12/23/2011 was nondiagnostic. He underwent a low anterior resection with loop ileostomy on 02/06/2012 with final pathology showing a 3.5 cm invasive adenocarcinoma with abundant extracellular mucin invading through the muscularis propria into pericolonic fatty tissue; 4 of 10 pericolonic lymph nodes were positive for metastatic carcinoma; resection margins were clear (ypT3,ypN2). He began FOLFOX chemotherapy on 03/16/2012. 2. K-ras mutation detected. 3. Rectal pain and bleeding secondary to #1. Improved. 4. Frequent loose stools secondary to #1. Improved. 5. Hypertension. 6. Tobacco use. 7. CT scan 09/03/2011 with chronic right hydronephrosis secondary to an obstructing calculus in the mid right ureter. He is followed by Dr. Brunilda Payor.  8. Status post cystoscopy, right retrograde pyelogram, ureteroscopy and insertion of right double-J stent 11/28/2011. The double-J stent was replaced 02/06/2012. The stent has been removed. 9. Elevated BUN and creatinine-? Secondary to dehydration with a high output ostomy versus intrinsic renal failure. Partially improved following intravenous hydration last week. He will receive  intravenous fluids today, 04/21/2012, and 04/23/2012. 10. Hypokalemia-secondary to high output ostomy. He will continue a  potassium supplement. 11. Status post ultrasound-guided fine-needle aspiration of small left hepatic lesion 12/22/2011. The pathology revealed no malignancy.   Disposition:  His performance status appears improved. He reports a markedly improved energy level over the past several days. He will receive IV fluid 3 days this week. We continue to monitor the elevated BUN and creatinine closely. It is not clear whether the elevated creatinine is solely related to dehydration versus intrinsic renal failure. He is scheduled to see Dr. Lowell Guitar on Apr 21 2012.  Chemotherapy remains on hold. We should consider and ileostomy reversal procedure if Dr. Lowell Guitar feels the elevated creatinine is related to dehydration. Mr. Sizemore will return for an office visit on 04/26/2012.   Thornton Papas, MD  04/19/2012  2:08 PM

## 2012-04-19 NOTE — Telephone Encounter (Signed)
Left VM for patient with K+ level and to resume Ktab twice daily.

## 2012-04-19 NOTE — Telephone Encounter (Signed)
Per staff message from [place], I have scheduled IVF's for the patient. Anne aware. Per order IVF on 5/15 and 5/17, also 5/19 but docoter meant 5/20. Fluids added to chemo treatment on 5/20.   JMW

## 2012-04-21 ENCOUNTER — Other Ambulatory Visit: Payer: 59 | Admitting: Lab

## 2012-04-21 ENCOUNTER — Ambulatory Visit (HOSPITAL_BASED_OUTPATIENT_CLINIC_OR_DEPARTMENT_OTHER): Payer: 59

## 2012-04-21 ENCOUNTER — Other Ambulatory Visit: Payer: Self-pay | Admitting: *Deleted

## 2012-04-21 VITALS — BP 105/68 | HR 86 | Temp 97.4°F

## 2012-04-21 DIAGNOSIS — C20 Malignant neoplasm of rectum: Secondary | ICD-10-CM

## 2012-04-21 DIAGNOSIS — E860 Dehydration: Secondary | ICD-10-CM

## 2012-04-21 LAB — BASIC METABOLIC PANEL
CO2: 13 mEq/L — ABNORMAL LOW (ref 19–32)
Chloride: 117 mEq/L — ABNORMAL HIGH (ref 96–112)
Glucose, Bld: 88 mg/dL (ref 70–99)
Potassium: 3.7 mEq/L (ref 3.5–5.3)
Sodium: 138 mEq/L (ref 135–145)

## 2012-04-21 MED ORDER — SODIUM CHLORIDE 0.9 % IV SOLN
Freq: Once | INTRAVENOUS | Status: AC
  Administered 2012-04-21: 1500 mL via INTRAVENOUS

## 2012-04-21 NOTE — Patient Instructions (Signed)
[facility] Discharge Instructions for Patients Receiving Chemotherapy  Today you received Normal Saline  To help prevent nausea and vomiting after your treatment, we encourage you to take your nausea medication Begin taking it at  7 pm  and take it as often as prescribed for the next 24 to 72 hours.   If you develop nausea and vomiting that is not controlled by your nausea medication, call the clinic. If it is after clinic hours your family physician or the after hours number for the clinic or go to the Emergency Department.   BELOW ARE SYMPTOMS THAT SHOULD BE REPORTED IMMEDIATELY:  *FEVER GREATER THAN 100.5 F  *CHILLS WITH OR WITHOUT FEVER  NAUSEA AND VOMITING THAT IS NOT CONTROLLED WITH YOUR NAUSEA MEDICATION  *UNUSUAL SHORTNESS OF BREATH  *UNUSUAL BRUISING OR BLEEDING  TENDERNESS IN MOUTH AND THROAT WITH OR WITHOUT PRESENCE OF ULCERS  *URINARY PROBLEMS  *BOWEL PROBLEMS  UNUSUAL RASH Items with * indicate a potential emergency and should be followed up as soon as possible.  One of the nurses will contact you 24 hours after your treatment. Please let the nurse know about any problems that you may have experienced. Feel free to call the clinic you have any questions or concerns. The clinic phone number is [phone].   I have been informed and understand all the instructions given to me. I know to contact the clinic, my physician, or go to the Emergency Department if any problems should occur. I do not have any questions at this time, but understand that I may call the clinic during office hours   should I have any questions or need assistance in obtaining follow up care.    __________________________________________  _____________  __________ Signature of Patient or Authorized Representative            Date                   Time    __________________________________________ Nurse's Signature    

## 2012-04-23 ENCOUNTER — Ambulatory Visit (HOSPITAL_BASED_OUTPATIENT_CLINIC_OR_DEPARTMENT_OTHER): Payer: 59

## 2012-04-23 VITALS — BP 115/73 | HR 88 | Temp 98.4°F | Resp 20

## 2012-04-23 DIAGNOSIS — C20 Malignant neoplasm of rectum: Secondary | ICD-10-CM

## 2012-04-23 MED ORDER — SODIUM CHLORIDE 0.9 % IV SOLN
INTRAVENOUS | Status: DC
  Administered 2012-04-23: 10:00:00 via INTRAVENOUS

## 2012-04-23 MED ORDER — HEPARIN SOD (PORK) LOCK FLUSH 100 UNIT/ML IV SOLN
500.0000 [IU] | Freq: Once | INTRAVENOUS | Status: AC
  Administered 2012-04-23: 500 [IU] via INTRAVENOUS
  Filled 2012-04-23: qty 5

## 2012-04-23 MED ORDER — SODIUM CHLORIDE 0.9 % IJ SOLN
10.0000 mL | INTRAMUSCULAR | Status: DC | PRN
  Administered 2012-04-23: 10 mL via INTRAVENOUS
  Filled 2012-04-23: qty 10

## 2012-04-23 NOTE — Progress Notes (Signed)
Vitals signs stable @ discharge; patient ambulates well and has no complaints.

## 2012-04-23 NOTE — Patient Instructions (Signed)
Patient aware of next appointment; discharged home with no complaints. 

## 2012-04-26 ENCOUNTER — Ambulatory Visit (HOSPITAL_BASED_OUTPATIENT_CLINIC_OR_DEPARTMENT_OTHER): Payer: 59

## 2012-04-26 ENCOUNTER — Other Ambulatory Visit (HOSPITAL_BASED_OUTPATIENT_CLINIC_OR_DEPARTMENT_OTHER): Payer: 59 | Admitting: Lab

## 2012-04-26 ENCOUNTER — Ambulatory Visit (HOSPITAL_BASED_OUTPATIENT_CLINIC_OR_DEPARTMENT_OTHER): Payer: 59 | Admitting: Oncology

## 2012-04-26 VITALS — BP 106/71 | HR 100 | Temp 97.1°F | Ht 70.0 in | Wt 151.6 lb

## 2012-04-26 DIAGNOSIS — C20 Malignant neoplasm of rectum: Secondary | ICD-10-CM

## 2012-04-26 DIAGNOSIS — R197 Diarrhea, unspecified: Secondary | ICD-10-CM

## 2012-04-26 DIAGNOSIS — E860 Dehydration: Secondary | ICD-10-CM

## 2012-04-26 DIAGNOSIS — C787 Secondary malignant neoplasm of liver and intrahepatic bile duct: Secondary | ICD-10-CM

## 2012-04-26 DIAGNOSIS — C184 Malignant neoplasm of transverse colon: Secondary | ICD-10-CM

## 2012-04-26 DIAGNOSIS — R634 Abnormal weight loss: Secondary | ICD-10-CM

## 2012-04-26 LAB — COMPREHENSIVE METABOLIC PANEL
AST: 14 U/L (ref 0–37)
Albumin: 4.1 g/dL (ref 3.5–5.2)
BUN: 55 mg/dL — ABNORMAL HIGH (ref 6–23)
Calcium: 9.2 mg/dL (ref 8.4–10.5)
Chloride: 110 mEq/L (ref 96–112)
Creatinine, Ser: 3.1 mg/dL — ABNORMAL HIGH (ref 0.50–1.35)
Glucose, Bld: 127 mg/dL — ABNORMAL HIGH (ref 70–99)
Potassium: 4.8 mEq/L (ref 3.5–5.3)

## 2012-04-26 LAB — CBC WITH DIFFERENTIAL/PLATELET
BASO%: 0.1 % (ref 0.0–2.0)
Basophils Absolute: 0 10*3/uL (ref 0.0–0.1)
EOS%: 0.5 % (ref 0.0–7.0)
HCT: 31.6 % — ABNORMAL LOW (ref 38.4–49.9)
HGB: 11.1 g/dL — ABNORMAL LOW (ref 13.0–17.1)
LYMPH%: 8.5 % — ABNORMAL LOW (ref 14.0–49.0)
MCH: 35.7 pg — ABNORMAL HIGH (ref 27.2–33.4)
MCHC: 35.1 g/dL (ref 32.0–36.0)
NEUT%: 85.6 % — ABNORMAL HIGH (ref 39.0–75.0)
Platelets: 165 10*3/uL (ref 140–400)

## 2012-04-26 MED ORDER — SODIUM CHLORIDE 0.9 % IV SOLN
INTRAVENOUS | Status: AC
  Administered 2012-04-26: 10:00:00 via INTRAVENOUS

## 2012-04-26 NOTE — Patient Instructions (Signed)
[facility] Discharge Instructions for Patients Receiving Chemotherapy  Today you received fluids for dehydration  To help prevent nausea and vomiting we encourage you to take your nausea medication when needed.   If you develop nausea and vomiting that is not controlled by your nausea medication, call the clinic. If it is after clinic hours your family physician or the after hours number for the clinic or go to the Emergency Department.   BELOW ARE SYMPTOMS THAT SHOULD BE REPORTED IMMEDIATELY:  *FEVER GREATER THAN 100.5 F  *CHILLS WITH OR WITHOUT FEVER  NAUSEA AND VOMITING THAT IS NOT CONTROLLED WITH YOUR NAUSEA MEDICATION  *UNUSUAL SHORTNESS OF BREATH  *UNUSUAL BRUISING OR BLEEDING  TENDERNESS IN MOUTH AND THROAT WITH OR WITHOUT PRESENCE OF ULCERS  *URINARY PROBLEMS  *BOWEL PROBLEMS  UNUSUAL RASH Items with * indicate a potential emergency and should be followed up as soon as possible.  One of the nurses will contact you 24 hours after your treatment. Please let the nurse know about any problems that you may have experienced. Feel free to call the clinic you have any questions or concerns. The clinic phone number is [phone].   I have been informed and understand all the instructions given to me. I know to contact the clinic, my physician, or go to the Emergency Department if any problems should occur. I do not have any questions at this time, but understand that I may call the clinic during office hours   should I have any questions or need assistance in obtaining follow up care.    __________________________________________  _____________  __________ Signature of Patient or Authorized Representative            Date                   Time    __________________________________________ Nurse's Signature

## 2012-04-26 NOTE — Progress Notes (Signed)
[facility]    OFFICE PROGRESS NOTE   INTERVAL HISTORY:   He reports feeling better. The stool output had slowed with Imodium until last night. He has noted an increased amount of stool output since yesterday evening.  Objective:  Vital signs in last 24 hours:  Blood pressure 106/71, pulse 100, temperature 97.1 F (36.2 C), temperature source Oral, height 5\' 10"  (1.778 m), weight 151 lb 9.6 oz (68.765 kg).    HEENT: The mucous membranes are moist, no thrush or ulcers Resp: Lungs clear bilaterally Cardio: Regular rate and rhythm GI: No hepatomegaly, small amount of semi-formed stool in the ostomy bag, nontender Vascular: No leg edema   Portacath/PICC-without erythema  Lab Results:  Lab Results  Component Value Date   WBC 7.9 04/26/2012   HGB 11.1* 04/26/2012   HCT 31.6* 04/26/2012   MCV 101.6* 04/26/2012   PLT 165 04/26/2012   BUN 55, creatinine 3.1, potassium 4.8, sodium 136, albumin 4.1   Medications: I have reviewed the patient's current medications.  Assessment/Plan: 1. Metastatic rectal cancer status post biopsy of rectal mass 09/03/2011 with pathology showing invasive adenocarcinoma. Staging CT scans 09/03/2011 showed a 10 mm hypodense rounded lesion in left lateral hepatic lobe, similar 6 mm lesion in the superior right hepatic lobe and a smaller subcapsular lesion in the lateral right hepatic lobe; rectal mass within the lumen of the bowel emanating from the left wall measuring 4.9 x 3.5 cm and a mass within the low left perirectal fat measuring 2.3 x 3.2 cm consistent with local extension of carcinoma. Staging PET scan 09/18/2011 showed a 5.1 cm distal sigmoid/rectal mass with max SUV 19.2 with local extension into the left perirectal fat with max SUV 7.7. At least 2 suspected hepatic metastases with max SUV 6.7 in the lateral segment left hepatic lobe and max SUV 4.1 in the lateral right hepatic dome. Additional tiny hypodensities in the liver may be  cysts; patchy/nodular opacity in the left lower lobe with max SUV 3.5 worrisome for pulmonary metastasis, less likely infectious. He began radiation and concurrent Xeloda chemotherapy on 09/29/2011. He completed radiation on 11/06/2011. MRI of the liver on 12/08/2011 showed 2 hypermetabolic liver lesions, similar in size to 09/03/2011. 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 Equivocal 7 mm lesion in the inferior right hepatic lobe. No evidence of extrahepatic metastasis within the abdomen. An ultrasound-guided biopsy of a small left hepatic lesion on 12/23/2011 was nondiagnostic. He underwent a low anterior resection with loop ileostomy on 02/06/2012 with final pathology showing a 3.5 cm invasive adenocarcinoma with abundant extracellular mucin invading through the muscularis propria into pericolonic fatty tissue; 4 of 10 pericolonic lymph nodes were positive for metastatic carcinoma; resection margins were clear (ypT3,ypN2). He began FOLFOX chemotherapy on 03/16/2012. He completed 2 cycles of FOLFOX. 2. K-ras mutation detected. 3. Rectal pain and bleeding secondary to #1. Improved. 4. Frequent loose stools secondary to #1. Improved. 5. Hypertension. 6. Tobacco use. 7. CT scan 09/03/2011 with chronic right hydronephrosis secondary to an obstructing calculus in the mid right ureter. He is followed by Dr. Brunilda Payor.  8. Status post cystoscopy, right retrograde pyelogram, ureteroscopy and insertion of right double-J stent 11/28/2011. The double-J stent was replaced 02/06/2012. The stent has been removed. 9. Elevated BUN and creatinine-? Secondary to dehydration with a high output ostomy versus intrinsic renal failure. 10. Hypokalemia-secondary to high output ostomy. He will receive potassium in the IV fluids today. 11. Status  post ultrasound-guided fine-needle aspiration of small left hepatic lesion 12/22/2011. The pathology revealed no  malignancy. 12. Malaise and weight loss. 13. High output ileostomy. Partially improved with Imodium   Disposition:  His performance status appears improved while on Imodium and with IV fluids. However the BUN and creatinine remain elevated. We will followup on the consult by Dr. Lowell Guitar from last week. I suspect the elevated creatinine is related to the ileostomy. I discussed the case with Dr. Ezzard Standing. We decided to hold further chemotherapy until the ileostomy is reversed. We will try to get him seen by Dr. Ezzard Standing this week.  Mr. Hopkin will continue to receive IV fluids at the cancer Center. He will return for an office visit on 05/05/2012.   Thornton Papas, MD  04/26/2012  7:08 PM

## 2012-04-27 ENCOUNTER — Other Ambulatory Visit: Payer: Self-pay | Admitting: *Deleted

## 2012-04-27 ENCOUNTER — Telehealth: Payer: Self-pay | Admitting: Oncology

## 2012-04-27 NOTE — Telephone Encounter (Signed)
Called pt, left message regarding IVF on 04/29/12, advised pt to get new calendar.

## 2012-04-29 ENCOUNTER — Other Ambulatory Visit: Payer: Self-pay | Admitting: Oncology

## 2012-04-29 ENCOUNTER — Other Ambulatory Visit: Payer: 59 | Admitting: Lab

## 2012-04-29 ENCOUNTER — Ambulatory Visit (HOSPITAL_BASED_OUTPATIENT_CLINIC_OR_DEPARTMENT_OTHER): Payer: 59

## 2012-04-29 VITALS — BP 119/76 | HR 103 | Temp 97.8°F

## 2012-04-29 DIAGNOSIS — E860 Dehydration: Secondary | ICD-10-CM

## 2012-04-29 DIAGNOSIS — C20 Malignant neoplasm of rectum: Secondary | ICD-10-CM

## 2012-04-29 LAB — BASIC METABOLIC PANEL
CO2: 10 mEq/L — CL (ref 19–32)
Chloride: 105 mEq/L (ref 96–112)
Creatinine, Ser: 4.26 mg/dL — ABNORMAL HIGH (ref 0.50–1.35)
Potassium: 4.8 mEq/L (ref 3.5–5.3)

## 2012-04-29 MED ORDER — SODIUM CHLORIDE 0.9 % IV SOLN
INTRAVENOUS | Status: AC
  Administered 2012-04-29: 16:00:00 via INTRAVENOUS

## 2012-04-29 NOTE — Progress Notes (Signed)
Pt was late getting started with infusion today, would only like 1 L NS, as he would not like to stay late.  Verbal order rec'd from Dr. Truett Perna, ok to administer 1 L NS over 2 hours.  Pt also wants to know could he take any other medication for his sinuses that are "running,"  Such as claritin or zyrtec.  Pt has been using a current cold medication w/o much success.  Per Dr. Truett Perna, pt should not take a lot of other medications right now, d/t his renal function.  Informed pt of this, and he verbalizes understanding.

## 2012-04-30 ENCOUNTER — Ambulatory Visit (INDEPENDENT_AMBULATORY_CARE_PROVIDER_SITE_OTHER): Payer: 59 | Admitting: Surgery

## 2012-04-30 ENCOUNTER — Other Ambulatory Visit: Payer: Self-pay | Admitting: Oncology

## 2012-04-30 ENCOUNTER — Ambulatory Visit: Payer: 59

## 2012-04-30 ENCOUNTER — Other Ambulatory Visit: Payer: Self-pay | Admitting: *Deleted

## 2012-04-30 ENCOUNTER — Telehealth: Payer: Self-pay | Admitting: *Deleted

## 2012-04-30 ENCOUNTER — Ambulatory Visit (HOSPITAL_BASED_OUTPATIENT_CLINIC_OR_DEPARTMENT_OTHER): Payer: 59

## 2012-04-30 ENCOUNTER — Encounter: Payer: Self-pay | Admitting: Internal Medicine

## 2012-04-30 ENCOUNTER — Other Ambulatory Visit: Payer: Self-pay | Admitting: Nurse Practitioner

## 2012-04-30 ENCOUNTER — Inpatient Hospital Stay (HOSPITAL_COMMUNITY)
Admission: AD | Admit: 2012-04-30 | Discharge: 2012-05-09 | DRG: 982 | Disposition: A | Discharge status: Home / Self Care | Payer: 59 | Source: Ambulatory Visit | Attending: General Surgery | Admitting: General Surgery

## 2012-04-30 VITALS — BP 110/70 | HR 66 | Temp 98.2°F | Resp 24 | Ht 70.0 in | Wt 149.4 lb

## 2012-04-30 DIAGNOSIS — C20 Malignant neoplasm of rectum: Secondary | ICD-10-CM | POA: Diagnosis present

## 2012-04-30 DIAGNOSIS — I10 Essential (primary) hypertension: Secondary | ICD-10-CM | POA: Diagnosis present

## 2012-04-30 DIAGNOSIS — K7689 Other specified diseases of liver: Secondary | ICD-10-CM | POA: Diagnosis present

## 2012-04-30 DIAGNOSIS — E871 Hypo-osmolality and hyponatremia: Secondary | ICD-10-CM | POA: Diagnosis present

## 2012-04-30 DIAGNOSIS — N200 Calculus of kidney: Secondary | ICD-10-CM | POA: Diagnosis present

## 2012-04-30 DIAGNOSIS — E860 Dehydration: Secondary | ICD-10-CM | POA: Diagnosis present

## 2012-04-30 DIAGNOSIS — Z9221 Personal history of antineoplastic chemotherapy: Secondary | ICD-10-CM

## 2012-04-30 DIAGNOSIS — F17200 Nicotine dependence, unspecified, uncomplicated: Secondary | ICD-10-CM | POA: Diagnosis present

## 2012-04-30 DIAGNOSIS — J4489 Other specified chronic obstructive pulmonary disease: Secondary | ICD-10-CM | POA: Diagnosis present

## 2012-04-30 DIAGNOSIS — Z862 Personal history of diseases of the blood and blood-forming organs and certain disorders involving the immune mechanism: Secondary | ICD-10-CM | POA: Diagnosis present

## 2012-04-30 DIAGNOSIS — R42 Dizziness and giddiness: Secondary | ICD-10-CM

## 2012-04-30 DIAGNOSIS — D696 Thrombocytopenia, unspecified: Secondary | ICD-10-CM | POA: Diagnosis present

## 2012-04-30 DIAGNOSIS — E872 Acidosis, unspecified: Secondary | ICD-10-CM

## 2012-04-30 DIAGNOSIS — N179 Acute kidney failure, unspecified: Principal | ICD-10-CM | POA: Diagnosis present

## 2012-04-30 DIAGNOSIS — N189 Chronic kidney disease, unspecified: Secondary | ICD-10-CM | POA: Diagnosis present

## 2012-04-30 DIAGNOSIS — I129 Hypertensive chronic kidney disease with stage 1 through stage 4 chronic kidney disease, or unspecified chronic kidney disease: Secondary | ICD-10-CM | POA: Diagnosis present

## 2012-04-30 DIAGNOSIS — C210 Malignant neoplasm of anus, unspecified: Secondary | ICD-10-CM

## 2012-04-30 DIAGNOSIS — N1330 Unspecified hydronephrosis: Secondary | ICD-10-CM | POA: Diagnosis present

## 2012-04-30 DIAGNOSIS — D539 Nutritional anemia, unspecified: Secondary | ICD-10-CM | POA: Diagnosis present

## 2012-04-30 DIAGNOSIS — Z432 Encounter for attention to ileostomy: Secondary | ICD-10-CM

## 2012-04-30 DIAGNOSIS — N19 Unspecified kidney failure: Secondary | ICD-10-CM

## 2012-04-30 DIAGNOSIS — J449 Chronic obstructive pulmonary disease, unspecified: Secondary | ICD-10-CM | POA: Diagnosis present

## 2012-04-30 DIAGNOSIS — Z923 Personal history of irradiation: Secondary | ICD-10-CM

## 2012-04-30 HISTORY — DX: Hydroureter: N13.4

## 2012-04-30 HISTORY — DX: Calculus of kidney: N20.0

## 2012-04-30 LAB — CBC
Hemoglobin: 10.1 g/dL — ABNORMAL LOW (ref 13.0–17.0)
MCH: 36.7 pg — ABNORMAL HIGH (ref 26.0–34.0)
MCHC: 35.8 g/dL (ref 30.0–36.0)
Platelets: 163 10*3/uL (ref 150–400)
RDW: 16.2 % — ABNORMAL HIGH (ref 11.5–15.5)

## 2012-04-30 LAB — BASIC METABOLIC PANEL
CO2: 9 mEq/L — CL (ref 19–32)
Chloride: 109 mEq/L (ref 96–112)
Glucose, Bld: 119 mg/dL — ABNORMAL HIGH (ref 70–99)
Sodium: 131 mEq/L — ABNORMAL LOW (ref 135–145)

## 2012-04-30 LAB — CREATININE, SERUM
Creatinine, Ser: 3.94 mg/dL — ABNORMAL HIGH (ref 0.50–1.35)
GFR calc non Af Amer: 15 mL/min — ABNORMAL LOW (ref >90)

## 2012-04-30 MED ORDER — ACETAMINOPHEN 650 MG RE SUPP: 650.0000 mg | Freq: Four times a day (QID) | RECTAL | Status: DC | PRN

## 2012-04-30 MED ORDER — SODIUM BICARBONATE 8.4 % IV SOLN: INTRAVENOUS | Status: DC

## 2012-04-30 MED ORDER — SODIUM CHLORIDE 0.9 % IV SOLN
INTRAVENOUS | Status: AC
  Administered 2012-04-30: 12:00:00 via INTRAVENOUS

## 2012-04-30 MED ORDER — SODIUM BICARBONATE 8.4 % IV SOLN
Freq: Once | INTRAVENOUS | Status: AC
  Administered 2012-04-30: 13:00:00 via INTRAVENOUS
  Filled 2012-04-30: qty 1000

## 2012-04-30 MED ORDER — ENOXAPARIN SODIUM 40 MG/0.4ML ~~LOC~~ SOLN: 40.0000 mg | SUBCUTANEOUS | Status: DC

## 2012-04-30 MED ORDER — ENOXAPARIN SODIUM 30 MG/0.3ML ~~LOC~~ SOLN
30.0000 mg | SUBCUTANEOUS | Status: DC
  Administered 2012-04-30 – 2012-05-02 (×3): 30 mg via SUBCUTANEOUS
  Filled 2012-04-30 (×4): qty 0.3

## 2012-04-30 MED ORDER — ACETAMINOPHEN 325 MG PO TABS: 650.0000 mg | ORAL_TABLET | Freq: Four times a day (QID) | ORAL | Status: DC | PRN

## 2012-04-30 MED ORDER — ONDANSETRON HCL 4 MG PO TABS: 4.0000 mg | ORAL_TABLET | Freq: Four times a day (QID) | ORAL | Status: DC | PRN

## 2012-04-30 MED ORDER — OXYCODONE HCL 5 MG PO TABS
5.0000 mg | ORAL_TABLET | ORAL | Status: DC | PRN
  Administered 2012-05-04: 5 mg via ORAL
  Filled 2012-04-30: qty 1

## 2012-04-30 MED ORDER — ONDANSETRON HCL 4 MG/2ML IJ SOLN: 4.0000 mg | Freq: Four times a day (QID) | INTRAMUSCULAR | Status: DC | PRN

## 2012-04-30 MED ORDER — ALUM & MAG HYDROXIDE-SIMETH 200-200-20 MG/5ML PO SUSP: 30.0000 mL | Freq: Four times a day (QID) | ORAL | Status: DC | PRN

## 2012-04-30 MED ORDER — SODIUM BICARBONATE 8.4 % IV SOLN
INTRAVENOUS | Status: DC
  Administered 2012-04-30 – 2012-05-01 (×2): via INTRAVENOUS
  Filled 2012-04-30 (×4): qty 150

## 2012-04-30 NOTE — Progress Notes (Signed)
Patient:  [name] [name] MRN:  [id number] DOB:  01/26/1951   ASSESSMENT AND PLAN: 1.  Rectal Cancer.  Path -  ypT3, ypN2, 4/10 nodes positive.   KRAS mutation detected.  Completed neoadjuvant therapy by Drs. Sherrill and [place] end of Nov/beginning of Dec 2012.  He had LAR - 02/06/2012.  Has diverting ileostomy.  CEA - 8.4 on 03/09/2012  Dr. Truett Perna has been treating him with chemotx post op, but Mr. Dykstra's renal function has deteriorated.  He has struggled with high ileostomy output and renal dysfunction.  I've spoken to Dr. Truett Perna and we have agreed that he will hold chemotx and we will plan to reverse his ileostomy - hopefully to stabilize his fluid management and to make his chemo tx treatment more stable.  I discussed the indication and complications of reversal of the ileostomy.  We will do a BE first and then schedule the surgery.  I will admit him the night before to hydrate the patient.  He does not need a bowel prep.  2.  Hepatic nodules.  ? Mets?  Needle aspiration of right lobe of liver by Dr. Jacqualine Code - 12/22/2011 - Benign.   But he had firm nodules in both lobes of liver palpable at the time of surgery.   3.  Right hydroureter secondary to nephrolithiasis.     Dr. Brunilda Payor explored at surgery, but could not remove stone. 3.5  Chronic renal insufficiency with worsening creatinine.    Creat - 04/29/2012 - 4.26.  4.  Power port - 03/10/2012 - D. Farhan Jean 5.  Hypertension. 6.  Smokes.    He has cut back, but has not quit. 7.  He says he has some sinus issues - but he has had these most of the time I see him.  HISTORY OF PRESENT ILLNESS: Tamarcus [name] is a 61 y.o. (DOB: August 11, 1951)  AA male whose primary care physician is Carrie Mew, MD, MD and comes to me today for follow for surgery on a rectal cancer.  The patient has had some rectal bleeding for about one year.   He saw Dr. Jarold Motto who did a colonoscopy on him on 09/03/2011.  Dr. Jarold Motto saw in [name] [name]'s rectum,  there was a 5 cm villous/necrotic tumor.  Biopsy of the rectal tumor read by Dr. [place] Rund showed invasive adenocarcinoma (Case No.: [id number].)  The patient also had a CT scan the same day.  The CT scan showed: 1. Large 4 cm rectal mass. 2. Left perirectal mass is concerning for local extension of the tumor. 3. Multiple hepatic hypodensities are concerning for hepatic metastasis. 4. Chronic right hydronephrosis secondary to a obstructing calculus in the mid right ureter.  He completed neoadjuvant therapy by Drs. Sherrill and [place] end of Nov/beginning of Dec 2012.  He had his LAR 02/06/2012 and has a ileostomy.    Dr. Truett Perna has struggled with keeping [name] [name] hydrated.  Mr [name]'s creatinine has crept up.  He saw Dr. Mervyn Skeeters. Powell on 04/22/2012 who agrees that dehydration is contributing.  (I spoke to Dr. Lowell Guitar by phone)  We talked about reversing the ileostomy, though there are certainly risks to this.  I discussed the surgery, its benefits and risks.  The two main risks are leak for the re-anastomosis and worsening kidney function.  Past Medical History  Diagnosis Date  . Hypertension   . Abnormal EKG 11/01/11    REPORT IN EPIC-PT STATES NO KNOWN HEART PROBLEMS-NO CHEST PAINS  . Arthritis  IN BOTH SHOULDERS  . Low blood potassium     POTASSIUM WAS 2.1 ON 11/01/11 VISIT TO ER--PT PUT ON ORAL POTASSIUM--MOST RECENT LAB 11/24/11 POTASSIUM IMPROVED TO 3.1  . History of radiation therapy 09/29/11-11/07/11    rectal ca  . Nasal congestion   . COPD (chronic obstructive pulmonary disease)   . Heart murmur     SINCE BIRTH--DOES NOT CAUSE ANY PROBLEMS  . Dyspnea     no recent bronchitis  . Recurrent upper respiratory infection (URI)     HEAD COLD   . Sleep apnea     stop-bang score =6 (NO SLEEP STUDY DONE YET)  . No pertinent past medical history     2 SPOTS ON LIVER JUST WATCHING  . Hydronephrosis of right kidney     "Chronic" -SECONDARY TO CALCULUS (STENT RT URETER) MID RT URETER  AND ADDITIONAL CALCULI WITHIN RT KIDNEY--PT STATES HE HAS PASSED A STONE IN THE PAST--DID NOT KNOW HE HAD MORE STONES UNTIL CT ABD DONE ON 09/03/11  . Rectal cancer     HAS COMPLETED RADIATION AND XELODA--PLAN RESTAGING TESTING.  [facility]  . Cancer     rectal adenocarcinoma stage T3NxMx   No Known Allergies  REVIEW OF SYSTEMS: Cardiac:  Hypertension x 10 years. No history of heart disease.  No history of prior cardiac catheterization.  No history of seeing a cardiologist. Pulmonary:  Smokes cigarettes - 1-1 1/2 PPD. No OSA/CPAP.  Gastrointestinal:  See HPI. Urologic:  History of kidney stones.  Sees Dr. Irving Burton. Musculoskeletal:  Bilateral shoulder pain.  Not seeing anyone for this.  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 No family history of colon cancer.  PHYSICAL EXAM: BP 110/70  Pulse 66  Temp(Src) 98.2 F (36.8 C) (Temporal)  Resp 24  Ht 5\' 10"  (1.778 m)  Wt 149 lb 6 oz (67.756 kg)  BMI 21.43 kg/m2  General: WN BM.  He is thinner. HEENT: Normal. Pupils equal. Chest:  Port in left upper chest. Lungs:  Clear. Heart:  RRR. Abdomen: Ileostomy RLQ. Doing okay.  Abdomen soft.  DATA REVIEWED: Creat - 04/29/2012 - 4.26 CEA - 8.4 on 03/09/2012  Ovidio Kin, M.D., [facility], [place]

## 2012-04-30 NOTE — Telephone Encounter (Signed)
Made patient aware that his creatinine returned late yesterday elevated and Dr. Truett Perna wants him in today for IVF. Will call him back with time when infusion charge nurse has opening.

## 2012-04-30 NOTE — Progress Notes (Signed)
Addended by: Amaryllis Dyke on: 04/30/2012 11:44 AM   Modules accepted: Orders

## 2012-04-30 NOTE — Patient Instructions (Signed)
Go for Barium Enema test on Friday May 31st arrive at 0915 for a 0930 appt at [facility] [address] [facility].  Have nothing to eat or drink after midnight.

## 2012-04-30 NOTE — H&P (Signed)
Hospital Admission Note Date: 04/30/2012  Patient name: [name] [name] Medical record number: [id number] Date of birth: March 25, 1951 Age: 61 y.o. Gender: male PCP: [name] Mew, MD, MD Oncologist: Dr. Mancel [name]  Attending physician: [name] Bun Annisten Manchester, MD Emergency Contact: [name] [name], spouse, [phone] Code Status:  Full   Chief Complaint: No specific complaints.  History of Present Illness: [name] [name] is an 61 y.o. male with a PMH of rectal cancer diagnosed back in September of 2012, status post neoadjuvant chemotherapy and resection of tumor with diverting ileostomy on 02/06/12 whose postoperative course has been complicated by high ileostomy output and consequent renal failure.  He has been managed with IVF infusion at the oncology clinic several times per week, but despite this, his renal function has deteriorated and he now has significant metabolic acidosis, so he is being referred as a direct admission from Dr. Kalman [name] office for inpatient treatment with bicarbonate containing IVF.  No specific complaints but he does endorse getting light headed once in awhile.  Past Medical History Past Medical History  Diagnosis Date  . Hypertension   . Abnormal EKG 11/01/11    REPORT IN EPIC-PT STATES NO KNOWN HEART PROBLEMS-NO CHEST PAINS  . 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 Low blood potassium     POTASSIUM WAS 2.1 ON 11/01/11 VISIT TO ER--PT PUT ON ORAL POTASSIUM--MOST RECENT LAB 11/24/11 POTASSIUM IMPROVED TO 3.1  . History of radiation therapy 09/29/11-11/07/11    rectal ca  . Nasal congestion   . COPD (chronic obstructive pulmonary disease)   . Heart murmur     SINCE BIRTH--DOES NOT CAUSE ANY PROBLEMS  . Dyspnea     no recent bronchitis  . Recurrent upper respiratory infection (URI)     HEAD COLD   . Sleep apnea     stop-bang score =6 (NO SLEEP STUDY DONE YET)  . No pertinent past medical history     2 SPOTS ON LIVER JUST WATCHING  . Hydronephrosis of right  kidney     "Chronic" -SECONDARY TO CALCULUS (STENT RT URETER) MID RT URETER AND ADDITIONAL CALCULI WITHIN RT KIDNEY--PT STATES HE HAS PASSED A STONE IN THE PAST--DID NOT KNOW HE HAD MORE STONES UNTIL CT ABD DONE ON 09/03/11  . Rectal cancer     HAS COMPLETED RADIATION AND XELODA--PLAN RESTAGING TESTING.  [name]  . Cancer     rectal adenocarcinoma stage T3NxMx  . Hydroureter, right   . Nephrolithiasis     Past Surgical History Past Surgical History  Procedure Date  . Wrist and hand surgery yrs ago    LEFT WRIST AND RIGHT HAND--GANGLION LW  AND FX OF RT HAS  . Right hand surgery -repair fracture yrs ago  . Removal ganglion cyst yrs ago  . Cystoscopy with stent placment jan 2013  . Appendectomy 02/06/2012    Procedure: APPENDECTOMY;  Surgeon: [name] Cocking, MD;  Location: [facility];  Service: General;;  . Laparotomy 02/06/2012    Procedure: EXPLORATORY LAPAROTOMY;  Surgeon: [name] Cocking, MD;  Location: [facility];  Service: General;;  . Ureterolithotomy 02/06/2012    Procedure: URETEROLITHOTOMY;  Surgeon: [name] Slough, MD;  Location: [facility];  Service: Urology;  Laterality: Right;  open exploration of right ureter, double J stent exchange, attempted ureteroscopy  . Colostomy   . Portacath placement 03/10/2012    Procedure: INSERTION PORT-A-CATH;  Surgeon: [name] Cocking, MD;  Location: [facility] OR;  Service: General;  Laterality: N/A;  . Colon surgery     Meds: Prior to Admission medications   Medication Sig Start Date End Date Taking? Authorizing Provider  amLODipine (NORVASC) 5 MG tablet TAKE 1 TABLET DAILY 04/03/12   [name] Glaze, MD  bisoprolol-hydrochlorothiazide [facility]) 5-6.25 MG per tablet TAKE 1 TABLET DAILY 04/03/12   [name] Glaze, MD  lidocaine-prilocaine (EMLA) cream Apply topically as needed. Apply to [facility] site 1 hour prior to stick and cover with plastic wrap 03/16/12   [name] Artist, MD  loperamide (IMODIUM) 2 MG capsule Take 2 mg by mouth  4 (four) times daily as needed. Maximum 8/day    Historical Provider, MD  oxyCODONE-acetaminophen (PERCOCET) 5-325 MG per tablet Take 1-2 tablets by mouth every 6 (six) hours as needed. Pain  11/20/11   [name] Snare, NP  potassium chloride SA (K-DUR,KLOR-CON) 20 MEQ tablet Take 1 tablet (20 mEq total) by mouth 2 (two) times daily. 04/19/12   [name] Artist, MD  prochlorperazine (COMPAZINE) 10 MG tablet Take 1 tablet (10 mg total) by mouth every 6 (six) hours as needed (nausea). 03/16/12   [name] Artist, MD    Allergies: Review of patient's allergies indicates no known allergies.  Social History: History   Social History  . Marital Status: Married    Spouse Name: N/A    Number of Children: N/A  . Years of Education: N/A   Occupational History  . Not on file.   Social History Main Topics  . Smoking status: Current Everyday Smoker -- 1.0 packs/day for 46 years    Types: Cigarettes  . Smokeless tobacco: Never Used  . Alcohol Use: No  . Drug Use: No  . Sexually Active: Yes   Other Topics Concern  . Not on file   Social History Narrative  . No narrative on file    Family History:  Family History  Problem Relation Age of Onset  . Lupus    . Anemia Mother   . Hypertension Father   . Kidney disease Father 21    renal failure  . Lupus Sister   . Lupus Brother     Review of Systems: Constitutional: No fever, no chills;  Appetite fair; + weight loss (12 lbs in 6 months), no weight gain.  HEENT: No blurry vision, no diplopia, no pharyngitis, no dysphagia CV: No chest pain, no palpitations.  Resp: + SOB, + cough productive of clear mucous. GI: +nausea, no vomiting, +high output liquid stool in ileostomy, no melena, no hematochezia.  GU: No dysuria, no hematuria.  MSK: no myalgias, no arthralgias.  Neuro:  No headache, no focal neurological deficits, no history of seizures.  Psych: No depression, no anxiety.  Endo: No thyroid disease, no DM, no heat intolerance, + cold  intolerance, no polyuria, + polydipsia  Skin: No rashes, no skin lesions.  Heme: No easy bruising, no history of blood diseases.   Physical Exam: Blood pressure 103/65, pulse 96, temperature 98 F (36.7 C), temperature source Oral, resp. rate 19, height 5\' 10"  (1.778 m), weight 67.586 kg (149 lb), SpO2 100.00%. BP 103/65  Pulse 96  Temp(Src) 98 F (36.7 C) (Oral)  Resp 19  Ht 5\' 10"  (1.778 m)  Wt 67.586 kg (149 lb)  BMI 21.38 kg/m2  SpO2 100%  General Appearance:    Alert, cooperative, no distress, appears stated age  Head:    Normocephalic, without obvious abnormality, atraumatic  Eyes:    PERRL, conjunctiva/corneas clear, EOM's intact, arcus senilis  bilaterally  Ears:    Normal external ear canals, both ears  Nose:   Nares normal, septum midline, mucosa normal, no drainage    or sinus tenderness  Throat:   Lips, mucosa, dry with white tongue coating; teeth and gums normal  Neck:   Supple, symmetrical, trachea midline, no adenopathy;       thyroid:  No enlargement/tenderness/nodules; no carotid   bruit or JVD  Back:     Symmetric, no curvature, ROM normal, no CVA tenderness  Lungs:     Clear to auscultation bilaterally, respirations unlabored  Chest wall:    No tenderness or deformity  Heart:    Regular rate and rhythm, S1 and S2 normal, no murmur, rub   or gallop  Abdomen:     Soft, non-tender, bowel sounds active all four quadrants,    no masses, no organomegaly  Extremities:   Extremities normal, atraumatic, no cyanosis or edema  Pulses:   2+ and symmetric all extremities  Skin:   Skin color, texture, turgor normal, no rashes or lesions  Lymph nodes:   Cervical, supraclavicular, and axillary nodes normal  Neurologic:   Non-focal   Lab results: Basic Metabolic Panel:  Lab 04/30/12 2956 04/29/12 1445 04/26/12 0847  NA 131* 131* 136  K 5.1 4.8 --  CL 109 105 110  CO2 9* 10* 12*  GLUCOSE 119* 116* 127*  BUN 78* 77* 55*  CREATININE 3.95* 4.26* 3.10*  CALCIUM 8.6 9.3 9.2   MG -- -- --  PHOS -- -- --   GFR Estimated Creatinine Clearance: 18.3 ml/min (by C-G formula based on Cr of 3.95). Liver Function Tests:  Lab 04/26/12 0847  AST 14  ALT 13  ALKPHOS 104  BILITOT 0.4  PROT 7.3  ALBUMIN 4.1   CBC:  Lab 04/26/12 0847  WBC 7.9  NEUTROABS 6.8*  HGB 11.1*  HCT 31.6*  MCV 101.6*  PLT 165    Imaging results:  No results found.  Assessment & Plan: Principal Problem:  *ARF (acute renal failure) secondary to high output ileostomy / dehydration  Continue IVF (D5W with 150 mEq of sodium bicarb at 100 cc/hr)  Monitor BUN, creatinine and electrolytes. Active Problems:  CIGARETTE SMOKER  Tobacco cessation education nursing staff.  HYPERTENSION  Blood pressure on the low side. We'll hold all antihypertensives for now.  Rectal cancer, approx. 11 cm from anal verge, s/p LAR and loop ileostomy 02/06/12  [name] [name] is aware of the patient's admission. Ultimate plan is to reverse his ileostomy.  Metabolic acidosis  Secondary to bicarbonate loss in the stool. Replace intravenously.  Hyponatremia  Mild, secondary to dehydration, will monitor.  Prophylaxis: Lovenox for DVT prophylaxis.  Time Spent On Admission: 1 hour  Orlandria Kissner 04/30/2012, 5:34 PM Pager [phone]

## 2012-05-01 ENCOUNTER — Ambulatory Visit: Payer: 59

## 2012-05-01 DIAGNOSIS — N179 Acute kidney failure, unspecified: Secondary | ICD-10-CM

## 2012-05-01 DIAGNOSIS — C210 Malignant neoplasm of anus, unspecified: Secondary | ICD-10-CM

## 2012-05-01 DIAGNOSIS — E872 Acidosis: Secondary | ICD-10-CM

## 2012-05-01 DIAGNOSIS — R42 Dizziness and giddiness: Secondary | ICD-10-CM

## 2012-05-01 LAB — CBC
Hemoglobin: 6.7 g/dL — CL (ref 13.0–17.0)
MCH: 36.2 pg — ABNORMAL HIGH (ref 26.0–34.0)
MCHC: 35.8 g/dL (ref 30.0–36.0)
MCV: 100.8 fL — ABNORMAL HIGH (ref 78.0–100.0)
Platelets: 139 10*3/uL — ABNORMAL LOW (ref 150–400)
RBC: 2.47 MIL/uL — ABNORMAL LOW (ref 4.22–5.81)
RDW: 16.3 % — ABNORMAL HIGH (ref 11.5–15.5)
RDW: 16.1 % — ABNORMAL HIGH (ref 11.5–15.5)
WBC: 6.9 10*3/uL (ref 4.0–10.5)

## 2012-05-01 LAB — BASIC METABOLIC PANEL
BUN: 79 mg/dL — ABNORMAL HIGH (ref 6–23)
Calcium: 8.2 mg/dL — ABNORMAL LOW (ref 8.4–10.5)
Creatinine, Ser: 3.78 mg/dL — ABNORMAL HIGH (ref 0.50–1.35)
GFR calc Af Amer: 18 mL/min — ABNORMAL LOW (ref >90)
GFR calc non Af Amer: 16 mL/min — ABNORMAL LOW (ref >90)
Glucose, Bld: 117 mg/dL — ABNORMAL HIGH (ref 70–99)
Potassium: 3.6 mEq/L (ref 3.5–5.1)

## 2012-05-01 MED ORDER — MENTHOL 3 MG MT LOZG
1.0000 | LOZENGE | OROMUCOSAL | Status: DC | PRN
  Administered 2012-05-01: 3 mg via ORAL
  Filled 2012-05-01: qty 9

## 2012-05-01 MED ORDER — SODIUM BICARBONATE 8.4 % IV SOLN
INTRAVENOUS | Status: DC
  Administered 2012-05-01 – 2012-05-02 (×4): via INTRAVENOUS
  Filled 2012-05-01 (×6): qty 1000

## 2012-05-01 MED ORDER — SODIUM BICARBONATE 8.4 % IV SOLN: INTRAVENOUS | Status: DC

## 2012-05-01 NOTE — Progress Notes (Signed)
PROGRESS NOTE  [name] [name] ZOX:[id number] DOB: 1951-07-25 DOA: 04/30/2012 PCP: Carrie Mew, MD, MD  Brief narrative: [name] [name] is an 61 y.o. male with a PMH of rectal cancer diagnosed back in September of 2012, status post neoadjuvant chemotherapy and resection of tumor with diverting ileostomy on 02/06/12 whose postoperative course has been complicated by high ileostomy output and consequent renal failure. He was admitted for IVF with bicarbonate for treatment of worsening renal function and metabolic acidosis.   Assessment/Plan: Principal Problem:  *ARF (acute renal failure) secondary to high output ileostomy / dehydration  Continue IVF (D5W with 150 mEq of sodium bicarb at 100 cc/hr).  Add 20 mEq KCL today. Monitor BUN, creatinine and electrolytes. Active Problems:  CIGARETTE SMOKER  Tobacco cessation education nursing staff. HYPERTENSION  Blood pressure on the low side. All antihypertensives remain on hold. Rectal cancer, approx. 11 cm from anal verge, s/p LAR and loop ileostomy 02/06/12  Dr. Truett Perna is aware of the patient's admission. Ultimate plan is to reverse his ileostomy. Metabolic acidosis  Secondary to bicarbonate loss in the stool. Replacing intravenously. Hyponatremia  Mild, secondary to dehydration, will monitor.     Code Status: Full Family Communication: Wife updated at bedside. Disposition Plan: Home, when stable.  Medical Consultants:  None  Other consultants:  None  Antibiotics:  None   Subjective  [name] [name] is without complaint today.   Objective    Interim History: Stable overnight.  Hgb low this a.m.  Improved by 2.5 mg/dL on re-check.   Objective: Filed Vitals:   04/30/12 1633 04/30/12 2120 05/01/12 0415  BP: 103/65 121/71 105/63  Pulse: 96 83 80  Temp: 98 F (36.7 C) 98.1 F (36.7 C) 98.5 F (36.9 C)  TempSrc: Oral Oral Oral  Resp: 19 20 18   Height: 5\' 10"  (1.778 m)    Weight: 67.586 kg (149 lb)    SpO2: 100% 99%  99%    Intake/Output Summary (Last 24 hours) at 05/01/12 1150 Last data filed at 05/01/12 0300  Gross per 24 hour  Intake    715 ml  Output    375 ml  Net    340 ml    Exam: Gen:  NAD Cardiovascular:  RRR, No M/R/G Respiratory: Lungs CTAB Gastrointestinal: Abdomen soft, NT/ND with normal active bowel sounds.  Ileostomy site with pink stoma.  Liquid brown stool in bag. Extremities: No C/E/C    Data Reviewed: Basic Metabolic Panel:  Lab 05/01/12 8119 04/30/12 1830 04/30/12 1237 04/29/12 1445 04/26/12 0847  NA 131* -- 131* 131* 136  K 3.6 -- 5.1 -- --  CL 104 -- 109 105 110  CO2 13* -- 9* 10* 12*  GLUCOSE 117* -- 119* 116* 127*  BUN 79* -- 78* 77* 55*  CREATININE 3.78* 3.94* 3.95* 4.26* 3.10*  CALCIUM 8.2* -- 8.6 9.3 9.2  MG -- -- -- -- --  PHOS -- -- -- -- --   GFR Estimated Creatinine Clearance: 19.1 ml/min (by C-G formula based on Cr of 3.78). Liver Function Tests:  Lab 04/26/12 0847  AST 14  ALT 13  ALKPHOS 104  BILITOT 0.4  PROT 7.3  ALBUMIN 4.1   CBC:  Lab 05/01/12 0800 05/01/12 0700 04/30/12 1830 04/26/12 0847  WBC 6.9 5.8 8.5 7.9  NEUTROABS -- -- -- 6.8*  HGB 9.1* 6.7* 10.1* 11.1*  HCT 24.9* 18.7* 28.2* 31.6*  MCV 100.8* 101.1* 102.5* 101.6*  PLT 139* 104* 163 165    Procedures and Diagnostic Studies: No results found.  Scheduled Meds:   . enoxaparin (LOVENOX) injection  30 mg Subcutaneous Q24H  . DISCONTD: enoxaparin  40 mg Subcutaneous Q24H   Continuous Infusions:   . dextrose 5 % 1,000 mL with potassium chloride 20 mEq, sodium bicarbonate 150 mEq infusion 125 mL/hr at 05/01/12 1110  . DISCONTD:  sodium bicarbonate infusion 1000 mL 100 mL/hr at 05/01/12 0344  . DISCONTD:  sodium bicarbonate infusion 1000 mL Stopped (05/01/12 1110)      LOS: 1 day   Hillery Aldo, MD Pager [phone]  05/01/2012, 11:50 AM   Patient teaching/information given to the patient and reviewed with the patient:  [name] [name] 05/01/2012 1 (Number  of days in the hospital)  Treatment team:  Dr. Hillery Aldo, Hospitalist (Internist)  Dr. Mancel Bale, Oncologist  Dr. Ovidio Kin, Surgeon  Active Treatment Issues with Plan: Principal Problem:  *Kidney failure secondary to high output ileostomy / dehydration  Continue IV fluids with bicarbonate.  Add potassium to IVF today. Monitor kidney function and electrolytes. Active Problems:  CIGARETTE SMOKER  We encourage you to quit. History of high blood pressure Blood pressure on the low side. All blood pressure medicines remain on hold. Rectal cancer Dr. Truett Perna is aware of your  admission. Ultimate plan is for surgeon to reverse your ileostomy. Acidic blood  Secondary to bicarbonate loss in the stool. Replacing intravenously. Low sodium levels  Mild, secondary to dehydration, will monitor.   Significant Lab results:  Lab   05/01/12 0700 04/30/12 1830 04/30/12  Sodium  131* -- 131* 131* 136 Potassium  3.6 -- 5.1 -- -- Bicarbonate  13* -- 9* 10* 12* GLUCOSE  117* -- 119* 116* 127* BUN   79* -- 78* 77* 55* CREATININE  3.78* 3.94* 3.95* 4.26* 3.10*  Lab 05/01/12 0800 05/01/12 0700 04/30/12 1830 04/26/12 0847  White blood cells 6.9 5.8 8.5 7.9  Hemoglobin 9.1* 6.7* 10.1* 11.1*  Platelets 139* 104* 163 165    Anticipated discharge date:  Depends on progress, timing of surgery.

## 2012-05-02 DIAGNOSIS — N179 Acute kidney failure, unspecified: Secondary | ICD-10-CM

## 2012-05-02 DIAGNOSIS — C210 Malignant neoplasm of anus, unspecified: Secondary | ICD-10-CM

## 2012-05-02 DIAGNOSIS — R42 Dizziness and giddiness: Secondary | ICD-10-CM

## 2012-05-02 DIAGNOSIS — E872 Acidosis: Secondary | ICD-10-CM

## 2012-05-02 LAB — BASIC METABOLIC PANEL
Chloride: 102 mEq/L (ref 96–112)
GFR calc Af Amer: 30 mL/min — ABNORMAL LOW (ref >90)
GFR calc non Af Amer: 26 mL/min — ABNORMAL LOW (ref >90)
Glucose, Bld: 150 mg/dL — ABNORMAL HIGH (ref 70–99)
Potassium: 3.3 mEq/L — ABNORMAL LOW (ref 3.5–5.1)
Sodium: 139 mEq/L (ref 135–145)

## 2012-05-02 LAB — CBC
Hemoglobin: 7.7 g/dL — ABNORMAL LOW (ref 13.0–17.0)
MCHC: 35.8 g/dL (ref 30.0–36.0)
RDW: 16.2 % — ABNORMAL HIGH (ref 11.5–15.5)
WBC: 5.5 10*3/uL (ref 4.0–10.5)

## 2012-05-02 MED ORDER — POTASSIUM CHLORIDE 2 MEQ/ML IV SOLN
INTRAVENOUS | Status: DC
  Administered 2012-05-02 – 2012-05-03 (×2): via INTRAVENOUS
  Filled 2012-05-02 (×4): qty 1000

## 2012-05-02 MED ORDER — SODIUM BICARBONATE 8.4 % IV SOLN: INTRAVENOUS | Status: DC

## 2012-05-02 NOTE — Progress Notes (Addendum)
PROGRESS NOTE  [name] [name] MWU:[id number] DOB: 06/26/1951 DOA: 04/30/2012 PCP: Carrie Mew, MD, MD  Brief narrative: [name] [name] is an 61 y.o. male with a PMH of rectal cancer diagnosed back in September of 2012, status post neoadjuvant chemotherapy and resection of tumor with diverting ileostomy on 02/06/12 whose postoperative course has been complicated by high ileostomy output and consequent renal failure. He was admitted for IVF with bicarbonate for treatment of worsening renal function and metabolic acidosis.   Assessment/Plan: Principal Problem:  *ARF (acute renal failure) secondary to high output ileostomy / dehydration  Continue IVF (D5W with 150 mEq of sodium bicarb + 20 mEq KCL at 100 cc/hr). Continue to monitor BUN, creatinine and electrolytes. Creatinine slowly trending down over time. Active Problems:  CIGARETTE SMOKER  Tobacco cessation education nursing staff. HYPERTENSION  Blood pressure remains on the low side. Continue to hold antihypertensives. Rectal cancer, approx. 11 cm from anal verge, s/p LAR and loop ileostomy 02/06/12  Dr. Truett Perna is aware of the patient's admission. Ultimate plan is to reverse his ileostomy. Metabolic acidosis  Secondary to bicarbonate loss in the stool. Replacing intravenously. Slowly trending up. Hyponatremia  Mild, secondary to dehydration, will monitor.  Code Status: Full Family Communication: Wife updated at bedside. Disposition Plan: Home, when stable.  Medical Consultants:  None  Other consultants:  None  Antibiotics:  None   Subjective  [name] [name] is without complaint today. Eating well with no complaints of nausea, vomiting, or pain.   Objective    Interim History: Stable overnight.   Objective: Filed Vitals:   05/01/12 0415 05/01/12 1446 05/01/12 2054 05/02/12 0515  BP: 105/63 104/63 112/57 106/54  Pulse: 80 82 84 81  Temp: 98.5 F (36.9 C) 98.5 F (36.9 C) 98.7 F (37.1 C) 98.3 F (36.8 C)    TempSrc: Oral Oral Oral Oral  Resp: 18 18 20 16   Height:      Weight:      SpO2: 99% 99% 100% 98%    Intake/Output Summary (Last 24 hours) at 05/02/12 0732 Last data filed at 05/02/12 0448  Gross per 24 hour  Intake   2140 ml  Output   2275 ml  Net   -135 ml    Exam: Gen:  NAD Cardiovascular:  RRR, No M/R/G Respiratory: Lungs CTAB Gastrointestinal: Abdomen soft, NT/ND with normal active bowel sounds.  Ileostomy site with pink stoma.  Liquid brown stool in bag. Extremities: No C/E/C    Data Reviewed: Basic Metabolic Panel:  Lab 05/01/12 7253 04/30/12 1830 04/30/12 1237 04/29/12 1445 04/26/12 0847  NA 131* -- 131* 131* 136  K 3.6 -- 5.1 -- --  CL 104 -- 109 105 110  CO2 13* -- 9* 10* 12*  GLUCOSE 117* -- 119* 116* 127*  BUN 79* -- 78* 77* 55*  CREATININE 3.78* 3.94* 3.95* 4.26* 3.10*  CALCIUM 8.2* -- 8.6 9.3 9.2  MG -- -- -- -- --  PHOS -- -- -- -- --   GFR Estimated Creatinine Clearance: 19.1 ml/min (by C-G formula based on Cr of 3.78). Liver Function Tests:  Lab 04/26/12 0847  AST 14  ALT 13  ALKPHOS 104  BILITOT 0.4  PROT 7.3  ALBUMIN 4.1   CBC:  Lab 05/01/12 0800 05/01/12 0700 04/30/12 1830 04/26/12 0847  WBC 6.9 5.8 8.5 7.9  NEUTROABS -- -- -- 6.8*  HGB 9.1* 6.7* 10.1* 11.1*  HCT 24.9* 18.7* 28.2* 31.6*  MCV 100.8* 101.1* 102.5* 101.6*  PLT 139* 104* 163 165  Procedures and Diagnostic Studies: No results found.  Scheduled Meds:    . enoxaparin (LOVENOX) injection  30 mg Subcutaneous Q24H   Continuous Infusions:    . dextrose 5 % 1,000 mL with potassium chloride 20 mEq, sodium bicarbonate 150 mEq infusion 125 mL/hr at 05/02/12 0448  . DISCONTD:  sodium bicarbonate infusion 1000 mL 100 mL/hr at 05/01/12 0344  . DISCONTD:  sodium bicarbonate infusion 1000 mL Stopped (05/01/12 1110)      LOS: 2 days   Hillery Aldo, MD Pager [phone]  05/02/2012, 7:32 AM   Patient teaching/information given to the patient and reviewed  with the patient:  [name] [name] 05/02/2012 2 (Number of days in the hospital)  Treatment team:  Dr. Hillery Aldo, Hospitalist (Internist)  Dr. Mancel Bale, Oncologist  Dr. Ovidio Kin, Surgeon  Active Treatment Issues with Plan: Principal Problem:  *Kidney failure secondary to high output ileostomy / dehydration  Continue IV fluids with bicarbonate and potassium. Kidney function slowly improving. Active Problems:  CIGARETTE SMOKER  We encourage you to quit. History of high blood pressure Blood pressure on the low side. All blood pressure medicines remain on hold. Rectal cancer Dr. Truett Perna is aware of your  admission. Ultimate plan is for surgeon to reverse your ileostomy. Acidic blood  Secondary to bicarbonate loss in the stool. Replacing intravenously with slow improvement of bicarbonate values. Low sodium levels  Mild, secondary to dehydration, will monitor.   Significant Lab results: Lab 05/01/12 0700 04/30/12 1830 04/30/12 1237 04/29/12 1445 04/26/12 0847  Sodium 131* -- 131* 131* 136  [place] 3.6 -- 5.1 -- --  Bicarbonate 13* -- 9* 10* 12*  GLUCOSE 117* -- 119* 116* 127*  BUN 79* -- 78* 77* 55*  CREATININE 3.78* 3.94* 3.95* 4.26* 3.10*    Lab 05/01/12 0800 05/01/12 0700 04/30/12 1830 04/26/12 0847  White blood cells 6.9 5.8 8.5 7.9  Hemoglobin 9.1* 6.7* 10.1* 11.1*  Platelets 139* 104* 163 165    Anticipated discharge date:  Depends on progress, timing of surgery.

## 2012-05-03 ENCOUNTER — Inpatient Hospital Stay (HOSPITAL_COMMUNITY): Payer: 59

## 2012-05-03 ENCOUNTER — Other Ambulatory Visit: Payer: Self-pay

## 2012-05-03 DIAGNOSIS — R42 Dizziness and giddiness: Secondary | ICD-10-CM

## 2012-05-03 DIAGNOSIS — N179 Acute kidney failure, unspecified: Secondary | ICD-10-CM

## 2012-05-03 DIAGNOSIS — C210 Malignant neoplasm of anus, unspecified: Secondary | ICD-10-CM

## 2012-05-03 DIAGNOSIS — E872 Acidosis: Secondary | ICD-10-CM

## 2012-05-03 DIAGNOSIS — Z862 Personal history of diseases of the blood and blood-forming organs and certain disorders involving the immune mechanism: Secondary | ICD-10-CM | POA: Diagnosis present

## 2012-05-03 LAB — PROTIME-INR: INR: 1.15 (ref 0.00–1.49)

## 2012-05-03 LAB — COMPREHENSIVE METABOLIC PANEL
ALT: 8 U/L (ref 0–53)
AST: 11 U/L (ref 0–37)
Albumin: 3.1 g/dL — ABNORMAL LOW (ref 3.5–5.2)
CO2: 31 mEq/L (ref 19–32)
Calcium: 7.7 mg/dL — ABNORMAL LOW (ref 8.4–10.5)
Creatinine, Ser: 2.1 mg/dL — ABNORMAL HIGH (ref 0.50–1.35)
Sodium: 141 mEq/L (ref 135–145)
Total Protein: 5.8 g/dL — ABNORMAL LOW (ref 6.0–8.3)

## 2012-05-03 LAB — APTT: aPTT: 40 seconds — ABNORMAL HIGH (ref 24–37)

## 2012-05-03 LAB — BASIC METABOLIC PANEL
CO2: 32 mEq/L (ref 19–32)
Chloride: 97 mEq/L (ref 96–112)
GFR calc Af Amer: 39 mL/min — ABNORMAL LOW (ref >90)
Potassium: 4.1 mEq/L (ref 3.5–5.1)

## 2012-05-03 MED ORDER — POTASSIUM CHLORIDE IN NACL 40-0.9 MEQ/L-% IV SOLN
INTRAVENOUS | Status: DC
  Administered 2012-05-03 (×2): via INTRAVENOUS
  Filled 2012-05-03 (×4): qty 1000

## 2012-05-03 MED ORDER — SODIUM BICARBONATE 650 MG PO TABS: 650.0000 mg | ORAL_TABLET | Freq: Three times a day (TID) | ORAL | Status: DC

## 2012-05-03 MED ORDER — CHLORHEXIDINE GLUCONATE 4 % EX LIQD
1.0000 "application " | Freq: Once | CUTANEOUS | Status: AC
  Administered 2012-05-03: 1 via TOPICAL
  Filled 2012-05-03: qty 60
  Filled 2012-05-03: qty 15

## 2012-05-03 MED ORDER — CHLORHEXIDINE GLUCONATE 4 % EX LIQD
1.0000 "application " | Freq: Once | CUTANEOUS | Status: AC
  Administered 2012-05-04: 1 via TOPICAL
  Filled 2012-05-03: qty 15

## 2012-05-03 MED ORDER — ENOXAPARIN SODIUM 40 MG/0.4ML ~~LOC~~ SOLN
40.0000 mg | SUBCUTANEOUS | Status: DC
  Administered 2012-05-03 – 2012-05-04 (×2): 40 mg via SUBCUTANEOUS
  Filled 2012-05-03 (×3): qty 0.4

## 2012-05-03 MED ORDER — DIATRIZOATE MEGLUMINE 30 % UR SOLN
Freq: Once | URETHRAL | Status: AC | PRN
  Administered 2012-05-03: 900 mL

## 2012-05-03 NOTE — Progress Notes (Addendum)
  Subjective: Feels good wants to go home  Objective: Vital signs in last 24 hours: Temp:  [98.4 F (36.9 C)-99.3 F (37.4 C)] 98.4 F (36.9 C) (05/27 0603) Pulse Rate:  [73-92] 73  (05/27 0603) Resp:  [16-18] 16  (05/27 0603) BP: (101-115)/(57-62) 115/57 mmHg (05/27 0603) SpO2:  [99 %-100 %] 99 % (05/27 0603) Last BM Date: 05/02/12  Intake/Output from previous day: 05/26 0701 - 05/27 0700 In: 2579.2 [P.O.:1200; I.V.:1379.2] Out: 2400 [Urine:2400] Intake/Output this shift:      Lab Results:   Basename 05/02/12 1110 05/01/12 0800  WBC 5.5 6.9  HGB 7.7* 9.1*  HCT 21.5* 24.9*  PLT 129* 139*   BMET  Basename 05/03/12 0630 05/02/12 1110  NA 135 139  K 4.1 3.3*  CL 97 102  CO2 32 27  GLUCOSE 154* 150*  BUN 40* 59*  CREATININE 1.99* 2.52*  CALCIUM 7.2* 7.6*     Assessment/Plan: Rectal ca, ileostomy  Dr. Ezzard Standing plans takedown ileostomy but needs be before this.  His cr is better and patient really wants to go home today Will let Dr. Ezzard Standing know and will need to plan as outpatient now.   Addendum: he and I have discussed again and he is willing to stay for surgery early this week   LOS: 3 days    [facility] 05/03/2012

## 2012-05-03 NOTE — Plan of Care (Signed)
Problem: Phase II Progression Outcomes Goal: Discharge plan established Outcome: Completed/Met Date Met:  05/03/12 To be discharged after ileostomy is reversed

## 2012-05-03 NOTE — Progress Notes (Addendum)
While assisting another patient in discharge at the curbside, this nurse found [name] [name] sitting outside of the main building entrance on a park bench smoking a cigarette.  This nurse explained to patient that it is against hospital policy for patients to go outside, and that a doctor's orders are needed permitting a patient to go off the floor.  In any event, smoking is prohibited.  Patient snuffed out his cigarette and returned to the 3rd floor.  Will continue to monitor.

## 2012-05-03 NOTE — Discharge Summary (Addendum)
Physician Discharge Summary  Patient ID: [name] [name] MRN: [id number] DOB/AGE: Apr 02, 1951 61 y.o.  Admit date: 04/30/2012 Discharge date: 05/03/2012  Primary Care Physician:  Carrie Mew, MD, MD   Discharge Diagnoses:    Present on Admission:  .ARF (acute renal failure) .Metabolic acidosis .Hyponatremia .HYPERTENSION .CIGARETTE SMOKER .Dehydration .Rectal cancer, approx. 11 cm from anal verge, s/p LAR and loop ileostomy 02/06/12 .Macrocytic anemia  Discharge Medications:  Medication List  As of 05/03/2012  7:53 AM   STOP taking these medications         amLODipine 5 MG tablet      bisoprolol-hydrochlorothiazide 5-6.25 MG per tablet         TAKE these medications         lidocaine-prilocaine cream   Commonly known as: EMLA   Apply topically as needed. Apply to [facility] site 1 hour prior to stick and cover with plastic wrap      loperamide 2 MG capsule   Commonly known as: IMODIUM   Take 2 mg by mouth 4 (four) times daily as needed. Maximum 8/day      oxyCODONE-acetaminophen 5-325 MG per tablet   Commonly known as: PERCOCET   Take 1-2 tablets by mouth every 6 (six) hours as needed. Pain        potassium chloride SA 20 MEQ tablet   Commonly known as: K-DUR,KLOR-CON   Take 1 tablet (20 mEq total) by mouth 2 (two) times daily.      sodium bicarbonate 650 MG tablet   Take 1 tablet (650 mg total) by mouth 3 (three) times daily.             Disposition and Follow-up: The patient is being discharged home.  He is instructed to F/U with Dr. Truett Perna on 05/04/12.   Procedures and Diagnostic Studies:  No results found.  Discharge Laboratory Values: Basic Metabolic Panel:  Lab 05/03/12 7829 05/02/12 1110 05/01/12 0700 04/30/12 1830 04/30/12 1237 04/29/12 1445  NA 135 139 131* -- 131* 131*  K 4.1 3.3* -- -- -- --  CL 97 102 104 -- 109 105  CO2 32 27 13* -- 9* 10*  GLUCOSE 154* 150* 117* -- 119* 116*  BUN 40* 59* 79* -- 78* 77*  CREATININE 1.99* 2.52*  3.78* 3.94* 3.95* --  CALCIUM 7.2* 7.6* 8.2* -- 8.6 9.3  MG -- -- -- -- -- --  PHOS -- -- -- -- -- --   GFR Estimated Creatinine Clearance: 36.3 ml/min (by C-G formula based on Cr of 1.99). Liver Function Tests:  Lab 04/26/12 0847  AST 14  ALT 13  ALKPHOS 104  BILITOT 0.4  PROT 7.3  ALBUMIN 4.1   CBC:  Lab 05/02/12 1110 05/01/12 0800 05/01/12 0700 04/30/12 1830 04/26/12 0847  WBC 5.5 6.9 5.8 8.5 7.9  NEUTROABS -- -- -- -- 6.8*  HGB 7.7* 9.1* 6.7* 10.1* 11.1*  HCT 21.5* 24.9* 18.7* 28.2* 31.6*  MCV 100.9* 100.8* 101.1* 102.5* 101.6*  PLT 129* 139* 104* 163 165    Brief H and P: For complete details please refer to admission H and P, but in brief, [name] [name] is an 61 y.o. male with a PMH of rectal cancer diagnosed back in September of 2012, status post neoadjuvant chemotherapy and resection of tumor with diverting ileostomy on 02/06/12 whose postoperative course has been complicated by high ileostomy output and consequent renal failure. He was admitted for IVF with bicarbonate for treatment of worsening renal function and metabolic acidosis.   Physical  Exam at Discharge: BP 115/57  Pulse 73  Temp(Src) 98.4 F (36.9 C) (Oral)  Resp 16  Ht 5\' 10"  (1.778 m)  Wt 67.586 kg (149 lb)  BMI 21.38 kg/m2  SpO2 99% Gen:  NAD Cardiovascular:  RRR, No M/R/G Respiratory: Lungs CTAB Gastrointestinal: Abdomen soft, NT/ND with normal active bowel sounds. Extremities: No C/E/C  Hospital Course:  Principal Problem:  *ARF (acute renal failure) secondary to high output ileostomy / dehydration  Improves status post IVF containing bicarbonate and KCL.  Creatinine slowly trending down over time. Active Problems:  CIGARETTE SMOKER  Tobacco cessation education provided by nursing staff. HYPERTENSION  The patient states he has not taken BP medication in over a month. Continue to hold at discharge, as his BP will likely decline in the setting of high output ileostomy. Rectal cancer,  approx. 11 cm from anal verge, s/p LAR and loop ileostomy 02/06/12  Dr. Truett Perna is aware of the patient's admission. Ultimate plan is to reverse his ileostomy. Metabolic acidosis  Secondary to bicarbonate loss in the stool. Replaced IV.  Will d/c on oral bicarbonate tablets. Hyponatremia  Resolved. Macrocytic anemia  Likely, in part, dilutional.  No evidence of GIB (ileostomy output not black or bloody)  Diet:  Regular  Activity:  Increase activity slowly  Condition at Discharge:   Improved  Time spent on Discharge:  25 minutes  Signed: Dr. Trula Ore Gerasimos Plotts Pager [phone] 05/03/2012, 7:53 AM

## 2012-05-04 DIAGNOSIS — IMO0002 Reserved for concepts with insufficient information to code with codable children: Secondary | ICD-10-CM

## 2012-05-04 LAB — BASIC METABOLIC PANEL
Calcium: 8 mg/dL — ABNORMAL LOW (ref 8.4–10.5)
GFR calc Af Amer: 41 mL/min — ABNORMAL LOW (ref >90)
GFR calc non Af Amer: 35 mL/min — ABNORMAL LOW (ref >90)
Glucose, Bld: 96 mg/dL (ref 70–99)
Potassium: 4.7 mEq/L (ref 3.5–5.1)
Sodium: 139 mEq/L (ref 135–145)

## 2012-05-04 LAB — DIFFERENTIAL
Basophils Absolute: 0 10*3/uL (ref 0.0–0.1)
Eosinophils Relative: 1 % (ref 0–5)
Lymphocytes Relative: 9 % — ABNORMAL LOW (ref 12–46)
Lymphs Abs: 0.5 10*3/uL — ABNORMAL LOW (ref 0.7–4.0)
Monocytes Absolute: 0.2 10*3/uL (ref 0.1–1.0)
Neutro Abs: 4.9 10*3/uL (ref 1.7–7.7)

## 2012-05-04 LAB — CBC
HCT: 24 % — ABNORMAL LOW (ref 39.0–52.0)
Hemoglobin: 8.4 g/dL — ABNORMAL LOW (ref 13.0–17.0)
MCH: 37.5 pg — ABNORMAL HIGH (ref 26.0–34.0)
MCHC: 35 g/dL (ref 30.0–36.0)
MCV: 107.1 fL — ABNORMAL HIGH (ref 78.0–100.0)
Platelets: 149 10*3/uL — ABNORMAL LOW (ref 150–400)
RBC: 2.24 MIL/uL — ABNORMAL LOW (ref 4.22–5.81)
RDW: 15.3 % (ref 11.5–15.5)
WBC: 5.7 10*3/uL (ref 4.0–10.5)

## 2012-05-04 LAB — CEA: CEA: 2.1 ng/mL (ref 0.0–5.0)

## 2012-05-04 MED ORDER — KCL IN DEXTROSE-NACL 20-5-0.45 MEQ/L-%-% IV SOLN
INTRAVENOUS | Status: DC
  Administered 2012-05-04 – 2012-05-07 (×7): via INTRAVENOUS
  Administered 2012-05-08: 100 mL/h via INTRAVENOUS
  Administered 2012-05-09: 08:00:00 via INTRAVENOUS
  Filled 2012-05-04 (×12): qty 1000

## 2012-05-04 MED ORDER — NICOTINE 14 MG/24HR TD PT24
14.0000 mg | MEDICATED_PATCH | Freq: Every day | TRANSDERMAL | Status: DC
  Administered 2012-05-04 – 2012-05-08 (×5): 14 mg via TRANSDERMAL
  Filled 2012-05-04 (×6): qty 1

## 2012-05-04 NOTE — Progress Notes (Signed)
INITIAL ADULT NUTRITION ASSESSMENT Date: 05/04/2012   Time: 4:42 PM Reason for Assessment: Nutrition risk   ASSESSMENT: Male 61 y.o.  Dx: ARF (acute renal failure)  Food/Nutrition Related Hx: Pt with rectal CA s/p chemotherapy and tumor resection with diverting ileostomy. Pt reports poor appetite for the past 2-3 weeks with pt eating 3 small meals/day, but would get full after a few bites. Pt reports 23 pound unintended weight loss in the past 2 months. Pt not on any nutritional supplements PTA. Pt denies any problems chewing or swallowing but does say sometimes his neck gets sore from chewing, but this hasn't happened in a while. Pt reports appetite improved in the past 2-3 days with 75% meal intake. Pt with too much ileostomy output, which he states is the reason for his admission. Noted plans for ileostomy takedown possibly this week.   Hx:  Past Medical History  Diagnosis Date  . Hypertension   . Abnormal EKG 11/01/11    REPORT IN EPIC-PT STATES NO KNOWN HEART PROBLEMS-NO CHEST PAINS  . 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 Low blood potassium     POTASSIUM WAS 2.1 ON 11/01/11 VISIT TO ER--PT PUT ON ORAL POTASSIUM--MOST RECENT LAB 11/24/11 POTASSIUM IMPROVED TO 3.1  . History of radiation therapy 09/29/11-11/07/11    rectal ca  . Nasal congestion   . COPD (chronic obstructive pulmonary disease)   . Heart murmur     SINCE BIRTH--DOES NOT CAUSE ANY PROBLEMS  . Dyspnea     no recent bronchitis  . Recurrent upper respiratory infection (URI)     HEAD COLD   . Sleep apnea     stop-bang score =6 (NO SLEEP STUDY DONE YET)  . No pertinent past medical history     2 SPOTS ON LIVER JUST WATCHING  . Hydronephrosis of right kidney     "Chronic" -SECONDARY TO CALCULUS (STENT RT URETER) MID RT URETER AND ADDITIONAL CALCULI WITHIN RT KIDNEY--PT STATES HE HAS PASSED A STONE IN THE PAST--DID NOT KNOW HE HAD MORE STONES UNTIL CT ABD DONE ON 09/03/11  . Rectal cancer     HAS COMPLETED RADIATION AND  XELODA--PLAN RESTAGING TESTING.  DR[facility]ett [facility]erna [facility]  . Cancer     rectal adenocarcinoma stage [id number]  . Hydroureter, right   . Nephrolithiasis    Related Meds:  Scheduled Meds:   . chlorhexidine  1 application Topical Once  . chlorhexidine  1 application Topical Once  . enoxaparin (LOVENOX) injection  40 mg Subcutaneous Q24H  . nicotine  14 mg Transdermal Daily   Continuous Infusions:   . dextrose 5 % and 0.45 % NaCl with KCl 20 mEq/L 100 mL/hr at 05/04/12 1437  . DISCONTD: 0.9 % NaCl with KCl 40 mEq / L 100 mL/hr at 05/03/12 2006   PRN Meds:.acetaminophen, acetaminophen, alum & mag hydroxide-simeth, menthol-cetylpyridinium, ondansetron (ZOFRAN) IV, ondansetron, oxyCODONE  Ht: 5\' 10"  (177.8 cm)  Wt: 149 lb (67.586 kg)  Ideal Wt: 166 lb % Ideal Wt: 89  Usual Wt: 172 lb in March 2013 % Usual Wt: 86  Body mass index is 21.38 kg/(m^2).   Labs:  CMP     Component Value Date/Time   NA 139 05/04/2012 0500   K 4.7 05/04/2012 0500   CL 103 05/04/2012 0500   CO2 28 05/04/2012 0500   GLUCOSE 96 05/04/2012 0500   BUN 29* 05/04/2012 0500   CREATININE 1.92* 05/04/2012 0500   CALCIUM 8.0*  05/04/2012 0500   PROT 5.8* 05/03/2012 2104   ALBUMIN 3.1* 05/03/2012 2104   AST 11 05/03/2012 2104   ALT 8 05/03/2012 2104   ALKPHOS 68 05/03/2012 2104   BILITOT 0.4 05/03/2012 2104   GFRNONAA 35* 05/04/2012 0500   GFRAA 41* 05/04/2012 0500    Intake/Output Summary (Last 24 hours) at 05/04/12 1650 Last data filed at 05/04/12 0810  Gross per 24 hour  Intake    240 ml  Output      0 ml  Net    240 ml   Ileostomy - small amount of loose green stool today  Diet Order: General   IVF:    dextrose 5 % and 0.45 % NaCl with KCl 20 mEq/L Last Rate: 100 mL/hr at 05/04/12 1437  DISCONTD: 0.9 % NaCl with KCl 40 mEq / L Last Rate: 100 mL/hr at 05/03/12 2006    Estimated Nutritional Needs:   Kcal:2025-2350 Protein:80-100g Fluid:2-2.3L  NUTRITION  DIAGNOSIS: -Increased nutrient needs (NI-5.1).  Status: Ongoing -Pt meets criteria for severe PCM of chronic illness AEB <75% energy intake for the past month with 13.3% weight loss in the past 2 months  RELATED TO: rectal CA, weight loss PTA  AS EVIDENCE BY: H&P, pt statement  MONITORING/EVALUATION(Goals): Pt to consume >90% of meals.   EDUCATION NEEDS: -Education needs addressed - provided handout on diet for ileostomy with a focus on foods that can thicken stool output. Pt expressed understanding.   INTERVENTION: Encouraged continued excellent intake. Pt not interested in nutritional supplements at this time. Will monitor.   Dietitian #: [phone]  DOCUMENTATION CODES Per approved criteria  -Severe malnutrition in the context of chronic illness    Marshall Cork 05/04/2012, 4:42 PM

## 2012-05-04 NOTE — Progress Notes (Signed)
General Surgery Note  LOS: 4 days  Room - 1306  Assessment/Plan:  1. Rectal Cancer.   Path - ypT3, ypN2, 4/10 nodes positive. KRAS mutation detected.   Completed neoadjuvant therapy by Drs. Sherrill and [facility] end of Nov/beginning of Dec 2012.   He had LAR - 02/06/2012.   Has diverting ileostomy.   CEA - 8.4 on 03/09/2012  Dr. Truett Perna has been treating him with chemotx post op, but Mr. Hsia's renal function has deteriorated. He has struggled with high ileostomy output and renal dysfunction.  I will plan reversal of the ileostomy during this hospitalization.  I have reviewed with him the indications and complications of the surgery.  I just need to find a time to do his surgery.  2. Hepatic nodules. ? Mets?   Needle aspiration of right lobe of liver by Dr. Jacqualine Code - 12/22/2011 - Benign.   But he had firm nodules in both lobes of liver palpable at the time of surgery.  3. Right hydroureter secondary to nephrolithiasis.   Dr. Brunilda Payor explored at surgery, but could not remove stone.  4. Acute/Chronic renal insufficiency with worsening creatinine.   Creat - 5/28-2013 - 1.92 4. Power port - 03/10/2012 - D. Calaya Gildner  5. Hypertension.  6. Smokes.  7.  Anemia -  Hgb - 7.7 - 05/02/2012  Will recheck Hgb today and transfuse in anticipation  of surgery.  Repeat Hgb - 8.4 - will hold transfusion for now, but have blood available for surgery. I talked with Craige Cotta (in lab) - type and cross for 3 units of blood - I could not put the order in Epic.  She will handle this. 8.  Thrombocytopenia.  Plts - 129,000 - 05/02/2012  Subjective:  Doing well. Looks good.  He looks bette than when I saw him last week.  HIs mother is at his bedside.  We talked about not smoking and I will try to get him Nicoderm. Objective:   Filed Vitals:   05/04/12 0535  BP: 120/76  Pulse: 79  Temp: 98.2 F (36.8 C)  Resp: 16     Intake/Output from previous day:  05/27 0701 - 05/28 0700 In: 720 [P.O.:720] Out: -    Intake/Output this shift:      Physical Exam:   General: WN AA M who is alert and oriented.    HEENT: Normal. Pupils equal. .   Lungs: clear   Abdomen: Soft.  Incision well healed.   Neurologic:  Grossly intact to motor and sensory function.   Psychiatric: Has normal mood and affect. Behavior is normal   Lab Results:    Basename 05/02/12 1110  WBC 5.5  HGB 7.7*  HCT 21.5*  PLT 129*    BMET   Basename 05/04/12 0500 05/03/12 2104  NA 139 141  K 4.7 5.1  CL 103 104  CO2 28 31  GLUCOSE 96 115*  BUN 29* 34*  CREATININE 1.92* 2.10*  CALCIUM 8.0* 7.7*    PT/INR   Basename 05/03/12 2104  LABPROT 14.9  INR 1.15    ABG  No results found for this basename: PHART:2,PCO2:2,PO2:2,HCO3:2 in the last 72 hours   Studies/Results:  Dg Colon W/water Sol Cm  05/03/2012  Clinical history:  Rectal cancer diagnosed in September 1012 treated with diverting ileostomy and resection of the tumor.  Water-soluble contrast examination of the colon  Findings:  The entire colon was filled in a normal retrograde manner.  There are a few small  diverticula in  the distal sigmoid portion of the colon.  The remainder of the colon appears normal. The presence of retained stool makes it impossible to exclude small polyps.  There is no anastomotic stricture.  Impression:  Scattered diverticula in the distal colon.  Otherwise, normal exam.  Original Report Authenticated By: Gwynn Burly, M.D.     Anti-infectives:   Anti-infectives    None      Ovidio Kin, MD, FACS Pager: [phone],   [facility] Surgery Office: [phone] 05/04/2012

## 2012-05-05 ENCOUNTER — Encounter (HOSPITAL_COMMUNITY): Admission: AD | Disposition: A | Discharge status: Home / Self Care | Payer: Self-pay | Source: Ambulatory Visit

## 2012-05-05 ENCOUNTER — Other Ambulatory Visit: Payer: 59 | Admitting: Lab

## 2012-05-05 ENCOUNTER — Encounter (HOSPITAL_COMMUNITY): Payer: Self-pay | Admitting: Anesthesiology

## 2012-05-05 ENCOUNTER — Ambulatory Visit: Payer: 59

## 2012-05-05 ENCOUNTER — Ambulatory Visit: Payer: 59 | Admitting: Nurse Practitioner

## 2012-05-05 ENCOUNTER — Inpatient Hospital Stay (HOSPITAL_COMMUNITY): Payer: 59 | Admitting: Anesthesiology

## 2012-05-05 DIAGNOSIS — Z432 Encounter for attention to ileostomy: Secondary | ICD-10-CM

## 2012-05-05 HISTORY — PX: ILEO LOOP COLOSTOMY CLOSURE: SHX5257

## 2012-05-05 LAB — DIFFERENTIAL
Eosinophils Absolute: 0.1 10*3/uL (ref 0.0–0.7)
Eosinophils Relative: 2 % (ref 0–5)
Lymphocytes Relative: 10 % — ABNORMAL LOW (ref 12–46)
Lymphs Abs: 0.5 10*3/uL — ABNORMAL LOW (ref 0.7–4.0)
Monocytes Absolute: 0.5 10*3/uL (ref 0.1–1.0)

## 2012-05-05 LAB — CBC
HCT: 22.2 % — ABNORMAL LOW (ref 39.0–52.0)
MCH: 37.1 pg — ABNORMAL HIGH (ref 26.0–34.0)
MCV: 108.3 fL — ABNORMAL HIGH (ref 78.0–100.0)
RBC: 2.05 MIL/uL — ABNORMAL LOW (ref 4.22–5.81)
WBC: 5.3 10*3/uL (ref 4.0–10.5)

## 2012-05-05 LAB — BASIC METABOLIC PANEL
CO2: 27 mEq/L (ref 19–32)
Calcium: 7.7 mg/dL — ABNORMAL LOW (ref 8.4–10.5)
Chloride: 105 mEq/L (ref 96–112)
Creatinine, Ser: 1.6 mg/dL — ABNORMAL HIGH (ref 0.50–1.35)
Glucose, Bld: 114 mg/dL — ABNORMAL HIGH (ref 70–99)
Sodium: 139 mEq/L (ref 135–145)

## 2012-05-05 SURGERY — CLOSURE, ILEOSTOMY, LAPAROSCOPIC, WITH LAPAROTOMY IF INDICATED
Anesthesia: General | Site: Abdomen | Wound class: Clean Contaminated

## 2012-05-05 MED ORDER — HEPARIN SOD (PORK) LOCK FLUSH 100 UNIT/ML IV SOLN
500.0000 [IU] | Freq: Once | INTRAVENOUS | Status: AC
  Administered 2012-05-05: 500 [IU] via INTRAVENOUS

## 2012-05-05 MED ORDER — GLYCOPYRROLATE 0.2 MG/ML IJ SOLN
INTRAMUSCULAR | Status: DC | PRN
  Administered 2012-05-05: 0.4 mg via INTRAVENOUS

## 2012-05-05 MED ORDER — ONDANSETRON HCL 4 MG/2ML IJ SOLN: 4.0000 mg | Freq: Four times a day (QID) | INTRAMUSCULAR | Status: DC | PRN

## 2012-05-05 MED ORDER — ONDANSETRON HCL 4 MG/2ML IJ SOLN
INTRAMUSCULAR | Status: DC | PRN
  Administered 2012-05-05 (×2): 2 mg via INTRAVENOUS

## 2012-05-05 MED ORDER — ACETAMINOPHEN 10 MG/ML IV SOLN
INTRAVENOUS | Status: DC | PRN
  Administered 2012-05-05: 1000 mg via INTRAVENOUS

## 2012-05-05 MED ORDER — PROPOFOL 10 MG/ML IV EMUL
INTRAVENOUS | Status: DC | PRN
  Administered 2012-05-05: 125 mg via INTRAVENOUS
  Administered 2012-05-05: 75 mg via INTRAVENOUS

## 2012-05-05 MED ORDER — 0.9 % SODIUM CHLORIDE (POUR BTL) OPTIME
TOPICAL | Status: DC | PRN
  Administered 2012-05-05: 1000 mL

## 2012-05-05 MED ORDER — NEOSTIGMINE METHYLSULFATE 1 MG/ML IJ SOLN
INTRAMUSCULAR | Status: DC | PRN
  Administered 2012-05-05: 4 mg via INTRAVENOUS

## 2012-05-05 MED ORDER — ONDANSETRON HCL 4 MG PO TABS: 4.0000 mg | ORAL_TABLET | Freq: Four times a day (QID) | ORAL | Status: DC | PRN

## 2012-05-05 MED ORDER — PHENYLEPHRINE HCL 10 MG/ML IJ SOLN
INTRAMUSCULAR | Status: DC | PRN
  Administered 2012-05-05: 80 ug via INTRAVENOUS
  Administered 2012-05-05 (×2): 40 ug via INTRAVENOUS
  Administered 2012-05-05: 20 ug via INTRAVENOUS
  Administered 2012-05-05: 80 ug via INTRAVENOUS

## 2012-05-05 MED ORDER — HYDROMORPHONE HCL PF 1 MG/ML IJ SOLN: 0.2500 mg | INTRAMUSCULAR | Status: DC | PRN

## 2012-05-05 MED ORDER — SODIUM CHLORIDE 0.9 % IV SOLN
INTRAVENOUS | Status: DC | PRN
  Administered 2012-05-05 (×3): via INTRAVENOUS

## 2012-05-05 MED ORDER — CISATRACURIUM BESYLATE (PF) 10 MG/5ML IV SOLN
INTRAVENOUS | Status: DC | PRN
  Administered 2012-05-05: 2 mg via INTRAVENOUS
  Administered 2012-05-05: 4 mg via INTRAVENOUS
  Administered 2012-05-05: 2 mg via INTRAVENOUS
  Administered 2012-05-05: 6 mg via INTRAVENOUS

## 2012-05-05 MED ORDER — NALOXONE HCL 0.4 MG/ML IJ SOLN: 0.4000 mg | INTRAMUSCULAR | Status: DC | PRN

## 2012-05-05 MED ORDER — MORPHINE SULFATE (PF) 1 MG/ML IV SOLN
INTRAVENOUS | Status: DC
  Administered 2012-05-05: 19:00:00 via INTRAVENOUS
  Administered 2012-05-06 (×2): 1.5 mg via INTRAVENOUS
  Administered 2012-05-06: 3 mg via INTRAVENOUS
  Administered 2012-05-06: 1.5 mg via INTRAVENOUS

## 2012-05-05 MED ORDER — DIPHENHYDRAMINE HCL 12.5 MG/5ML PO ELIX
12.5000 mg | ORAL_SOLUTION | Freq: Four times a day (QID) | ORAL | Status: DC | PRN
  Filled 2012-05-05: qty 5

## 2012-05-05 MED ORDER — LACTATED RINGERS IR SOLN
Status: DC | PRN
  Administered 2012-05-05: 3000 mL

## 2012-05-05 MED ORDER — LACTATED RINGERS IV SOLN
INTRAVENOUS | Status: DC
  Administered 2012-05-05: 1000 mL via INTRAVENOUS

## 2012-05-05 MED ORDER — ENOXAPARIN SODIUM 40 MG/0.4ML ~~LOC~~ SOLN
40.0000 mg | SUBCUTANEOUS | Status: DC
  Administered 2012-05-06 – 2012-05-09 (×4): 40 mg via SUBCUTANEOUS
  Filled 2012-05-05 (×5): qty 0.4

## 2012-05-05 MED ORDER — LIDOCAINE HCL (CARDIAC) 20 MG/ML IV SOLN
INTRAVENOUS | Status: DC | PRN
  Administered 2012-05-05: 50 mg via INTRAVENOUS

## 2012-05-05 MED ORDER — HETASTARCH-ELECTROLYTES 6 % IV SOLN
INTRAVENOUS | Status: DC | PRN
  Administered 2012-05-05: 18:00:00 via INTRAVENOUS

## 2012-05-05 MED ORDER — SUCCINYLCHOLINE CHLORIDE 20 MG/ML IJ SOLN
INTRAMUSCULAR | Status: DC | PRN
  Administered 2012-05-05: 100 mg via INTRAVENOUS

## 2012-05-05 MED ORDER — BUPIVACAINE HCL 0.25 % IJ SOLN
INTRAMUSCULAR | Status: DC | PRN
  Administered 2012-05-05: 30 mL

## 2012-05-05 MED ORDER — DIPHENHYDRAMINE HCL 50 MG/ML IJ SOLN: 12.5000 mg | Freq: Four times a day (QID) | INTRAMUSCULAR | Status: DC | PRN

## 2012-05-05 MED ORDER — DEXTROSE 5 % IV SOLN
1.0000 g | Freq: Four times a day (QID) | INTRAVENOUS | Status: AC
  Administered 2012-05-05: 1 g via INTRAVENOUS
  Filled 2012-05-05 (×2): qty 1

## 2012-05-05 MED ORDER — PROMETHAZINE HCL 25 MG/ML IJ SOLN: 6.2500 mg | INTRAMUSCULAR | Status: DC | PRN

## 2012-05-05 MED ORDER — LACTATED RINGERS IV SOLN
INTRAVENOUS | Status: DC | PRN
  Administered 2012-05-05: 16:00:00 via INTRAVENOUS

## 2012-05-05 MED ORDER — MEPERIDINE HCL 50 MG/ML IJ SOLN
6.2500 mg | INTRAMUSCULAR | Status: DC | PRN
  Administered 2012-05-05: 12.5 mg via INTRAVENOUS

## 2012-05-05 MED ORDER — MIDAZOLAM HCL 5 MG/5ML IJ SOLN
INTRAMUSCULAR | Status: DC | PRN
  Administered 2012-05-05 (×2): 1 mg via INTRAVENOUS

## 2012-05-05 MED ORDER — DEXTROSE 5 % IV SOLN
1.0000 g | INTRAVENOUS | Status: DC | PRN
  Administered 2012-05-05: 1 g via INTRAVENOUS

## 2012-05-05 MED ORDER — FENTANYL CITRATE 0.05 MG/ML IJ SOLN
INTRAMUSCULAR | Status: DC | PRN
  Administered 2012-05-05 (×2): 50 ug via INTRAVENOUS
  Administered 2012-05-05: 150 ug via INTRAVENOUS
  Administered 2012-05-05: 100 ug via INTRAVENOUS
  Administered 2012-05-05: 50 ug via INTRAVENOUS
  Administered 2012-05-05: 100 ug via INTRAVENOUS
  Administered 2012-05-05 (×2): 50 ug via INTRAVENOUS

## 2012-05-05 MED ORDER — SODIUM CHLORIDE 0.9 % IJ SOLN: 9.0000 mL | INTRAMUSCULAR | Status: DC | PRN

## 2012-05-05 MED ORDER — MEPERIDINE HCL 50 MG/ML IJ SOLN: 6.2500 mg | INTRAMUSCULAR | Status: DC | PRN

## 2012-05-05 SURGICAL SUPPLY — 76 items
ADH SKN CLS APL DERMABOND .7 (GAUZE/BANDAGES/DRESSINGS)
APPLIER CLIP 5 13 M/L LIGAMAX5 (MISCELLANEOUS)
APPLIER CLIP ROT 10 11.4 M/L (STAPLE)
APR CLP MED LRG 11.4X10 (STAPLE)
APR CLP MED LRG 5 ANG JAW (MISCELLANEOUS)
BLADE EXTENDED COATED 6.5IN (ELECTRODE) ×1 IMPLANT
BLADE HEX COATED 2.75 (ELECTRODE) ×2 IMPLANT
BLADE SURG SZ10 CARB STEEL (BLADE) ×4 IMPLANT
CANISTER SUCTION 2500CC (MISCELLANEOUS) ×2 IMPLANT
CANNULA ENDOPATH XCEL 11M (ENDOMECHANICALS) IMPLANT
CELLS DAT CNTRL 66122 CELL SVR (MISCELLANEOUS) IMPLANT
CLIP APPLIE 5 13 M/L LIGAMAX5 (MISCELLANEOUS) IMPLANT
CLIP APPLIE ROT 10 11.4 M/L (STAPLE) IMPLANT
CLOTH BEACON ORANGE TIMEOUT ST (SAFETY) ×2 IMPLANT
COVER MAYO STAND STRL (DRAPES) ×2 IMPLANT
CUTTER FLEX LINEAR 45M (STAPLE) IMPLANT
DECANTER SPIKE VIAL GLASS SM (MISCELLANEOUS) ×2 IMPLANT
DERMABOND ADVANCED (GAUZE/BANDAGES/DRESSINGS)
DERMABOND ADVANCED .7 DNX12 (GAUZE/BANDAGES/DRESSINGS) IMPLANT
DRAPE LAPAROSCOPIC ABDOMINAL (DRAPES) ×2 IMPLANT
DRAPE LG THREE QUARTER DISP (DRAPES) ×1 IMPLANT
DRAPE WARM FLUID 44X44 (DRAPE) ×2 IMPLANT
DRESSING TELFA 8X3 (GAUZE/BANDAGES/DRESSINGS) ×1 IMPLANT
DRSG PAD ABDOMINAL 8X10 ST (GAUZE/BANDAGES/DRESSINGS) ×1 IMPLANT
ELECT REM PT RETURN 9FT ADLT (ELECTROSURGICAL) ×2
ELECTRODE REM PT RTRN 9FT ADLT (ELECTROSURGICAL) ×1 IMPLANT
FILTER SMOKE EVAC LAPAROSHD (FILTER) IMPLANT
GLOVE SURG SIGNA 7.5 PF LTX (GLOVE) ×4 IMPLANT
GOWN STRL NON-REIN LRG LVL3 (GOWN DISPOSABLE) ×2 IMPLANT
GOWN STRL REIN XL XLG (GOWN DISPOSABLE) ×4 IMPLANT
KIT BASIN OR (CUSTOM PROCEDURE TRAY) ×2 IMPLANT
LEGGING LITHOTOMY PAIR STRL (DRAPES) ×1 IMPLANT
LIGASURE IMPACT 36 18CM CVD LR (INSTRUMENTS) IMPLANT
NS IRRIG 1000ML POUR BTL (IV SOLUTION) ×3 IMPLANT
PENCIL BUTTON HOLSTER BLD 10FT (ELECTRODE) ×2 IMPLANT
RELOAD 45 VASCULAR/THIN (ENDOMECHANICALS) IMPLANT
RELOAD BLUE (STAPLE) ×2 IMPLANT
RELOAD LINEAR CUT PROX 55 BLUE (ENDOMECHANICALS) ×2 IMPLANT
RELOAD STAPLE 45 2.5 WHT GRN (ENDOMECHANICALS) IMPLANT
RELOAD STAPLE 45 3.5 BLU ETS (ENDOMECHANICALS) IMPLANT
RELOAD STAPLE 55 3.8 BLU REG (ENDOMECHANICALS) IMPLANT
RELOAD STAPLE TA45 3.5 REG BLU (ENDOMECHANICALS) IMPLANT
RETRACTOR WND ALEXIS 18 MED (MISCELLANEOUS) IMPLANT
RTRCTR WOUND ALEXIS 18CM MED (MISCELLANEOUS)
SCALPEL HARMONIC ACE (MISCELLANEOUS) IMPLANT
SET IRRIG TUBING LAPAROSCOPIC (IRRIGATION / IRRIGATOR) ×1 IMPLANT
SOLUTION ANTI FOG 6CC (MISCELLANEOUS) ×2 IMPLANT
SPONGE GAUZE 4X4 12PLY (GAUZE/BANDAGES/DRESSINGS) ×2 IMPLANT
SPONGE LAP 18X18 X RAY DECT (DISPOSABLE) ×4 IMPLANT
STAPLE ECHEON FLEX 60 POW ENDO (STAPLE) IMPLANT
STAPLER PROXIMATE 55 BLUE (STAPLE) ×1 IMPLANT
STAPLER VISISTAT 35W (STAPLE) ×1 IMPLANT
STRIP CLOSURE SKIN 1/2X4 (GAUZE/BANDAGES/DRESSINGS) IMPLANT
SUCTION POOLE TIP (SUCTIONS) ×2 IMPLANT
SUT MON AB 5-0 PS2 18 (SUTURE) ×2 IMPLANT
SUT NOVA NAB DX-16 0-1 5-0 T12 (SUTURE) IMPLANT
SUT PROLENE 2 0 KS (SUTURE) IMPLANT
SUT PROLENE 2 0 SH DA (SUTURE) IMPLANT
SUT SILK 2 0 (SUTURE) ×2
SUT SILK 2 0 SH CR/8 (SUTURE) IMPLANT
SUT SILK 2-0 18XBRD TIE 12 (SUTURE) ×1 IMPLANT
SUT SILK 3 0 (SUTURE)
SUT SILK 3 0 SH CR/8 (SUTURE) IMPLANT
SUT SILK 3-0 18XBRD TIE 12 (SUTURE) IMPLANT
SYR BULB IRRIGATION 50ML (SYRINGE) ×2 IMPLANT
TAPE CLOTH SURG 4X10 WHT LF (GAUZE/BANDAGES/DRESSINGS) ×1 IMPLANT
TOWEL OR 17X26 10 PK STRL BLUE (TOWEL DISPOSABLE) ×2 IMPLANT
TRAY FOLEY CATH 14FRSI W/METER (CATHETERS) ×2 IMPLANT
TRAY LAP CHOLE (CUSTOM PROCEDURE TRAY) ×2 IMPLANT
TROCAR BLADELESS OPT 5 75 (ENDOMECHANICALS) ×3 IMPLANT
TROCAR XCEL 12X100 BLDLESS (ENDOMECHANICALS) IMPLANT
TROCAR XCEL BLUNT TIP 100MML (ENDOMECHANICALS) IMPLANT
TROCAR XCEL NON-BLD 11X100MML (ENDOMECHANICALS) IMPLANT
TUBING FILTER THERMOFLATOR (ELECTROSURGICAL) ×2 IMPLANT
YANKAUER SUCT BULB TIP 10FT TU (MISCELLANEOUS) ×2 IMPLANT
YANKAUER SUCT BULB TIP NO VENT (SUCTIONS) ×2 IMPLANT

## 2012-05-05 NOTE — Progress Notes (Signed)
General Surgery Note  LOS: 5 days  Room - 1306  Assessment/Plan: 1. Rectal Cancer.   Path - ypT3, ypN2, 4/10 nodes positive. KRAS mutation detected.   Completed neoadjuvant therapy by Drs. Sherrill and [place] end of Nov/beginning of Dec 2012.   He had LAR - 02/06/2012. Has diverting ileostomy.   CEA - 2.1 on 05/03/2012 (this is down from 6 weeks ago!) Dr. Truett Perna has been treating him with chemotx post op, but Mr. Powless's renal function has deteriorated. He has struggled with high ileostomy output and renal dysfunction.  His renal function is almost back to normal.  Plan ileostomy reversal later today.  Patient understands plan.  2. Hepatic nodules. ? Mets?   Needle aspiration of right lobe of liver by Dr. Jacqualine Code - 12/22/2011 - Benign.   But he had firm nodules in both lobes of liver palpable at the time of surgery.  3. Right hydroureter secondary to nephrolithiasis.   Dr. Brunilda Payor explored at surgery, but could not remove stone.  4. Acute/Chronic renal insufficiency.  This continues to improve with hydration..   Creat - 05/05/2012 - 1.60 4. Power port - 03/10/2012 - D. Bjorn Hallas  5. Hypertension.  6. Smokes.  7.  Anemia -  Hgb - 7.6 - 05/05/2012  I will transfuse one unit in anticipation of surgery later today. 8.  Thrombocytopenia.  Plts - 139,000 - 05/05/2012  Subjective:  He feels good.  No concerns.  We talked about the surgery today. Objective:   Filed Vitals:   05/05/12 0620  BP: 122/65  Pulse: 80  Temp: 98.3 F (36.8 C)  Resp: 18     Intake/Output from previous day:  05/28 0701 - 05/29 0700 In: 918.3 [P.O.:480; I.V.:438.3] Out: -   Intake/Output this shift:      Physical Exam:   General: WN AA M who is alert and oriented.    HEENT: Normal. Pupils equal. .   Lungs: clear   Abdomen: Soft.  Incision well healed.   Neurologic:  Grossly intact to motor and sensory function.   Psychiatric: Has normal mood and affect. Behavior is normal   Lab Results:     Basename  05/05/12 0500 05/04/12 1315  WBC 5.3 5.7  HGB 7.6* 8.4*  HCT 22.2* 24.0*  PLT 139* 149*    BMET    Basename 05/05/12 0500 05/04/12 0500  NA 139 139  K 4.3 4.7  CL 105 103  CO2 27 28  GLUCOSE 114* 96  BUN 24* 29*  CREATININE 1.60* 1.92*  CALCIUM 7.7* 8.0*    PT/INR    Basename 05/03/12 2104  LABPROT 14.9  INR 1.15    ABG  No results found for this basename: PHART:2,PCO2:2,PO2:2,HCO3:2 in the last 72 hours   Studies/Results:  Dg Colon W/water Sol Cm  05/03/2012  Clinical history:  Rectal cancer diagnosed in September 1012 treated with diverting ileostomy and resection of the tumor.  Water-soluble contrast examination of the colon  Findings:  The entire colon was filled in a normal retrograde manner.  There are a few small  diverticula in the distal sigmoid portion of the colon.  The remainder of the colon appears normal. The presence of retained stool makes it impossible to exclude small polyps.  There is no anastomotic stricture.  Impression:  Scattered diverticula in the distal colon.  Otherwise, normal exam.  Original Report Authenticated By: Gwynn Burly, M.D.     Anti-infectives:   Anti-infectives    None  Ovidio Kin, MD, FACS Pager: [phone],   [facility] Surgery Office: [phone] 05/05/2012

## 2012-05-05 NOTE — Addendum Note (Signed)
Addendum  created 05/05/12 1942 by Eilene Ghazi, MD   Modules edited:Orders, PRL Based Order Sets

## 2012-05-05 NOTE — Anesthesia Postprocedure Evaluation (Signed)
  Anesthesia Post-op Note  Patient: [name] [name]  Procedure(s) Performed: Procedure(s) (LRB): LAPAROSCOPIC ILEO LOOP COLOSTOMY CLOSURE (N/A)  Patient Location: PACU  Anesthesia Type: General  Level of Consciousness: awake and alert   Airway and Oxygen Therapy: Patient Spontanous Breathing  Post-op Pain: mild  Post-op Assessment: Post-op Vital signs reviewed, Patient's Cardiovascular Status Stable, Respiratory Function Stable, Patent Airway and No signs of Nausea or vomiting  Post-op Vital Signs: stable  Complications: No apparent anesthesia complications

## 2012-05-05 NOTE — Transfer of Care (Signed)
Immediate Anesthesia Transfer of Care Note  Patient: [name] [name]  Procedure(s) Performed: Procedure(s) (LRB): LAPAROSCOPIC ILEO LOOP COLOSTOMY CLOSURE (N/A)  Patient Location: PACU  Anesthesia Type: General  Level of Consciousness: awake, oriented, lethargic and responds to stimulation  Airway & Oxygen Therapy: Patient Spontanous Breathing and Patient connected to face mask oxygen  Post-op Assessment: Report given to PACU RN, Post -op Vital signs reviewed and stable and Patient moving all extremities  Post vital signs: Reviewed and stable  Complications: No apparent anesthesia complications

## 2012-05-05 NOTE — Progress Notes (Signed)
dsg at rlq is saturated with seroussang. Drg. Reinforced with 4x4's.

## 2012-05-05 NOTE — Progress Notes (Signed)
Given demerol for shaking

## 2012-05-05 NOTE — Preoperative (Signed)
Beta Blockers   Reason not to administer Beta Blockers:Not Applicable 

## 2012-05-05 NOTE — Anesthesia Procedure Notes (Signed)
Procedure Name: Intubation Date/Time: 05/05/2012 4:11 PM Performed by: Edison Pace Pre-anesthesia Checklist: Patient identified, Timeout performed, Emergency Drugs available, Suction available and Patient being monitored Patient Re-evaluated:Patient Re-evaluated prior to inductionOxygen Delivery Method: Circle system utilized Preoxygenation: Pre-oxygenation with 100% oxygen Intubation Type: IV induction Ventilation: Mask ventilation without difficulty Laryngoscope Size: Mac and 4 Grade View: Grade II Number of attempts: 1 Airway Equipment and Method: Stylet Placement Confirmation: ETT inserted through vocal cords under direct vision,  positive ETCO2 and breath sounds checked- equal and bilateral Secured at: 21 cm Tube secured with: Tape Dental Injury: Teeth and Oropharynx as per pre-operative assessment  Difficulty Due To: Difficulty was anticipated Future Recommendations: Recommend- induction with short-acting agent, and alternative techniques readily available

## 2012-05-05 NOTE — Anesthesia Preprocedure Evaluation (Addendum)
Anesthesia Evaluation  Patient identified by MRN, date of birth, ID band Patient awake    Reviewed: Allergy & Precautions, H&P , NPO status , Patient's Chart, lab work & pertinent test results  Airway Mallampati: II TM Distance: >3 FB Neck ROM: Full    Dental No notable dental hx.    Pulmonary shortness of breath, sleep apnea , COPDRecent URI ,  breath sounds clear to auscultation  Pulmonary exam normal       Cardiovascular hypertension, Pt. on home beta blockers and Pt. on medications + Valvular Problems/Murmurs Rhythm:Regular Rate:Normal  Anemia. HGB 7.6 ECG: ectopic atrial rhythm. PAC's   Neuro/Psych negative neurological ROS  negative psych ROS   GI/Hepatic negative GI ROS, Neg liver ROS,   Endo/Other  negative endocrine ROS  Renal/GU Renal InsufficiencyRenal diseaseCR 1.6  negative genitourinary   Musculoskeletal negative musculoskeletal ROS (+)   Abdominal   Peds negative pediatric ROS (+)  Hematology negative hematology ROS (+)   Anesthesia Other Findings   Reproductive/Obstetrics negative OB ROS                         Anesthesia Physical Anesthesia Plan  ASA: III  Anesthesia Plan: General   Post-op Pain Management:    Induction: Intravenous  Airway Management Planned: Oral ETT  Additional Equipment:   Intra-op Plan:   Post-operative Plan: Extubation in OR  Informed Consent: I have reviewed the patients History and Physical, chart, labs and discussed the procedure including the risks, benefits and alternatives for the proposed anesthesia with the patient or authorized representative who has indicated his/her understanding and acceptance.   Dental advisory given  Plan Discussed with: CRNA  Anesthesia Plan Comments:         Anesthesia Quick Evaluation

## 2012-05-05 NOTE — Brief Op Note (Signed)
04/30/2012 - 05/05/2012  6:35 PM  PATIENT:  [name] [name], 61 y.o., male, MRN: [id number]  PREOP DIAGNOSIS:  illeostomy  POSTOP DIAGNOSIS:   Ileostomy, history of rectal cancer  PROCEDURE:   Procedure(s): LAPAROSCOPIC assisted ILEO LOOP OSTOMY CLOSURE  SURGEON:   Ovidio Kin, M.D.  ASSISTANT:   None  ANESTHESIA:   general  Edison Pace - CRNA Eilene Ghazi, MD - Anesthesiologist Illene Silver, CRNA - CRNA  General  EBL:  100  ml  BLOOD ADMINISTERED: none  DRAINS: none   LOCAL MEDICATIONS USED:   30 cc 1/4% marcaine  SPECIMEN:   Ileostomy loop  COUNTS CORRECT:  YES  INDICATIONS FOR PROCEDURE:  [name] [name] is a 61 y.o. (DOB: 10-29-1951) AA male whose primary care physician is Carrie Mew, MD, MD and comes for reversal of ileostomy.   The indications and risks of the surgery were explained to the patient.  The risks include, but are not limited to, infection, bleeding, and nerve injury.  Note dictated to:   #[id number]

## 2012-05-06 ENCOUNTER — Encounter (HOSPITAL_COMMUNITY): Payer: Self-pay | Admitting: Surgery

## 2012-05-06 LAB — BASIC METABOLIC PANEL
BUN: 15 mg/dL (ref 6–23)
CO2: 26 mEq/L (ref 19–32)
Calcium: 7.3 mg/dL — ABNORMAL LOW (ref 8.4–10.5)
Creatinine, Ser: 1.44 mg/dL — ABNORMAL HIGH (ref 0.50–1.35)
GFR calc non Af Amer: 50 mL/min — ABNORMAL LOW (ref >90)
Glucose, Bld: 123 mg/dL — ABNORMAL HIGH (ref 70–99)

## 2012-05-06 LAB — CBC
HCT: 24.2 % — ABNORMAL LOW (ref 39.0–52.0)
MCH: 36 pg — ABNORMAL HIGH (ref 26.0–34.0)
MCHC: 35.1 g/dL (ref 30.0–36.0)
MCV: 102.5 fL — ABNORMAL HIGH (ref 78.0–100.0)
Platelets: 151 10*3/uL (ref 150–400)
RDW: 17.9 % — ABNORMAL HIGH (ref 11.5–15.5)

## 2012-05-06 NOTE — Progress Notes (Signed)
Pt. Appears to be resting comfortably. Denies any pain. Dressings to Abdomen x4 are dry and intact. Foley patent and draing yellow urine. Pt. Eating ice chips. Visitor in with pt.

## 2012-05-06 NOTE — Plan of Care (Signed)
Problem: Phase III Progression Outcomes Goal: Voiding independently Outcome: Progressing Pt. Has foley.

## 2012-05-06 NOTE — Op Note (Signed)
[name] [name], [name] [name]              ACCOUNT NO.:  [id number]  MEDICAL RECORD NO.:  [id number]  LOCATION:  1306                         FACILITY:  [facility]  PHYSICIAN:  Sandria Bales. Ezzard Standing, M.D.  DATE OF BIRTH:  1951-01-17  DATE OF PROCEDURE:  05/05/2012                              OPERATIVE REPORT   PREOPERATIVE DIAGNOSES:  Diverting loop ileostomy, T3 N2 rectal cancer.  POSTOPERATIVE DIAGNOSES:  Diverting ileostomy, T3 N2 rectal cancer.  PROCEDURE:  Laparoscopic assisted ileal loop closure.  SURGEON:  Sandria Bales. Ezzard Standing, M.D.  ASSISTANT:  No first assistant.  ANESTHESIA:  General endotracheal, supervised by Dr. Eilene Ghazi.  ESTIMATED BLOOD LOSS:  100 cc.  DRAINS:  Left in were some, Telfa wicks in the wound.  INDICATION OF PROCEDURE:  [name] [name] is a 61 year old African American male who was found to have a rectal cancer by Dr. Sheryn Bison in September 2012.  He underwent neoadjuvant chemoradiation, supervised by Dr. Mancel Bale and Dr. Margaretmary Dys.  On February 06, 2012, I did a low anterior resection for this cancer.  He subsequently has been undergoing adjuvant chemotherapy supervised by Dr. Mancel Bale, but has had a lot of trouble with his renal function and ileostomy.  His creatinine did crept up to 4.5.  He was not able to take medicines to control the ostomy output.  Dr. Truett Perna and I had a long talk about hydrating and reversing his ileostomy, which would interrupt his chemotherapy, but then hope to make his chemotherapy more tolerable.  Indication and potential complications of ileostomy reversal were discussed with the patient.  Potential complications include, but are not limited to, bleeding, infection, leakage from the bowel.  I had done a barium enema which confirmed that the anastomosis was patent without any problems.  OPERATIVE NOTE:  The patient in lithotomy position in room #11 at [facility], underwent a general endotracheal anesthetic  supervised by Dr. Eilene Ghazi.  He was given 1 g of cefoxitin at the initiation of procedure.    A time-out was held and surgical checklist run.  I accessed the abdominal cavity through the left upper quadrant 5 mm Optiview trocar.  I placed two additional trocars, one in the left lower quadrant and one in the right upper quadrant.  He had very few anterior wall adhesions.  He had some adhesions around his ileostomy, I took these down laparoscopically and found where the small bowel exited the abdominal cavity in the right lower quadrant.  I also took time looking over his liver for any evidence of metastatic Disease. I got a good look at both the undersurface of the right lobe and the dome of the right lobe.  He had a small about a 1-1/2 cm hemangioma in the mid right dome of the liver, but no evidence of metastatic disease.  On the left liver, it was unremarkable, except for another 1 cm hemangioma in the mid left lobe.  So, he has no gross evidence of surface mets on my laparoscopic evaluation.  I then made an incision around the ileostomy, mobilized the ileostomy up enough to do a anastomosis.  I resected the loop ileostomy.  I stapled both ends with  the 55 GIA stapler.  I did a stapled side to side with the 55 GIA stapler between the 2 loops of ileum.  I closed the enterotomy with interrupted 3-0 silk sutures and closed the fascial defect with 3-0 silk sutures.  He had a good at least a 3 cm opening between the two loops of small bowel.  Then, reduced this back into the abdominal cavity.    I closed the right lower quadrant incision in 2 layers with a deep two running PDS sutures and superficial PDS sutures.  I irrigated the wound, I had changed gloves upon closure.  I then closed the skin with skin staples.  I then relaparoscoped the patient, the bowel looked viable and was not twisted or kinked.  The fascia defect was well closed.  I infiltrated the fascia with about 20 cc of  0.25% Marcaine, and infiltrated about 10 cc around the other trocars for a total of 30 cc of 0.25% Marcaine.    He tolerated the procedure well, was transferred to recovery room in good condition.  Sponge and needle count were correct in the case.  Wounds were closed with staples.  I did send ileostomy off to Pathology, though I saw nothing suspicious about it.   Sandria Bales. Ezzard Standing, M.D.     DHN/MEDQ  D:  05/05/2012  T:  05/06/2012  Job:  [id number]  cc:   Stacie Glaze, MD [address] [place] [address]  Oneita Hurt, M.D. Fax: [phone]  Mindi Slicker. Lowell Guitar, M.D. Fax: [phone]  Ladene Artist, M.D. Fax: [phone]  Lindaann Slough, M.D. Fax: [phone]

## 2012-05-06 NOTE — Progress Notes (Signed)
1 Day Post-Op  Subjective: Comfortable with PCA, no complaints.  Would like to get OOB and something to eat.  Objective: Vital signs in last 24 hours: Temp:  [97.5 F (36.4 C)-98.8 F (37.1 C)] 98.6 F (37 C) (05/30 1000) Pulse Rate:  [66-87] 82  (05/30 1000) Resp:  [10-20] 18  (05/30 1000) BP: (129-166)/(73-85) 146/76 mmHg (05/30 1000) SpO2:  [97 %-100 %] 100 % (05/30 1000) Last BM Date: 05/05/12  Afebrile, VSS,creatinine improving, H/H stable  Intake/Output from previous day: 05/29 0701 - 05/30 0700 In: 5126.7 [I.V.:4531.7; Blood:345; IV Piggyback:250] Out: 2955 [Urine:2905; Blood:50] Intake/Output this shift: Total I/O In: 0  Out: 300 [Urine:300]  General appearance: alert, cooperative and no distress Resp: clear to auscultation bilaterally GI: Dressing in place, surgery finished last PM, BS hypoactive.  no flatus/Bm.  Lab Results:   Basename 05/06/12 0600 05/05/12 1950 05/05/12 0500  WBC 6.6 -- 5.3  HGB 8.5* 9.2* --  HCT 24.2* 27.1* --  PLT 151 -- 139*    BMET  Basename 05/06/12 0600 05/05/12 0500  NA 138 139  K 4.1 4.3  CL 104 105  CO2 26 27  GLUCOSE 123* 114*  BUN 15 24*  CREATININE 1.44* 1.60*  CALCIUM 7.3* 7.7*   PT/INR  Basename 05/03/12 2104  LABPROT 14.9  INR 1.15     Lab 05/03/12 2104  AST 11  ALT 8  ALKPHOS 68  BILITOT 0.4  PROT 5.8*  ALBUMIN 3.1*     Lipase     Component Value Date/Time   LIPASE 39 11/01/2011 1420     Studies/Results: No results found.  Medications:    . cefOXitin  1 g Intravenous Q6H  . enoxaparin  40 mg Subcutaneous Q24H  . heparin lock flush  500 Units Intravenous Once  . morphine   Intravenous Q4H  . nicotine  14 mg Transdermal Daily  . DISCONTD: enoxaparin (LOVENOX) injection  40 mg Subcutaneous Q24H    Assessment/Plan 1. Ileostomy, history of rectal cancer s/p LAPAROSCOPIC assisted ILEO LOOP OSTOMY CLOSURE 05/05/12 Dr. Ezzard Standing Hepatic nodules. ? Mets?  Needle aspiration of right lobe of  liver by Dr. Jacqualine Code - 12/22/2011 - Benign.  But he had firm nodules in both lobes of liver palpable at the time of surgery.  3. Right hydroureter secondary to nephrolithiasis.  Dr. Brunilda Payor explored at surgery, but could not remove stone.  4. Acute/Chronic renal insufficiency. This continues to improve with hydration..  Creat - 05/05/2012 - 1.60  4. Power port - 03/10/2012 - D. Akyla Vavrek  5. Hypertension.  6. Smokes.  7. Anemia -  Hgb - 7.6 - 05/05/2012  I will transfuse one unit in anticipation of surgery later today.  8. Thrombocytopenia.  Plts - 139,000 - 05/05/2012   Plan:  OOB, begin to ambulate/mobilize.  IS q1h     LOS: 6 days   [name] [name],[name] [name] 05/06/2012  Doing well. Try foley out tonight.  He said that he has never had trouble voiding.  Labs ordered for tomorrow.  Keep NPO until GI function returns.  Ovidio Kin, MD, [facility] Surgery Pager: [phone] Office phone:  [phone]

## 2012-05-06 NOTE — Plan of Care (Signed)
Problem: Phase III Progression Outcomes Goal: Pain controlled on oral analgesia Outcome: Progressing Has not asked for pain medicine.

## 2012-05-06 NOTE — Plan of Care (Signed)
Problem: Phase III Progression Outcomes Goal: Activity at appropriate level-compared to baseline (UP IN CHAIR FOR HEMODIALYSIS)  Outcome: Progressing Order written for pt. To ambulate and sit up in chair.

## 2012-05-07 ENCOUNTER — Inpatient Hospital Stay: Admission: RE | Admit: 2012-05-07 | Payer: 59 | Source: Ambulatory Visit

## 2012-05-07 LAB — CBC
MCH: 35.6 pg — ABNORMAL HIGH (ref 26.0–34.0)
MCHC: 34.8 g/dL (ref 30.0–36.0)
Platelets: 171 10*3/uL (ref 150–400)
RDW: 16.8 % — ABNORMAL HIGH (ref 11.5–15.5)

## 2012-05-07 LAB — DIFFERENTIAL
Basophils Absolute: 0 10*3/uL (ref 0.0–0.1)
Basophils Relative: 0 % (ref 0–1)
Eosinophils Absolute: 0.1 10*3/uL (ref 0.0–0.7)
Neutrophils Relative %: 85 % — ABNORMAL HIGH (ref 43–77)

## 2012-05-07 LAB — BASIC METABOLIC PANEL
BUN: 12 mg/dL (ref 6–23)
Calcium: 7.9 mg/dL — ABNORMAL LOW (ref 8.4–10.5)
Chloride: 101 mEq/L (ref 96–112)
Creatinine, Ser: 1.51 mg/dL — ABNORMAL HIGH (ref 0.50–1.35)
GFR calc Af Amer: 55 mL/min — ABNORMAL LOW (ref >90)

## 2012-05-07 NOTE — Progress Notes (Signed)
UR complete 

## 2012-05-07 NOTE — Progress Notes (Signed)
2 Days Post-Op   Subjective: No major complaint wants something to eat.  Foley is out  Objective: Vital signs in last 24 hours: Temp:  [98.9 F (37.2 C)-99.5 F (37.5 C)] 99.4 F (37.4 C) (05/31 1409) Pulse Rate:  [79-83] 79  (05/31 1409) Resp:  [16-20] 20  (05/31 1409) BP: (142-163)/(86-90) 142/86 mmHg (05/31 1409) SpO2:  [97 %-100 %] 99 % (05/31 1409) Last BM Date: 05/05/12  TM99.5, VSS BP up some, HR in 80's Labs stable  Intake/Output from previous day: 05/30 0701 - 05/31 0700 In: 1356 [I.V.:1256] Out: 2400 [Urine:2400] Intake/Output this shift:    General appearance: alert, cooperative and no distress Resp: clear to auscultation bilaterally GI: soft, incisions ok Drain in lower abdominal wound.  BS hypoactive, no flatus  Lab Results:   Basename 05/07/12 0530 05/06/12 0600  WBC 7.0 6.6  HGB 8.8* 8.5*  HCT 25.3* 24.2*  PLT 171 151    BMET  Basename 05/07/12 0530 05/06/12 0600  NA 136 138  K 3.9 4.1  CL 101 104  CO2 26 26  GLUCOSE 116* 123*  BUN 12 15  CREATININE 1.51* 1.44*  CALCIUM 7.9* 7.3*   PT/INR No results found for this basename: LABPROT:2,INR:2 in the last 72 hours   Lab 05/03/12 2104  AST 11  ALT 8  ALKPHOS 68  BILITOT 0.4  PROT 5.8*  ALBUMIN 3.1*     Lipase     Component Value Date/Time   LIPASE 39 11/01/2011 1420     Studies/Results: No results found.  Medications:    . enoxaparin  40 mg Subcutaneous Q24H  . morphine   Intravenous Q4H  . nicotine  14 mg Transdermal Daily    Assessment/Plan 1. Ileostomy, history of rectal cancer.  s/p LAPAROSCOPIC assisted ILEO LOOP OSTOMY CLOSURE - 05/05/12 - Dr. Ezzard Standing   Hepatic nodules. ? Mets?   Needle aspiration of right lobe of liver by Dr. Jacqualine Code - 12/22/2011 - Benign.   But he had firm nodules in both lobes of liver palpable at the time of surgery.  3. Right hydroureter secondary to nephrolithiasis.   Dr. Brunilda Payor explored at surgery, but could not remove stone.  4. Acute/Chronic  renal insufficiency. This continues to improve with hydration..   Creat - 05/07/2012 - 1.51  4. Power port - 03/10/2012 - D. Dowell Hoon  5. Hypertension.  6. Smokes.  7. Anemia -   Hgb - 8.8 - 05/07/2012  8. Thrombocytopenia.   Plts - 171,000 - 05/07/2012    Plan:  Clearance Coots of clears continue IV's and mobilize.    LOS: 7 days    JENNINGS,WILLARD 05/07/2012  Doing well so far.  Taking small amounts of liquids.  As above.  Ovidio Kin, MD, [facility] Surgery Pager: [phone] Office phone:  [phone]

## 2012-05-08 LAB — BASIC METABOLIC PANEL
BUN: 11 mg/dL (ref 6–23)
GFR calc Af Amer: 54 mL/min — ABNORMAL LOW (ref >90)
GFR calc non Af Amer: 46 mL/min — ABNORMAL LOW (ref >90)
Potassium: 4 mEq/L (ref 3.5–5.1)
Sodium: 137 mEq/L (ref 135–145)

## 2012-05-08 LAB — TYPE AND SCREEN
Antibody Screen: NEGATIVE
Unit division: 0
Unit division: 0

## 2012-05-08 NOTE — Progress Notes (Signed)
General Surgery Note  LOS: 8 days  Room - 1306 POD# - 3  Assessment/Plan: 1. Rectal Cancer.   Path - ypT3, ypN2, 4/10 nodes positive. KRAS mutation detected.   Completed neoadjuvant therapy by Drs. Sherrill and Tifton end of Nov/beginning of Dec 2012.   He had LAR - 02/06/2012. Had diverting ileostomy.   CEA - 2.1 on 05/03/2012 (this is down from 6 weeks ago!)  Ileostomy reversal - 05/05/2012 - D. Eula Jaster  Had 3 BM's - to advance diet and probably home in AM.  Will recheck BMP in AM.  2. Hepatic nodules. ? Mets 3. Right hydroureter secondary to nephrolithiasis.   Dr. Brunilda Payor explored at surgery, but could not remove stone.  4. Acute/Chronic renal insufficiency.  This continues to improve with hydration..   Creat - 05/08/2012 - 1.54 4. Power port - 03/10/2012 - D. Finola Rosal  5. Hypertension.  6. Smokes.  7.  Anemia -  Hgb - 8.8 - 05/07/2012  Subjective:  He feels good.  Had 3 BM's.  He's already ready to go home.  But I want to make sure he keeps reg diet down, so probably home in AM. Objective:   Filed Vitals:   05/08/12 0514  BP: 141/81  Pulse: 95  Temp: 98.4 F (36.9 C)  Resp: 18     Intake/Output from previous day:  05/31 0701 - 06/01 0700 In: 2328.7 [I.V.:2328.7] Out: 250 [Urine:250]  Intake/Output this shift:      Physical Exam:   General: WN AA M who is alert and oriented.    HEENT: Normal. Pupils equal. .   Lungs: clear   Abdomen: Soft.  BS present.   Incision:  I removed the wick from the wound.   Neurologic:  Grossly intact to motor and sensory function.   Psychiatric: Has normal mood and affect. Behavior is normal   Lab Results:     Basename 05/07/12 0530 05/06/12 0600  WBC 7.0 6.6  HGB 8.8* 8.5*  HCT 25.3* 24.2*  PLT 171 151    BMET    Basename 05/08/12 0500 05/07/12 0530  NA 137 136  K 4.0 3.9  CL 102 101  CO2 26 26  GLUCOSE 108* 116*  BUN 11 12  CREATININE 1.54* 1.51*  CALCIUM 8.3* 7.9*    PT/INR   No results found for this basename:  LABPROT:2,INR:2 in the last 72 hours  ABG  No results found for this basename: PHART:2,PCO2:2,PO2:2,HCO3:2 in the last 72 hours   Studies/Results:  No results found.   Anti-infectives:   Anti-infectives     Start     Dose/Rate Route Frequency Ordered Stop   05/05/12 2200   cefOXitin (MEFOXIN) 1 g in dextrose 5 % 50 mL IVPB        1 g 100 mL/hr over 30 Minutes Intravenous 4 times per day 05/05/12 2043 05/05/12 2338          Ovidio Kin, MD, FACS Pager: [phone],   [facility] Office: [phone] 05/08/2012

## 2012-05-09 LAB — BASIC METABOLIC PANEL
Chloride: 106 mEq/L (ref 96–112)
Creatinine, Ser: 1.61 mg/dL — ABNORMAL HIGH (ref 0.50–1.35)
GFR calc Af Amer: 51 mL/min — ABNORMAL LOW (ref >90)
Potassium: 4.2 mEq/L (ref 3.5–5.1)

## 2012-05-09 MED ORDER — HEPARIN SOD (PORK) LOCK FLUSH 100 UNIT/ML IV SOLN
INTRAVENOUS | Status: AC
  Filled 2012-05-09: qty 5

## 2012-05-09 NOTE — Progress Notes (Signed)
General Surgery Note  LOS: 9 days  Room - 1306 POD# - 4  Assessment/Plan: 1. Rectal Cancer.   Path - ypT3, ypN2, 4/10 nodes positive. KRAS mutation detected.   Completed neoadjuvant therapy by Drs. Sherrill and Salix end of Nov/beginning of Dec 2012.   [name] [name] had LAR - 02/06/2012. Had diverting ileostomy.   CEA - 2.1 on 05/03/2012 (this is down from 6 weeks ago!)  Ileostomy reversal - 05/05/2012 - D. Kannon Baum  Ready to go home.  Discharge instructions reviewed.  D/C dictated - #[id number]  2. Hepatic nodules. ? Mets 3. Right hydroureter secondary to nephrolithiasis.   Dr. Brunilda Payor explored at surgery, but could not remove stone.  4. Acute/Chronic renal insufficiency.  This continues to improve with hydration..   Creat - 05/09/2012 - 1.61 4. Power port - 03/10/2012 - D. Tawan Degroote  5. Hypertension.  6. Smokes.  7.  Anemia -  Hgb - 8.8 - 05/07/2012  Subjective:  [name] [name] feels good.  Ready to go home.  Objective:   Filed Vitals:   05/09/12 0519  BP: 132/82  Pulse: 84  Temp: 97.9 F (36.6 C)  Resp: 19     Intake/Output from previous day:  06/01 0701 - 06/02 0700 In: 2702.5 [P.O.:1520; I.V.:1182.5] Out: 4 [Urine:4]  Intake/Output this shift:      Physical Exam:   General: WN AA M who is alert and oriented.    HEENT: Normal. Pupils equal. .   Lungs: clear   Abdomen: Soft.  BS present.   Incision:  Look good.  Will remove single stapes.   Neurologic:  Grossly intact to motor and sensory function.   Psychiatric: Has normal mood and affect. Behavior is normal   Lab Results:     Basename 05/07/12 0530  WBC 7.0  HGB 8.8*  HCT 25.3*  PLT 171    BMET    Basename 05/09/12 0510 05/08/12 0500  NA 138 137  K 4.2 4.0  CL 106 102  CO2 25 26  GLUCOSE 112* 108*  BUN 14 11  CREATININE 1.61* 1.54*  CALCIUM 8.1* 8.3*    PT/INR   No results found for this basename: LABPROT:2,INR:2 in the last 72 hours  ABG  No results found for this basename: PHART:2,PCO2:2,PO2:2,HCO3:2 in the last 72  hours   Studies/Results:  No results found.   Anti-infectives:   Anti-infectives     Start     Dose/Rate Route Frequency Ordered Stop   05/05/12 2200   cefOXitin (MEFOXIN) 1 g in dextrose 5 % 50 mL IVPB        1 g 100 mL/hr over 30 Minutes Intravenous 4 times per day 05/05/12 2043 05/05/12 2338          Ovidio Kin, MD, FACS Pager: [phone],   [facility] Office: [phone] 05/09/2012

## 2012-05-09 NOTE — Discharge Instructions (Signed)
Dehydration, Adult Dehydration is when you lose more fluids from the body than you take in. Vital organs like the kidneys, brain, and heart cannot function without a proper amount of fluids and salt. Any loss of fluids from the body can cause dehydration.  CAUSES   Vomiting.   Diarrhea.   Excessive sweating.   Excessive urine output.   Fever.  SYMPTOMS  Mild dehydration  Thirst.   Dry lips.   Slightly dry mouth.  Moderate dehydration  Very dry mouth.   Sunken eyes.   Skin does not bounce back quickly when lightly pinched and released.   Dark urine and decreased urine production.   Decreased tear production.   Headache.  Severe dehydration  Very dry mouth.   Extreme thirst.   Rapid, weak pulse (more than 100 beats per minute at rest).   Cold hands and feet.   Not able to sweat in spite of heat and temperature.   Rapid breathing.   Blue lips.   Confusion and lethargy.   Difficulty being awakened.   Minimal urine production.   No tears.  DIAGNOSIS  Your caregiver will diagnose dehydration based on your symptoms and your exam. Blood and urine tests will help confirm the diagnosis. The diagnostic evaluation should also identify the cause of dehydration. TREATMENT  Treatment of mild or moderate dehydration can often be done at home by increasing the amount of fluids that you drink. It is best to drink small amounts of fluid more often. Drinking too much at one time can make vomiting worse. Refer to the home care instructions below. Severe dehydration needs to be treated at the hospital where you will probably be given intravenous (IV) fluids that contain water and electrolytes. HOME CARE INSTRUCTIONS   Ask your caregiver about specific rehydration instructions.   Drink enough fluids to keep your urine clear or pale yellow.   Drink small amounts frequently if you have nausea and vomiting.   Eat as you normally do.   Avoid:   Foods or drinks high in  sugar.   Carbonated drinks.   Juice.   Extremely hot or cold fluids.   Drinks with caffeine.   Fatty, greasy foods.   Alcohol.   Tobacco.   Overeating.   Gelatin desserts.   Wash your hands well to avoid spreading bacteria and viruses.   Only take over-the-counter or prescription medicines for pain, discomfort, or fever as directed by your caregiver.   Ask your caregiver if you should continue all prescribed and over-the-counter medicines.   Keep all follow-up appointments with your caregiver.  SEEK MEDICAL CARE IF:  You have abdominal pain and it increases or stays in one area (localizes).   You have a rash, stiff neck, or severe headache.   You are irritable, sleepy, or difficult to awaken.   You are weak, dizzy, or extremely thirsty.  SEEK IMMEDIATE MEDICAL CARE IF:   You are unable to keep fluids down or you get worse despite treatment.   You have frequent episodes of vomiting or diarrhea.   You have blood or green matter (bile) in your vomit.   You have blood in your stool or your stool looks black and tarry.   You have not urinated in 6 to 8 hours, or you have only urinated a small amount of very dark urine.   You have a fever.   You faint.  MAKE SURE YOU:   Understand these instructions.   Will watch your condition.     Will get help right away if you are not doing well or get worse.  Document Released: 11/24/2005 Document Revised: 11/13/2011 Document Reviewed: 07/14/2011 [facility] Patient Information 2012 [place], [place].  [facility] - DISCHARGE INSTRUCTIONS TO PATIENT  Activity:  Driving - 2 to 3 days, if feeling well.   Lifting - nothing > 15 pounds for 2 weeks.  Wound Care:   May shower and get incisions wet.    Diet:  As tolerated  Follow up appointment:  Call Dr. Allene Pyo office [facility]) at [phone] for an appointment in 10 - 14 days  Medications and dosages:  Resume your home medications.  You have a  prescription for:  No new prescriptions.  Call Dr. Ezzard Standing or his office  [phone]) if you have:  Temperature greater than 100.4,  Persistent nausea and vomiting,  Severe uncontrolled pain,  Redness, tenderness, or signs of infection (pain, swelling, redness, odor or green/yellow discharge around the site),  Difficulty breathing, headache or visual disturbances,  Any other questions or concerns you may have after discharge.  In an emergency, call 911 or go to an Emergency Department at a nearby hospital.

## 2012-05-09 NOTE — Discharge Summary (Signed)
[name], [name]              ACCOUNT NO.:  [id number]  MEDICAL RECORD NO.:  [id number]  LOCATION:  1306                         FACILITY:  [facility]  PHYSICIAN:  Sandria Bales. [name] [name], M.D.  DATE OF BIRTH:  [birth year]  DATE OF ADMISSION:  04/30/2012 DATE OF DISCHARGE:  05/09/2012                              DISCHARGE SUMMARY   DISCHARGE DIAGNOSES: 1. Rectal cancer, __________ nodes positive.  Completed neoadjuvant     chemotherapy in December 2012.  Had low anterior section on February 06, 2012. 2. Ileostomy reversal by Dr. Ovidio [name] on May 05, 2012. 3. Hepatic nodules, probable metastasis. 4. Right hydroureter secondary to nephrolithiasis, followed by Dr.     Su [name]. 5. Acute and chronic renal insufficiency with improvements to     creatinine to 1.61 on May 09, 2012. 6. His PowerPort placed by Dr. Ezzard [name] in April 2013. 7. Hypertension. 8. Smokes cigarettes. 9. He has anemia of chronic disease.  OPERATIONS: Ileostomy reversal by Dr. Ovidio [name] on May 05, 2012.  HISTORY OF PRESENT ILLNESS:  [name] [name] is a 61 year old African American male, who is followed by [name] [name].  He was found to have a rectal cancer by Dr. Sheryn [name] on colonoscopy in September 2012.  He underwent neoadjuvant chemo radiation supervised by Dr. Mancel [name] and Dr. Margaretmary [name].  On February 06, 2012, I did a low anterior resection with a diverting ileostomy.  [name] [name] was then started on chemotherapy by Dr. Mancel [name] postoperatively, but he struggled with the chemotherapy and ileostomy output.  His creatinine climbed up to 4.26 on Apr 29, 2012. He has seen Dr. Casimiro [name], who agreed that dehydration was the primary contributor to his renal deteriation.  Dr. Truett [name] has agreed to admit [name] [name] to the hospital on Apr 30, 2012, to rehydrate him, try to correct his renal function, and reverse his ostomy during that admission.  PAST MEDICAL HISTORY: 1. Significant that he does  have hypertension. 2. He smokes cigarettes, knows it is bad for his health. 3. He has a history of some sinus issues which bother him a lot of the     time.  HOSPITAL COURSE:  He was admitted by [name] [name], the hospitalist, placed on IV fluids and rehydrated.  By May 04, 2012, he got his creatinine in the 1.92.  I did a barium enema that showed patent anastomosis with no evidence of compromise of his prior low anterior section surgery and he appeared to be ready for surgery.  On May 05, 2012, he was taken to the operating room where he underwent a laparoscopic-assisted ileal loop closure by Dr. Ovidio [name].  Postoperatively he did well.  On the first postoperative day, his hemoglobin was 8.5.  His white blood count was 6600, his creatinine was 1.44.  He was started on liquids on the third postoperative day and his diet was advanced.    He is now 4 days postop.  He is tolerating a diet. He is afebrile.  His creatinine is 1.61 and he is ready for discharge.  DISCHARGE INSTRUCTIONS:  He can be on a regular diet.   He is encouraged  to really push fluids to stay hydrated.   He is not on any pain medicine.  He said he will resume his other home medications.   He will be back in touch with me in 10 days or 2 weeks to remove his remaining staples, and be in touch with Dr. Mancel [name] for followup of his colon cancer.  DISCHARGE CONDITION:  Good.  FINAL PATHOLOGY:  His final pathology on his ileostomy was benign.   Sandria Bales. [name] [name], M.D., FACS   DHN/MEDQ  D:  05/09/2012  T:  05/09/2012  Job:  [id number]  cc:   Stacie Glaze, MD [address] [place] [address]  Mindi Slicker Lowell Guitar, M.D. Fax: [phone]  Oneita Hurt, M.D. Fax: [phone]  Vania Rea. Jarold Motto, MD, FACG, FACP, FAGA [address] [place] [address]  Dr. Gardiner Ramus, M.D. Fax: [phone]

## 2012-05-09 NOTE — Progress Notes (Signed)
Review of orders revealed order for 3 units of PRBC's on 05/05/12. Review of MD documentation from 5/30 revealed plans to transfuse only 1 unit of PRBCs. One unit was infused on 5/29. Other two units remain pending from that original order date.

## 2012-05-10 ENCOUNTER — Other Ambulatory Visit: Payer: Self-pay | Admitting: *Deleted

## 2012-05-11 ENCOUNTER — Telehealth: Payer: Self-pay | Admitting: Oncology

## 2012-05-11 NOTE — Telephone Encounter (Signed)
called pt with 6/17 appt   aom

## 2012-05-19 ENCOUNTER — Ambulatory Visit (INDEPENDENT_AMBULATORY_CARE_PROVIDER_SITE_OTHER): Payer: 59 | Admitting: Surgery

## 2012-05-19 ENCOUNTER — Encounter (INDEPENDENT_AMBULATORY_CARE_PROVIDER_SITE_OTHER): Payer: Self-pay | Admitting: Surgery

## 2012-05-19 VITALS — BP 148/82 | HR 78 | Temp 99.0°F | Resp 18 | Ht 70.0 in | Wt 161.5 lb

## 2012-05-19 DIAGNOSIS — C20 Malignant neoplasm of rectum: Secondary | ICD-10-CM

## 2012-05-19 NOTE — Progress Notes (Signed)
Name:  [name] [name] MRN:  [id number]   He did well with the ileostomy reversal 05/05/2012.  In fact, he says this is the best he has felt in over 6 months. I removed his remaining staples. His incisions are well healed.  He showed me a sebaceous cyst of his right back.  He is going to see Dr. Truett Perna back in a few weeks to discuss the restart of chemotx. The optimistic thing is that his CEA is now down in the normal range.    [see full note 04/30/2012]  I will see him back in 6 months.  Ovidio Kin, MD, [facility] Pager: [phone] Office phone:  [phone]

## 2012-05-24 ENCOUNTER — Telehealth: Payer: Self-pay | Admitting: Oncology

## 2012-05-24 ENCOUNTER — Ambulatory Visit: Payer: 59 | Admitting: Nurse Practitioner

## 2012-05-24 NOTE — Telephone Encounter (Signed)
S/w pt today re new appt for 6/19 @ 9:45 am - LT out 6/17.

## 2012-05-24 NOTE — Telephone Encounter (Signed)
Per BS moved f/u from 6/19 to 6/20 instead. S/w pt today re new d/t. lmonvm @ home but then s/w pt on cell.

## 2012-05-26 ENCOUNTER — Ambulatory Visit: Payer: 59 | Admitting: Nurse Practitioner

## 2012-05-27 ENCOUNTER — Ambulatory Visit (HOSPITAL_BASED_OUTPATIENT_CLINIC_OR_DEPARTMENT_OTHER): Payer: 59 | Admitting: Nurse Practitioner

## 2012-05-27 ENCOUNTER — Telehealth: Payer: Self-pay | Admitting: Oncology

## 2012-05-27 ENCOUNTER — Ambulatory Visit (HOSPITAL_BASED_OUTPATIENT_CLINIC_OR_DEPARTMENT_OTHER): Payer: 59

## 2012-05-27 VITALS — BP 156/88 | HR 90 | Temp 97.5°F | Ht 70.0 in | Wt 163.5 lb

## 2012-05-27 DIAGNOSIS — C787 Secondary malignant neoplasm of liver and intrahepatic bile duct: Secondary | ICD-10-CM

## 2012-05-27 DIAGNOSIS — C20 Malignant neoplasm of rectum: Secondary | ICD-10-CM

## 2012-05-27 LAB — BASIC METABOLIC PANEL
CO2: 27 mEq/L (ref 19–32)
Calcium: 8.9 mg/dL (ref 8.4–10.5)
Sodium: 142 mEq/L (ref 135–145)

## 2012-05-27 NOTE — Telephone Encounter (Signed)
Talked to pt gave him appt for chemo on 7/3 and DC pump on 7/5th , pt dont want to carry bag around during 4th of July, pt will call nurse to move chemo date otherwise pt is aware all  appts

## 2012-05-28 ENCOUNTER — Other Ambulatory Visit: Payer: Self-pay | Admitting: *Deleted

## 2012-05-28 ENCOUNTER — Telehealth: Payer: Self-pay | Admitting: *Deleted

## 2012-05-28 NOTE — Progress Notes (Signed)
OFFICE PROGRESS NOTE  Interval history:  [name] [name] returns as scheduled. He underwent an ileostomy reversal procedure by Dr. Ezzard Standing on 05/05/2012. Of note, there was no evidence of metastatic disease in the liver per Dr. Allene Pyo inspection at the time of the ileostomy reversal. He was discharged home on 05/09/2012.  [name] [name] reports that he feels well. He has a good appetite. He is gaining weight. He has a good energy level. He has returned to work. Bowels moving normally. No pain with bowel movements. No rectal bleeding. No nausea or vomiting.   Objective: Blood pressure 156/88, pulse 90, temperature 97.5 F (36.4 C), temperature source Oral, height 5\' 10"  (1.778 m), weight 163 lb 8 oz (74.163 kg).  Oropharynx is without thrush or ulceration. Lungs are clear. Regular cardiac rhythm. Port-A-Cath site is without erythema. Abdomen is soft and nontender. No hepatomegaly. Well-healed abdominal incision. Extremities are without edema.  Lab Results: Lab Results  Component Value Date   WBC 7.0 05/07/2012   HGB 8.8* 05/07/2012   HCT 25.3* 05/07/2012   MCV 102.4* 05/07/2012   PLT 171 05/07/2012    Chemistry:    Chemistry      Component Value Date/Time   NA 142 05/27/2012 1617   K 2.8* 05/27/2012 1617   CL 106 05/27/2012 1617   CO2 27 05/27/2012 1617   BUN 23 05/27/2012 1617   CREATININE 1.32 05/27/2012 1617      Component Value Date/Time   CALCIUM 8.9 05/27/2012 1617   ALKPHOS 68 05/03/2012 2104   AST 11 05/03/2012 2104   ALT 8 05/03/2012 2104   BILITOT 0.4 05/03/2012 2104       Studies/Results: Dg Colon W/water Sol Cm  05/03/2012  Clinical history:  Rectal cancer diagnosed in September 1012 treated with diverting ileostomy and resection of the tumor.  Water-soluble contrast examination of the colon  Findings:  The entire colon was filled in a normal retrograde manner.  There are a few small  diverticula in the distal sigmoid portion of the colon.  The remainder of the colon appears normal.  The presence of retained stool makes it impossible to exclude small polyps.  There is no anastomotic stricture.  Impression:  Scattered diverticula in the distal colon.  Otherwise, normal exam.  Original Report Authenticated By: Gwynn Burly, M.D.    Medications: I have reviewed the patient's current medications.  Assessment/Plan:  1. Metastatic rectal cancer status post biopsy of rectal mass 09/03/2011 with pathology showing invasive adenocarcinoma. Staging CT scans 09/03/2011 showed a 10 mm hypodense rounded lesion in left lateral hepatic lobe, similar 6 mm lesion in the superior right hepatic lobe and a smaller subcapsular lesion in the lateral right hepatic lobe; rectal mass within the lumen of the bowel emanating from the left wall measuring 4.9 x 3.5 cm and a mass within the low left perirectal fat measuring 2.3 x 3.2 cm consistent with local extension of carcinoma. Staging PET scan 09/18/2011 showed a 5.1 cm distal sigmoid/rectal mass with max SUV 19.2 with local extension into the left perirectal fat with max SUV 7.7. At least 2 suspected hepatic metastases with max SUV 6.7 in the lateral segment left hepatic lobe and max SUV 4.1 in the lateral right hepatic dome. Additional tiny hypodensities in the liver may be cysts; patchy/nodular opacity in the left lower lobe with max SUV 3.5 worrisome for pulmonary metastasis, less likely infectious. He began radiation and concurrent Xeloda chemotherapy on 09/29/2011. He completed radiation on 11/06/2011. MRI of the  liver on 12/08/2011 showed 2 hypermetabolic liver lesions, similar in size to 09/03/2011. 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 Equivocal 7 mm lesion in the inferior right hepatic lobe. No evidence of extrahepatic metastasis within the abdomen. An ultrasound-guided biopsy of a small left hepatic lesion on 12/23/2011 was nondiagnostic. He underwent a low anterior resection with loop  ileostomy on 02/06/2012 with final pathology showing a 3.5 cm invasive adenocarcinoma with abundant extracellular mucin invading through the muscularis propria into pericolonic fatty tissue; 4 of 10 pericolonic lymph nodes were positive for metastatic carcinoma; resection margins were clear (ypT3,ypN2). He began FOLFOX chemotherapy on 03/16/2012. He has completed 2 cycles of FOLFOX. 2. K-ras mutation detected. 3. Rectal pain and bleeding secondary to #1. Improved. 4. Frequent loose stools secondary to #1. Improved. 5. Hypertension. 6. Tobacco use. 7. CT scan 09/03/2011 with chronic right hydronephrosis secondary to an obstructing calculus in the mid right ureter. He is followed by Dr. Brunilda Payor.  8. Status post cystoscopy, right retrograde pyelogram, ureteroscopy and insertion of right double-J stent 11/28/2011. The double-J stent was replaced 02/06/2012. The stent has been removed. 9. Elevated BUN/creatinine. Improved with hydration. He is followed by Dr. Lowell Guitar. 10. Status post ultrasound-guided fine-needle aspiration of small left hepatic lesion 12/22/2011. The pathology revealed no malignancy. 11. Malaise and weight loss. Resolved. 12. High output ileostomy. Partially improved with Imodium. Status post ileostomy reversal 05/05/2012.  Disposition-[name] [name] appears well following the ileostomy reversal procedure. Dr. Truett Perna recommends resuming FOLFOX chemotherapy with plans to complete a total of 6 months of therapy including initial radiation and Xeloda. [name] [name] is in agreement. He will return for the next cycle on 06/07/2012. He will return for a followup visit and chemotherapy on 06/21/2012. He will contact the office in the interim with any problems.  Plan reviewed with Dr. Truett Perna.  Lonna Cobb ANP/GNP-BC

## 2012-05-28 NOTE — Telephone Encounter (Signed)
Pt reports that Dr.Sherrill wanted him to have chemo a certain day & scheduler said those dates aren't available & scheduled for 7/3 & 7/5 & he has planned a vacation for those dates & will be out of town.  He wants to know if chemo can be scheduled for the following week.  Will discuss with Dr. Truett Perna.

## 2012-06-02 ENCOUNTER — Telehealth: Payer: Self-pay | Admitting: *Deleted

## 2012-06-02 ENCOUNTER — Encounter (INDEPENDENT_AMBULATORY_CARE_PROVIDER_SITE_OTHER): Payer: Self-pay

## 2012-06-02 NOTE — Telephone Encounter (Signed)
I have called to check appt to move to this week.  JMW

## 2012-06-07 ENCOUNTER — Other Ambulatory Visit: Payer: 59 | Admitting: Lab

## 2012-06-08 ENCOUNTER — Inpatient Hospital Stay: Payer: 59

## 2012-06-08 ENCOUNTER — Other Ambulatory Visit: Payer: Self-pay | Admitting: Oncology

## 2012-06-09 ENCOUNTER — Inpatient Hospital Stay: Payer: 59

## 2012-06-09 ENCOUNTER — Other Ambulatory Visit: Payer: 59 | Admitting: Lab

## 2012-06-09 ENCOUNTER — Encounter (INDEPENDENT_AMBULATORY_CARE_PROVIDER_SITE_OTHER): Payer: 59 | Admitting: Surgery

## 2012-06-13 ENCOUNTER — Other Ambulatory Visit: Payer: Self-pay | Admitting: Oncology

## 2012-06-14 ENCOUNTER — Other Ambulatory Visit (HOSPITAL_BASED_OUTPATIENT_CLINIC_OR_DEPARTMENT_OTHER): Payer: 59 | Admitting: Lab

## 2012-06-14 ENCOUNTER — Ambulatory Visit (HOSPITAL_BASED_OUTPATIENT_CLINIC_OR_DEPARTMENT_OTHER): Payer: 59

## 2012-06-14 ENCOUNTER — Other Ambulatory Visit: Payer: BC Managed Care – PPO | Admitting: Lab

## 2012-06-14 ENCOUNTER — Ambulatory Visit: Payer: BC Managed Care – PPO | Admitting: Nurse Practitioner

## 2012-06-14 VITALS — BP 132/72 | HR 87 | Temp 97.9°F

## 2012-06-14 DIAGNOSIS — C20 Malignant neoplasm of rectum: Secondary | ICD-10-CM

## 2012-06-14 DIAGNOSIS — Z5111 Encounter for antineoplastic chemotherapy: Secondary | ICD-10-CM

## 2012-06-14 DIAGNOSIS — C787 Secondary malignant neoplasm of liver and intrahepatic bile duct: Secondary | ICD-10-CM

## 2012-06-14 LAB — COMPREHENSIVE METABOLIC PANEL
Albumin: 3.2 g/dL — ABNORMAL LOW (ref 3.5–5.2)
BUN: 25 mg/dL — ABNORMAL HIGH (ref 6–23)
CO2: 24 mEq/L (ref 19–32)
Calcium: 8.7 mg/dL (ref 8.4–10.5)
Chloride: 105 mEq/L (ref 96–112)
Glucose, Bld: 115 mg/dL — ABNORMAL HIGH (ref 70–99)
Potassium: 3.3 mEq/L — ABNORMAL LOW (ref 3.5–5.3)

## 2012-06-14 LAB — CBC WITH DIFFERENTIAL/PLATELET
Basophils Absolute: 0 10*3/uL (ref 0.0–0.1)
EOS%: 1 % (ref 0.0–7.0)
Eosinophils Absolute: 0.1 10*3/uL (ref 0.0–0.5)
HCT: 33.7 % — ABNORMAL LOW (ref 38.4–49.9)
HGB: 11.6 g/dL — ABNORMAL LOW (ref 13.0–17.1)
MCH: 34.3 pg — ABNORMAL HIGH (ref 27.2–33.4)
MONO#: 0.4 10*3/uL (ref 0.1–0.9)
NEUT%: 85.3 % — ABNORMAL HIGH (ref 39.0–75.0)
lymph#: 0.6 10*3/uL — ABNORMAL LOW (ref 0.9–3.3)

## 2012-06-14 MED ORDER — LEUCOVORIN CALCIUM INJECTION 350 MG
400.0000 mg/m2 | Freq: Once | INTRAVENOUS | Status: AC
  Administered 2012-06-14: 756 mg via INTRAVENOUS
  Filled 2012-06-14: qty 37.8

## 2012-06-14 MED ORDER — DEXTROSE 5 % IV SOLN
Freq: Once | INTRAVENOUS | Status: AC
  Administered 2012-06-14: 10:00:00 via INTRAVENOUS

## 2012-06-14 MED ORDER — DEXAMETHASONE SODIUM PHOSPHATE 10 MG/ML IJ SOLN
10.0000 mg | Freq: Once | INTRAMUSCULAR | Status: AC
  Administered 2012-06-14: 10 mg via INTRAVENOUS

## 2012-06-14 MED ORDER — FLUOROURACIL CHEMO INJECTION 2.5 GM/50ML
400.0000 mg/m2 | Freq: Once | INTRAVENOUS | Status: AC
  Administered 2012-06-14: 750 mg via INTRAVENOUS
  Filled 2012-06-14: qty 15

## 2012-06-14 MED ORDER — ONDANSETRON 8 MG/50ML IVPB (CHCC)
8.0000 mg | Freq: Once | INTRAVENOUS | Status: AC
  Administered 2012-06-14: 8 mg via INTRAVENOUS

## 2012-06-14 MED ORDER — OXALIPLATIN CHEMO INJECTION 100 MG/20ML
65.0000 mg/m2 | Freq: Once | INTRAVENOUS | Status: AC
  Administered 2012-06-14: 125 mg via INTRAVENOUS
  Filled 2012-06-14: qty 25

## 2012-06-14 MED ORDER — SODIUM CHLORIDE 0.9 % IV SOLN
2400.0000 mg/m2 | INTRAVENOUS | Status: DC
  Administered 2012-06-14: 4550 mg via INTRAVENOUS
  Filled 2012-06-14: qty 91

## 2012-06-14 NOTE — Patient Instructions (Signed)
[facility] Discharge Instructions for Patients Receiving Chemotherapy  Today you received the following chemotherapy agents Oxaliplatin, Leucovorin and 5FU  To help prevent nausea and vomiting after your treatment, we encourage you to take your nausea medication as prescribed.   If you develop nausea and vomiting that is not controlled by your nausea medication, call the clinic. If it is after clinic hours your family physician or the after hours number for the clinic or go to the Emergency Department.   BELOW ARE SYMPTOMS THAT SHOULD BE REPORTED IMMEDIATELY:  *FEVER GREATER THAN 100.5 F  *CHILLS WITH OR WITHOUT FEVER  NAUSEA AND VOMITING THAT IS NOT CONTROLLED WITH YOUR NAUSEA MEDICATION  *UNUSUAL SHORTNESS OF BREATH  *UNUSUAL BRUISING OR BLEEDING  TENDERNESS IN MOUTH AND THROAT WITH OR WITHOUT PRESENCE OF ULCERS  *URINARY PROBLEMS  *BOWEL PROBLEMS  UNUSUAL RASH Items with * indicate a potential emergency and should be followed up as soon as possible.  One of the nurses will contact you 24 hours after your treatment. Please let the nurse know about any problems that you may have experienced. Feel free to call the clinic you have any questions or concerns. The clinic phone number is [phone].   I have been informed and understand all the instructions given to me. I know to contact the clinic, my physician, or go to the Emergency Department if any problems should occur. I do not have any questions at this time, but understand that I may call the clinic during office hours   should I have any questions or need assistance in obtaining follow up care.    __________________________________________  _____________  __________ Signature of Patient or Authorized Representative            Date                   Time    __________________________________________ Nurse's Signature    

## 2012-06-16 ENCOUNTER — Ambulatory Visit (HOSPITAL_BASED_OUTPATIENT_CLINIC_OR_DEPARTMENT_OTHER): Payer: 59

## 2012-06-16 VITALS — BP 120/70 | HR 90 | Temp 98.5°F

## 2012-06-16 DIAGNOSIS — C20 Malignant neoplasm of rectum: Secondary | ICD-10-CM

## 2012-06-16 MED ORDER — SODIUM CHLORIDE 0.9 % IJ SOLN
10.0000 mL | INTRAMUSCULAR | Status: DC | PRN
  Administered 2012-06-16: 10 mL
  Filled 2012-06-16: qty 10

## 2012-06-16 MED ORDER — HEPARIN SOD (PORK) LOCK FLUSH 100 UNIT/ML IV SOLN
500.0000 [IU] | Freq: Once | INTRAVENOUS | Status: AC | PRN
  Administered 2012-06-16: 500 [IU]
  Filled 2012-06-16: qty 5

## 2012-06-16 NOTE — Patient Instructions (Signed)
Call MD for problems 

## 2012-06-19 ENCOUNTER — Other Ambulatory Visit: Payer: Self-pay | Admitting: Oncology

## 2012-06-21 ENCOUNTER — Other Ambulatory Visit (HOSPITAL_BASED_OUTPATIENT_CLINIC_OR_DEPARTMENT_OTHER): Payer: 59 | Admitting: Lab

## 2012-06-21 ENCOUNTER — Ambulatory Visit (HOSPITAL_BASED_OUTPATIENT_CLINIC_OR_DEPARTMENT_OTHER): Payer: 59 | Admitting: Oncology

## 2012-06-21 ENCOUNTER — Telehealth: Payer: Self-pay | Admitting: Oncology

## 2012-06-21 ENCOUNTER — Inpatient Hospital Stay: Payer: 59

## 2012-06-21 VITALS — BP 152/81 | HR 85 | Temp 99.2°F | Ht 70.0 in | Wt 164.7 lb

## 2012-06-21 DIAGNOSIS — C787 Secondary malignant neoplasm of liver and intrahepatic bile duct: Secondary | ICD-10-CM

## 2012-06-21 DIAGNOSIS — G589 Mononeuropathy, unspecified: Secondary | ICD-10-CM

## 2012-06-21 DIAGNOSIS — C20 Malignant neoplasm of rectum: Secondary | ICD-10-CM

## 2012-06-21 LAB — CBC WITH DIFFERENTIAL/PLATELET
Basophils Absolute: 0 10*3/uL (ref 0.0–0.1)
EOS%: 1.5 % (ref 0.0–7.0)
Eosinophils Absolute: 0.1 10*3/uL (ref 0.0–0.5)
HGB: 11.8 g/dL — ABNORMAL LOW (ref 13.0–17.1)
NEUT#: 3.5 10*3/uL (ref 1.5–6.5)
RDW: 13.8 % (ref 11.0–14.6)
lymph#: 0.9 10*3/uL (ref 0.9–3.3)
nRBC: 0 % (ref 0–0)

## 2012-06-21 NOTE — Telephone Encounter (Signed)
Gave pt appt for July  And August 2013 lab, chemo and ML

## 2012-06-21 NOTE — Progress Notes (Signed)
[facility]    OFFICE PROGRESS NOTE   INTERVAL HISTORY:   He returns as scheduled. He completed another cycle of chemotherapy on 06/14/2012. Cold sensitivity lasting for several days after chemotherapy. No nausea or diarrhea. He complains of erectile dysfunction for the past 2-3 months.  Objective:  Vital signs in last 24 hours:  Blood pressure 152/81, pulse 85, temperature 99.2 F (37.3 C), temperature source Oral, height 5\' 10"  (1.778 m), weight 164 lb 11.2 oz (74.707 kg).    HEENT: No thrush or ulcers Resp: Lungs clear bilaterally Cardio: Regular rate and rhythm GI: No hepatosplenomegaly, nontender Vascular: No leg edema Neuro: Mild decrease in vibratory sense at the fingertips bilaterally   Portacath/PICC-without erythema  Lab Results:  Lab Results  Component Value Date   WBC 4.7 06/21/2012   HGB 11.8* 06/21/2012   HCT 33.5* 06/21/2012   MCV 97.4 06/21/2012   PLT 188 06/21/2012   ANC 3.5   Medications: I have reviewed the patient's current medications.  Assessment/Plan: 1. Metastatic rectal cancer status post biopsy of rectal mass 09/03/2011 with pathology showing invasive adenocarcinoma. Staging CT scans 09/03/2011 showed a 10 mm hypodense rounded lesion in left lateral hepatic lobe, similar 6 mm lesion in the superior right hepatic lobe and a smaller subcapsular lesion in the lateral right hepatic lobe; rectal mass within the lumen of the bowel emanating from the left wall measuring 4.9 x 3.5 cm and a mass within the low left perirectal fat measuring 2.3 x 3.2 cm consistent with local extension of carcinoma. Staging PET scan 09/18/2011 showed a 5.1 cm distal sigmoid/rectal mass with max SUV 19.2 with local extension into the left perirectal fat with max SUV 7.7. At least 2 suspected hepatic metastases with max SUV 6.7 in the lateral segment left hepatic lobe and max SUV 4.1 in the lateral right hepatic dome. Additional tiny hypodensities in the liver may  be cysts; patchy/nodular opacity in the left lower lobe with max SUV 3.5 worrisome for pulmonary metastasis, less likely infectious. He began radiation and concurrent Xeloda chemotherapy on 09/29/2011. He completed radiation on 11/06/2011. MRI of the liver on 12/08/2011 showed 2 hypermetabolic liver lesions, similar in size to 09/03/2011. 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 Equivocal 7 mm lesion in the inferior right hepatic lobe. No evidence of extrahepatic metastasis within the abdomen. An ultrasound-guided biopsy of a small left hepatic lesion on 12/23/2011 was nondiagnostic. He underwent a low anterior resection with loop ileostomy on 02/06/2012 with final pathology showing a 3.5 cm invasive adenocarcinoma with abundant extracellular mucin invading through the muscularis propria into pericolonic fatty tissue; 4 of 10 pericolonic lymph nodes were positive for metastatic carcinoma; resection margins were clear (ypT3,ypN2). He began FOLFOX chemotherapy on 03/16/2012. He has completed 2 cycles of FOLFOX. -Status post ileostomy reversal 05/05/2012             -FOLFOX was resumed on 06/14/2012 2. K-ras mutation detected. 3. Rectal pain and bleeding secondary to #1. Improved. 4. Frequent loose stools secondary to #1. Improved. 5. Hypertension. 6. Tobacco use. 7. CT scan 09/03/2011 with chronic right hydronephrosis secondary to an obstructing calculus in the mid right ureter. He is followed by Dr. Brunilda Payor.  8. Status post cystoscopy, right retrograde pyelogram, ureteroscopy and insertion of right double-J stent 11/28/2011. The double-J stent was replaced 02/06/2012. The stent has been removed. 9. Elevated BUN/creatinine. Improved with hydration. He is followed by Dr. Lowell Guitar. 10. Status post  ultrasound-guided fine-needle aspiration of small left hepatic lesion 12/22/2011. The pathology revealed no malignancy. 11. Malaise and weight loss.  Resolved. 12. High output ileostomy. Partially improved with Imodium. Status post ileostomy reversal 05/05/2012. 13. Erectile dysfunction-I recommended he followup with Dr. 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 Amos completed a third cycle of FOLFOX on 06/14/2012. The CEA was normal on may 27th 2013 and there was no evidence of metastatic disease at the time of surgery may 29th 2013. The plan is to continue a six-month course of FOLFOX.  Mr. Heninger will return for the next cycle of FOLFOX (cycle 4) on 06/28/2012. He will return for an office visit and chemotherapy on 07/12/2012.   Thornton Papas, MD  06/21/2012  4:53 PM

## 2012-06-25 ENCOUNTER — Other Ambulatory Visit: Payer: Self-pay | Admitting: *Deleted

## 2012-06-28 ENCOUNTER — Ambulatory Visit (HOSPITAL_BASED_OUTPATIENT_CLINIC_OR_DEPARTMENT_OTHER): Payer: 59

## 2012-06-28 ENCOUNTER — Other Ambulatory Visit (HOSPITAL_BASED_OUTPATIENT_CLINIC_OR_DEPARTMENT_OTHER): Payer: 59 | Admitting: Lab

## 2012-06-28 VITALS — BP 159/81 | HR 90 | Temp 97.8°F

## 2012-06-28 DIAGNOSIS — C20 Malignant neoplasm of rectum: Secondary | ICD-10-CM

## 2012-06-28 DIAGNOSIS — Z5111 Encounter for antineoplastic chemotherapy: Secondary | ICD-10-CM

## 2012-06-28 LAB — COMPREHENSIVE METABOLIC PANEL
ALT: 7 U/L (ref 0–53)
CO2: 21 mEq/L (ref 19–32)
Calcium: 8.6 mg/dL (ref 8.4–10.5)
Chloride: 112 mEq/L (ref 96–112)
Potassium: 3.5 mEq/L (ref 3.5–5.3)
Sodium: 143 mEq/L (ref 135–145)
Total Protein: 6.5 g/dL (ref 6.0–8.3)

## 2012-06-28 LAB — CBC WITH DIFFERENTIAL/PLATELET
Eosinophils Absolute: 0.1 10*3/uL (ref 0.0–0.5)
HCT: 31.1 % — ABNORMAL LOW (ref 38.4–49.9)
LYMPH%: 19.3 % (ref 14.0–49.0)
MONO#: 0.4 10*3/uL (ref 0.1–0.9)
NEUT#: 3.1 10*3/uL (ref 1.5–6.5)
Platelets: 162 10*3/uL (ref 140–400)
RBC: 3.18 10*6/uL — ABNORMAL LOW (ref 4.20–5.82)
WBC: 4.4 10*3/uL (ref 4.0–10.3)
lymph#: 0.9 10*3/uL (ref 0.9–3.3)

## 2012-06-28 MED ORDER — FLUOROURACIL CHEMO INJECTION 2.5 GM/50ML
400.0000 mg/m2 | Freq: Once | INTRAVENOUS | Status: AC
  Administered 2012-06-28: 750 mg via INTRAVENOUS
  Filled 2012-06-28: qty 15

## 2012-06-28 MED ORDER — LEUCOVORIN CALCIUM INJECTION 350 MG
400.0000 mg/m2 | Freq: Once | INTRAVENOUS | Status: AC
  Administered 2012-06-28: 756 mg via INTRAVENOUS
  Filled 2012-06-28: qty 37.8

## 2012-06-28 MED ORDER — OXALIPLATIN CHEMO INJECTION 100 MG/20ML
65.0000 mg/m2 | Freq: Once | INTRAVENOUS | Status: AC
  Administered 2012-06-28: 125 mg via INTRAVENOUS
  Filled 2012-06-28: qty 25

## 2012-06-28 MED ORDER — ONDANSETRON 8 MG/50ML IVPB (CHCC)
8.0000 mg | Freq: Once | INTRAVENOUS | Status: AC
  Administered 2012-06-28: 8 mg via INTRAVENOUS

## 2012-06-28 MED ORDER — DEXAMETHASONE SODIUM PHOSPHATE 10 MG/ML IJ SOLN
10.0000 mg | Freq: Once | INTRAMUSCULAR | Status: AC
  Administered 2012-06-28: 10 mg via INTRAVENOUS

## 2012-06-28 MED ORDER — SODIUM CHLORIDE 0.9 % IV SOLN
2400.0000 mg/m2 | INTRAVENOUS | Status: DC
  Administered 2012-06-28: 4550 mg via INTRAVENOUS
  Filled 2012-06-28: qty 91

## 2012-06-28 MED ORDER — DEXTROSE 5 % IV SOLN
Freq: Once | INTRAVENOUS | Status: AC
  Administered 2012-06-28: 14:00:00 via INTRAVENOUS

## 2012-06-28 NOTE — Patient Instructions (Addendum)
[facility] Discharge Instructions for Patients Receiving Chemotherapy  Today you received the following chemotherapy agents :  Oxaliplatin, 5 FU, Leucovorin  To help prevent nausea and vomiting after your treatment, we encourage you to take your nausea medication as instructed by your physician and as needed to help with nausea/vomiting.    If you develop nausea and vomiting that is not controlled by your nausea medication, call the clinic. If it is after clinic hours your family physician or the after hours number for the clinic or go to the Emergency Department.   BELOW ARE SYMPTOMS THAT SHOULD BE REPORTED IMMEDIATELY:  *FEVER GREATER THAN 100.5 F  *CHILLS WITH OR WITHOUT FEVER  NAUSEA AND VOMITING THAT IS NOT CONTROLLED WITH YOUR NAUSEA MEDICATION  *UNUSUAL SHORTNESS OF BREATH  *UNUSUAL BRUISING OR BLEEDING  TENDERNESS IN MOUTH AND THROAT WITH OR WITHOUT PRESENCE OF ULCERS  *URINARY PROBLEMS  *BOWEL PROBLEMS  UNUSUAL RASH Items with * indicate a potential emergency and should be followed up as soon as possible.  One of the nurses will contact you 24 hours after your treatment. Please let the nurse know about any problems that you may have experienced. Feel free to call the clinic you have any questions or concerns. The clinic phone number is [phone].   I have been informed and understand all the instructions given to me. I know to contact the clinic, my physician, or go to the Emergency Department if any problems should occur. I do not have any questions at this time, but understand that I may call the clinic during office hours   should I have any questions or need assistance in obtaining follow up care.    __________________________________________  _____________  __________ Signature of Patient or Authorized Representative            Date                   Time    __________________________________________ Nurse's Signature

## 2012-06-30 ENCOUNTER — Ambulatory Visit (HOSPITAL_BASED_OUTPATIENT_CLINIC_OR_DEPARTMENT_OTHER): Payer: 59

## 2012-06-30 DIAGNOSIS — Z452 Encounter for adjustment and management of vascular access device: Secondary | ICD-10-CM

## 2012-06-30 DIAGNOSIS — C20 Malignant neoplasm of rectum: Secondary | ICD-10-CM

## 2012-06-30 MED ORDER — HEPARIN SOD (PORK) LOCK FLUSH 100 UNIT/ML IV SOLN
500.0000 [IU] | Freq: Once | INTRAVENOUS | Status: AC | PRN
  Administered 2012-06-30: 500 [IU]
  Filled 2012-06-30: qty 5

## 2012-06-30 MED ORDER — SODIUM CHLORIDE 0.9 % IJ SOLN
10.0000 mL | INTRAMUSCULAR | Status: DC | PRN
  Administered 2012-06-30: 10 mL
  Filled 2012-06-30: qty 10

## 2012-07-02 ENCOUNTER — Other Ambulatory Visit: Payer: Self-pay | Admitting: Internal Medicine

## 2012-07-10 ENCOUNTER — Other Ambulatory Visit: Payer: Self-pay | Admitting: Oncology

## 2012-07-12 ENCOUNTER — Telehealth: Payer: Self-pay | Admitting: Oncology

## 2012-07-12 ENCOUNTER — Ambulatory Visit (HOSPITAL_BASED_OUTPATIENT_CLINIC_OR_DEPARTMENT_OTHER): Payer: 59 | Admitting: Nurse Practitioner

## 2012-07-12 ENCOUNTER — Other Ambulatory Visit (HOSPITAL_BASED_OUTPATIENT_CLINIC_OR_DEPARTMENT_OTHER): Payer: 59 | Admitting: Lab

## 2012-07-12 ENCOUNTER — Ambulatory Visit (HOSPITAL_BASED_OUTPATIENT_CLINIC_OR_DEPARTMENT_OTHER): Payer: 59

## 2012-07-12 VITALS — BP 158/88 | HR 85 | Temp 97.2°F | Resp 20 | Ht 70.0 in | Wt 168.2 lb

## 2012-07-12 DIAGNOSIS — C20 Malignant neoplasm of rectum: Secondary | ICD-10-CM

## 2012-07-12 DIAGNOSIS — R197 Diarrhea, unspecified: Secondary | ICD-10-CM

## 2012-07-12 DIAGNOSIS — K7689 Other specified diseases of liver: Secondary | ICD-10-CM

## 2012-07-12 DIAGNOSIS — G589 Mononeuropathy, unspecified: Secondary | ICD-10-CM

## 2012-07-12 DIAGNOSIS — E876 Hypokalemia: Secondary | ICD-10-CM

## 2012-07-12 DIAGNOSIS — Z5111 Encounter for antineoplastic chemotherapy: Secondary | ICD-10-CM

## 2012-07-12 LAB — COMPREHENSIVE METABOLIC PANEL
ALT: 8 U/L (ref 0–53)
BUN: 21 mg/dL (ref 6–23)
CO2: 24 mEq/L (ref 19–32)
Calcium: 8.8 mg/dL (ref 8.4–10.5)
Chloride: 110 mEq/L (ref 96–112)
Creatinine, Ser: 1.25 mg/dL (ref 0.50–1.35)

## 2012-07-12 LAB — CBC WITH DIFFERENTIAL/PLATELET
BASO%: 0.6 % (ref 0.0–2.0)
Basophils Absolute: 0 10*3/uL (ref 0.0–0.1)
HCT: 34.1 % — ABNORMAL LOW (ref 38.4–49.9)
HGB: 11.6 g/dL — ABNORMAL LOW (ref 13.0–17.1)
MONO#: 0.4 10*3/uL (ref 0.1–0.9)
NEUT%: 73.1 % (ref 39.0–75.0)
WBC: 3.4 10*3/uL — ABNORMAL LOW (ref 4.0–10.3)
lymph#: 0.5 10*3/uL — ABNORMAL LOW (ref 0.9–3.3)

## 2012-07-12 MED ORDER — DEXTROSE 5 % IV SOLN
Freq: Once | INTRAVENOUS | Status: AC
  Administered 2012-07-12: 13:00:00 via INTRAVENOUS

## 2012-07-12 MED ORDER — FLUOROURACIL CHEMO INJECTION 2.5 GM/50ML
400.0000 mg/m2 | Freq: Once | INTRAVENOUS | Status: AC
  Administered 2012-07-12: 750 mg via INTRAVENOUS
  Filled 2012-07-12: qty 15

## 2012-07-12 MED ORDER — ONDANSETRON 8 MG/50ML IVPB (CHCC)
8.0000 mg | Freq: Once | INTRAVENOUS | Status: AC
  Administered 2012-07-12: 8 mg via INTRAVENOUS

## 2012-07-12 MED ORDER — LEUCOVORIN CALCIUM INJECTION 350 MG
400.0000 mg/m2 | Freq: Once | INTRAVENOUS | Status: AC
  Administered 2012-07-12: 756 mg via INTRAVENOUS
  Filled 2012-07-12: qty 37.8

## 2012-07-12 MED ORDER — DEXAMETHASONE SODIUM PHOSPHATE 10 MG/ML IJ SOLN: 10.0000 mg | Freq: Once | INTRAMUSCULAR | Status: DC

## 2012-07-12 MED ORDER — OXALIPLATIN CHEMO INJECTION 100 MG/20ML
65.0000 mg/m2 | Freq: Once | INTRAVENOUS | Status: AC
  Administered 2012-07-12: 125 mg via INTRAVENOUS
  Filled 2012-07-12: qty 25

## 2012-07-12 MED ORDER — SODIUM CHLORIDE 0.9 % IV SOLN
2400.0000 mg/m2 | INTRAVENOUS | Status: DC
  Administered 2012-07-12: 4550 mg via INTRAVENOUS
  Filled 2012-07-12: qty 91

## 2012-07-12 NOTE — Telephone Encounter (Signed)
Gave pt appt for August  2013 lab, and MD, gave chemo order to [facility]

## 2012-07-12 NOTE — Progress Notes (Signed)
OFFICE PROGRESS NOTE  Interval history:  [name] [name] returns as scheduled. He completed cycle 4 FOLFOX on 06/21/2012. He denies nausea/vomiting. No mouth sores. No diarrhea following the chemotherapy. He did have several loose stools a few days ago. Cold sensitivity lasted 3-4 days. He has intermittent numbness at the left great toe. He mainly notes the numbness when he lays on the left side. He has a good appetite. He is gaining weight.   Objective: Blood pressure 158/88, pulse 85, temperature 97.2 F (36.2 C), temperature source Oral, resp. rate 20, height 5\' 10"  (1.778 m), weight 168 lb 3.2 oz (76.295 kg).  Oropharynx is without thrush or ulceration. Lungs are clear. Regular cardiac rhythm. Port-A-Cath site is without erythema. Abdomen is soft and nontender. No organomegaly. Extremities are without edema. Mild to moderate decrease in vibratory sense at the fingertips bilaterally per tuning fork exam.  Lab Results: Lab Results  Component Value Date   WBC 3.4* 07/12/2012   HGB 11.6* 07/12/2012   HCT 34.1* 07/12/2012   MCV 103.2* 07/12/2012   PLT 142 07/12/2012    Chemistry:    Chemistry      Component Value Date/Time   NA 143 07/12/2012 1021   K 3.2* 07/12/2012 1021   CL 110 07/12/2012 1021   CO2 24 07/12/2012 1021   BUN 21 07/12/2012 1021   CREATININE 1.25 07/12/2012 1021      Component Value Date/Time   CALCIUM 8.8 07/12/2012 1021   ALKPHOS 119* 07/12/2012 1021   AST 12 07/12/2012 1021   ALT 8 07/12/2012 1021   BILITOT 0.4 07/12/2012 1021       Studies/Results: No results found.  Medications: I have reviewed the patient's current medications.  Assessment/Plan:  1. Metastatic rectal cancer status post biopsy of rectal mass 09/03/2011 with pathology showing invasive adenocarcinoma. Staging CT scans 09/03/2011 showed a 10 mm hypodense rounded lesion in left lateral hepatic lobe, similar 6 mm lesion in the superior right hepatic lobe and a smaller subcapsular lesion in the lateral right hepatic lobe;  rectal mass within the lumen of the bowel emanating from the left wall measuring 4.9 x 3.5 cm and a mass within the low left perirectal fat measuring 2.3 x 3.2 cm consistent with local extension of carcinoma. Staging PET scan 09/18/2011 showed a 5.1 cm distal sigmoid/rectal mass with max SUV 19.2 with local extension into the left perirectal fat with max SUV 7.7. At least 2 suspected hepatic metastases with max SUV 6.7 in the lateral segment left hepatic lobe and max SUV 4.1 in the lateral right hepatic dome. Additional tiny hypodensities in the liver may be cysts; patchy/nodular opacity in the left lower lobe with max SUV 3.5 worrisome for pulmonary metastasis, less likely infectious. He began radiation and concurrent Xeloda chemotherapy on 09/29/2011. He completed radiation on 11/06/2011. MRI of the liver on 12/08/2011 showed 2 hypermetabolic liver lesions, similar in size to 09/03/2011. 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 Equivocal 7 mm lesion in the inferior right hepatic lobe. No evidence of extrahepatic metastasis within the abdomen. An ultrasound-guided biopsy of a small left hepatic lesion on 12/23/2011 was nondiagnostic. He underwent a low anterior resection with loop ileostomy on 02/06/2012 with final pathology showing a 3.5 cm invasive adenocarcinoma with abundant extracellular mucin invading through the muscularis propria into pericolonic fatty tissue; 4 of 10 pericolonic lymph nodes were positive for metastatic carcinoma; resection margins were clear (ypT3,ypN2). He began FOLFOX chemotherapy on 03/16/2012. He  completed 2 cycles of FOLFOX chemotherapy and underwent an ileostomy reversal 05/05/2012. FOLFOX was resumed on 06/14/2012. 2. CEA 13.8 on 09/17/2011; 1.6 on 12/01/2011; 8.4 on 03/09/2012. 3. K-ras mutation detected. 4. Rectal pain and bleeding secondary to #1. Improved. 5. Frequent loose stools secondary to #1.  Improved. 6. Hypertension. 7. Tobacco use. 8. CT scan 09/03/2011 with chronic right hydronephrosis secondary to an obstructing calculus in the mid right ureter. He is followed by Dr. Brunilda Payor.  9. Status post cystoscopy, right retrograde pyelogram, ureteroscopy and insertion of right double-J stent 11/28/2011. The double-J stent was replaced 02/06/2012. The stent has been removed. 10. Elevated BUN/creatinine. Improved with hydration. He is followed by Dr. Lowell Guitar. 11. Status post ultrasound-guided fine-needle aspiration of small left hepatic lesion 12/22/2011. The pathology revealed no malignancy. 12. Malaise and weight loss. Resolved. 13. High output ileostomy. Partially improved with Imodium. Status post ileostomy reversal 05/05/2012. 14. Erectile dysfunction. He is followed by Dr. Brunilda Payor. 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  Disposition-[name] appears stable. Plan to proceed with cycle 5 FOLFOX today as scheduled. He will return for a followup visit and cycle 6 in 2 weeks. He will contact the office in the interim with any problems.  Plan reviewed with Dr. Truett Perna.  [name] [name] ANP/GNP-BC

## 2012-07-12 NOTE — Patient Instructions (Signed)
Patient aware of next appointment; discharged home with no complaints. 

## 2012-07-14 ENCOUNTER — Ambulatory Visit (HOSPITAL_BASED_OUTPATIENT_CLINIC_OR_DEPARTMENT_OTHER): Payer: 59

## 2012-07-14 VITALS — BP 149/83 | HR 96 | Temp 98.2°F

## 2012-07-14 DIAGNOSIS — Z452 Encounter for adjustment and management of vascular access device: Secondary | ICD-10-CM

## 2012-07-14 DIAGNOSIS — C20 Malignant neoplasm of rectum: Secondary | ICD-10-CM

## 2012-07-14 MED ORDER — SODIUM CHLORIDE 0.9 % IJ SOLN
10.0000 mL | INTRAMUSCULAR | Status: DC | PRN
  Administered 2012-07-14: 10 mL
  Filled 2012-07-14: qty 10

## 2012-07-14 MED ORDER — HEPARIN SOD (PORK) LOCK FLUSH 100 UNIT/ML IV SOLN
500.0000 [IU] | Freq: Once | INTRAVENOUS | Status: AC | PRN
  Administered 2012-07-14: 500 [IU]
  Filled 2012-07-14: qty 5

## 2012-07-14 NOTE — Patient Instructions (Signed)
Call MD with any problems 

## 2012-07-15 ENCOUNTER — Telehealth: Payer: Self-pay | Admitting: *Deleted

## 2012-07-15 NOTE — Telephone Encounter (Signed)
Message copied by Caleb Popp on Thu Jul 15, 2012 11:48 AM ------      Message from: Thornton Papas B      Created: Mon Jul 12, 2012  9:44 PM       Please call patient, K+ is low , is he taking the kcl?

## 2012-07-15 NOTE — Telephone Encounter (Signed)
Spoke with pt regarding K being low 3.2.  Pt states he "just got some more" and knows to not skip a dose and to take 1 twice a day.  Will re-check in 2 weeks.

## 2012-07-25 ENCOUNTER — Other Ambulatory Visit: Payer: Self-pay | Admitting: Oncology

## 2012-07-26 ENCOUNTER — Telehealth: Payer: Self-pay | Admitting: Oncology

## 2012-07-26 ENCOUNTER — Ambulatory Visit (HOSPITAL_BASED_OUTPATIENT_CLINIC_OR_DEPARTMENT_OTHER): Payer: 59

## 2012-07-26 ENCOUNTER — Other Ambulatory Visit (HOSPITAL_BASED_OUTPATIENT_CLINIC_OR_DEPARTMENT_OTHER): Payer: 59 | Admitting: Lab

## 2012-07-26 ENCOUNTER — Encounter: Payer: Self-pay | Admitting: Oncology

## 2012-07-26 ENCOUNTER — Telehealth: Payer: Self-pay | Admitting: *Deleted

## 2012-07-26 ENCOUNTER — Ambulatory Visit (HOSPITAL_BASED_OUTPATIENT_CLINIC_OR_DEPARTMENT_OTHER): Payer: 59 | Admitting: Oncology

## 2012-07-26 VITALS — BP 145/80 | HR 97 | Temp 97.6°F | Resp 20 | Ht 70.0 in | Wt 167.0 lb

## 2012-07-26 DIAGNOSIS — Z5111 Encounter for antineoplastic chemotherapy: Secondary | ICD-10-CM

## 2012-07-26 DIAGNOSIS — K7689 Other specified diseases of liver: Secondary | ICD-10-CM

## 2012-07-26 DIAGNOSIS — F17200 Nicotine dependence, unspecified, uncomplicated: Secondary | ICD-10-CM

## 2012-07-26 DIAGNOSIS — C20 Malignant neoplasm of rectum: Secondary | ICD-10-CM

## 2012-07-26 DIAGNOSIS — G622 Polyneuropathy due to other toxic agents: Secondary | ICD-10-CM

## 2012-07-26 LAB — COMPREHENSIVE METABOLIC PANEL
ALT: 10 U/L (ref 0–53)
BUN: 24 mg/dL — ABNORMAL HIGH (ref 6–23)
Chloride: 109 mEq/L (ref 96–112)
Potassium: 3.7 mEq/L (ref 3.5–5.3)
Sodium: 141 mEq/L (ref 135–145)

## 2012-07-26 LAB — CBC WITH DIFFERENTIAL/PLATELET
BASO%: 0.2 % (ref 0.0–2.0)
Basophils Absolute: 0 10*3/uL (ref 0.0–0.1)
HCT: 33.8 % — ABNORMAL LOW (ref 38.4–49.9)
HGB: 11.8 g/dL — ABNORMAL LOW (ref 13.0–17.1)
MCHC: 34.9 g/dL (ref 32.0–36.0)
MONO#: 0.5 10*3/uL (ref 0.1–0.9)
NEUT%: 68.9 % (ref 39.0–75.0)
WBC: 4.1 10*3/uL (ref 4.0–10.3)
lymph#: 0.7 10*3/uL — ABNORMAL LOW (ref 0.9–3.3)

## 2012-07-26 LAB — CEA: CEA: 2.5 ng/mL (ref 0.0–5.0)

## 2012-07-26 MED ORDER — FLUOROURACIL CHEMO INJECTION 2.5 GM/50ML
400.0000 mg/m2 | Freq: Once | INTRAVENOUS | Status: AC
  Administered 2012-07-26: 750 mg via INTRAVENOUS
  Filled 2012-07-26: qty 15

## 2012-07-26 MED ORDER — DEXTROSE 5 % IV SOLN
Freq: Once | INTRAVENOUS | Status: AC
  Administered 2012-07-26: 12:00:00 via INTRAVENOUS

## 2012-07-26 MED ORDER — SODIUM CHLORIDE 0.9 % IV SOLN
2400.0000 mg/m2 | INTRAVENOUS | Status: DC
  Administered 2012-07-26: 4550 mg via INTRAVENOUS
  Filled 2012-07-26: qty 91

## 2012-07-26 MED ORDER — DEXAMETHASONE SODIUM PHOSPHATE 10 MG/ML IJ SOLN
10.0000 mg | Freq: Once | INTRAMUSCULAR | Status: AC
  Administered 2012-07-26: 10 mg via INTRAVENOUS

## 2012-07-26 MED ORDER — OXALIPLATIN CHEMO INJECTION 100 MG/20ML
65.0000 mg/m2 | Freq: Once | INTRAVENOUS | Status: AC
  Administered 2012-07-26: 125 mg via INTRAVENOUS
  Filled 2012-07-26: qty 25

## 2012-07-26 MED ORDER — LEUCOVORIN CALCIUM INJECTION 350 MG
400.0000 mg/m2 | Freq: Once | INTRAVENOUS | Status: AC
  Administered 2012-07-26: 756 mg via INTRAVENOUS
  Filled 2012-07-26: qty 37.8

## 2012-07-26 MED ORDER — ONDANSETRON 8 MG/50ML IVPB (CHCC)
8.0000 mg | Freq: Once | INTRAVENOUS | Status: AC
  Administered 2012-07-26: 8 mg via INTRAVENOUS

## 2012-07-26 NOTE — Progress Notes (Signed)
Put disability form on nurse's desk. °

## 2012-07-26 NOTE — Telephone Encounter (Signed)
Left message on voicemail for pt to call office for lab results. (Potassium normal, CEA normal)

## 2012-07-26 NOTE — Telephone Encounter (Signed)
Gave pt appt for September 2013 lab, ML and chemo °

## 2012-07-26 NOTE — Progress Notes (Signed)
[facility]    OFFICE PROGRESS NOTE   INTERVAL HISTORY:   He returns as  scheduled. He completed another cycle of FOLFOX on 07/12/2012. He denies nausea and mouth sores following chemotherapy. Cold sensitivity lasting several days. He reports diarrhea for the past 4-5 days. This improved when he began Imodium. He does not feel dehydrated. Intermittent tingling in the left toes. No other neuropathy symptoms.  Objective:  Vital signs in last 24 hours:  Blood pressure 145/80, pulse 97, temperature 97.6 F (36.4 C), temperature source Oral, resp. rate 20, height 5\' 10"  (1.778 m), weight 167 lb (75.751 kg).    HEENT: No thrush or ulcers Resp: Lungs clear bilaterally Cardio: Regular rate and rhythm GI: No hepatomegaly, nontender Vascular: No leg edema Neuro: Mild decrease in vibratory sense at the fingertips    Portacath/PICC-without erythema  Lab Results:  Lab Results  Component Value Date   WBC 4.1 07/26/2012   HGB 11.8* 07/26/2012   HCT 33.8* 07/26/2012   MCV 98.3* 07/26/2012   PLT 139* 07/26/2012   ANC 2.9    Medications: I have reviewed the patient's current medications.  Assessment/Plan: 1. Metastatic rectal cancer status post biopsy of rectal mass 09/03/2011 with pathology showing invasive adenocarcinoma. Staging CT scans 09/03/2011 showed a 10 mm hypodense rounded lesion in left lateral hepatic lobe, similar 6 mm lesion in the superior right hepatic lobe and a smaller subcapsular lesion in the lateral right hepatic lobe; rectal mass within the lumen of the bowel emanating from the left wall measuring 4.9 x 3.5 cm and a mass within the low left perirectal fat measuring 2.3 x 3.2 cm consistent with local extension of carcinoma. Staging PET scan 09/18/2011 showed a 5.1 cm distal sigmoid/rectal mass with max SUV 19.2 with local extension into the left perirectal fat with max SUV 7.7. At least 2 suspected hepatic metastases with max SUV 6.7 in the lateral segment  left hepatic lobe and max SUV 4.1 in the lateral right hepatic dome. Additional tiny hypodensities in the liver may be cysts; patchy/nodular opacity in the left lower lobe with max SUV 3.5 worrisome for pulmonary metastasis, less likely infectious. He began radiation and concurrent Xeloda chemotherapy on 09/29/2011. He completed radiation on 11/06/2011. MRI of the liver on 12/08/2011 showed 2 hypermetabolic liver lesions, similar in size to 09/03/2011. 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 Equivocal 7 mm lesion in the inferior right hepatic lobe. No evidence of extrahepatic metastasis within the abdomen. An ultrasound-guided biopsy of a small left hepatic lesion on 12/23/2011 was nondiagnostic. He underwent a low anterior resection with loop ileostomy on 02/06/2012 with final pathology showing a 3.5 cm invasive adenocarcinoma with abundant extracellular mucin invading through the muscularis propria into pericolonic fatty tissue; 4 of 10 pericolonic lymph nodes were positive for metastatic carcinoma; resection margins were clear (ypT3,ypN2). He began FOLFOX chemotherapy on 03/16/2012. He completed 2 cycles of FOLFOX chemotherapy and underwent an ileostomy reversal 05/05/2012. FOLFOX was resumed on 06/14/2012. 2. CEA 13.8 on 09/17/2011; 1.6 on 12/01/2011; 8.4 on 03/09/2012, 2.1 on may 27th 2013. 3. K-ras mutation detected. 4. Rectal pain and bleeding secondary to #1. Improved. 5. Frequent loose stools secondary to #1. Improved. 6. Hypertension. 7. Tobacco use. 8. CT scan 09/03/2011 with chronic right hydronephrosis secondary to an obstructing calculus in the mid right ureter. He is followed by Dr. Brunilda Payor.  9. Status post cystoscopy, right retrograde pyelogram, ureteroscopy and insertion of right double-J  stent 11/28/2011. The double-J stent was replaced 02/06/2012. The stent has been removed. 10. Elevated BUN/creatinine. Likely secondary to  dehydration while the ileostomy was in place. Improved. He is followed by Dr. Lowell Guitar. 11. Status post ultrasound-guided fine-needle aspiration of small left hepatic lesion 12/22/2011. The pathology revealed no malignancy. 12. Malaise and weight loss. Resolved. 13. High output ileostomy. Partially improved with Imodium. Status post ileostomy reversal 05/05/2012. 14. Erectile dysfunction. He is followed by Dr. Brunilda Payor. 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  Disposition:  Mr. Weisel appears stable. The plan is to proceed with cycle 6 of FOLFOX today. He will return for an office visit and chemotherapy in 2 weeks. He knows to contact us if the diarrhea increases.   Thornton Papas, MD  07/26/2012  11:07 AM

## 2012-07-26 NOTE — Patient Instructions (Addendum)
[facility] Discharge Instructions for Patients Receiving Chemotherapy  Today you received the following chemotherapy agents 5FU, Oxaliplatin, Leucovorin.  To help prevent nausea and vomiting after your treatment, we encourage you to take your nausea medication.   If you develop nausea and vomiting that is not controlled by your nausea medication, call the clinic.   BELOW ARE SYMPTOMS THAT SHOULD BE REPORTED IMMEDIATELY:  *FEVER GREATER THAN 100.5 F  *CHILLS WITH OR WITHOUT FEVER  NAUSEA AND VOMITING THAT IS NOT CONTROLLED WITH YOUR NAUSEA MEDICATION  *UNUSUAL SHORTNESS OF BREATH  *UNUSUAL BRUISING OR BLEEDING  TENDERNESS IN MOUTH AND THROAT WITH OR WITHOUT PRESENCE OF ULCERS  *URINARY PROBLEMS  *BOWEL PROBLEMS  UNUSUAL RASH Items with * indicate a potential emergency and should be followed up as soon as possible.  Feel free to call the clinic you have any questions or concerns. The clinic phone number is [phone].    

## 2012-07-28 ENCOUNTER — Ambulatory Visit (HOSPITAL_BASED_OUTPATIENT_CLINIC_OR_DEPARTMENT_OTHER): Payer: 59

## 2012-07-28 ENCOUNTER — Encounter: Payer: Self-pay | Admitting: Oncology

## 2012-07-28 VITALS — BP 115/71 | HR 99 | Temp 98.7°F

## 2012-07-28 DIAGNOSIS — Z452 Encounter for adjustment and management of vascular access device: Secondary | ICD-10-CM

## 2012-07-28 DIAGNOSIS — C20 Malignant neoplasm of rectum: Secondary | ICD-10-CM

## 2012-07-28 MED ORDER — HEPARIN SOD (PORK) LOCK FLUSH 100 UNIT/ML IV SOLN
500.0000 [IU] | Freq: Once | INTRAVENOUS | Status: AC | PRN
  Administered 2012-07-28: 500 [IU]
  Filled 2012-07-28: qty 5

## 2012-07-28 MED ORDER — SODIUM CHLORIDE 0.9 % IJ SOLN
10.0000 mL | INTRAMUSCULAR | Status: DC | PRN
  Administered 2012-07-28: 10 mL
  Filled 2012-07-28: qty 10

## 2012-07-28 NOTE — Progress Notes (Unsigned)
Put disability form in registration desk. °

## 2012-08-09 ENCOUNTER — Other Ambulatory Visit: Payer: Self-pay | Admitting: Oncology

## 2012-08-10 ENCOUNTER — Telehealth: Payer: Self-pay | Admitting: Oncology

## 2012-08-10 ENCOUNTER — Ambulatory Visit (HOSPITAL_BASED_OUTPATIENT_CLINIC_OR_DEPARTMENT_OTHER): Payer: 59 | Admitting: Nurse Practitioner

## 2012-08-10 ENCOUNTER — Other Ambulatory Visit (HOSPITAL_BASED_OUTPATIENT_CLINIC_OR_DEPARTMENT_OTHER): Payer: 59 | Admitting: Lab

## 2012-08-10 ENCOUNTER — Inpatient Hospital Stay: Payer: 59

## 2012-08-10 VITALS — BP 127/80 | HR 88 | Temp 97.7°F | Resp 20 | Ht 70.0 in | Wt 167.3 lb

## 2012-08-10 DIAGNOSIS — C20 Malignant neoplasm of rectum: Secondary | ICD-10-CM

## 2012-08-10 DIAGNOSIS — F17200 Nicotine dependence, unspecified, uncomplicated: Secondary | ICD-10-CM

## 2012-08-10 DIAGNOSIS — R197 Diarrhea, unspecified: Secondary | ICD-10-CM

## 2012-08-10 LAB — CBC WITH DIFFERENTIAL/PLATELET
BASO%: 0.9 % (ref 0.0–2.0)
EOS%: 0.8 % (ref 0.0–7.0)
MCH: 37.1 pg — ABNORMAL HIGH (ref 27.2–33.4)
MCHC: 35.3 g/dL (ref 32.0–36.0)
MCV: 105.1 fL — ABNORMAL HIGH (ref 79.3–98.0)
MONO%: 13.4 % (ref 0.0–14.0)
RBC: 3.22 10*6/uL — ABNORMAL LOW (ref 4.20–5.82)
RDW: 17.5 % — ABNORMAL HIGH (ref 11.0–14.6)
lymph#: 0.6 10*3/uL — ABNORMAL LOW (ref 0.9–3.3)

## 2012-08-10 LAB — COMPREHENSIVE METABOLIC PANEL (CC13)
AST: 15 U/L (ref 5–34)
Albumin: 3.7 g/dL (ref 3.5–5.0)
Alkaline Phosphatase: 98 U/L (ref 40–150)
Potassium: 3.2 mEq/L — ABNORMAL LOW (ref 3.5–5.1)
Sodium: 141 mEq/L (ref 136–145)
Total Bilirubin: 0.9 mg/dL (ref 0.20–1.20)
Total Protein: 6.6 g/dL (ref 6.4–8.3)

## 2012-08-10 MED ORDER — DIPHENOXYLATE-ATROPINE 2.5-0.025 MG PO TABS: 2.0000 | ORAL_TABLET | Freq: Four times a day (QID) | ORAL | Status: AC | PRN

## 2012-08-10 NOTE — Telephone Encounter (Signed)
appts made and printed for pt aom °

## 2012-08-10 NOTE — Progress Notes (Signed)
OFFICE PROGRESS NOTE  Interval history:  [name] [name] returns as scheduled. He completed cycle 6 FOLFOX on 07/26/2012. He had nausea following treatment. No vomiting. He had diarrhea following treatment which has persisted. He estimates 8-10 stools per day. He is taking Imodium with some improvement. He denies mouth sores. Cold sensitivity lasted approximately one week. He denies persistent neuropathy symptoms. He is pushing fluids.   Objective: Blood pressure 127/80, pulse 88, temperature 97.7 F (36.5 C), temperature source Oral, resp. rate 20, height 5\' 10"  (1.778 m), weight 167 lb 4.8 oz (75.887 kg).  Oropharynx is without thrush or ulceration. Mucous membranes are moist. Lungs are clear. Regular cardiac rhythm. Port-A-Cath site is without erythema. Abdomen is soft and nontender. No hepatomegaly. Bowel sounds active. Extremities without edema.  Lab Results: Lab Results  Component Value Date   WBC 3.5* 08/10/2012   HGB 11.9* 08/10/2012   HCT 33.8* 08/10/2012   MCV 105.1* 08/10/2012   PLT 131* 08/10/2012    Chemistry:    Chemistry      Component Value Date/Time   NA 141 07/26/2012 1008   K 3.7 07/26/2012 1008   CL 109 07/26/2012 1008   CO2 22 07/26/2012 1008   BUN 24* 07/26/2012 1008   CREATININE 1.44* 07/26/2012 1008      Component Value Date/Time   CALCIUM 8.7 07/26/2012 1008   ALKPHOS 99 07/26/2012 1008   AST 13 07/26/2012 1008   ALT 10 07/26/2012 1008   BILITOT 0.5 07/26/2012 1008       Studies/Results: No results found.  Medications: I have reviewed the patient's current medications.  Assessment/Plan:  1. Metastatic rectal cancer status post biopsy of rectal mass 09/03/2011 with pathology showing invasive adenocarcinoma. Staging CT scans 09/03/2011 showed a 10 mm hypodense rounded lesion in left lateral hepatic lobe, similar 6 mm lesion in the superior right hepatic lobe and a smaller subcapsular lesion in the lateral right hepatic lobe; rectal mass within the lumen of the bowel  emanating from the left wall measuring 4.9 x 3.5 cm and a mass within the low left perirectal fat measuring 2.3 x 3.2 cm consistent with local extension of carcinoma. Staging PET scan 09/18/2011 showed a 5.1 cm distal sigmoid/rectal mass with max SUV 19.2 with local extension into the left perirectal fat with max SUV 7.7. At least 2 suspected hepatic metastases with max SUV 6.7 in the lateral segment left hepatic lobe and max SUV 4.1 in the lateral right hepatic dome. Additional tiny hypodensities in the liver may be cysts; patchy/nodular opacity in the left lower lobe with max SUV 3.5 worrisome for pulmonary metastasis, less likely infectious. He began radiation and concurrent Xeloda chemotherapy on 09/29/2011. He completed radiation on 11/06/2011. MRI of the liver on 12/08/2011 showed 2 hypermetabolic liver lesions, similar in size to 09/03/2011. 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 Equivocal 7 mm lesion in the inferior right hepatic lobe. No evidence of extrahepatic metastasis within the abdomen. An ultrasound-guided biopsy of a small left hepatic lesion on 12/23/2011 was nondiagnostic. He underwent a low anterior resection with loop ileostomy on 02/06/2012 with final pathology showing a 3.5 cm invasive adenocarcinoma with abundant extracellular mucin invading through the muscularis propria into pericolonic fatty tissue; 4 of 10 pericolonic lymph nodes were positive for metastatic carcinoma; resection margins were clear (ypT3,ypN2). He began FOLFOX chemotherapy on 03/16/2012. He completed 2 cycles of FOLFOX chemotherapy and underwent an ileostomy reversal 05/05/2012. FOLFOX was resumed on 06/14/2012. 2.  CEA 13.8 on 09/17/2011; 1.6 on 12/01/2011; 8.4 on 03/09/2012, 2.1 on 05/03/2012; 2.5 on 07/26/2012. 3. K-ras mutation detected. 4. Rectal pain and bleeding secondary to #1. Improved. 5. Frequent loose stools secondary to #1.  Improved. 6. Hypertension. 7. Tobacco use. 8. CT scan 09/03/2011 with chronic right hydronephrosis secondary to an obstructing calculus in the mid right ureter. He is followed by Dr. Brunilda Payor.  9. Status post cystoscopy, right retrograde pyelogram, ureteroscopy and insertion of right double-J stent 11/28/2011. The double-J stent was replaced 02/06/2012. The stent has been removed. 10. Elevated BUN/creatinine. Likely secondary to dehydration while the ileostomy was in place. Improved. He is followed by Dr. Lowell Guitar. 11. Status post ultrasound-guided fine-needle aspiration of small left hepatic lesion 12/22/2011. The pathology revealed no malignancy. 12. Malaise and weight loss. Resolved. 13. High output ileostomy. Partially improved with Imodium. Status post ileostomy reversal 05/05/2012. 14. Erectile dysfunction. He is followed by Dr. Brunilda Payor. 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 16. Diarrhea. Persistent.  Disposition-[name] [name] has persistent/increased diarrhea. We will hold today's treatment and reschedule for one week. He will try Lomotil 2 tablets every 4 hours as needed and contact the office if this is not effective. We will see him in followup in one week.  Reviewed with Dr. Truett Perna.    Lonna Cobb ANP/GNP-BC

## 2012-08-13 ENCOUNTER — Telehealth: Payer: Self-pay | Admitting: *Deleted

## 2012-08-13 NOTE — Telephone Encounter (Signed)
Pt stated he is taking his K tabs daily, his diarrhea has resolved, and verbalized understanding regarding increased fluids intake.  SLJ

## 2012-08-13 NOTE — Telephone Encounter (Signed)
Message copied by Caren Griffins on Fri Aug 13, 2012  3:49 PM ------      Message from: Mead, [place] P      Created: Fri Aug 13, 2012  3:37 PM                   ----- Message -----         From: Ladene Artist, MD         Sent: 08/12/2012   9:13 PM           To: Wandalee Ferdinand, RN, Glori Luis, RN, #            Please call patient, be sure he is taking potassium, push fluids, call if diarrhea is not better

## 2012-08-14 ENCOUNTER — Other Ambulatory Visit: Payer: Self-pay | Admitting: Oncology

## 2012-08-17 ENCOUNTER — Telehealth: Payer: Self-pay | Admitting: Oncology

## 2012-08-17 ENCOUNTER — Ambulatory Visit (HOSPITAL_BASED_OUTPATIENT_CLINIC_OR_DEPARTMENT_OTHER): Payer: 59 | Admitting: Oncology

## 2012-08-17 ENCOUNTER — Other Ambulatory Visit: Payer: 59 | Admitting: Lab

## 2012-08-17 ENCOUNTER — Ambulatory Visit (HOSPITAL_BASED_OUTPATIENT_CLINIC_OR_DEPARTMENT_OTHER): Payer: 59

## 2012-08-17 VITALS — BP 147/79 | HR 92 | Temp 98.7°F | Resp 20 | Ht 70.0 in | Wt 169.6 lb

## 2012-08-17 DIAGNOSIS — C787 Secondary malignant neoplasm of liver and intrahepatic bile duct: Secondary | ICD-10-CM

## 2012-08-17 DIAGNOSIS — C20 Malignant neoplasm of rectum: Secondary | ICD-10-CM

## 2012-08-17 DIAGNOSIS — C7800 Secondary malignant neoplasm of unspecified lung: Secondary | ICD-10-CM

## 2012-08-17 DIAGNOSIS — Z5111 Encounter for antineoplastic chemotherapy: Secondary | ICD-10-CM

## 2012-08-17 DIAGNOSIS — G620 Drug-induced polyneuropathy: Secondary | ICD-10-CM

## 2012-08-17 LAB — CBC WITH DIFFERENTIAL/PLATELET
BASO%: 0.2 % (ref 0.0–2.0)
Basophils Absolute: 0 10*3/uL (ref 0.0–0.1)
EOS%: 0.9 % (ref 0.0–7.0)
HGB: 12 g/dL — ABNORMAL LOW (ref 13.0–17.1)
MCH: 36.4 pg — ABNORMAL HIGH (ref 27.2–33.4)
MCHC: 35 g/dL (ref 32.0–36.0)
MCV: 103.9 fL — ABNORMAL HIGH (ref 79.3–98.0)
MONO%: 15.3 % — ABNORMAL HIGH (ref 0.0–14.0)
NEUT%: 69.8 % (ref 39.0–75.0)
RDW: 16.4 % — ABNORMAL HIGH (ref 11.0–14.6)

## 2012-08-17 LAB — COMPREHENSIVE METABOLIC PANEL (CC13)
Alkaline Phosphatase: 105 U/L (ref 40–150)
BUN: 31 mg/dL — ABNORMAL HIGH (ref 7.0–26.0)
Glucose: 103 mg/dl — ABNORMAL HIGH (ref 70–99)
Total Bilirubin: 0.8 mg/dL (ref 0.20–1.20)

## 2012-08-17 MED ORDER — FLUOROURACIL CHEMO INJECTION 5 GM/100ML
1920.0000 mg/m2 | INTRAVENOUS | Status: DC
  Administered 2012-08-17: 3650 mg via INTRAVENOUS
  Filled 2012-08-17: qty 73

## 2012-08-17 MED ORDER — FLUOROURACIL CHEMO INJECTION 2.5 GM/50ML
320.0000 mg/m2 | Freq: Once | INTRAVENOUS | Status: AC
  Administered 2012-08-17: 600 mg via INTRAVENOUS
  Filled 2012-08-17: qty 12

## 2012-08-17 MED ORDER — HEPARIN SOD (PORK) LOCK FLUSH 100 UNIT/ML IV SOLN
500.0000 [IU] | Freq: Once | INTRAVENOUS | Status: DC | PRN
  Filled 2012-08-17: qty 5

## 2012-08-17 MED ORDER — LEUCOVORIN CALCIUM INJECTION 350 MG
320.0000 mg/m2 | Freq: Once | INTRAVENOUS | Status: AC
  Administered 2012-08-17: 604 mg via INTRAVENOUS
  Filled 2012-08-17: qty 30.2

## 2012-08-17 MED ORDER — OXALIPLATIN CHEMO INJECTION 100 MG/20ML
65.0000 mg/m2 | Freq: Once | INTRAVENOUS | Status: AC
  Administered 2012-08-17: 125 mg via INTRAVENOUS
  Filled 2012-08-17: qty 25

## 2012-08-17 MED ORDER — SODIUM CHLORIDE 0.9 % IJ SOLN
10.0000 mL | INTRAMUSCULAR | Status: DC | PRN
  Filled 2012-08-17: qty 10

## 2012-08-17 MED ORDER — ONDANSETRON 8 MG/50ML IVPB (CHCC)
8.0000 mg | Freq: Once | INTRAVENOUS | Status: AC
  Administered 2012-08-17: 8 mg via INTRAVENOUS

## 2012-08-17 MED ORDER — DEXAMETHASONE SODIUM PHOSPHATE 10 MG/ML IJ SOLN
10.0000 mg | Freq: Once | INTRAMUSCULAR | Status: AC
  Administered 2012-08-17: 10 mg via INTRAVENOUS

## 2012-08-17 MED ORDER — DEXTROSE 5 % IV SOLN
Freq: Once | INTRAVENOUS | Status: AC
  Administered 2012-08-17: 10:00:00 via INTRAVENOUS

## 2012-08-17 NOTE — Patient Instructions (Signed)
[facility] Discharge Instructions for Patients Receiving Chemotherapy  Today you received the following chemotherapy agents: Oxaliplatin, Leucovorin, 5FU, 5FU pump.  To help prevent nausea and vomiting after your treatment, we encourage you to take your nausea medication.  Take it as often as prescribed.     If you develop nausea and vomiting that is not controlled by your nausea medication, call the clinic. If it is after clinic hours your family physician or the after hours number for the clinic or go to the Emergency Department.   BELOW ARE SYMPTOMS THAT SHOULD BE REPORTED IMMEDIATELY:  *FEVER GREATER THAN 100.5 F  *CHILLS WITH OR WITHOUT FEVER  NAUSEA AND VOMITING THAT IS NOT CONTROLLED WITH YOUR NAUSEA MEDICATION  *UNUSUAL SHORTNESS OF BREATH  *UNUSUAL BRUISING OR BLEEDING  TENDERNESS IN MOUTH AND THROAT WITH OR WITHOUT PRESENCE OF ULCERS  *URINARY PROBLEMS  *BOWEL PROBLEMS  UNUSUAL RASH Items with * indicate a potential emergency and should be followed up as soon as possible.  Feel free to call the clinic you have any questions or concerns. The clinic phone number is [phone].   I have been informed and understand all the instructions given to me. I know to contact the clinic, my physician, or go to the Emergency Department if any problems should occur. I do not have any questions at this time, but understand that I may call the clinic during office hours   should I have any questions or need assistance in obtaining follow up care.    __________________________________________  _____________  __________ Signature of Patient or Authorized Representative            Date                   Time    __________________________________________ Nurse's Signature

## 2012-08-17 NOTE — Patient Instructions (Signed)
[name] [name] [birth year] [id number]  [facility] Discharge Instructions  Your exam findings, labs and results were discussed with your MD today.  Filed Vitals:   08/17/12 0841  BP: 147/79  Pulse: 92  Temp: 98.7 F (37.1 C)  Resp: 20   Current outpatient prescriptions:amLODipine (NORVASC) 5 MG tablet, TAKE 1 TABLET DAILY, Disp: 90 tablet, Rfl: 1;  diphenoxylate-atropine (LOMOTIL) 2.5-0.025 MG per tablet, Take 2 tablets by mouth 4 (four) times daily as needed for diarrhea or loose stools., Disp: 30 tablet, Rfl: 2;  lidocaine-prilocaine (EMLA) cream, Apply topically as needed. Apply to [facility] site 1 hour prior to stick and cover with plastic wrap, Disp: , Rfl:  Naproxen Sodium (ALEVE PO), Take by mouth continuous as needed., Disp: , Rfl: ;  Potassium Chloride (K-TABS PO), Take 20 mEq by mouth 2 (two) times daily., Disp: , Rfl:   Please visit scheduling to obtain calendar for future appointments.  Please call the [facility] at [phone] during business hours should you have any further questions or need assistance in obtaining follow-up care. If you have a medical emergency, please dial 911.  Special Instructions:

## 2012-08-17 NOTE — Telephone Encounter (Signed)
appts made and printed for pt aom °

## 2012-08-17 NOTE — Progress Notes (Signed)
[facility]    OFFICE PROGRESS NOTE   INTERVAL HISTORY:   [name] [name] returns as scheduled. The diarrhea has resolved. [name] [name] reports mild numbness in the fingertips, this does not interfere with activity. [name] [name] has a cold with nasal congestion and increased tearing. [name] [name] reports similar symptoms prior to chemotherapy.  Objective:  Vital signs in last 24 hours:  Blood pressure 147/79, pulse 92, temperature 98.7 F (37.1 C), resp. rate 20, height 5\' 10"  (1.778 m), weight 169 lb 9.6 oz (76.93 kg).    HEENT: No thrush or ulcers Resp: Lungs clear bilaterally Cardio: Regular rate and rhythm with occasional premature beats GI: No hepatosplenomegaly, nontender Vascular: No leg edema Neuro: Mild decrease in vibratory sense at the fingertips bilaterally    Portacath/PICC-without erythema  Lab Results:  Lab Results  Component Value Date   WBC 4.6 08/17/2012   HGB 12.0* 08/17/2012   HCT 34.3* 08/17/2012   MCV 103.9* 08/17/2012   PLT 162 08/17/2012   ANC 3.2    Medications: I have reviewed the patient's current medications.  Assessment/Plan: 1. Metastatic rectal cancer status post biopsy of rectal mass 09/03/2011 with pathology showing invasive adenocarcinoma. Staging CT scans 09/03/2011 showed a 10 mm hypodense rounded lesion in left lateral hepatic lobe, similar 6 mm lesion in the superior right hepatic lobe and a smaller subcapsular lesion in the lateral right hepatic lobe; rectal mass within the lumen of the bowel emanating from the left wall measuring 4.9 x 3.5 cm and a mass within the low left perirectal fat measuring 2.3 x 3.2 cm consistent with local extension of carcinoma. Staging PET scan 09/18/2011 showed a 5.1 cm distal sigmoid/rectal mass with max SUV 19.2 with local extension into the left perirectal fat with max SUV 7.7. At least 2 suspected hepatic metastases with max SUV 6.7 in the lateral segment left hepatic lobe and max SUV 4.1 in the lateral right hepatic dome.  Additional tiny hypodensities in the liver may be cysts; patchy/nodular opacity in the left lower lobe with max SUV 3.5 worrisome for pulmonary metastasis, less likely infectious. [name] [name] began radiation and concurrent Xeloda chemotherapy on 09/29/2011. [name] [name] completed radiation on 11/06/2011. MRI of the liver on 12/08/2011 showed 2 hypermetabolic liver lesions, similar in size to 09/03/2011. 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 Equivocal 7 mm lesion in the inferior right hepatic lobe. No evidence of extrahepatic metastasis within the abdomen. An ultrasound-guided biopsy of a small left hepatic lesion on 12/23/2011 was nondiagnostic. [name] [name] underwent a low anterior resection with loop ileostomy on 02/06/2012 with final pathology showing a 3.5 cm invasive adenocarcinoma with abundant extracellular mucin invading through the muscularis propria into pericolonic fatty tissue; 4 of 10 pericolonic lymph nodes were positive for metastatic carcinoma; resection margins were clear (ypT3,ypN2). [name] [name] began FOLFOX chemotherapy on 03/16/2012. [name] [name] completed 2 cycles of FOLFOX chemotherapy and underwent an ileostomy reversal 05/05/2012. FOLFOX was resumed on 06/14/2012. 2. CEA 13.8 on 09/17/2011; 1.6 on 12/01/2011; 8.4 on 03/09/2012, 2.1 on 05/03/2012; 2.5 on 07/26/2012. 3. K-ras mutation detected. 4. Rectal pain and bleeding secondary to #1. Improved. 5. Frequent loose stools secondary to #1. Improved. 6. Hypertension. 7. Tobacco use. 8. CT scan 09/03/2011 with chronic right hydronephrosis secondary to an obstructing calculus in the mid right ureter. [name] [name] is followed by Dr. Brunilda Payor.  9. Status post cystoscopy, right retrograde pyelogram, ureteroscopy and insertion of right double-J stent 11/28/2011. The double-J stent was replaced 02/06/2012. The stent has been  removed. 10. Elevated BUN/creatinine. Likely secondary to dehydration while the ileostomy was in place. Improved. [name] [name]  is followed by Dr. Lowell Guitar. 11. Status post ultrasound-guided fine-needle aspiration of small left hepatic lesion 12/22/2011. The pathology revealed no malignancy. 12. Malaise and weight loss. Resolved. 13. High output ileostomy. Partially improved with Imodium. Status post ileostomy reversal 05/05/2012. 14. Erectile dysfunction. [name] [name] is followed by Dr. Brunilda Payor. 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 16. Diarrhea after FOLFOX chemotherapy given on 07/26/2012-resolved. The bolus and infusional 5-FU will be dose reduced by 20% beginning with FOLFOX on 08/17/2012.   Disposition:  The plan is to proceed with another cycle of FOLFOX today. The 5-fluorouracil will be dose reduced by 20% secondary to diarrhea. [name] [name] will continue Lomotil as needed for diarrhea. [name] [name] will return for an office visit and chemotherapy in 2 weeks.   Thornton Papas, MD  08/17/2012  9:04 AM

## 2012-08-19 ENCOUNTER — Ambulatory Visit (HOSPITAL_BASED_OUTPATIENT_CLINIC_OR_DEPARTMENT_OTHER): Payer: 59

## 2012-08-19 VITALS — BP 143/80 | HR 91 | Temp 98.0°F | Resp 17

## 2012-08-19 DIAGNOSIS — C787 Secondary malignant neoplasm of liver and intrahepatic bile duct: Secondary | ICD-10-CM

## 2012-08-19 DIAGNOSIS — Z452 Encounter for adjustment and management of vascular access device: Secondary | ICD-10-CM

## 2012-08-19 DIAGNOSIS — C20 Malignant neoplasm of rectum: Secondary | ICD-10-CM

## 2012-08-19 MED ORDER — SODIUM CHLORIDE 0.9 % IJ SOLN
10.0000 mL | INTRAMUSCULAR | Status: DC | PRN
  Administered 2012-08-19: 10 mL
  Filled 2012-08-19: qty 10

## 2012-08-19 MED ORDER — HEPARIN SOD (PORK) LOCK FLUSH 100 UNIT/ML IV SOLN
500.0000 [IU] | Freq: Once | INTRAVENOUS | Status: AC | PRN
  Administered 2012-08-19: 500 [IU]
  Filled 2012-08-19: qty 5

## 2012-08-29 ENCOUNTER — Other Ambulatory Visit: Payer: Self-pay | Admitting: Oncology

## 2012-08-31 ENCOUNTER — Telehealth: Payer: Self-pay | Admitting: Oncology

## 2012-08-31 ENCOUNTER — Encounter: Payer: Self-pay | Admitting: *Deleted

## 2012-08-31 ENCOUNTER — Ambulatory Visit (HOSPITAL_BASED_OUTPATIENT_CLINIC_OR_DEPARTMENT_OTHER): Payer: 59 | Admitting: Oncology

## 2012-08-31 ENCOUNTER — Other Ambulatory Visit (HOSPITAL_BASED_OUTPATIENT_CLINIC_OR_DEPARTMENT_OTHER): Payer: 59

## 2012-08-31 ENCOUNTER — Ambulatory Visit (HOSPITAL_BASED_OUTPATIENT_CLINIC_OR_DEPARTMENT_OTHER): Payer: 59

## 2012-08-31 VITALS — BP 151/85 | HR 88 | Temp 98.1°F | Resp 20 | Ht 70.0 in | Wt 171.2 lb

## 2012-08-31 DIAGNOSIS — Z5111 Encounter for antineoplastic chemotherapy: Secondary | ICD-10-CM

## 2012-08-31 DIAGNOSIS — C20 Malignant neoplasm of rectum: Secondary | ICD-10-CM

## 2012-08-31 DIAGNOSIS — C7800 Secondary malignant neoplasm of unspecified lung: Secondary | ICD-10-CM

## 2012-08-31 DIAGNOSIS — C787 Secondary malignant neoplasm of liver and intrahepatic bile duct: Secondary | ICD-10-CM

## 2012-08-31 LAB — COMPREHENSIVE METABOLIC PANEL (CC13)
Albumin: 3.2 g/dL — ABNORMAL LOW (ref 3.5–5.0)
Alkaline Phosphatase: 90 U/L (ref 40–150)
BUN: 24 mg/dL (ref 7.0–26.0)
Glucose: 101 mg/dl — ABNORMAL HIGH (ref 70–99)
Total Bilirubin: 0.7 mg/dL (ref 0.20–1.20)

## 2012-08-31 LAB — CBC WITH DIFFERENTIAL/PLATELET
Basophils Absolute: 0 10*3/uL (ref 0.0–0.1)
EOS%: 0.7 % (ref 0.0–7.0)
HCT: 32.1 % — ABNORMAL LOW (ref 38.4–49.9)
HGB: 11.1 g/dL — ABNORMAL LOW (ref 13.0–17.1)
MCH: 35.6 pg — ABNORMAL HIGH (ref 27.2–33.4)
MCV: 102.9 fL — ABNORMAL HIGH (ref 79.3–98.0)
MONO%: 12.4 % (ref 0.0–14.0)
NEUT%: 74.8 % (ref 39.0–75.0)

## 2012-08-31 MED ORDER — DEXTROSE 5 % IV SOLN
Freq: Once | INTRAVENOUS | Status: AC
  Administered 2012-08-31: 12:00:00 via INTRAVENOUS

## 2012-08-31 MED ORDER — ONDANSETRON 8 MG/50ML IVPB (CHCC)
8.0000 mg | Freq: Once | INTRAVENOUS | Status: AC
  Administered 2012-08-31: 8 mg via INTRAVENOUS

## 2012-08-31 MED ORDER — OXALIPLATIN CHEMO INJECTION 100 MG/20ML
65.0000 mg/m2 | Freq: Once | INTRAVENOUS | Status: AC
  Administered 2012-08-31: 125 mg via INTRAVENOUS
  Filled 2012-08-31: qty 25

## 2012-08-31 MED ORDER — DEXAMETHASONE SODIUM PHOSPHATE 10 MG/ML IJ SOLN
10.0000 mg | Freq: Once | INTRAMUSCULAR | Status: AC
  Administered 2012-08-31: 10 mg via INTRAVENOUS

## 2012-08-31 MED ORDER — SODIUM CHLORIDE 0.9 % IV SOLN
1920.0000 mg/m2 | INTRAVENOUS | Status: DC
  Administered 2012-08-31: 3650 mg via INTRAVENOUS
  Filled 2012-08-31: qty 73

## 2012-08-31 MED ORDER — LEUCOVORIN CALCIUM INJECTION 350 MG
600.0000 mg | Freq: Once | INTRAVENOUS | Status: AC
  Administered 2012-08-31: 600 mg via INTRAVENOUS
  Filled 2012-08-31: qty 30

## 2012-08-31 MED ORDER — FLUOROURACIL CHEMO INJECTION 2.5 GM/50ML
320.0000 mg/m2 | Freq: Once | INTRAVENOUS | Status: AC
  Administered 2012-08-31: 600 mg via INTRAVENOUS
  Filled 2012-08-31: qty 12

## 2012-08-31 NOTE — Progress Notes (Signed)
[facility]    OFFICE PROGRESS NOTE   INTERVAL HISTORY:   He returns as scheduled. He completed another cycle of FOLFOX also 08/17/2012. The diarrhea has improved since he began taking Lomotil at night. He reports tingling in the feet at night. No other neuropathy symptoms. He continues to work. He has developed a cough in the evenings for the past few weeks. He continues to smoke.  Objective:  Vital signs in last 24 hours:  Blood pressure 151/85, pulse 88, temperature 98.1 F (36.7 C), temperature source Oral, resp. rate 20, height 5\' 10"  (1.778 m), weight 171 lb 3.2 oz (77.656 kg).    HEENT: No thrush or ulcers Resp: Lungs clear bilaterally Cardio: Regular rate and rhythm GI: No hepatomegaly, nontender Vascular: No leg edema Neuro: Mild decrease in vibratory sense at the fingertips bilaterally    Portacath/PICC-without erythema  Lab Results:  Lab Results  Component Value Date   WBC 4.5 08/31/2012   HGB 11.1* 08/31/2012   HCT 32.1* 08/31/2012   MCV 102.9* 08/31/2012   PLT 170 08/31/2012      Medications: I have reviewed the patient's current medications.  Assessment/Plan: 1. Metastatic rectal cancer status post biopsy of rectal mass 09/03/2011 with pathology showing invasive adenocarcinoma. Staging CT scans 09/03/2011 showed a 10 mm hypodense rounded lesion in left lateral hepatic lobe, similar 6 mm lesion in the superior right hepatic lobe and a smaller subcapsular lesion in the lateral right hepatic lobe; rectal mass within the lumen of the bowel emanating from the left wall measuring 4.9 x 3.5 cm and a mass within the low left perirectal fat measuring 2.3 x 3.2 cm consistent with local extension of carcinoma. Staging PET scan 09/18/2011 showed a 5.1 cm distal sigmoid/rectal mass with max SUV 19.2 with local extension into the left perirectal fat with max SUV 7.7. At least 2 suspected hepatic metastases with max SUV 6.7 in the lateral segment left hepatic  lobe and max SUV 4.1 in the lateral right hepatic dome. Additional tiny hypodensities in the liver may be cysts; patchy/nodular opacity in the left lower lobe with max SUV 3.5 worrisome for pulmonary metastasis, less likely infectious. He began radiation and concurrent Xeloda chemotherapy on 09/29/2011. He completed radiation on 11/06/2011. MRI of the liver on 12/08/2011 showed 2 hypermetabolic liver lesions, similar in size to 09/03/2011. 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 Equivocal 7 mm lesion in the inferior right hepatic lobe. No evidence of extrahepatic metastasis within the abdomen. An ultrasound-guided biopsy of a small left hepatic lesion on 12/23/2011 was nondiagnostic. He underwent a low anterior resection with loop ileostomy on 02/06/2012 with final pathology showing a 3.5 cm invasive adenocarcinoma with abundant extracellular mucin invading through the muscularis propria into pericolonic fatty tissue; 4 of 10 pericolonic lymph nodes were positive for metastatic carcinoma; resection margins were clear (ypT3,ypN2). He began FOLFOX chemotherapy on 03/16/2012. He completed 2 cycles of FOLFOX chemotherapy and underwent an ileostomy reversal 05/05/2012. FOLFOX was resumed on 06/14/2012. 2. CEA 13.8 on 09/17/2011; 1.6 on 12/01/2011; 8.4 on 03/09/2012, 2.1 on 05/03/2012; 2.5 on 07/26/2012. 3. K-ras mutation detected. 4. Rectal pain and bleeding secondary to #1. Improved. 5. Frequent loose stools secondary to #1. Improved. 6. Hypertension. 7. Tobacco use. 8. CT scan 09/03/2011 with chronic right hydronephrosis secondary to an obstructing calculus in the mid right ureter. He is followed by Dr. Brunilda Payor.  9. Status post cystoscopy, right retrograde pyelogram, ureteroscopy and insertion  of right double-J stent 11/28/2011. The double-J stent was replaced 02/06/2012. The stent has been removed. 10. Elevated BUN/creatinine. Likely secondary to  dehydration while the ileostomy was in place. Improved. He is followed by Dr. Lowell Guitar. 11. Status post ultrasound-guided fine-needle aspiration of small left hepatic lesion 12/22/2011. The pathology revealed no malignancy. 12. Malaise and weight loss. Resolved. 13. High output ileostomy. Partially improved with Imodium. Status post ileostomy reversal 05/05/2012. 14. Erectile dysfunction. He is followed by Dr. Brunilda Payor. 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 16. Diarrhea after FOLFOX chemotherapy given on 07/26/2012-resolved. The bolus and infusional 5-FU will be dose reduced by 20% beginning with FOLFOX on 08/17/2012. He reports improvement in the diarrhea since he began taking Lomotil in the evening.   Disposition:  Mr. Markoff appears stable. The plan is to proceed with FOLFOX today. He will return for a final planned cycle of chemotherapy in 2 weeks.   Thornton Papas, MD  08/31/2012  6:56 PM

## 2012-08-31 NOTE — Telephone Encounter (Signed)
Gave pt appt for September and October 2013 DC pump, lab, chemo and MD

## 2012-08-31 NOTE — Patient Instructions (Signed)
[facility] Discharge Instructions for Patients Receiving Chemotherapy  Today you received the following chemotherapy agents Folfox  To help prevent nausea and vomiting after your treatment, we encourage you to take your nausea medication as directed.   If you develop nausea and vomiting that is not controlled by your nausea medication, call the clinic. If it is after clinic hours your family physician or the after hours number for the clinic or go to the Emergency Department.   BELOW ARE SYMPTOMS THAT SHOULD BE REPORTED IMMEDIATELY:  *FEVER GREATER THAN 100.5 F  *CHILLS WITH OR WITHOUT FEVER  NAUSEA AND VOMITING THAT IS NOT CONTROLLED WITH YOUR NAUSEA MEDICATION  *UNUSUAL SHORTNESS OF BREATH  *UNUSUAL BRUISING OR BLEEDING  TENDERNESS IN MOUTH AND THROAT WITH OR WITHOUT PRESENCE OF ULCERS  *URINARY PROBLEMS  *BOWEL PROBLEMS  UNUSUAL RASH Items with * indicate a potential emergency and should be followed up as soon as possible.  One of the nurses will contact you 24 hours after your treatment. Please let the nurse know about any problems that you may have experienced. Feel free to call the clinic you have any questions or concerns. The clinic phone number is [phone].   I have been informed and understand all the instructions given to me. I know to contact the clinic, my physician, or go to the Emergency Department if any problems should occur. I do not have any questions at this time, but understand that I may call the clinic during office hours   should I have any questions or need assistance in obtaining follow up care.    __________________________________________  _____________  __________ Signature of Patient or Authorized Representative            Date                   Time    __________________________________________ Nurse's Signature    

## 2012-09-01 ENCOUNTER — Other Ambulatory Visit: Payer: Self-pay | Admitting: Oncology

## 2012-09-02 ENCOUNTER — Ambulatory Visit (HOSPITAL_BASED_OUTPATIENT_CLINIC_OR_DEPARTMENT_OTHER): Payer: 59

## 2012-09-02 VITALS — BP 139/83 | HR 84 | Temp 98.4°F

## 2012-09-02 DIAGNOSIS — C20 Malignant neoplasm of rectum: Secondary | ICD-10-CM

## 2012-09-02 DIAGNOSIS — Z452 Encounter for adjustment and management of vascular access device: Secondary | ICD-10-CM

## 2012-09-02 MED ORDER — HEPARIN SOD (PORK) LOCK FLUSH 100 UNIT/ML IV SOLN
500.0000 [IU] | Freq: Once | INTRAVENOUS | Status: AC | PRN
  Administered 2012-09-02: 500 [IU]
  Filled 2012-09-02: qty 5

## 2012-09-02 MED ORDER — SODIUM CHLORIDE 0.9 % IJ SOLN
10.0000 mL | INTRAMUSCULAR | Status: DC | PRN
  Administered 2012-09-02: 10 mL
  Filled 2012-09-02: qty 10

## 2012-09-12 ENCOUNTER — Other Ambulatory Visit: Payer: Self-pay | Admitting: Oncology

## 2012-09-14 ENCOUNTER — Other Ambulatory Visit (HOSPITAL_BASED_OUTPATIENT_CLINIC_OR_DEPARTMENT_OTHER): Payer: 59 | Admitting: Lab

## 2012-09-14 ENCOUNTER — Telehealth: Payer: Self-pay | Admitting: Oncology

## 2012-09-14 ENCOUNTER — Ambulatory Visit (HOSPITAL_BASED_OUTPATIENT_CLINIC_OR_DEPARTMENT_OTHER): Payer: 59

## 2012-09-14 ENCOUNTER — Ambulatory Visit (HOSPITAL_BASED_OUTPATIENT_CLINIC_OR_DEPARTMENT_OTHER): Payer: 59 | Admitting: Nurse Practitioner

## 2012-09-14 VITALS — BP 151/79 | HR 90 | Temp 98.3°F | Resp 20 | Ht 70.0 in | Wt 171.5 lb

## 2012-09-14 DIAGNOSIS — C787 Secondary malignant neoplasm of liver and intrahepatic bile duct: Secondary | ICD-10-CM

## 2012-09-14 DIAGNOSIS — Z5111 Encounter for antineoplastic chemotherapy: Secondary | ICD-10-CM

## 2012-09-14 DIAGNOSIS — R197 Diarrhea, unspecified: Secondary | ICD-10-CM

## 2012-09-14 DIAGNOSIS — C20 Malignant neoplasm of rectum: Secondary | ICD-10-CM

## 2012-09-14 DIAGNOSIS — G589 Mononeuropathy, unspecified: Secondary | ICD-10-CM

## 2012-09-14 LAB — CBC WITH DIFFERENTIAL/PLATELET
BASO%: 0.2 % (ref 0.0–2.0)
EOS%: 0.6 % (ref 0.0–7.0)
HCT: 33.4 % — ABNORMAL LOW (ref 38.4–49.9)
LYMPH%: 13.1 % — ABNORMAL LOW (ref 14.0–49.0)
MCH: 36.1 pg — ABNORMAL HIGH (ref 27.2–33.4)
MCHC: 34.7 g/dL (ref 32.0–36.0)
MONO#: 0.6 10*3/uL (ref 0.1–0.9)
NEUT%: 75.6 % — ABNORMAL HIGH (ref 39.0–75.0)
Platelets: 158 10*3/uL (ref 140–400)
RBC: 3.21 10*6/uL — ABNORMAL LOW (ref 4.20–5.82)
WBC: 5.4 10*3/uL (ref 4.0–10.3)

## 2012-09-14 LAB — COMPREHENSIVE METABOLIC PANEL (CC13)
ALT: 9 U/L (ref 0–55)
AST: 13 U/L (ref 5–34)
Alkaline Phosphatase: 109 U/L (ref 40–150)
CO2: 20 mEq/L — ABNORMAL LOW (ref 22–29)
Creatinine: 1.5 mg/dL — ABNORMAL HIGH (ref 0.7–1.3)
Sodium: 141 mEq/L (ref 136–145)
Total Bilirubin: 0.7 mg/dL (ref 0.20–1.20)
Total Protein: 7 g/dL (ref 6.4–8.3)

## 2012-09-14 MED ORDER — OXALIPLATIN CHEMO INJECTION 100 MG/20ML
65.0000 mg/m2 | Freq: Once | INTRAVENOUS | Status: AC
  Administered 2012-09-14: 125 mg via INTRAVENOUS
  Filled 2012-09-14: qty 25

## 2012-09-14 MED ORDER — DEXAMETHASONE SODIUM PHOSPHATE 10 MG/ML IJ SOLN
10.0000 mg | Freq: Once | INTRAMUSCULAR | Status: AC
  Administered 2012-09-14: 10 mg via INTRAVENOUS

## 2012-09-14 MED ORDER — FLUOROURACIL CHEMO INJECTION 2.5 GM/50ML
320.0000 mg/m2 | Freq: Once | INTRAVENOUS | Status: AC
  Administered 2012-09-14: 600 mg via INTRAVENOUS
  Filled 2012-09-14: qty 12

## 2012-09-14 MED ORDER — SODIUM CHLORIDE 0.9 % IJ SOLN
10.0000 mL | INTRAMUSCULAR | Status: DC | PRN
  Filled 2012-09-14: qty 10

## 2012-09-14 MED ORDER — SODIUM CHLORIDE 0.9 % IV SOLN
1920.0000 mg/m2 | INTRAVENOUS | Status: DC
  Administered 2012-09-14: 3650 mg via INTRAVENOUS
  Filled 2012-09-14: qty 73

## 2012-09-14 MED ORDER — ONDANSETRON 8 MG/50ML IVPB (CHCC)
8.0000 mg | Freq: Once | INTRAVENOUS | Status: AC
  Administered 2012-09-14: 8 mg via INTRAVENOUS

## 2012-09-14 MED ORDER — DEXTROSE 5 % IV SOLN
600.0000 mg | Freq: Once | INTRAVENOUS | Status: AC
  Administered 2012-09-14: 600 mg via INTRAVENOUS
  Filled 2012-09-14: qty 30

## 2012-09-14 MED ORDER — HEPARIN SOD (PORK) LOCK FLUSH 100 UNIT/ML IV SOLN
500.0000 [IU] | Freq: Once | INTRAVENOUS | Status: DC | PRN
  Filled 2012-09-14: qty 5

## 2012-09-14 MED ORDER — DEXTROSE 5 % IV SOLN
Freq: Once | INTRAVENOUS | Status: AC
  Administered 2012-09-14: 10:00:00 via INTRAVENOUS

## 2012-09-14 NOTE — Progress Notes (Signed)
OFFICE PROGRESS NOTE  Interval history:  [name] [name] returns as scheduled. He completed another cycle of FOLFOX on 08/31/2012. He denies nausea/vomiting. No mouth sores. He had a few loose stools. He took Lomotil with improvement. He has fairly consistent numbness in the feet. No numbness in the hands. The numbness does not interfere with activity.   Objective: Blood pressure 151/79, pulse 90, temperature 98.3 F (36.8 C), temperature source Oral, resp. rate 20, height 5\' 10"  (1.778 m), weight 171 lb 8 oz (77.792 kg).  Oropharynx is without thrush or ulceration. Lungs are clear. Regular cardiac rhythm. Port-A-Cath site is without erythema. Abdomen is soft and nontender. No hepatomegaly. Extremities are without edema. Calves are soft and nontender. Vibratory sense is moderately decreased over the fingertips and toes per tuning fork exam.  Lab Results: Lab Results  Component Value Date   WBC 5.4 09/14/2012   HGB 11.6* 09/14/2012   HCT 33.4* 09/14/2012   MCV 104.0* 09/14/2012   PLT 158 09/14/2012    Chemistry:    Chemistry      Component Value Date/Time   NA 141 09/14/2012 0840   NA 141 07/26/2012 1008   K 3.4* 09/14/2012 0840   K 3.7 07/26/2012 1008   CL 108* 09/14/2012 0840   CL 109 07/26/2012 1008   CO2 20* 09/14/2012 0840   CO2 22 07/26/2012 1008   BUN 24.0 09/14/2012 0840   BUN 24* 07/26/2012 1008   CREATININE 1.5* 09/14/2012 0840   CREATININE 1.44* 07/26/2012 1008      Component Value Date/Time   CALCIUM 9.2 09/14/2012 0840   CALCIUM 8.7 07/26/2012 1008   ALKPHOS 109 09/14/2012 0840   ALKPHOS 99 07/26/2012 1008   AST 13 09/14/2012 0840   AST 13 07/26/2012 1008   ALT 9 09/14/2012 0840   ALT 10 07/26/2012 1008   BILITOT 0.70 09/14/2012 0840   BILITOT 0.5 07/26/2012 1008       Studies/Results: No results found.  Medications: I have reviewed the patient's current medications.  Assessment/Plan:  1. Metastatic rectal cancer status post biopsy of rectal mass 09/03/2011 with pathology  showing invasive adenocarcinoma. Staging CT scans 09/03/2011 showed a 10 mm hypodense rounded lesion in left lateral hepatic lobe, similar 6 mm lesion in the superior right hepatic lobe and a smaller subcapsular lesion in the lateral right hepatic lobe; rectal mass within the lumen of the bowel emanating from the left wall measuring 4.9 x 3.5 cm and a mass within the low left perirectal fat measuring 2.3 x 3.2 cm consistent with local extension of carcinoma. Staging PET scan 09/18/2011 showed a 5.1 cm distal sigmoid/rectal mass with max SUV 19.2 with local extension into the left perirectal fat with max SUV 7.7. At least 2 suspected hepatic metastases with max SUV 6.7 in the lateral segment left hepatic lobe and max SUV 4.1 in the lateral right hepatic dome. Additional tiny hypodensities in the liver may be cysts; patchy/nodular opacity in the left lower lobe with max SUV 3.5 worrisome for pulmonary metastasis, less likely infectious. He began radiation and concurrent Xeloda chemotherapy on 09/29/2011. He completed radiation on 11/06/2011. MRI of the liver on 12/08/2011 showed 2 hypermetabolic liver lesions, similar in size to 09/03/2011. 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 Equivocal 7 mm lesion in the inferior right hepatic lobe. No evidence of extrahepatic metastasis within the abdomen. An ultrasound-guided biopsy of a small left hepatic lesion on 12/23/2011 was nondiagnostic. He underwent a low  anterior resection with loop ileostomy on 02/06/2012 with final pathology showing a 3.5 cm invasive adenocarcinoma with abundant extracellular mucin invading through the muscularis propria into pericolonic fatty tissue; 4 of 10 pericolonic lymph nodes were positive for metastatic carcinoma; resection margins were clear (ypT3,ypN2). He began FOLFOX chemotherapy on 03/16/2012. He completed 2 cycles of FOLFOX chemotherapy and underwent an ileostomy reversal  05/05/2012. FOLFOX was resumed on 06/14/2012. 2. CEA 13.8 on 09/17/2011; 1.6 on 12/01/2011; 8.4 on 03/09/2012, 2.1 on 05/03/2012; 2.5 on 07/26/2012. 3. K-ras mutation detected. 4. Rectal pain and bleeding secondary to #1. Improved. 5. Frequent loose stools secondary to #1. Improved. 6. Hypertension. 7. Tobacco use. 8. CT scan 09/03/2011 with chronic right hydronephrosis secondary to an obstructing calculus in the mid right ureter. He is followed by Dr. Brunilda Payor.  9. Status post cystoscopy, right retrograde pyelogram, ureteroscopy and insertion of right double-J stent 11/28/2011. The double-J stent was replaced 02/06/2012. The stent has been removed. 10. Elevated BUN/creatinine. Likely secondary to dehydration while the ileostomy was in place. Improved. He is followed by Dr. Lowell Guitar. 11. Status post ultrasound-guided fine-needle aspiration of small left hepatic lesion 12/22/2011. The pathology revealed no malignancy. 12. Malaise and weight loss. Resolved. 13. High output ileostomy. Partially improved with Imodium. Status post ileostomy reversal 05/05/2012. 14. Erectile dysfunction. He is followed by Dr. Brunilda Payor. 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 16. Diarrhea after FOLFOX chemotherapy given on 07/26/2012-resolved. The bolus and infusional 5-FU will be dose reduced by 20% beginning with FOLFOX on 08/17/2012. He reports improvement in the diarrhea since he began taking Lomotil.  Disposition-[name] appears stable. Plan to proceed with the final cycle of FOLFOX chemotherapy today as scheduled. He will return for a followup visit and port flush in one month. He will contact the office in the interim with any problems.  Plan reviewed with Dr. Truett Perna.  Lonna Cobb ANP/GNP-BC

## 2012-09-14 NOTE — Telephone Encounter (Signed)
Gave patient appt for November 5th lab and MD

## 2012-09-14 NOTE — Patient Instructions (Addendum)
[facility] Discharge Instructions for Patients Receiving Chemotherapy  Today you received the following chemotherapy agents Oxaliplatin, Leucovorin, 5FU.  To help prevent nausea and vomiting after your treatment, we encourage you to take your nausea medication as prescribed.   If you develop nausea and vomiting that is not controlled by your nausea medication, call the clinic. If it is after clinic hours your family physician or the after hours number for the clinic or go to the Emergency Department.   BELOW ARE SYMPTOMS THAT SHOULD BE REPORTED IMMEDIATELY:  *FEVER GREATER THAN 100.5 F  *CHILLS WITH OR WITHOUT FEVER  NAUSEA AND VOMITING THAT IS NOT CONTROLLED WITH YOUR NAUSEA MEDICATION  *UNUSUAL SHORTNESS OF BREATH  *UNUSUAL BRUISING OR BLEEDING  TENDERNESS IN MOUTH AND THROAT WITH OR WITHOUT PRESENCE OF ULCERS  *URINARY PROBLEMS  *BOWEL PROBLEMS  UNUSUAL RASH Items with * indicate a potential emergency and should be followed up as soon as possible.  One of the nurses will contact you 24 hours after your treatment. Please let the nurse know about any problems that you may have experienced. Feel free to call the clinic you have any questions or concerns. The clinic phone number is [phone].   I have been informed and understand all the instructions given to me. I know to contact the clinic, my physician, or go to the Emergency Department if any problems should occur. I do not have any questions at this time, but understand that I may call the clinic during office hours   should I have any questions or need assistance in obtaining follow up care.    __________________________________________  _____________  __________ Signature of Patient or Authorized Representative            Date                   Time    __________________________________________ Nurse's Signature

## 2012-09-16 ENCOUNTER — Ambulatory Visit (HOSPITAL_BASED_OUTPATIENT_CLINIC_OR_DEPARTMENT_OTHER): Payer: 59

## 2012-09-16 VITALS — BP 148/75 | HR 94 | Temp 98.3°F

## 2012-09-16 DIAGNOSIS — Z452 Encounter for adjustment and management of vascular access device: Secondary | ICD-10-CM

## 2012-09-16 DIAGNOSIS — C20 Malignant neoplasm of rectum: Secondary | ICD-10-CM

## 2012-09-16 MED ORDER — HEPARIN SOD (PORK) LOCK FLUSH 100 UNIT/ML IV SOLN
500.0000 [IU] | Freq: Once | INTRAVENOUS | Status: AC | PRN
  Administered 2012-09-16: 500 [IU]
  Filled 2012-09-16: qty 5

## 2012-09-16 MED ORDER — SODIUM CHLORIDE 0.9 % IJ SOLN
10.0000 mL | INTRAMUSCULAR | Status: DC | PRN
  Administered 2012-09-16: 10 mL
  Filled 2012-09-16: qty 10

## 2012-09-16 NOTE — Patient Instructions (Signed)
Call MD with any questions 

## 2012-09-26 ENCOUNTER — Other Ambulatory Visit: Payer: Self-pay | Admitting: Oncology

## 2012-09-28 ENCOUNTER — Other Ambulatory Visit: Payer: 59 | Admitting: Lab

## 2012-09-28 ENCOUNTER — Other Ambulatory Visit: Payer: Self-pay | Admitting: Medical Oncology

## 2012-09-28 ENCOUNTER — Inpatient Hospital Stay: Payer: 59

## 2012-09-28 DIAGNOSIS — C20 Malignant neoplasm of rectum: Secondary | ICD-10-CM

## 2012-10-12 ENCOUNTER — Other Ambulatory Visit: Payer: Self-pay | Admitting: *Deleted

## 2012-10-12 ENCOUNTER — Other Ambulatory Visit (HOSPITAL_BASED_OUTPATIENT_CLINIC_OR_DEPARTMENT_OTHER): Payer: 59 | Admitting: Lab

## 2012-10-12 ENCOUNTER — Telehealth: Payer: Self-pay | Admitting: Oncology

## 2012-10-12 ENCOUNTER — Ambulatory Visit: Payer: 59

## 2012-10-12 ENCOUNTER — Ambulatory Visit (HOSPITAL_BASED_OUTPATIENT_CLINIC_OR_DEPARTMENT_OTHER): Payer: 59 | Admitting: Oncology

## 2012-10-12 VITALS — BP 139/90 | HR 94 | Temp 97.4°F | Resp 20 | Ht 70.0 in | Wt 176.4 lb

## 2012-10-12 DIAGNOSIS — C19 Malignant neoplasm of rectosigmoid junction: Secondary | ICD-10-CM

## 2012-10-12 DIAGNOSIS — C20 Malignant neoplasm of rectum: Secondary | ICD-10-CM

## 2012-10-12 DIAGNOSIS — C7800 Secondary malignant neoplasm of unspecified lung: Secondary | ICD-10-CM

## 2012-10-12 DIAGNOSIS — C787 Secondary malignant neoplasm of liver and intrahepatic bile duct: Secondary | ICD-10-CM

## 2012-10-12 LAB — COMPREHENSIVE METABOLIC PANEL (CC13)
AST: 16 U/L (ref 5–34)
Alkaline Phosphatase: 112 U/L (ref 40–150)
BUN: 20 mg/dL (ref 7.0–26.0)
Creatinine: 1.5 mg/dL — ABNORMAL HIGH (ref 0.7–1.3)
Glucose: 138 mg/dl — ABNORMAL HIGH (ref 70–99)
Potassium: 3 mEq/L — ABNORMAL LOW (ref 3.5–5.1)
Total Bilirubin: 0.66 mg/dL (ref 0.20–1.20)

## 2012-10-12 LAB — CBC WITH DIFFERENTIAL/PLATELET
BASO%: 0.3 % (ref 0.0–2.0)
EOS%: 0.7 % (ref 0.0–7.0)
HCT: 35.8 % — ABNORMAL LOW (ref 38.4–49.9)
MCH: 37.9 pg — ABNORMAL HIGH (ref 27.2–33.4)
MCHC: 35.1 g/dL (ref 32.0–36.0)
MCV: 108 fL — ABNORMAL HIGH (ref 79.3–98.0)
MONO%: 7.2 % (ref 0.0–14.0)
NEUT%: 86.6 % — ABNORMAL HIGH (ref 39.0–75.0)
lymph#: 0.4 10*3/uL — ABNORMAL LOW (ref 0.9–3.3)

## 2012-10-12 MED ORDER — SODIUM CHLORIDE 0.9 % IJ SOLN
10.0000 mL | Freq: Once | INTRAMUSCULAR | Status: AC
  Administered 2012-10-12: 10 mL via INTRAVENOUS
  Filled 2012-10-12: qty 10

## 2012-10-12 MED ORDER — HEPARIN SOD (PORK) LOCK FLUSH 100 UNIT/ML IV SOLN
500.0000 [IU] | Freq: Once | INTRAVENOUS | Status: AC
  Administered 2012-10-12: 500 [IU] via INTRAVENOUS
  Filled 2012-10-12: qty 5

## 2012-10-12 MED ORDER — POTASSIUM CHLORIDE CRYS ER 20 MEQ PO TBCR: 20.0000 meq | EXTENDED_RELEASE_TABLET | Freq: Two times a day (BID) | ORAL | Status: DC

## 2012-10-12 NOTE — Progress Notes (Signed)
[facility]    OFFICE PROGRESS NOTE   INTERVAL HISTORY:   He completed a final planned cycle of chemotherapy on 09/14/2012. He reports mild numbness in the feet. This does not interfere with activity. No nausea or pain. He continues to have a diarrhea stool once or twice per week. The diarrhea resolves after he takes Lomotil.  [name] [name] continues to smoke one pack of cigarettes per day.  Objective:  Vital signs in last 24 hours:  Blood pressure 139/90, pulse 94, temperature 97.4 F (36.3 C), temperature source Oral, resp. rate 20, height 5\' 10"  (1.778 m), weight 176 lb 6.4 oz (80.015 kg).    HEENT: Neck without mass Lymphatics: No cervical, supraclavicular, axillary, or inguinal nodes Resp: Distant breath sounds, no respiratory distress Cardio: Regular rate and rhythm with premature beats GI: No hepatosplenomegaly, nontender Vascular: No leg edema  Portacath/PICC-without erythema  Lab Results:  Lab Results  Component Value Date   WBC 7.8 10/12/2012   HGB 12.6* 10/12/2012   HCT 35.8* 10/12/2012   MCV 108.0* 10/12/2012   PLT 146 10/12/2012   ANC 6.8, CEA pending    Medications: I have reviewed the patient's current medications.  Assessment/Plan: 1. Metastatic rectal cancer status post biopsy of rectal mass 09/03/2011 with pathology showing invasive adenocarcinoma. Staging CT scans 09/03/2011 showed a 10 mm hypodense rounded lesion in left lateral hepatic lobe, similar 6 mm lesion in the superior right hepatic lobe and a smaller subcapsular lesion in the lateral right hepatic lobe; rectal mass within the lumen of the bowel emanating from the left wall measuring 4.9 x 3.5 cm and a mass within the low left perirectal fat measuring 2.3 x 3.2 cm consistent with local extension of carcinoma. Staging PET scan 09/18/2011 showed a 5.1 cm distal sigmoid/rectal mass with max SUV 19.2 with local extension into the left perirectal fat with max SUV 7.7. At least 2  suspected hepatic metastases with max SUV 6.7 in the lateral segment left hepatic lobe and max SUV 4.1 in the lateral right hepatic dome. Additional tiny hypodensities in the liver may be cysts; patchy/nodular opacity in the left lower lobe with max SUV 3.5 worrisome for pulmonary metastasis, less likely infectious. He began radiation and concurrent Xeloda chemotherapy on 09/29/2011. He completed radiation on 11/06/2011. MRI of the liver on 12/08/2011 showed 2 hypermetabolic liver lesions, similar in size to 09/03/2011. 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 Equivocal 7 mm lesion in the inferior right hepatic lobe. No evidence of extrahepatic metastasis within the abdomen. An ultrasound-guided biopsy of a small left hepatic lesion on 12/23/2011 was nondiagnostic. He underwent a low anterior resection with loop ileostomy on 02/06/2012 with final pathology showing a 3.5 cm invasive adenocarcinoma with abundant extracellular mucin invading through the muscularis propria into pericolonic fatty tissue; 4 of 10 pericolonic lymph nodes were positive for metastatic carcinoma; resection margins were clear (ypT3,ypN2). He began FOLFOX chemotherapy on 03/16/2012. He completed 2 cycles of FOLFOX chemotherapy and underwent an ileostomy reversal 05/05/2012. FOLFOX was resumed on 06/14/2012 and completed on 09/14/2012. 2. CEA 13.8 on 09/17/2011; 1.6 on 12/01/2011; 8.4 on 03/09/2012, 2.1 on 05/03/2012; 2.5 on 07/26/2012. 3. K-ras mutation detected. 4. Rectal pain and bleeding secondary to #1. Improved. 5. Frequent loose stools secondary to #1. Improved. 6. Hypertension. 7. Tobacco use. 8. CT scan 09/03/2011 with chronic right hydronephrosis secondary to an obstructing calculus in the mid right ureter. He is followed by Dr. Brunilda Payor.  9. Status post cystoscopy, right retrograde pyelogram, ureteroscopy and insertion of right double-J stent 11/28/2011. The double-J  stent was replaced 02/06/2012. The stent has been removed. 10. Elevated BUN/creatinine. Likely secondary to dehydration while the ileostomy was in place. Improved. He is followed by Dr. Lowell Guitar. 11. Status post ultrasound-guided fine-needle aspiration of small left hepatic lesion 12/22/2011. The pathology revealed no malignancy. 12. Malaise and weight loss. Resolved. 13. High output ileostomy. Partially improved with Imodium. Status post ileostomy reversal 05/05/2012. 14. Erectile dysfunction. He is followed by Dr. Brunilda Payor. 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 16. Diarrhea after FOLFOX chemotherapy given on 07/26/2012-resolved. The bolus and infusional 5-FU were dose reduced by 20% beginning with FOLFOX on 08/17/2012. He reports improvement in the diarrhea since he began taking Lomotil. 17. Irregular heart rate-he reports this is a chronic problem. He will followup with Dr. Lovell Sheehan to discuss the need for additional evaluation.    Disposition:  [name] [name] has completed the planned course of "adjuvant" chemotherapy. We will followup on the CEA from today. The Port-A-Cath was flushed today. [name] [name] will return for an office visit and Port-A-Cath flush in 6 weeks. The plan is to follow his clinical status and CEA. We will arrange for restaging CT scans if the CEA rises or within the next 6 months.   Thornton Papas, MD  10/12/2012  11:19 AM

## 2012-10-12 NOTE — Telephone Encounter (Signed)
Called and spoke with pt; per Dr. Truett Perna potassium is low and instructed pt to take his potassium pill twice a day--refill order sent to [facility] on [address].  Pt verbalized understanding and confirmed appt for 11/23/12.

## 2012-10-12 NOTE — Telephone Encounter (Signed)
gv and printed pt appt schedule for Dec °

## 2012-10-18 ENCOUNTER — Telehealth: Payer: Self-pay | Admitting: *Deleted

## 2012-10-18 NOTE — Telephone Encounter (Signed)
Notified patient via voice mail to continue his Ktab 20 meq twice daily. Will recheck on 12/5. Made him aware that CEA result returned normal.

## 2012-10-18 NOTE — Telephone Encounter (Signed)
Message copied by Wandalee Ferdinand on Mon Oct 18, 2012  5:13 PM ------      Message from: Ladene Artist      Created: Fri Oct 15, 2012  8:39 PM       Did we remind him to take the kcl on 11/5?

## 2012-11-02 ENCOUNTER — Encounter: Payer: Self-pay | Admitting: Family Medicine

## 2012-11-02 ENCOUNTER — Ambulatory Visit (INDEPENDENT_AMBULATORY_CARE_PROVIDER_SITE_OTHER): Payer: 59 | Admitting: Family Medicine

## 2012-11-02 VITALS — BP 148/86 | HR 95 | Temp 98.6°F | Wt 175.0 lb

## 2012-11-02 DIAGNOSIS — I499 Cardiac arrhythmia, unspecified: Secondary | ICD-10-CM

## 2012-11-02 DIAGNOSIS — J40 Bronchitis, not specified as acute or chronic: Secondary | ICD-10-CM

## 2012-11-02 DIAGNOSIS — I10 Essential (primary) hypertension: Secondary | ICD-10-CM

## 2012-11-02 MED ORDER — AZITHROMYCIN 250 MG PO TABS: ORAL_TABLET | ORAL | Status: DC

## 2012-11-02 MED ORDER — AMLODIPINE BESYLATE 10 MG PO TABS: 10.0000 mg | ORAL_TABLET | Freq: Every day | ORAL | Status: DC

## 2012-11-02 NOTE — Progress Notes (Signed)
  Subjective:    Patient ID: [name] [name], male    DOB: 1951-06-12, 61 y.o.   MRN: [id number]  HPI Here to check for an irregular heartbeat detected on his visit with Dr. Truett Perna on 10-12-12. He has no hx of this, and he has no symptoms at all. No palpitations or chest pain or SOB. He does mention that he has had some chest congestion and a dry cough for about 4 weeks. Some stuffy head and PND. No ST or fever. Finally I see his BP has been borderline high for the past year.    Review of Systems  Constitutional: Negative.   HENT: Positive for congestion and postnasal drip.   Eyes: Negative.   Respiratory: Positive for cough. Negative for apnea, choking, chest tightness, shortness of breath, wheezing and stridor.   Cardiovascular: Negative.        Objective:   Physical Exam  Constitutional: He appears well-developed and well-nourished. No distress.  HENT:  Right Ear: External ear normal.  Left Ear: External ear normal.  Nose: Nose normal.  Mouth/Throat: Oropharynx is clear and moist.  Eyes: Conjunctivae normal are normal.  Neck: No thyromegaly present.  Cardiovascular: Normal rate, normal heart sounds and intact distal pulses.  Exam reveals no gallop and no friction rub.   No murmur heard.      Rhythm is regular with an occasional ectopic beat. EKG is normal with a single PAC  Pulmonary/Chest: Effort normal and breath sounds normal. No respiratory distress. He has no wheezes. He has no rales.  Lymphadenopathy:    He has no cervical adenopathy.          Assessment & Plan:  He has sinus rhythm with occasional PACs, and this is benign. I reassured him that this is nothing to worry about. We will increase his Norvasc to 10 mg a day, and I asked him to follow up with Dr. Lovell Sheehan in 4-6 weeks to recheck the BP. Treat the bronchitis with a Zpack.

## 2012-11-23 ENCOUNTER — Ambulatory Visit (HOSPITAL_BASED_OUTPATIENT_CLINIC_OR_DEPARTMENT_OTHER): Payer: 59

## 2012-11-23 ENCOUNTER — Ambulatory Visit (HOSPITAL_BASED_OUTPATIENT_CLINIC_OR_DEPARTMENT_OTHER): Payer: 59 | Admitting: Nurse Practitioner

## 2012-11-23 ENCOUNTER — Other Ambulatory Visit (HOSPITAL_BASED_OUTPATIENT_CLINIC_OR_DEPARTMENT_OTHER): Payer: 59 | Admitting: Lab

## 2012-11-23 ENCOUNTER — Telehealth: Payer: Self-pay | Admitting: Oncology

## 2012-11-23 VITALS — BP 136/73 | HR 81 | Temp 97.1°F | Resp 20 | Ht 70.0 in | Wt 179.4 lb

## 2012-11-23 VITALS — BP 148/78 | HR 88 | Temp 97.8°F

## 2012-11-23 DIAGNOSIS — C20 Malignant neoplasm of rectum: Secondary | ICD-10-CM

## 2012-11-23 DIAGNOSIS — C19 Malignant neoplasm of rectosigmoid junction: Secondary | ICD-10-CM

## 2012-11-23 DIAGNOSIS — Z23 Encounter for immunization: Secondary | ICD-10-CM

## 2012-11-23 DIAGNOSIS — E860 Dehydration: Secondary | ICD-10-CM

## 2012-11-23 LAB — BASIC METABOLIC PANEL (CC13)
BUN: 28 mg/dL — ABNORMAL HIGH (ref 7.0–26.0)
CO2: 24 mEq/L (ref 22–29)
Calcium: 9 mg/dL (ref 8.4–10.4)
Chloride: 109 mEq/L — ABNORMAL HIGH (ref 98–107)
Creatinine: 1.6 mg/dL — ABNORMAL HIGH (ref 0.7–1.3)

## 2012-11-23 LAB — CEA: CEA: 3 ng/mL (ref 0.0–5.0)

## 2012-11-23 MED ORDER — HEPARIN SOD (PORK) LOCK FLUSH 100 UNIT/ML IV SOLN
500.0000 [IU] | Freq: Once | INTRAVENOUS | Status: AC
  Administered 2012-11-23: 500 [IU] via INTRAVENOUS
  Filled 2012-11-23: qty 5

## 2012-11-23 MED ORDER — INFLUENZA VIRUS VACC SPLIT PF IM SUSP
0.5000 mL | Freq: Once | INTRAMUSCULAR | Status: AC
  Administered 2012-11-23: 0.5 mL via INTRAMUSCULAR
  Filled 2012-11-23: qty 0.5

## 2012-11-23 MED ORDER — SODIUM CHLORIDE 0.9 % IJ SOLN
10.0000 mL | INTRAMUSCULAR | Status: DC | PRN
  Administered 2012-11-23: 10 mL via INTRAVENOUS
  Filled 2012-11-23: qty 10

## 2012-11-23 MED ORDER — PNEUMOCOCCAL VAC POLYVALENT 25 MCG/0.5ML IJ INJ
0.5000 mL | INJECTION | Freq: Once | INTRAMUSCULAR | Status: AC
  Administered 2012-11-23: 0.5 mL via INTRAMUSCULAR
  Filled 2012-11-23: qty 0.5

## 2012-11-23 NOTE — Progress Notes (Signed)
OFFICE PROGRESS NOTE  Interval history:  [name] [name] returns as scheduled. He feels well. He has a good appetite. He is gaining weight. Bowels overall moving regularly. He has occasional diarrhea. He denies abdominal pain. He has stable mild dyspnea on exertion. No nausea or vomiting. He has persistent tingling in the feet.   Objective: Blood pressure 136/73, pulse 81, temperature 97.1 F (36.2 C), temperature source Oral, resp. rate 20, height 5\' 10"  (1.778 m), weight 179 lb 6.4 oz (81.375 kg).  Oropharynx is without thrush or ulceration. No palpable cervical, supraclavicular, axillary or inguinal lymph nodes. Biaxillary/chest wall fullness, question prominent fat pads. Lungs are clear. Regular cardiac rhythm. Port-A-Cath site is without erythema. Abdomen is soft and nontender. No organomegaly. Extremities are without edema.  Lab Results: Lab Results  Component Value Date   WBC 7.8 10/12/2012   HGB 12.6* 10/12/2012   HCT 35.8* 10/12/2012   MCV 108.0* 10/12/2012   PLT 146 10/12/2012    Chemistry:    Chemistry      Component Value Date/Time   NA 142 11/23/2012 0916   NA 141 07/26/2012 1008   K 3.7 11/23/2012 0916   K 3.7 07/26/2012 1008   CL 109* 11/23/2012 0916   CL 109 07/26/2012 1008   CO2 24 11/23/2012 0916   CO2 22 07/26/2012 1008   BUN 28.0* 11/23/2012 0916   BUN 24* 07/26/2012 1008   CREATININE 1.6* 11/23/2012 0916   CREATININE 1.44* 07/26/2012 1008      Component Value Date/Time   CALCIUM 9.0 11/23/2012 0916   CALCIUM 8.7 07/26/2012 1008   ALKPHOS 112 10/12/2012 0934   ALKPHOS 99 07/26/2012 1008   AST 16 10/12/2012 0934   AST 13 07/26/2012 1008   ALT 12 10/12/2012 0934   ALT 10 07/26/2012 1008   BILITOT 0.66 10/12/2012 0934   BILITOT 0.5 07/26/2012 1008       Studies/Results: No results found.  Medications: I have reviewed the patient's current medications.  Assessment/Plan:  1. Metastatic rectal cancer status post biopsy of rectal mass 09/03/2011 with pathology showing  invasive adenocarcinoma. Staging CT scans 09/03/2011 showed a 10 mm hypodense rounded lesion in left lateral hepatic lobe, similar 6 mm lesion in the superior right hepatic lobe and a smaller subcapsular lesion in the lateral right hepatic lobe; rectal mass within the lumen of the bowel emanating from the left wall measuring 4.9 x 3.5 cm and a mass within the low left perirectal fat measuring 2.3 x 3.2 cm consistent with local extension of carcinoma. Staging PET scan 09/18/2011 showed a 5.1 cm distal sigmoid/rectal mass with max SUV 19.2 with local extension into the left perirectal fat with max SUV 7.7. At least 2 suspected hepatic metastases with max SUV 6.7 in the lateral segment left hepatic lobe and max SUV 4.1 in the lateral right hepatic dome. Additional tiny hypodensities in the liver may be cysts; patchy/nodular opacity in the left lower lobe with max SUV 3.5 worrisome for pulmonary metastasis, less likely infectious. He began radiation and concurrent Xeloda chemotherapy on 09/29/2011. He completed radiation on 11/06/2011. MRI of the liver on 12/08/2011 showed 2 hypermetabolic liver lesions, similar in size to 09/03/2011. 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 Equivocal 7 mm lesion in the inferior right hepatic lobe. No evidence of extrahepatic metastasis within the abdomen. An ultrasound-guided biopsy of a small left hepatic lesion on 12/23/2011 was nondiagnostic. He underwent a low anterior resection with loop ileostomy on  02/06/2012 with final pathology showing a 3.5 cm invasive adenocarcinoma with abundant extracellular mucin invading through the muscularis propria into pericolonic fatty tissue; 4 of 10 pericolonic lymph nodes were positive for metastatic carcinoma; resection margins were clear (ypT3,ypN2). He began FOLFOX chemotherapy on 03/16/2012. He completed 2 cycles of FOLFOX chemotherapy and underwent an ileostomy reversal  05/05/2012. FOLFOX was resumed on 06/14/2012 and completed on 09/14/2012. 2. CEA 13.8 on 09/17/2011; 1.6 on 12/01/2011; 8.4 on 03/09/2012, 2.1 on 05/03/2012; 2.5 on 07/26/2012; 1.8 on 10/12/2012. 3. K-ras mutation detected. 4. Rectal pain and bleeding secondary to #1. Resolved. 5. Frequent loose stools secondary to #1. Improved. 6. Hypertension. 7. Tobacco use. 8. CT scan 09/03/2011 with chronic right hydronephrosis secondary to an obstructing calculus in the mid right ureter. He is followed by Dr. Brunilda Payor.  9. Status post cystoscopy, right retrograde pyelogram, ureteroscopy and insertion of right double-J stent 11/28/2011. The double-J stent was replaced 02/06/2012. The stent has been removed. 10. Elevated BUN/creatinine. Likely secondary to dehydration while the ileostomy was in place. Improved. He is followed by Dr. Lowell Guitar. 11. Status post ultrasound-guided fine-needle aspiration of small left hepatic lesion 12/22/2011. The pathology revealed no malignancy. 12. Malaise and weight loss. Resolved. 13. High output ileostomy. Partially improved with Imodium. Status post ileostomy reversal 05/05/2012. 14. Erectile dysfunction. He is followed by Dr. Brunilda Payor. 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 16. Diarrhea after FOLFOX chemotherapy given on 07/26/2012-resolved. The bolus and infusional 5-FU were dose reduced by 20% beginning with FOLFOX on 08/17/2012. He reports improvement in the diarrhea since he began taking Lomotil. 17. Irregular heart rate-he reports this is a chronic problem. Status post evaluation by Dr. Clent Ridges on 11/02/2012 with findings of sinus rhythm with occasional PACs.  Disposition-[name] [name] appears stable. He remains in clinical remission from colon cancer. We will followup on the CEA from today. The Port-A-Cath was flushed today. He will return for the next Port-A-Cath flush in 6 weeks. He will return for a followup visit, CEA and Port-A-Cath flush in 3 months.  He will contact the office in the interim with any problems. We are administering the influenza and pneumococcal vaccines today.  Plan reviewed with Dr. Truett Perna.  Lonna Cobb ANP/GNP-BC

## 2012-11-23 NOTE — Telephone Encounter (Signed)
Gave pt appt for January and March 2014 lab and MD-

## 2012-12-08 ENCOUNTER — Other Ambulatory Visit: Payer: Self-pay | Admitting: Internal Medicine

## 2012-12-20 ENCOUNTER — Ambulatory Visit: Payer: 59 | Admitting: Internal Medicine

## 2013-01-04 ENCOUNTER — Ambulatory Visit (HOSPITAL_BASED_OUTPATIENT_CLINIC_OR_DEPARTMENT_OTHER): Payer: 59

## 2013-01-04 VITALS — BP 185/93 | HR 90 | Temp 97.7°F | Resp 20

## 2013-01-04 DIAGNOSIS — Z452 Encounter for adjustment and management of vascular access device: Secondary | ICD-10-CM

## 2013-01-04 DIAGNOSIS — C20 Malignant neoplasm of rectum: Secondary | ICD-10-CM

## 2013-01-04 MED ORDER — HEPARIN SOD (PORK) LOCK FLUSH 100 UNIT/ML IV SOLN
500.0000 [IU] | Freq: Once | INTRAVENOUS | Status: AC
  Administered 2013-01-04: 500 [IU] via INTRAVENOUS
  Filled 2013-01-04: qty 5

## 2013-01-04 MED ORDER — SODIUM CHLORIDE 0.9 % IJ SOLN
10.0000 mL | INTRAMUSCULAR | Status: DC | PRN
  Administered 2013-01-04: 10 mL via INTRAVENOUS
  Filled 2013-01-04: qty 10

## 2013-01-06 ENCOUNTER — Ambulatory Visit (INDEPENDENT_AMBULATORY_CARE_PROVIDER_SITE_OTHER): Payer: 59 | Admitting: Surgery

## 2013-01-06 ENCOUNTER — Encounter (INDEPENDENT_AMBULATORY_CARE_PROVIDER_SITE_OTHER): Payer: Self-pay | Admitting: Surgery

## 2013-01-06 VITALS — BP 140/82 | HR 72 | Temp 96.9°F | Resp 20 | Ht 70.0 in | Wt 180.0 lb

## 2013-01-06 DIAGNOSIS — C20 Malignant neoplasm of rectum: Secondary | ICD-10-CM

## 2013-01-06 NOTE — Progress Notes (Signed)
Patient:  [name] [name] MRN:  [id number] DOB:  1951-11-28   ASSESSMENT AND PLAN: 1.  Rectal Cancer.  Path -  ypT3, ypN2, 4/10 nodes positive.   KRAS mutation detected.  Completed neoadjuvant therapy by Drs. Sherrill and [place] end of Nov/beginning of Dec 2012.  He had LAR - 02/06/2012.  Ileostomy reversed 05/05/2012.  CEA - 3.0 on 11/23/2012  Doing well.  Will see me back in 6 months.  Has planned CT scan in early March.   2.  Hepatic nodules.  ? Mets?  Needle aspiration of right lobe of liver by Dr. Jacqualine Code - 12/22/2011 - Benign.   But he had firm nodules in both lobes of liver palpable at the time of surgery.   3.  Right hydroureter secondary to nephrolithiasis.     Dr. Brunilda Payor explored at surgery, but could not remove stone.  4.  Power port - 03/10/2012 - D. Leonides Minder 5.  Hypertension. 6.  Smokes.    He has cut back, but has not quit. 7.  Some neuropathy to feet.  HISTORY OF PRESENT ILLNESS: [name] [name] is a 62 y.o. (DOB: Aug 23, 1951)  AA male whose primary care physician is Carrie Mew, MD and comes to me today for follow for surgery on a rectal cancer.  He is doing well.  He says that he has gained too much weight.  He had a little diarrhea about 2 weeks ago, but that has cleared.  Some numbness of his feet.   History of rectal cancer: The patient has had some rectal bleeding for about one year.   He saw Dr. Jarold Motto who did a colonoscopy on him on 09/03/2011.  Dr. Jarold Motto saw in Mr. Schaber's rectum, there was a 5 cm villous/necrotic tumor.  Biopsy of the rectal tumor read by Dr. [place] Rund showed invasive adenocarcinoma (Case No.: [id number].)  The patient also had a CT scan the same day.  The CT scan showed: 1. Large 4 cm rectal mass. 2. Left perirectal mass is concerning for local extension of the tumor. 3. Multiple hepatic hypodensities are concerning for hepatic metastasis. 4. Chronic right hydronephrosis secondary to a obstructing calculus in the mid right ureter.  He  completed neoadjuvant therapy by Drs. Sherrill and [place] end of Nov/beginning of Dec 2012.  He had his LAR 02/06/2012 and has a ileostomy.     Past Medical History  Diagnosis Date  . Hypertension   . Abnormal EKG 11/01/11    REPORT IN EPIC-PT STATES NO KNOWN HEART PROBLEMS-NO CHEST PAINS  . 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 Low blood potassium     POTASSIUM WAS 2.1 ON 11/01/11 VISIT TO ER--PT PUT ON ORAL POTASSIUM--MOST RECENT LAB 11/24/11 POTASSIUM IMPROVED TO 3.1  . History of radiation therapy 09/29/11-11/07/11    rectal ca  . Nasal congestion   . COPD (chronic obstructive pulmonary disease)   . Heart murmur     SINCE BIRTH--DOES NOT CAUSE ANY PROBLEMS  . Dyspnea     no recent bronchitis  . Recurrent upper respiratory infection (URI)     HEAD COLD   . Sleep apnea     stop-bang score =6 (NO SLEEP STUDY DONE YET)  . No pertinent past medical history     2 SPOTS ON LIVER JUST WATCHING  . Hydronephrosis of right kidney     "Chronic" -SECONDARY TO CALCULUS (STENT RT URETER) MID RT URETER AND ADDITIONAL CALCULI WITHIN RT KIDNEY--PT STATES HE HAS PASSED A STONE IN  THE PAST--DID NOT KNOW HE HAD MORE STONES UNTIL CT ABD DONE ON 09/03/11  . Rectal cancer     HAS COMPLETED RADIATION AND XELODA--PLAN RESTAGING TESTING.  [facility]ett [facility]  . Cancer     rectal adenocarcinoma stage [id number]  . Hydroureter, right   . Nephrolithiasis    No Known Allergies  REVIEW OF SYSTEMS: Cardiac:  Hypertension x 10 years. No history of heart disease.  No history of prior cardiac catheterization.  No history of seeing a cardiologist. Pulmonary:  Smokes cigarettes - 1-1 1/2 PPD. No OSA/CPAP.  Gastrointestinal:  See HPI. Urologic:  History of kidney stones.  Sees Dr. Irving Burton. Musculoskeletal:  Bilateral shoulder pain.  Not seeing anyone for this.  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  Married.  No children. No family  history of colon cancer.  PHYSICAL EXAM: BP 140/82  Pulse 72  Temp 96.9 F (36.1 C) (Temporal)  Resp 20  Ht 5\' 10"  (1.778 m)  Wt 180 lb (81.647 kg)  BMI 25.83 kg/m2  General: WN BM.   HEENT: Normal. Pupils equal. Chest:  Port in left upper chest. Lungs:  Clear. Heart:  RRR. Abdomen: Incisions well healed.  No hernia or mass. BS present.  DATA REVIEWED: CEA - 11/23/2012 - 3.0  Ovidio Kin, M.D., [facility], [place]

## 2013-01-14 ENCOUNTER — Ambulatory Visit (INDEPENDENT_AMBULATORY_CARE_PROVIDER_SITE_OTHER): Payer: 59 | Admitting: Internal Medicine

## 2013-01-14 ENCOUNTER — Encounter: Payer: Self-pay | Admitting: Internal Medicine

## 2013-01-14 VITALS — BP 130/70 | HR 80 | Temp 98.1°F | Resp 16 | Ht 70.0 in | Wt 179.0 lb

## 2013-01-14 DIAGNOSIS — N289 Disorder of kidney and ureter, unspecified: Secondary | ICD-10-CM

## 2013-01-14 DIAGNOSIS — I10 Essential (primary) hypertension: Secondary | ICD-10-CM

## 2013-01-14 DIAGNOSIS — H6120 Impacted cerumen, unspecified ear: Secondary | ICD-10-CM

## 2013-01-14 NOTE — Progress Notes (Signed)
Subjective:    Patient ID: [name] [name], male    DOB: 18-Nov-1951, 62 y.o.   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  HENT: Negative for hearing loss, congestion, neck pain and postnasal drip.   Eyes: Negative for discharge, redness and visual disturbance.  Respiratory: Negative for cough, shortness of breath and wheezing.   Cardiovascular: Negative for leg swelling.  Gastrointestinal: Negative for abdominal pain, constipation and abdominal distention.  Genitourinary: Negative for urgency and frequency.  Musculoskeletal: Negative for joint swelling and arthralgias.  Skin: Negative for color change and rash.  Neurological: Negative for weakness and light-headedness.  Hematological: Negative for adenopathy.  Psychiatric/Behavioral: Negative for behavioral problems.   Past Medical History  Diagnosis Date  . Hypertension   . Abnormal EKG 11/01/11    REPORT IN EPIC-PT STATES NO KNOWN HEART PROBLEMS-NO CHEST PAINS  . 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 Low blood potassium     POTASSIUM WAS 2.1 ON 11/01/11 VISIT TO ER--PT PUT ON ORAL POTASSIUM--MOST RECENT LAB 11/24/11 POTASSIUM IMPROVED TO 3.1  . History of radiation therapy 09/29/11-11/07/11    rectal ca  . Nasal congestion   . COPD (chronic obstructive pulmonary disease)   . Heart murmur     SINCE BIRTH--DOES NOT CAUSE ANY PROBLEMS  . Dyspnea     no recent bronchitis  . Recurrent upper respiratory infection (URI)     HEAD COLD   . Sleep apnea     stop-bang score =6 (NO SLEEP STUDY DONE YET)  . No pertinent past medical history     2 SPOTS ON LIVER JUST WATCHING  . Hydronephrosis of right kidney     "Chronic" -SECONDARY TO CALCULUS (STENT RT  URETER) MID RT URETER AND ADDITIONAL CALCULI WITHIN RT KIDNEY--PT STATES HE HAS PASSED A STONE IN THE PAST--DID NOT KNOW HE HAD MORE STONES UNTIL CT ABD DONE ON 09/03/11  . Rectal cancer     HAS COMPLETED RADIATION AND XELODA--PLAN RESTAGING TESTING.  DR. Truett Perna [facility]  . Cancer     rectal adenocarcinoma stage [id number]  . Hydroureter, right   . Nephrolithiasis     History   Social History  . Marital Status: Married    Spouse Name: N/A    Number of Children: N/A  . Years of Education: N/A   Occupational History  . Not on file.   Social History Main Topics  . Smoking status: Current Every Day Smoker -- 1.0 packs/day for 46 years    Types: Cigarettes  . Smokeless tobacco: Never Used  . Alcohol Use: No  . Drug Use: No  . Sexually Active: Yes   Other Topics Concern  . Not on file   Social History Narrative  . No narrative on file    Past Surgical History  Procedure Date  . Wrist and hand surgery yrs ago    LEFT WRIST AND RIGHT HAND--GANGLION LW  AND FX OF RT HAS  . Right hand surgery -repair fracture yrs ago  . Removal ganglion cyst yrs ago  . Cystoscopy with stent placment jan 2013  . Appendectomy 02/06/2012    Procedure: APPENDECTOMY;  Surgeon: Kandis Cocking, MD;  Location: [facility];  Service: General;;  . Laparotomy 02/06/2012    Procedure: EXPLORATORY LAPAROTOMY;  Surgeon: Kandis Cocking, MD;  Location: [facility];  Service: General;;  . Ureterolithotomy 02/06/2012    Procedure: URETEROLITHOTOMY;  Surgeon: Lindaann Slough, MD;  Location: [facility];  Service: Urology;  Laterality: Right;  open exploration of right ureter, double J stent exchange, attempted ureteroscopy  . Colostomy   . Portacath placement 03/10/2012    Procedure: INSERTION PORT-A-CATH;  Surgeon: Kandis Cocking, MD;  Location: [facility];  Service: General;  Laterality: N/A;  . Colon surgery   . Ileo loop colostomy closure 05/05/2012    Procedure: LAPAROSCOPIC ILEO LOOP COLOSTOMY CLOSURE;   Surgeon: Kandis Cocking, MD;  Location: [facility];  Service: General;  Laterality: N/A;  Laparoscopic Assisted Ileostomy reversal    Family History  Problem Relation Age of Onset  . Lupus    . Anemia Mother   . Hypertension Father   . Kidney disease Father 73    renal failure  . Lupus Sister   . Lupus Brother     No Known Allergies  Current Outpatient Prescriptions on File Prior to Visit  Medication Sig Dispense Refill  . amLODipine (NORVASC) 10 MG tablet Take 1 tablet (10 mg total) by mouth daily.  30 tablet  2  . naproxen sodium (ANAPROX) 220 MG tablet Take 220 mg by mouth as needed.      . potassium chloride SA (K-DUR,KLOR-CON) 20 MEQ tablet Take 1 tablet (20 mEq total) by mouth 2 (two) times daily.  60 tablet  1    BP 130/70  Pulse 80  Temp 98.1 F (36.7 C)  Resp 16  Ht 5\' 10"  (1.778 m)  Wt 179 lb (81.194 kg)  BMI 25.68 kg/m2       Objective:   Physical Exam  Nursing note and vitals reviewed. Constitutional: He appears well-developed and well-nourished.  HENT:  Head: Normocephalic and atraumatic.  Eyes: Conjunctivae normal are normal. Pupils are equal, round, and reactive to light.  Neck: Normal range of motion. Neck supple.  Cardiovascular: Normal rate and regular rhythm.   Pulmonary/Chest: Effort normal and breath sounds normal.  Abdominal: Soft. Bowel sounds are normal.    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 Creat stable at 1.8 Discussed hydration CEA slight increase and followed by Heme-onc with possible liver met? Smoker!!!! Renae Gloss  To stop  Informed consent was obtained and peroxide gel was inserted into the ears bilaterally using the lavage kit the ears were lavaged until clean.Inspection with a cerumen spoon removed residual wax. Patient tolerated the procedure well.

## 2013-01-14 NOTE — Patient Instructions (Signed)
The patient is instructed to continue all medications as prescribed. Schedule followup with check out clerk upon leaving the clinic  

## 2013-02-22 ENCOUNTER — Ambulatory Visit (HOSPITAL_BASED_OUTPATIENT_CLINIC_OR_DEPARTMENT_OTHER): Payer: 59 | Admitting: Oncology

## 2013-02-22 ENCOUNTER — Other Ambulatory Visit (HOSPITAL_BASED_OUTPATIENT_CLINIC_OR_DEPARTMENT_OTHER): Payer: 59 | Admitting: Lab

## 2013-02-22 ENCOUNTER — Telehealth: Payer: Self-pay | Admitting: Oncology

## 2013-02-22 VITALS — BP 171/86 | HR 90 | Temp 98.2°F | Resp 18 | Ht 70.0 in | Wt 185.7 lb

## 2013-02-22 DIAGNOSIS — R7989 Other specified abnormal findings of blood chemistry: Secondary | ICD-10-CM

## 2013-02-22 DIAGNOSIS — C20 Malignant neoplasm of rectum: Secondary | ICD-10-CM

## 2013-02-22 DIAGNOSIS — C19 Malignant neoplasm of rectosigmoid junction: Secondary | ICD-10-CM

## 2013-02-22 DIAGNOSIS — F17200 Nicotine dependence, unspecified, uncomplicated: Secondary | ICD-10-CM

## 2013-02-22 DIAGNOSIS — Z0000 Encounter for general adult medical examination without abnormal findings: Secondary | ICD-10-CM

## 2013-02-22 LAB — BASIC METABOLIC PANEL (CC13)
BUN: 26.7 mg/dL — ABNORMAL HIGH (ref 7.0–26.0)
CO2: 23 mEq/L (ref 22–29)
Chloride: 108 mEq/L — ABNORMAL HIGH (ref 98–107)
Glucose: 109 mg/dl — ABNORMAL HIGH (ref 70–99)
Potassium: 3.2 mEq/L — ABNORMAL LOW (ref 3.5–5.1)
Sodium: 144 mEq/L (ref 136–145)

## 2013-02-22 MED ORDER — SODIUM CHLORIDE 0.9 % IJ SOLN
10.0000 mL | INTRAMUSCULAR | Status: DC | PRN
  Administered 2013-02-22: 10 mL via INTRAVENOUS
  Filled 2013-02-22: qty 10

## 2013-02-22 MED ORDER — HEPARIN SOD (PORK) LOCK FLUSH 100 UNIT/ML IV SOLN
500.0000 [IU] | Freq: Once | INTRAVENOUS | Status: AC
  Administered 2013-02-22: 500 [IU] via INTRAVENOUS
  Filled 2013-02-22: qty 5

## 2013-02-22 NOTE — Progress Notes (Signed)
[facility]    OFFICE PROGRESS NOTE   INTERVAL HISTORY:   He returns as scheduled. He continues to have mild numbness in the toes. He has occasional "sharp "discomfort at the right abdominal incision. No consistent pain. Good appetite and energy level. He is working  Objective:  Vital signs in last 24 hours:  Blood pressure 171/86, pulse 90, temperature 98.2 F (36.8 C), temperature source Oral, resp. rate 18, height 5\' 10"  (1.778 m), weight 185 lb 11.2 oz (84.233 kg).    HEENT: Neck without mass Lymphatics: No cervical, supra-clavicular, axillary, or inguinal nodes Resp: Lungs clear bilaterally Cardio: Regular rate and rhythm GI: No hepatosplenomegaly, nontender, no mass, surgical scars without evidence of recurrent tumor Vascular: No leg edema  Portacath/PICC-without erythema  Lab Results:  Chemistry panel pending   Medications: I have reviewed the patient's current medications.  Assessment/Plan: 1. Metastatic rectal cancer status post biopsy of rectal mass 09/03/2011 with pathology showing invasive adenocarcinoma. Staging CT scans 09/03/2011 showed a 10 mm hypodense rounded lesion in left lateral hepatic lobe, similar 6 mm lesion in the superior right hepatic lobe and a smaller subcapsular lesion in the lateral right hepatic lobe; rectal mass within the lumen of the bowel emanating from the left wall measuring 4.9 x 3.5 cm and a mass within the low left perirectal fat measuring 2.3 x 3.2 cm consistent with local extension of carcinoma. Staging PET scan 09/18/2011 showed a 5.1 cm distal sigmoid/rectal mass with max SUV 19.2 with local extension into the left perirectal fat with max SUV 7.7. At least 2 suspected hepatic metastases with max SUV 6.7 in the lateral segment left hepatic lobe and max SUV 4.1 in the lateral right hepatic dome. Additional tiny hypodensities in the liver may be cysts; patchy/nodular opacity in the left lower lobe with max SUV 3.5  worrisome for pulmonary metastasis, less likely infectious. He began radiation and concurrent Xeloda chemotherapy on 09/29/2011. He completed radiation on 11/06/2011. MRI of the liver on 12/08/2011 showed 2 hypermetabolic liver lesions, similar in size to 09/03/2011. 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 Equivocal 7 mm lesion in the inferior right hepatic lobe. No evidence of extrahepatic metastasis within the abdomen. An ultrasound-guided biopsy of a small left hepatic lesion on 12/23/2011 was nondiagnostic. He underwent a low anterior resection with loop ileostomy on 02/06/2012 with final pathology showing a 3.5 cm invasive adenocarcinoma with abundant extracellular mucin invading through the muscularis propria into pericolonic fatty tissue; 4 of 10 pericolonic lymph nodes were positive for metastatic carcinoma; resection margins were clear (ypT3,ypN2). He began FOLFOX chemotherapy on 03/16/2012. He completed 2 cycles of FOLFOX chemotherapy and underwent an ileostomy reversal 05/05/2012. FOLFOX was resumed on 06/14/2012 and completed on 09/14/2012. 2. CEA 13.8 on 09/17/2011; 1.6 on 12/01/2011; 8.4 on 03/09/2012, 2.1 on 05/03/2012; 2.5 on 07/26/2012; 1.8 on 10/12/2012. 3.0 on 11/23/2012. 3. K-ras mutation detected. 4. Rectal pain and bleeding secondary to #1. Resolved. 5. Frequent loose stools secondary to #1. Improved. 6. Hypertension. 7. Tobacco use. 8. CT scan 09/03/2011 with chronic right hydronephrosis secondary to an obstructing calculus in the mid right ureter. He is followed by Dr. Brunilda Payor.  9. Status post cystoscopy, right retrograde pyelogram, ureteroscopy and insertion of right double-J stent 11/28/2011. The double-J stent was replaced 02/06/2012. The stent has been removed. 10. Elevated BUN/creatinine. Likely secondary to dehydration while the ileostomy was in place. Improved. He is followed by Dr. Lowell Guitar. 11. Status post  ultrasound-guided fine-needle aspiration of small left hepatic lesion 12/22/2011. The pathology revealed no malignancy. 12. Malaise and weight loss. Resolved. 13. High output ileostomy. Partially improved with Imodium. Status post ileostomy reversal 05/05/2012. 14. Erectile dysfunction. He is followed by Dr. Brunilda Payor. 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 16. Diarrhea after FOLFOX chemotherapy given on 07/26/2012-resolved. The bolus and infusional 5-FU were dose reduced by 20% beginning with FOLFOX on 08/17/2012. He reports improvement in the diarrhea since he began taking Lomotil. 17. Irregular heart rate-he reports this is a chronic problem. Status post evaluation by Dr. Clent Ridges on 11/02/2012 with findings of sinus rhythm with occasional PACs.  Disposition:  [name] [name] remains in clinical remission from colon cancer. We will followup on the CEA from today. The Port-A-Cath was flushed today. He will return for an office visit and Port-A-Cath flush in 6 weeks. [name] [name] will be scheduled for a restaging CT evaluation within the next few weeks.   Thornton Papas, MD  02/22/2013  10:23 AM

## 2013-02-22 NOTE — Telephone Encounter (Signed)
Gave pt appt for ML only for April 2014 and gave pt oral contrast for CT

## 2013-02-23 ENCOUNTER — Telehealth: Payer: Self-pay | Admitting: *Deleted

## 2013-02-23 DIAGNOSIS — E876 Hypokalemia: Secondary | ICD-10-CM

## 2013-02-23 MED ORDER — POTASSIUM CHLORIDE CRYS ER 20 MEQ PO TBCR: 20.0000 meq | EXTENDED_RELEASE_TABLET | Freq: Every day | ORAL | Status: DC

## 2013-02-23 NOTE — Telephone Encounter (Signed)
Message copied by Wandalee Ferdinand on Wed Feb 23, 2013 10:45 AM ------      Message from: Thornton Papas B      Created: Tue Feb 22, 2013  9:29 PM       Please call patien K+ is still low, resome kcl daily, check bmet with portacath flush 6 weeks.            Be sure radiology ok. To give contrast with the elevated creatinine ------

## 2013-02-23 NOTE — Telephone Encounter (Signed)
Notified patient of his K+ results and elevation in CEA and need for CT scan. He will return call to radiology department to schedule the scan.

## 2013-02-23 NOTE — Telephone Encounter (Deleted)
Message copied by Wandalee Ferdinand on Wed Feb 23, 2013 10:51 AM ------      Message from: Thornton Papas B      Created: Tue Feb 22, 2013  9:29 PM       Please call patien K+ is still low, resome kcl daily, check bmet with portacath flush 6 weeks.            Be sure radiology ok. To give contrast with the elevated creatinine ------

## 2013-03-10 ENCOUNTER — Ambulatory Visit (HOSPITAL_COMMUNITY)
Admission: RE | Admit: 2013-03-10 | Discharge: 2013-03-10 | Disposition: A | Discharge status: Home / Self Care | Payer: 59 | Source: Ambulatory Visit | Attending: Oncology | Admitting: Oncology

## 2013-03-10 ENCOUNTER — Encounter (HOSPITAL_COMMUNITY): Payer: Self-pay

## 2013-03-10 DIAGNOSIS — R197 Diarrhea, unspecified: Secondary | ICD-10-CM | POA: Insufficient documentation

## 2013-03-10 DIAGNOSIS — C787 Secondary malignant neoplasm of liver and intrahepatic bile duct: Secondary | ICD-10-CM | POA: Insufficient documentation

## 2013-03-10 DIAGNOSIS — N201 Calculus of ureter: Secondary | ICD-10-CM | POA: Insufficient documentation

## 2013-03-10 DIAGNOSIS — N200 Calculus of kidney: Secondary | ICD-10-CM | POA: Insufficient documentation

## 2013-03-10 DIAGNOSIS — C19 Malignant neoplasm of rectosigmoid junction: Secondary | ICD-10-CM

## 2013-03-10 DIAGNOSIS — C20 Malignant neoplasm of rectum: Secondary | ICD-10-CM | POA: Insufficient documentation

## 2013-03-10 DIAGNOSIS — J438 Other emphysema: Secondary | ICD-10-CM | POA: Insufficient documentation

## 2013-03-10 DIAGNOSIS — N1330 Unspecified hydronephrosis: Secondary | ICD-10-CM | POA: Insufficient documentation

## 2013-03-10 DIAGNOSIS — Z9221 Personal history of antineoplastic chemotherapy: Secondary | ICD-10-CM | POA: Insufficient documentation

## 2013-03-10 DIAGNOSIS — Z923 Personal history of irradiation: Secondary | ICD-10-CM | POA: Insufficient documentation

## 2013-03-10 DIAGNOSIS — R918 Other nonspecific abnormal finding of lung field: Secondary | ICD-10-CM | POA: Insufficient documentation

## 2013-03-10 DIAGNOSIS — N269 Renal sclerosis, unspecified: Secondary | ICD-10-CM | POA: Insufficient documentation

## 2013-03-10 DIAGNOSIS — R05 Cough: Secondary | ICD-10-CM | POA: Insufficient documentation

## 2013-03-10 DIAGNOSIS — R059 Cough, unspecified: Secondary | ICD-10-CM | POA: Insufficient documentation

## 2013-03-10 MED ORDER — IOHEXOL 300 MG/ML  SOLN
100.0000 mL | Freq: Once | INTRAMUSCULAR | Status: AC | PRN
  Administered 2013-03-10: 100 mL via INTRAVENOUS

## 2013-03-28 ENCOUNTER — Telehealth: Payer: Self-pay | Admitting: Internal Medicine

## 2013-03-28 ENCOUNTER — Ambulatory Visit: Payer: 59 | Admitting: Internal Medicine

## 2013-03-28 MED ORDER — AMLODIPINE BESYLATE 10 MG PO TABS: 10.0000 mg | ORAL_TABLET | Freq: Every day | ORAL | Status: DC

## 2013-03-28 NOTE — Telephone Encounter (Signed)
Patient came in stating that he need a refill of his amlodipine 10mg  1poqd [facility] as this was not done at the last visit. Please assist.

## 2013-03-28 NOTE — Telephone Encounter (Signed)
This was sent in  

## 2013-04-04 ENCOUNTER — Other Ambulatory Visit: Payer: 59 | Admitting: Lab

## 2013-04-04 ENCOUNTER — Other Ambulatory Visit: Payer: Self-pay | Admitting: Nurse Practitioner

## 2013-04-04 ENCOUNTER — Ambulatory Visit (HOSPITAL_BASED_OUTPATIENT_CLINIC_OR_DEPARTMENT_OTHER): Payer: 59 | Admitting: Nurse Practitioner

## 2013-04-04 VITALS — BP 162/82 | HR 85 | Temp 98.3°F | Resp 18 | Ht 70.0 in | Wt 184.2 lb

## 2013-04-04 DIAGNOSIS — Z125 Encounter for screening for malignant neoplasm of prostate: Secondary | ICD-10-CM

## 2013-04-04 DIAGNOSIS — C20 Malignant neoplasm of rectum: Secondary | ICD-10-CM

## 2013-04-04 DIAGNOSIS — F17200 Nicotine dependence, unspecified, uncomplicated: Secondary | ICD-10-CM

## 2013-04-04 DIAGNOSIS — E860 Dehydration: Secondary | ICD-10-CM

## 2013-04-04 DIAGNOSIS — C7800 Secondary malignant neoplasm of unspecified lung: Secondary | ICD-10-CM

## 2013-04-04 DIAGNOSIS — C787 Secondary malignant neoplasm of liver and intrahepatic bile duct: Secondary | ICD-10-CM

## 2013-04-04 LAB — BASIC METABOLIC PANEL
BUN: 28 mg/dL — ABNORMAL HIGH (ref 6–23)
Chloride: 105 mEq/L (ref 96–112)
Creatinine, Ser: 1.48 mg/dL — ABNORMAL HIGH (ref 0.50–1.35)

## 2013-04-04 MED ORDER — HEPARIN SOD (PORK) LOCK FLUSH 100 UNIT/ML IV SOLN
500.0000 [IU] | Freq: Once | INTRAVENOUS | Status: AC
  Administered 2013-04-04: 500 [IU] via INTRAVENOUS
  Filled 2013-04-04: qty 5

## 2013-04-04 MED ORDER — SODIUM CHLORIDE 0.9 % IJ SOLN
10.0000 mL | INTRAMUSCULAR | Status: DC | PRN
  Administered 2013-04-04: 10 mL via INTRAVENOUS
  Filled 2013-04-04: qty 10

## 2013-04-04 NOTE — Progress Notes (Signed)
OFFICE PROGRESS NOTE  Interval history:  [name] [name] returns as scheduled. The CEA returned elevated at 25.4 on 02/22/2013. Restaging CT scans chest, abdomen and pelvis on 03/10/2013 showed a new lobulated pulmonary nodule in the superior segment of the left lower lobe measuring 2.2 x 2.7 cm and another more smoothly marginated pulmonary nodule in the medial left lower lobe measuring 1.4 x 1.8 cm. Other tiny less than 5 mm scattered pulmonary nodules were stable. At least 2 hypovascular masses were seen in the left hepatic lobe which were increased in size since the previous study. The largest in the lateral segment measured 3.4 x 4.2 cm as compared to 1.0 x 1.2 cm previously. Tiny subcentimeter hepatic cysts were again demonstrated. There was no lymphadenopathy in the abdomen or pelvis. Chronic severe right hydronephrosis and associated right parenchymal atrophy were stable in appearance with several calculi again seen in the proximal and mid right ureter with the largest measuring 8 mm at the level of the iliac crest.  He feels well. He has a good appetite. He is gaining weight. He denies pain. No cough or shortness of breath.   Objective: Blood pressure 162/82, pulse 85, temperature 98.3 F (36.8 C), temperature source Oral, resp. rate 18, height 5\' 10"  (1.778 m), weight 184 lb 3.2 oz (83.553 kg).  No thrush or ulceration. No palpable cervical, supraclavicular or axillary lymph nodes. Lungs are clear. Regular cardiac rhythm. Port-A-Cath site is without erythema. Abdomen is soft and nontender. No organomegaly. Extremities are without edema.  Lab Results: Lab Results  Component Value Date   WBC 7.8 10/12/2012   HGB 12.6* 10/12/2012   HCT 35.8* 10/12/2012   MCV 108.0* 10/12/2012   PLT 146 10/12/2012    Chemistry:    Chemistry      Component Value Date/Time   NA 144 02/22/2013 0838   NA 141 07/26/2012 1008   K 3.2* 02/22/2013 0838   K 3.7 07/26/2012 1008   CL 108* 02/22/2013 0838   CL 109  07/26/2012 1008   CO2 23 02/22/2013 0838   CO2 22 07/26/2012 1008   BUN 26.7* 02/22/2013 0838   BUN 24* 07/26/2012 1008   CREATININE 1.5* 02/22/2013 0838   CREATININE 1.44* 07/26/2012 1008      Component Value Date/Time   CALCIUM 9.0 02/22/2013 0838   CALCIUM 8.7 07/26/2012 1008   ALKPHOS 112 10/12/2012 0934   ALKPHOS 99 07/26/2012 1008   AST 16 10/12/2012 0934   AST 13 07/26/2012 1008   ALT 12 10/12/2012 0934   ALT 10 07/26/2012 1008   BILITOT 0.66 10/12/2012 0934   BILITOT 0.5 07/26/2012 1008       Studies/Results: Ct Chest W Contrast  03/10/2013  *RADIOLOGY REPORT*  Clinical Data:  Restaging for rectal carcinoma.  Status post surgery, chemotherapy, and radiation therapy.  Cough.  Diarrhea.  CT CHEST, ABDOMEN AND PELVIS WITH CONTRAST  Technique:  Multidetector CT imaging of the chest, abdomen and pelvis was performed following the standard protocol during bolus administration of intravenous contrast.  Contrast: OMNIPAQUE IOHEXOL 300 MG/ML  SOLN  Comparison:  09/03/2011  CT CHEST  Findings:  No evidence of hilar or mediastinal masses.  No adenopathy seen elsewhere within the thorax.  No evidence of pleural or pericardial effusion.  Mild pulmonary emphysema noted.  A new lobulated pulmonary nodule is seen in the superior segment of the left lower lobe measuring 2.2 x 2.7 cm.  Another more smoothly marginated pulmonary nodule is seen in the medial  left lower lobe measuring 1.4 x 1.8 cm. Other tiny less than 5 mm scattered pulmonary nodules remains stable.  No suspicious bone lesions are identified.  IMPRESSION:  1.  Two new left lower lobe nodules largest measuring 2.7 cm, highly suspicious for metastatic disease. 2.  No evidence of lymphadenopathy or pleural effusion.  CT ABDOMEN AND PELVIS  Findings:  A mass-like staples are seen in the rectum at site of previously demonstrated mass.  Soft tissue density in the presacral region has concave margins, attributable to post-treatment changes. No  lymphadenopathy identified within the abdomen or pelvis.  Scattered tiny sub-centimeter hepatic cysts again demonstrated. At least two hypovascular masses are seen in the left hepatic lobe which are increased in size since previous study.  Largest in the lateral segment now measures 3.4 x 4.2 cm on image 55 compared to 1.0 x 1.2 cm previously.  These are consistent with liver metastases.  The pancreas, adrenal glands, and spleen are normal in appearance. Bilateral nephrolithiasis is again demonstrated no masses are identified.  Chronic severe right hydronephrosis and associated renal parenchymal atrophy are stable in appearance with several calculi again seen in the proximal and mid right ureter, largest measuring 8 mm at the level of the iliac crest.  No distal right ureteral calculi or dilatation seen.  No suspicious bone lesions are identified.  IMPRESSION:  1.  Mild increase in liver metastases in the left hepatic lobe. 2.  No other sites of metastatic disease identified or pelvis. 3.  Chronic severe right hydronephrosis and right ureteral calculi measuring up to 8 mm.  Bilateral nonobstructing nephrolithiasis.   Original Report Authenticated By: Myles Rosenthal, M.D.    Ct Abdomen Pelvis W Contrast  03/10/2013  *RADIOLOGY REPORT*  Clinical Data:  Restaging for rectal carcinoma.  Status post surgery, chemotherapy, and radiation therapy.  Cough.  Diarrhea.  CT CHEST, ABDOMEN AND PELVIS WITH CONTRAST  Technique:  Multidetector CT imaging of the chest, abdomen and pelvis was performed following the standard protocol during bolus administration of intravenous contrast.  Contrast: OMNIPAQUE IOHEXOL 300 MG/ML  SOLN  Comparison:  09/03/2011  CT CHEST  Findings:  No evidence of hilar or mediastinal masses.  No adenopathy seen elsewhere within the thorax.  No evidence of pleural or pericardial effusion.  Mild pulmonary emphysema noted.  A new lobulated pulmonary nodule is seen in the superior segment of the left lower  lobe measuring 2.2 x 2.7 cm.  Another more smoothly marginated pulmonary nodule is seen in the medial left lower lobe measuring 1.4 x 1.8 cm. Other tiny less than 5 mm scattered pulmonary nodules remains stable.  No suspicious bone lesions are identified.  IMPRESSION:  1.  Two new left lower lobe nodules largest measuring 2.7 cm, highly suspicious for metastatic disease. 2.  No evidence of lymphadenopathy or pleural effusion.  CT ABDOMEN AND PELVIS  Findings:  A mass-like staples are seen in the rectum at site of previously demonstrated mass.  Soft tissue density in the presacral region has concave margins, attributable to post-treatment changes. No lymphadenopathy identified within the abdomen or pelvis.  Scattered tiny sub-centimeter hepatic cysts again demonstrated. At least two hypovascular masses are seen in the left hepatic lobe which are increased in size since previous study.  Largest in the lateral segment now measures 3.4 x 4.2 cm on image 55 compared to 1.0 x 1.2 cm previously.  These are consistent with liver metastases.  The pancreas, adrenal glands, and spleen are normal in appearance.  Bilateral nephrolithiasis is again demonstrated no masses are identified.  Chronic severe right hydronephrosis and associated renal parenchymal atrophy are stable in appearance with several calculi again seen in the proximal and mid right ureter, largest measuring 8 mm at the level of the iliac crest.  No distal right ureteral calculi or dilatation seen.  No suspicious bone lesions are identified.  IMPRESSION:  1.  Mild increase in liver metastases in the left hepatic lobe. 2.  No other sites of metastatic disease identified or pelvis. 3.  Chronic severe right hydronephrosis and right ureteral calculi measuring up to 8 mm.  Bilateral nonobstructing nephrolithiasis.   Original Report Authenticated By: Myles Rosenthal, M.D.     Medications: I have reviewed the patient's current  medications.  Assessment/Plan:  1. Metastatic rectal cancer status post biopsy of rectal mass 09/03/2011 with pathology showing invasive adenocarcinoma. Staging CT scans 09/03/2011 showed a 10 mm hypodense rounded lesion in left lateral hepatic lobe, similar 6 mm lesion in the superior right hepatic lobe and a smaller subcapsular lesion in the lateral right hepatic lobe; rectal mass within the lumen of the bowel emanating from the left wall measuring 4.9 x 3.5 cm and a mass within the low left perirectal fat measuring 2.3 x 3.2 cm consistent with local extension of carcinoma. Staging PET scan 09/18/2011 showed a 5.1 cm distal sigmoid/rectal mass with max SUV 19.2 with local extension into the left perirectal fat with max SUV 7.7. At least 2 suspected hepatic metastases with max SUV 6.7 in the lateral segment left hepatic lobe and max SUV 4.1 in the lateral right hepatic dome. Additional tiny hypodensities in the liver may be cysts; patchy/nodular opacity in the left lower lobe with max SUV 3.5 worrisome for pulmonary metastasis, less likely infectious. He began radiation and concurrent Xeloda chemotherapy on 09/29/2011. He completed radiation on 11/06/2011. MRI of the liver on 12/08/2011 showed 2 hypermetabolic liver lesions, similar in size to 09/03/2011. 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 Equivocal 7 mm lesion in the inferior right hepatic lobe. No evidence of extrahepatic metastasis within the abdomen. An ultrasound-guided biopsy of a small left hepatic lesion on 12/23/2011 was nondiagnostic. He underwent a low anterior resection with loop ileostomy on 02/06/2012 with final pathology showing a 3.5 cm invasive adenocarcinoma with abundant extracellular mucin invading through the muscularis propria into pericolonic fatty tissue; 4 of 10 pericolonic lymph nodes were positive for metastatic carcinoma; resection margins were clear (ypT3,ypN2). He  began FOLFOX chemotherapy on 03/16/2012. He completed 2 cycles of FOLFOX chemotherapy and underwent an ileostomy reversal 05/05/2012. FOLFOX was resumed on 06/14/2012 and completed on 09/14/2012. 2. CEA 13.8 on 09/17/2011; 1.6 on 12/01/2011; 8.4 on 03/09/2012, 2.1 on 05/03/2012; 2.5 on 07/26/2012; 1.8 on 10/12/2012. 3.0 on 11/23/2012. CEA elevated at 25.4 on 02/22/2013. 3. CT chest/abdomen/pelvis 03/10/2013 with 2 new left lower lobe nodules and 2 liver lesions increased in size. 4. K-ras mutation detected. 5. Rectal pain and bleeding secondary to #1. Resolved. 6. Frequent loose stools secondary to #1. Improved. 7. Hypertension. 8. Tobacco use. 9. CT scan 09/03/2011 with chronic right hydronephrosis secondary to an obstructing calculus in the mid right ureter. He is followed by Dr. Brunilda Payor.  10. Status post cystoscopy, right retrograde pyelogram, ureteroscopy and insertion of right double-J stent 11/28/2011. The double-J stent was replaced 02/06/2012. The stent has been removed. 11. Elevated BUN/creatinine. Likely secondary to dehydration while the ileostomy was in place. Improved. He is followed by Dr.  Powell. 12. Status post ultrasound-guided fine-needle aspiration of small left hepatic lesion 12/22/2011. The pathology revealed no malignancy. 13. Malaise and weight loss. Resolved. 14. High output ileostomy. Partially improved with Imodium. Status post ileostomy reversal 05/05/2012. 15. Erectile dysfunction. He is followed by Dr. Brunilda Payor. 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 17. Diarrhea after FOLFOX chemotherapy given on 07/26/2012-resolved. The bolus and infusional 5-FU were dose reduced by 20% beginning with FOLFOX on 08/17/2012. He reports improvement in the diarrhea since he began taking Lomotil. 18. Irregular heart rate-he reports this is a chronic problem. Status post evaluation by Dr. Clent Ridges on 11/02/2012 with findings of sinus rhythm with occasional  PACs.  Disposition-[name] [name] appears stable. The recent restaging CT scans show evidence of metastatic disease involving the lungs and liver. Dr. Truett Perna reviewed these findings as well as the CT scan images on the computer with [name] [name].  [name] [name] appears asymptomatic. Dr. Truett Perna discussed resuming systemic therapy with an irinotecan-based regimen. [name] [name] prefers an observation approach.  We are obtaining a followup CEA today. He will return for a followup visit, Port-A-Cath flush and CEA in 6 weeks. He will contact the office in the interim with any problems.  Patient seen with Dr. Truett Perna.  Lonna Cobb ANP/GNP-BC

## 2013-04-05 ENCOUNTER — Telehealth: Payer: Self-pay | Admitting: Oncology

## 2013-04-05 LAB — CEA: CEA: 44.7 ng/mL — ABNORMAL HIGH (ref 0.0–5.0)

## 2013-04-05 NOTE — Telephone Encounter (Signed)
LMONVM ADVISING THE PT OF HIS June APPTS.

## 2013-04-06 ENCOUNTER — Ambulatory Visit: Payer: 59 | Admitting: Nurse Practitioner

## 2013-04-12 ENCOUNTER — Telehealth: Payer: Self-pay | Admitting: *Deleted

## 2013-04-12 NOTE — Telephone Encounter (Signed)
Made him aware that PSA is normal, but CEA is higher. He also asked about his K+ level and inquires if he can stop taking it now? Has plenty on hand. Instructed him to continue K-tab unless nurse calls back. Will ask MD if he can stop med. Next appointment is 05/16/13.

## 2013-04-12 NOTE — Telephone Encounter (Signed)
Left VM on home # to call if he wishes to be informed of his lab results.

## 2013-04-12 NOTE — Telephone Encounter (Signed)
Message copied by Wandalee Ferdinand on Tue Apr 12, 2013  2:05 PM ------      Message from: Ladene Artist      Created: Tue Apr 05, 2013  8:29 PM       Please call patient, PSA is normal ------

## 2013-04-13 NOTE — Telephone Encounter (Signed)
Left VM that per Dr. Truett Perna, OK to stop K+. Will recheck at June visit.

## 2013-04-13 NOTE — Addendum Note (Signed)
Addended by: Wandalee Ferdinand on: 04/13/2013 01:23 PM   Modules accepted: Orders, Medications

## 2013-04-18 ENCOUNTER — Other Ambulatory Visit: Payer: Self-pay | Admitting: *Deleted

## 2013-04-18 ENCOUNTER — Ambulatory Visit (INDEPENDENT_AMBULATORY_CARE_PROVIDER_SITE_OTHER): Payer: 59 | Admitting: Internal Medicine

## 2013-04-18 ENCOUNTER — Encounter: Payer: Self-pay | Admitting: Internal Medicine

## 2013-04-18 VITALS — BP 162/80 | HR 80 | Temp 98.3°F | Resp 16 | Ht 70.0 in | Wt 184.0 lb

## 2013-04-18 DIAGNOSIS — C801 Malignant (primary) neoplasm, unspecified: Secondary | ICD-10-CM

## 2013-04-18 DIAGNOSIS — N1330 Unspecified hydronephrosis: Secondary | ICD-10-CM

## 2013-04-18 DIAGNOSIS — C7800 Secondary malignant neoplasm of unspecified lung: Secondary | ICD-10-CM | POA: Insufficient documentation

## 2013-04-18 DIAGNOSIS — I10 Essential (primary) hypertension: Secondary | ICD-10-CM | POA: Insufficient documentation

## 2013-04-18 MED ORDER — TERAZOSIN HCL 5 MG PO CAPS: 5.0000 mg | ORAL_CAPSULE | Freq: Every day | ORAL | Status: DC

## 2013-04-18 NOTE — Progress Notes (Signed)
Talk to your cancer doctor about the lung spots and if any treatment is needed

## 2013-04-18 NOTE — Progress Notes (Signed)
Subjective:    Patient ID: [name] [name], male    DOB: 1951/08/03, 62 y.o.   MRN: [id number]  HPI  for followup of hypertension.  He had a renal disease with right renal obstruction and hydronephrosis with a loss of about 70% function of the right kidney.  His blood pressure is elevated today on Norvasc alone and the record reveals that he did not respond well to a diuretic or an ACE inhibitor.  Given the renal disease we will pursue the use of Hytrin which also may help him with his BPH symptomatology. We reviewed the results of the CT scan of lungs and of his liver no liver masses appear to be stable however the lesions in the lung appeared slightly increased he is a smoker this could represent a second primary or metastatic rectal carcinoma he has followup with the oncologist and his  CEA is climbing   Review of Systems  Constitutional: Negative for fever and fatigue.  HENT: Negative for hearing loss, congestion, neck pain and postnasal drip.   Eyes: Negative for discharge, redness and visual disturbance.  Respiratory: Negative for cough, shortness of breath and wheezing.   Cardiovascular: Negative for leg swelling.  Gastrointestinal: Negative for abdominal pain, constipation and abdominal distention.  Genitourinary: Positive for urgency and frequency.  Musculoskeletal: Negative for joint swelling and arthralgias.  Skin: Negative for color change and rash.  Neurological: Positive for weakness. Negative for light-headedness.  Hematological: Negative for adenopathy.  Psychiatric/Behavioral: Negative for behavioral problems.   Past Medical History  Diagnosis Date  . Hypertension   . Abnormal EKG 11/01/11    REPORT IN EPIC-PT STATES NO KNOWN HEART PROBLEMS-NO CHEST PAINS  . 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 Low blood potassium     POTASSIUM WAS 2.1 ON 11/01/11 VISIT TO ER--PT PUT ON ORAL POTASSIUM--MOST RECENT LAB 11/24/11 POTASSIUM IMPROVED TO 3.1  . History of radiation  therapy 09/29/11-11/07/11    rectal ca  . Nasal congestion   . COPD (chronic obstructive pulmonary disease)   . Heart murmur     SINCE BIRTH--DOES NOT CAUSE ANY PROBLEMS  . Dyspnea     no recent bronchitis  . Recurrent upper respiratory infection (URI)     HEAD COLD   . Sleep apnea     stop-bang score =6 (NO SLEEP STUDY DONE YET)  . No pertinent past medical history     2 SPOTS ON LIVER JUST WATCHING  . Hydronephrosis of right kidney     "Chronic" -SECONDARY TO CALCULUS (STENT RT URETER) MID RT URETER AND ADDITIONAL CALCULI WITHIN RT KIDNEY--PT STATES HE HAS PASSED A STONE IN THE PAST--DID NOT KNOW HE HAD MORE STONES UNTIL CT ABD DONE ON 09/03/11  . Rectal cancer     HAS COMPLETED RADIATION AND XELODA--PLAN RESTAGING TESTING.  DR[facility] Truett Perna [facility]  . Cancer     rectal adenocarcinoma stage [id number]  . Hydroureter, right   . Nephrolithiasis     History   Social History  . Marital Status: Married    Spouse Name: N/A    Number of Children: N/A  . Years of Education: N/A   Occupational History  . Not on file.   Social History Main Topics  . Smoking status: Current Every Day Smoker -- 1.00 packs/day for 46 years    Types: Cigarettes  . Smokeless tobacco: Never Used  . Alcohol Use: No  . Drug Use: No  . Sexually Active:  Yes   Other Topics Concern  . Not on file   Social History Narrative  . No narrative on file    Past Surgical History  Procedure Laterality Date  . Wrist and hand surgery  yrs ago    LEFT WRIST AND RIGHT HAND--GANGLION LW  AND FX OF RT HAS  . Right hand surgery -repair fracture  yrs ago  . Removal ganglion cyst  yrs ago  . Cystoscopy with stent placment  jan 2013  . Appendectomy  02/06/2012    Procedure: APPENDECTOMY;  Surgeon: Kandis Cocking, MD;  Location: [facility];  Service: General;;  . Laparotomy  02/06/2012    Procedure: EXPLORATORY LAPAROTOMY;  Surgeon: Kandis Cocking, MD;  Location: [facility];  Service: General;;  .  Ureterolithotomy  02/06/2012    Procedure: URETEROLITHOTOMY;  Surgeon: Lindaann Slough, MD;  Location: [facility];  Service: Urology;  Laterality: Right;  open exploration of right ureter, double J stent exchange, attempted ureteroscopy  . Colostomy    . Portacath placement  03/10/2012    Procedure: INSERTION PORT-A-CATH;  Surgeon: Kandis Cocking, MD;  Location: [facility] OR;  Service: General;  Laterality: N/A;  . Colon surgery    . Ileo loop colostomy closure  05/05/2012    Procedure: LAPAROSCOPIC ILEO LOOP COLOSTOMY CLOSURE;  Surgeon: Kandis Cocking, MD;  Location: [facility];  Service: General;  Laterality: N/A;  Laparoscopic Assisted Ileostomy reversal  . Metastatic recatal cnacer      liver and lung    Family History  Problem Relation Age of Onset  . Lupus    . Anemia Mother   . Hypertension Father   . Kidney disease Father 33    renal failure  . Lupus Sister   . Lupus Brother     No Known Allergies  Current Outpatient Prescriptions on File Prior to Visit  Medication Sig Dispense Refill  . amLODipine (NORVASC) 10 MG tablet Take 1 tablet (10 mg total) by mouth daily.  90 tablet  3  . naproxen sodium (ANAPROX) 220 MG tablet Take 220 mg by mouth as needed.       No current facility-administered medications on file prior to visit.    BP 162/80  Pulse 80  Temp(Src) 98.3 F (36.8 C)  Resp 16  Ht 5\' 10"  (1.778 m)  Wt 184 lb (83.462 kg)  BMI 26.4 kg/m2       Objective:   Physical Exam  Nursing note and vitals reviewed. Constitutional: He appears well-developed and well-nourished.  HENT:  Head: Normocephalic and atraumatic.  Eyes: Conjunctivae are normal. Pupils are equal, round, and reactive to light.  Neck: Normal range of motion. Neck supple.  Cardiovascular: Normal rate and regular rhythm.   Pulmonary/Chest: Effort normal and breath sounds normal.  Abdominal: Soft. Bowel sounds are normal.          Assessment & Plan:    HTN  Elevated blood pressure in the seting of renal  dz Add hytrin for BPH with the norvasc Avoid ACE and diuretics for now. New enlarging lund masses. ? Bx mets form colon cancer vx primary lung ( smoker) Will defer to oncology.

## 2013-05-03 ENCOUNTER — Telehealth: Payer: Self-pay | Admitting: Oncology

## 2013-05-03 NOTE — Telephone Encounter (Signed)
Gave pt appt for Lab and MD June 2014

## 2013-05-16 ENCOUNTER — Telehealth: Payer: Self-pay | Admitting: Oncology

## 2013-05-16 ENCOUNTER — Other Ambulatory Visit (HOSPITAL_BASED_OUTPATIENT_CLINIC_OR_DEPARTMENT_OTHER): Payer: 59 | Admitting: Lab

## 2013-05-16 ENCOUNTER — Ambulatory Visit (HOSPITAL_BASED_OUTPATIENT_CLINIC_OR_DEPARTMENT_OTHER): Payer: 59 | Admitting: Oncology

## 2013-05-16 VITALS — BP 154/77 | HR 96 | Temp 98.1°F | Resp 18 | Ht 70.0 in | Wt 187.8 lb

## 2013-05-16 DIAGNOSIS — C20 Malignant neoplasm of rectum: Secondary | ICD-10-CM

## 2013-05-16 DIAGNOSIS — C801 Malignant (primary) neoplasm, unspecified: Secondary | ICD-10-CM

## 2013-05-16 DIAGNOSIS — Z95828 Presence of other vascular implants and grafts: Secondary | ICD-10-CM

## 2013-05-16 DIAGNOSIS — Z9889 Other specified postprocedural states: Secondary | ICD-10-CM

## 2013-05-16 LAB — BASIC METABOLIC PANEL (CC13)
BUN: 23.6 mg/dL (ref 7.0–26.0)
CO2: 21 mEq/L — ABNORMAL LOW (ref 22–29)
Chloride: 109 mEq/L — ABNORMAL HIGH (ref 98–107)
Creatinine: 1.7 mg/dL — ABNORMAL HIGH (ref 0.7–1.3)
Potassium: 3.1 mEq/L — ABNORMAL LOW (ref 3.5–5.1)

## 2013-05-16 MED ORDER — HEPARIN SOD (PORK) LOCK FLUSH 100 UNIT/ML IV SOLN
500.0000 [IU] | Freq: Once | INTRAVENOUS | Status: AC
  Administered 2013-05-16: 500 [IU] via INTRAVENOUS
  Filled 2013-05-16: qty 5

## 2013-05-16 MED ORDER — SODIUM CHLORIDE 0.9 % IJ SOLN
10.0000 mL | INTRAMUSCULAR | Status: DC | PRN
  Administered 2013-05-16: 10 mL via INTRAVENOUS
  Filled 2013-05-16: qty 10

## 2013-05-16 NOTE — Telephone Encounter (Signed)
gv and printed appt sched and avs for pt....gv pt barium   °

## 2013-05-16 NOTE — Progress Notes (Signed)
[facility]    OFFICE PROGRESS NOTE   INTERVAL HISTORY:   He returns as scheduled. [name] [name] feels well. He reports increased flatulence. No pain. He continues to have numbness in the feet.  Objective:  Vital signs in last 24 hours:  Blood pressure 154/77, pulse 96, temperature 98.1 F (36.7 C), temperature source Oral, resp. rate 18, height 5\' 10"  (1.778 m), weight 187 lb 12.8 oz (85.186 kg).    HEENT: Neck without mass Resp: Lungs clear bilaterally Cardio: Regular rate and rhythm with premature beats GI: No hepatomegaly , nontender, no mass Vascular: No leg edema  Portacath/PICC-without erythema  Lab Results: 03/10/2013-CEA-44.7  05/16/2013-potassium 3.1, creatinine 1.7, CEA 59.1    Medications: I have reviewed the patient's current medications.  Assessment/Plan: 1. Metastatic rectal cancer status post biopsy of rectal mass 09/03/2011 with pathology showing invasive adenocarcinoma. Staging CT scans 09/03/2011 showed a 10 mm hypodense rounded lesion in left lateral hepatic lobe, similar 6 mm lesion in the superior right hepatic lobe and a smaller subcapsular lesion in the lateral right hepatic lobe; rectal mass within the lumen of the bowel emanating from the left wall measuring 4.9 x 3.5 cm and a mass within the low left perirectal fat measuring 2.3 x 3.2 cm consistent with local extension of carcinoma. Staging PET scan 09/18/2011 showed a 5.1 cm distal sigmoid/rectal mass with max SUV 19.2 with local extension into the left perirectal fat with max SUV 7.7. At least 2 suspected hepatic metastases with max SUV 6.7 in the lateral segment left hepatic lobe and max SUV 4.1 in the lateral right hepatic dome. Additional tiny hypodensities in the liver may be cysts; patchy/nodular opacity in the left lower lobe with max SUV 3.5 worrisome for pulmonary metastasis, less likely infectious. He began radiation and concurrent Xeloda chemotherapy on 09/29/2011. He  completed radiation on 11/06/2011. MRI of the liver on 12/08/2011 showed 2 hypermetabolic liver lesions, similar in size to 09/03/2011. 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 Equivocal 7 mm lesion in the inferior right hepatic lobe. No evidence of extrahepatic metastasis within the abdomen. An ultrasound-guided biopsy of a small left hepatic lesion on 12/23/2011 was nondiagnostic. He underwent a low anterior resection with loop ileostomy on 02/06/2012 with final pathology showing a 3.5 cm invasive adenocarcinoma with abundant extracellular mucin invading through the muscularis propria into pericolonic fatty tissue; 4 of 10 pericolonic lymph nodes were positive for metastatic carcinoma; resection margins were clear (ypT3,ypN2). He began FOLFOX chemotherapy on 03/16/2012. He completed 2 cycles of FOLFOX chemotherapy and underwent an ileostomy reversal 05/05/2012. FOLFOX was resumed on 06/14/2012 and completed on 09/14/2012. 2. CEA 13.8 on 09/17/2011; 1.6 on 12/01/2011; 8.4 on 03/09/2012, 2.1 on 05/03/2012; 2.5 on 07/26/2012; 1.8 on 10/12/2012. 3.0 on 11/23/2012. CEA elevated at 25.4 on 02/22/2013, 44.7 on 04/04/2013, and 59.1 on 05/17/2013 3. CT chest/abdomen/pelvis 03/10/2013 with 2 new left lower lobe nodules and 2 liver lesions increased in size. 4. K-ras mutation detected. 5. Rectal pain and bleeding secondary to #1. Resolved. 6. Frequent loose stools secondary to #1. Improved. 7. Hypertension. 8. Tobacco use. 9. CT scan 09/03/2011 with chronic right hydronephrosis secondary to an obstructing calculus in the mid right ureter. He is followed by Dr. Brunilda Payor.  10. Status post cystoscopy, right retrograde pyelogram, ureteroscopy and insertion of right double-J stent 11/28/2011. The double-J stent was replaced 02/06/2012. The stent has been removed. 11. Elevated BUN/creatinine. Likely secondary to dehydration while the ileostomy  was in place.  Improved. He is followed by Dr. Lowell Guitar. 12. Status post ultrasound-guided fine-needle aspiration of small left hepatic lesion 12/22/2011. The pathology revealed no malignancy. 13. Malaise and weight loss. Resolved. 14. High output ileostomy. Partially improved with Imodium. Status post ileostomy reversal 05/05/2012. 15. Erectile dysfunction. He is followed by Dr. Brunilda Payor. 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 17. Diarrhea after FOLFOX chemotherapy given on 07/26/2012-resolved. The bolus and infusional 5-FU were dose reduced by 20% beginning with FOLFOX on 08/17/2012. He reports improvement in the diarrhea since he began taking Lomotil. 18. Irregular heart rate-he reports this is a chronic problem. Status post evaluation by Dr. Clent Ridges on 11/02/2012 with findings of sinus rhythm with occasional PACs.  Disposition:  [name] [name] appears well. He has metastatic rectal cancer. He would like to continue an observation approach. He will be scheduled for a repeat chemistry panel and restaging CT on 06/08/2013. The Port-A-Cath was flushed today. He will resume potassium chloride.   Thornton Papas, MD  05/16/2013  9:18 AM

## 2013-05-17 ENCOUNTER — Telehealth: Payer: Self-pay | Admitting: *Deleted

## 2013-05-17 NOTE — Addendum Note (Signed)
Addended by: Caleb Popp on: 05/17/2013 11:34 AM   Modules accepted: Medications

## 2013-05-17 NOTE — Telephone Encounter (Signed)
Call from radiology to clarify pt's radiology appt. Spoke with pt, he agrees to have lab/flush CT day prior to office visit. Order to schedulers to call pt with appts.

## 2013-05-19 ENCOUNTER — Telehealth: Payer: Self-pay | Admitting: Oncology

## 2013-05-19 NOTE — Telephone Encounter (Signed)
lked to  pt  gave him appt for lab and flush for 7/1

## 2013-06-07 ENCOUNTER — Ambulatory Visit (HOSPITAL_COMMUNITY)
Admission: RE | Admit: 2013-06-07 | Discharge: 2013-06-07 | Disposition: A | Discharge status: Home / Self Care | Payer: 59 | Source: Ambulatory Visit | Attending: Oncology | Admitting: Oncology

## 2013-06-07 ENCOUNTER — Ambulatory Visit: Payer: 59

## 2013-06-07 ENCOUNTER — Ambulatory Visit (HOSPITAL_COMMUNITY): Payer: 59

## 2013-06-07 ENCOUNTER — Other Ambulatory Visit (HOSPITAL_BASED_OUTPATIENT_CLINIC_OR_DEPARTMENT_OTHER): Payer: 59 | Admitting: Lab

## 2013-06-07 VITALS — BP 133/78 | HR 109 | Temp 98.6°F | Resp 18

## 2013-06-07 DIAGNOSIS — J984 Other disorders of lung: Secondary | ICD-10-CM | POA: Insufficient documentation

## 2013-06-07 DIAGNOSIS — I7090 Unspecified atherosclerosis: Secondary | ICD-10-CM | POA: Insufficient documentation

## 2013-06-07 DIAGNOSIS — N201 Calculus of ureter: Secondary | ICD-10-CM | POA: Insufficient documentation

## 2013-06-07 DIAGNOSIS — C20 Malignant neoplasm of rectum: Secondary | ICD-10-CM | POA: Insufficient documentation

## 2013-06-07 DIAGNOSIS — J438 Other emphysema: Secondary | ICD-10-CM | POA: Insufficient documentation

## 2013-06-07 DIAGNOSIS — Z9221 Personal history of antineoplastic chemotherapy: Secondary | ICD-10-CM | POA: Insufficient documentation

## 2013-06-07 DIAGNOSIS — Z9889 Other specified postprocedural states: Secondary | ICD-10-CM

## 2013-06-07 DIAGNOSIS — R609 Edema, unspecified: Secondary | ICD-10-CM | POA: Insufficient documentation

## 2013-06-07 DIAGNOSIS — K429 Umbilical hernia without obstruction or gangrene: Secondary | ICD-10-CM | POA: Insufficient documentation

## 2013-06-07 DIAGNOSIS — Z95828 Presence of other vascular implants and grafts: Secondary | ICD-10-CM

## 2013-06-07 DIAGNOSIS — N1330 Unspecified hydronephrosis: Secondary | ICD-10-CM | POA: Insufficient documentation

## 2013-06-07 DIAGNOSIS — Z923 Personal history of irradiation: Secondary | ICD-10-CM | POA: Insufficient documentation

## 2013-06-07 DIAGNOSIS — C787 Secondary malignant neoplasm of liver and intrahepatic bile duct: Secondary | ICD-10-CM | POA: Insufficient documentation

## 2013-06-07 LAB — BASIC METABOLIC PANEL (CC13)
BUN: 23.3 mg/dL (ref 7.0–26.0)
Creatinine: 1.6 mg/dL — ABNORMAL HIGH (ref 0.7–1.3)
Glucose: 120 mg/dl (ref 70–140)
Potassium: 3.9 mEq/L (ref 3.5–5.1)

## 2013-06-07 MED ORDER — HEPARIN SOD (PORK) LOCK FLUSH 100 UNIT/ML IV SOLN
500.0000 [IU] | Freq: Once | INTRAVENOUS | Status: AC
  Administered 2013-06-07: 500 [IU] via INTRAVENOUS
  Filled 2013-06-07: qty 5

## 2013-06-07 MED ORDER — SODIUM CHLORIDE 0.9 % IJ SOLN
10.0000 mL | INTRAMUSCULAR | Status: DC | PRN
  Administered 2013-06-07: 10 mL via INTRAVENOUS
  Filled 2013-06-07: qty 10

## 2013-06-07 MED ORDER — IOHEXOL 300 MG/ML  SOLN
100.0000 mL | Freq: Once | INTRAMUSCULAR | Status: AC | PRN
  Administered 2013-06-07: 100 mL via INTRAVENOUS

## 2013-06-07 NOTE — Patient Instructions (Signed)
Patient to [facility] CT.

## 2013-06-07 NOTE — Progress Notes (Signed)
06/07/13 Patient to flush.  Accessed with Power Loc for CT scan today

## 2013-06-08 ENCOUNTER — Ambulatory Visit (HOSPITAL_BASED_OUTPATIENT_CLINIC_OR_DEPARTMENT_OTHER): Payer: 59 | Admitting: Oncology

## 2013-06-08 ENCOUNTER — Telehealth: Payer: Self-pay | Admitting: *Deleted

## 2013-06-08 ENCOUNTER — Other Ambulatory Visit: Payer: 59 | Admitting: Lab

## 2013-06-08 ENCOUNTER — Telehealth: Payer: Self-pay | Admitting: Oncology

## 2013-06-08 VITALS — BP 150/80 | HR 94 | Temp 99.5°F | Resp 20 | Ht 70.0 in | Wt 182.3 lb

## 2013-06-08 DIAGNOSIS — C20 Malignant neoplasm of rectum: Secondary | ICD-10-CM

## 2013-06-08 DIAGNOSIS — C787 Secondary malignant neoplasm of liver and intrahepatic bile duct: Secondary | ICD-10-CM

## 2013-06-08 DIAGNOSIS — C7800 Secondary malignant neoplasm of unspecified lung: Secondary | ICD-10-CM

## 2013-06-08 NOTE — Telephone Encounter (Signed)
Gave pt appt for lab, MD , chemo and printed AVS, July and August 2014

## 2013-06-08 NOTE — Progress Notes (Signed)
[facility]    OFFICE PROGRESS NOTE   INTERVAL HISTORY:   Mr. Schweigert returns as scheduled. He reports a "cold "for the past few weeks. He had sinus congestion and has developed a cough. No measured fever. He continues to work.  Objective:  Vital signs in last 24 hours:  Blood pressure 150/80, pulse 94, temperature 99.5 F (37.5 C), temperature source Oral, resp. rate 20, height 5\' 10"  (1.778 m), weight 182 lb 4.8 oz (82.691 kg).    HEENT: no sinus tenderness. Neck without mass Lymphatics: no cervical, supraclavicular, or axillary nodes Resp: lungs clear bilaterally Cardio: regular rate and rhythm GI: no hepatomegaly, nontender, no mass Vascular: no leg edema   Portacath/PICC-without erythema  Lab Results: 06/07/2013-BUN 23.3, creatinine 1.6  05/16/2013-CEA 59.1  X-rays:CTs of the chest, abdomen, and pelvis on 06/07/2013, compared 03/10/2013-interval enlargement of a large mass in the left lower lobe and an enlarging nodule and medial aspect of the left lower lobe. Enlarged left hilar node.  Increased size of liver metastases. New lesion in the inferior aspect of segment 6.  Right renal atrophy and chronic severe right hydroureteronephrosis. Numerous nonobstructive calculi in the right renal collecting system. Obstructing stone in the right ureter.   Medications: I have reviewed the patient's current medications.  Assessment/Plan: 1. Metastatic rectal cancer status post biopsy of rectal mass 09/03/2011 with pathology showing invasive adenocarcinoma. Staging CT scans 09/03/2011 showed a 10 mm hypodense rounded lesion in left lateral hepatic lobe, similar 6 mm lesion in the superior right hepatic lobe and a smaller subcapsular lesion in the lateral right hepatic lobe; rectal mass within the lumen of the bowel emanating from the left wall measuring 4.9 x 3.5 cm and a mass within the low left perirectal fat measuring 2.3 x 3.2 cm consistent with local  extension of carcinoma. Staging PET scan 09/18/2011 showed a 5.1 cm distal sigmoid/rectal mass with max SUV 19.2 with local extension into the left perirectal fat with max SUV 7.7. At least 2 suspected hepatic metastases with max SUV 6.7 in the lateral segment left hepatic lobe and max SUV 4.1 in the lateral right hepatic dome. Additional tiny hypodensities in the liver may be cysts; patchy/nodular opacity in the left lower lobe with max SUV 3.5 worrisome for pulmonary metastasis, less likely infectious. He began radiation and concurrent Xeloda chemotherapy on 09/29/2011. He completed radiation on 11/06/2011. MRI of the liver on 12/08/2011 showed 2 hypermetabolic liver lesions, similar in size to 09/03/2011. 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 Equivocal 7 mm lesion in the inferior right hepatic lobe. No evidence of extrahepatic metastasis within the abdomen. An ultrasound-guided biopsy of a small left hepatic lesion on 12/23/2011 was nondiagnostic. He underwent a low anterior resection with loop ileostomy on 02/06/2012 with final pathology showing a 3.5 cm invasive adenocarcinoma with abundant extracellular mucin invading through the muscularis propria into pericolonic fatty tissue; 4 of 10 pericolonic lymph nodes were positive for metastatic carcinoma; resection margins were clear (ypT3,ypN2). He began FOLFOX chemotherapy on 03/16/2012. He completed 2 cycles of FOLFOX chemotherapy and underwent an ileostomy reversal 05/05/2012. FOLFOX was resumed on 06/14/2012 and completed on 09/14/2012. 2. CEA 13.8 on 09/17/2011; 1.6 on 12/01/2011; 8.4 on 03/09/2012, 2.1 on 05/03/2012; 2.5 on 07/26/2012; 1.8 on 10/12/2012. 3.0 on 11/23/2012. CEA elevated at 25.4 on 02/22/2013, 44.7 on 04/04/2013, and 59.1 on 05/17/2013 3. CT chest/abdomen/pelvis 03/10/2013 with 2 new left lower lobe nodules and 2 liver lesions  increased in size.progressive metastatic disease in  the lungs and liver on a restaging CT 06/07/2013 4. K-ras mutation detected. 5. Rectal pain and bleeding secondary to #1. Resolved. 6. Frequent loose stools secondary to #1. Improved. 7. Hypertension. 8. Tobacco use. 9. CT scan 09/03/2011 with chronic right hydronephrosis secondary to an obstructing calculus in the mid right ureter. He is followed by Dr. Brunilda Payor. Stable on the CT 06/07/2013 10. Status post cystoscopy, right retrograde pyelogram, ureteroscopy and insertion of right double-J stent 11/28/2011. The double-J stent was replaced 02/06/2012. The stent has been removed. 11. Elevated BUN/creatinine. Likely secondary to dehydration while the ileostomy was in place.he now appears to have chronic renal insufficiency. 12. Status post ultrasound-guided fine-needle aspiration of small left hepatic lesion 12/22/2011. The pathology revealed no malignancy. 13. Malaise and weight loss. Resolved. 14. High output ileostomy. Partially improved with Imodium. Status post ileostomy reversal 05/05/2012. 15. Erectile dysfunction. He is followed by Dr. Brunilda Payor. 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 17. Diarrhea after FOLFOX chemotherapy given on 07/26/2012-resolved. The bolus and infusional 5-FU were dose reduced by 20% beginning with FOLFOX on 08/17/2012. He reports improvement in the diarrhea since he began taking Lomotil. 18. History of an Irregular heart rate- Status post evaluation by Dr. Clent Ridges on 11/02/2012 with findings of sinus rhythm with occasional PACs. Disposition:  Mr. Sprung has progressive metastatic rectal cancer. I discussed the CT findings with  Him today. I recommend beginning salvage systemic therapy. He has previously been treated with FOLFOX and developed neuropathy symptoms. I recommend FOLFIRI/Avastin.  We reviewed the potential toxicities associated with the FOLFIRI regimen including the chance for nausea/vomiting, mucositis, diarrhea, and hematologic toxicity.  We discussed the abdominal pain associated with irinotecan.  We reviewed the allergic reaction, bleeding, bowel perforation, thromboembolic disease, delayed wound healing, hypertension, and proteinuria associated with Avastin.  Mr. Mcnay agrees to proceed. He was given reading materials on irinotecan and Avastin today. He would like to wait on initiating chemotherapy until 06/21/2013. He will return for an office visit and cycle 2 on 07/05/2013. The plan is to complete approximately 5 cycles of FOLFIRI/Avastin prior to a restaging CT.     Thornton Papas, MD  06/08/2013  7:14 PM

## 2013-06-08 NOTE — Telephone Encounter (Signed)
Per staff message and POF I have scheduled appts.  JMW  

## 2013-06-08 NOTE — Patient Instructions (Addendum)
Irinotecan injection What is this medicine? IRINOTECAN (ir in oh TEE kan ) is a chemotherapy drug. It is used to treat colon and rectal cancer. This medicine may be used for other purposes; ask your health care provider or pharmacist if you have questions. What should I tell my health care provider before I take this medicine? They need to know if you have any of these conditions: -blood disorders -dehydration -diarrhea -infection (especially a virus infection such as chickenpox, cold sores, or herpes) -liver disease -low blood counts, like low white cell, platelet, or red cell counts -recent or ongoing radiation therapy -an unusual or allergic reaction to irinotecan, sorbitol, other chemotherapy, other medicines, foods, dyes, or preservatives -pregnant or trying to get pregnant -breast-feeding How should I use this medicine? This drug is given as an infusion into a vein. It is administered in a hospital or clinic by a specially trained health care professional. Talk to your pediatrician regarding the use of this medicine in children. Special care may be needed. Overdosage: If you think you have taken too much of this medicine contact a poison control center or emergency room at once. NOTE: This medicine is only for you. Do not share this medicine with others. What if I miss a dose? It is important not to miss your dose. Call your doctor or health care professional if you are unable to keep an appointment. What may interact with this medicine? Do not take this medicine with any of the following medications: -atazanavir -ketoconazole -St. John's Wort This medicine may also interact with the following medications: -dexamethasone -diuretics -laxatives -medicines for seizures like carbamazepine, mephobarbital, phenobarbital, phenytoin, primidone -medicines to increase blood counts like filgrastim, pegfilgrastim, sargramostim -prochlorperazine -vaccines This list may not describe all  possible interactions. Give your health care provider a list of all the medicines, herbs, non-prescription drugs, or dietary supplements you use. Also tell them if you smoke, drink alcohol, or use illegal drugs. Some items may interact with your medicine. What should I watch for while using this medicine? Your condition will be monitored carefully while you are receiving this medicine. You will need important blood work done while you are taking this medicine. This drug may make you feel generally unwell. This is not uncommon, as chemotherapy can affect healthy cells as well as cancer cells. Report any side effects. Continue your course of treatment even though you feel ill unless your doctor tells you to stop. In some cases, you may be given additional medicines to help with side effects. Follow all directions for their use. You may get drowsy or dizzy. Do not drive, use machinery, or do anything that needs mental alertness until you know how this medicine affects you. Do not stand or sit up quickly, especially if you are an older patient. This reduces the risk of dizzy or fainting spells. Call your doctor or health care professional for advice if you get a fever, chills or sore throat, or other symptoms of a cold or flu. Do not treat yourself. This drug decreases your body's ability to fight infections. Try to avoid being around people who are sick. This medicine may increase your risk to bruise or bleed. Call your doctor or health care professional if you notice any unusual bleeding. Be careful brushing and flossing your teeth or using a toothpick because you may get an infection or bleed more easily. If you have any dental work done, tell your dentist you are receiving this medicine. Avoid taking products that contain  aspirin, acetaminophen, ibuprofen, naproxen, or ketoprofen unless instructed by your doctor. These medicines may hide a fever. Do not become pregnant while taking this medicine. Women  should inform their doctor if they wish to become pregnant or think they might be pregnant. There is a potential for serious side effects to an unborn child. Talk to your health care professional or pharmacist for more information. Do not breast-feed an infant while taking this medicine. What side effects may I notice from receiving this medicine? Side effects that you should report to your doctor or health care professional as soon as possible: -allergic reactions like skin rash, itching or hives, swelling of the face, lips, or tongue -low blood counts - this medicine may decrease the number of white blood cells, red blood cells and platelets. You may be at increased risk for infections and bleeding. -signs of infection - fever or chills, cough, sore throat, pain or difficulty passing urine -signs of decreased platelets or bleeding - bruising, pinpoint red spots on the skin, black, tarry stools, blood in the urine -signs of decreased red blood cells - unusually weak or tired, fainting spells, lightheadedness -breathing problems -chest pain -diarrhea -feeling faint or lightheaded, falls -flushing, runny nose, sweating during infusion -mouth sores or pain -pain, swelling, redness or irritation where injected -pain, swelling, warmth in the leg -pain, tingling, numbness in the hands or feet -problems with balance, talking, walking -stomach cramps, pain -trouble passing urine or change in the amount of urine -vomiting as to be unable to hold down drinks or food -yellowing of the eyes or skin Side effects that usually do not require medical attention (report to your doctor or health care professional if they continue or are bothersome): -constipation -hair loss -headache -loss of appetite -nausea, vomiting -stomach upset This list may not describe all possible side effects. Call your doctor for medical advice about side effects. You may report side effects to FDA at 1-800-FDA-1088. Where  should I keep my medicine? This drug is given in a hospital or clinic and will not be stored at home. NOTE: This sheet is a summary. It may not cover all possible information. If you have questions about this medicine, talk to your doctor, pharmacist, or health care provider.  2013, [employer] Standard. (04/11/2008 4:29:12 PM) Bevacizumab injection What is this medicine? BEVACIZUMAB (be va SIZ yoo mab) is a chemotherapy drug. It targets a protein found in many cancer cell types, and halts cancer growth. This drug treats many cancers including non-small cell lung cancer, and colon or rectal cancer. It is usually given with other chemotherapy drugs. This medicine may be used for other purposes; ask your health care provider or pharmacist if you have questions. What should I tell my health care provider before I take this medicine? They need to know if you have any of these conditions: -blood clots -heart disease, including heart failure, heart attack, or chest pain (angina) -high blood pressure -infection (especially a virus infection such as chickenpox, cold sores, or herpes) -kidney disease -lung disease -prior chemotherapy with doxorubicin, daunorubicin, epirubicin, or other anthracycline type chemotherapy agents -recent or ongoing radiation therapy -recent surgery -stroke -an unusual or allergic reaction to bevacizumab, hamster proteins, mouse proteins, other medicines, foods, dyes, or preservatives -pregnant or trying to get pregnant -breast-feeding How should I use this medicine? This medicine is for infusion into a vein. It is given by a health care professional in a hospital or clinic setting. Talk to your pediatrician regarding the use of  this medicine in children. Special care may be needed. Overdosage: If you think you have taken too much of this medicine contact a poison control center or emergency room at once. NOTE: This medicine is only for you. Do not share this medicine  with others. What if I miss a dose? It is important not to miss your dose. Call your doctor or health care professional if you are unable to keep an appointment. What may interact with this medicine? Interactions are not expected. This list may not describe all possible interactions. Give your health care provider a list of all the medicines, herbs, non-prescription drugs, or dietary supplements you use. Also tell them if you smoke, drink alcohol, or use illegal drugs. Some items may interact with your medicine. What should I watch for while using this medicine? Your condition will be monitored carefully while you are receiving this medicine. You will need important blood work and urine testing done while you are taking this medicine. During your treatment, let your health care professional know if you have any unusual symptoms, such as difficulty breathing. This medicine may rarely cause 'gastrointestinal perforation' (holes in the stomach, intestines or colon), a serious side effect requiring surgery to repair. This medicine should be started at least 28 days following major surgery and the site of the surgery should be totally healed. Check with your doctor before scheduling dental work or surgery while you are receiving this treatment. Talk to your doctor if you have recently had surgery or if you have a wound that has not healed. Do not become pregnant while taking this medicine. Women should inform their doctor if they wish to become pregnant or think they might be pregnant. There is a potential for serious side effects to an unborn child. Talk to your health care professional or pharmacist for more information. Do not breast-feed an infant while taking this medicine. This medicine has caused ovarian failure in some women. This medicine may interfere with the ability to have a child. You should talk to your doctor or health care professional if you are concerned about your fertility. What side  effects may I notice from receiving this medicine? Side effects that you should report to your doctor or health care professional as soon as possible: -allergic reactions like skin rash, itching or hives, swelling of the face, lips, or tongue -signs of infection - fever or chills, cough, sore throat, pain or trouble passing urine -signs of decreased platelets or bleeding - bruising, pinpoint red spots on the skin, black, tarry stools, nosebleeds, blood in the urine -breathing problems -changes in vision -chest pain -confusion -jaw pain, especially after dental work -mouth sores -seizures -severe abdominal pain -severe headache -sudden numbness or weakness of the face, arm or leg -swelling of legs or ankles -symptoms of a stroke: change in mental awareness, inability to talk or move one side of the body (especially in patients with lung cancer) -trouble passing urine or change in the amount of urine -trouble speaking or understanding -trouble walking, dizziness, loss of balance or coordination Side effects that usually do not require medical attention (report to your doctor or health care professional if they continue or are bothersome): -constipation -diarrhea -dry skin -headache -loss of appetite -nausea, vomiting This list may not describe all possible side effects. Call your doctor for medical advice about side effects. You may report side effects to FDA at 1-800-FDA-1088. Where should I keep my medicine? This drug is given in a hospital or clinic and  will not be stored at home. NOTE: This sheet is a summary. It may not cover all possible information. If you have questions about this medicine, talk to your doctor, pharmacist, or health care provider.  2013, [employer] Standard. (10/25/2010 4:25:37 PM)

## 2013-06-21 ENCOUNTER — Other Ambulatory Visit: Payer: Self-pay | Admitting: Oncology

## 2013-06-21 ENCOUNTER — Ambulatory Visit (HOSPITAL_BASED_OUTPATIENT_CLINIC_OR_DEPARTMENT_OTHER): Payer: 59

## 2013-06-21 ENCOUNTER — Other Ambulatory Visit (HOSPITAL_BASED_OUTPATIENT_CLINIC_OR_DEPARTMENT_OTHER): Payer: 59 | Admitting: Lab

## 2013-06-21 DIAGNOSIS — C20 Malignant neoplasm of rectum: Secondary | ICD-10-CM

## 2013-06-21 DIAGNOSIS — Z5111 Encounter for antineoplastic chemotherapy: Secondary | ICD-10-CM

## 2013-06-21 DIAGNOSIS — Z5112 Encounter for antineoplastic immunotherapy: Secondary | ICD-10-CM

## 2013-06-21 LAB — CBC WITH DIFFERENTIAL/PLATELET
Basophils Absolute: 0 10*3/uL (ref 0.0–0.1)
Eosinophils Absolute: 0 10*3/uL (ref 0.0–0.5)
HCT: 36.4 % — ABNORMAL LOW (ref 38.4–49.9)
HGB: 12.9 g/dL — ABNORMAL LOW (ref 13.0–17.1)
LYMPH%: 6.6 % — ABNORMAL LOW (ref 14.0–49.0)
MCV: 99.8 fL — ABNORMAL HIGH (ref 79.3–98.0)
MONO#: 0.5 10*3/uL (ref 0.1–0.9)
MONO%: 5.6 % (ref 0.0–14.0)
NEUT#: 7.4 10*3/uL — ABNORMAL HIGH (ref 1.5–6.5)
NEUT%: 87.1 % — ABNORMAL HIGH (ref 39.0–75.0)
Platelets: 260 10*3/uL (ref 140–400)
RBC: 3.64 10*6/uL — ABNORMAL LOW (ref 4.20–5.82)
WBC: 8.5 10*3/uL (ref 4.0–10.3)

## 2013-06-21 LAB — COMPREHENSIVE METABOLIC PANEL (CC13)
ALT: 18 U/L (ref 0–55)
CO2: 23 mEq/L (ref 22–29)
Calcium: 9.4 mg/dL (ref 8.4–10.4)
Chloride: 107 mEq/L (ref 98–109)
Creatinine: 1.7 mg/dL — ABNORMAL HIGH (ref 0.7–1.3)
Glucose: 119 mg/dl (ref 70–140)
Total Bilirubin: 0.82 mg/dL (ref 0.20–1.20)

## 2013-06-21 LAB — UA PROTEIN, DIPSTICK - CHCC: Protein, ur: NEGATIVE mg/dL

## 2013-06-21 LAB — CEA: CEA: 59.2 ng/mL — ABNORMAL HIGH (ref 0.0–5.0)

## 2013-06-21 MED ORDER — SODIUM CHLORIDE 0.9 % IJ SOLN
10.0000 mL | INTRAMUSCULAR | Status: DC | PRN
  Filled 2013-06-21: qty 10

## 2013-06-21 MED ORDER — SODIUM CHLORIDE 0.9 % IV SOLN
5.0000 mg/kg | Freq: Once | INTRAVENOUS | Status: AC
  Administered 2013-06-21: 425 mg via INTRAVENOUS
  Filled 2013-06-21: qty 17

## 2013-06-21 MED ORDER — IRINOTECAN HCL CHEMO INJECTION 100 MG/5ML
180.0000 mg/m2 | Freq: Once | INTRAVENOUS | Status: AC
  Administered 2013-06-21: 364 mg via INTRAVENOUS
  Filled 2013-06-21: qty 18.2

## 2013-06-21 MED ORDER — ATROPINE SULFATE 1 MG/ML IJ SOLN
0.5000 mg | Freq: Once | INTRAMUSCULAR | Status: AC | PRN
  Administered 2013-06-21: 0.5 mg via INTRAVENOUS

## 2013-06-21 MED ORDER — ONDANSETRON 16 MG/50ML IVPB (CHCC)
16.0000 mg | Freq: Once | INTRAVENOUS | Status: AC
  Administered 2013-06-21: 16 mg via INTRAVENOUS

## 2013-06-21 MED ORDER — SODIUM CHLORIDE 0.9 % IV SOLN
Freq: Once | INTRAVENOUS | Status: AC
  Administered 2013-06-21: 10:00:00 via INTRAVENOUS

## 2013-06-21 MED ORDER — DEXAMETHASONE SODIUM PHOSPHATE 20 MG/5ML IJ SOLN
20.0000 mg | Freq: Once | INTRAMUSCULAR | Status: AC
  Administered 2013-06-21: 20 mg via INTRAVENOUS

## 2013-06-21 MED ORDER — LEUCOVORIN CALCIUM INJECTION 350 MG
320.0000 mg/m2 | Freq: Once | INTRAVENOUS | Status: AC
  Administered 2013-06-21: 646 mg via INTRAVENOUS
  Filled 2013-06-21: qty 32.3

## 2013-06-21 MED ORDER — SODIUM CHLORIDE 0.9 % IV SOLN
1920.0000 mg/m2 | INTRAVENOUS | Status: DC
  Administered 2013-06-21: 3900 mg via INTRAVENOUS
  Filled 2013-06-21: qty 78

## 2013-06-21 MED ORDER — HEPARIN SOD (PORK) LOCK FLUSH 100 UNIT/ML IV SOLN
500.0000 [IU] | Freq: Once | INTRAVENOUS | Status: DC | PRN
  Filled 2013-06-21: qty 5

## 2013-06-21 MED ORDER — FLUOROURACIL CHEMO INJECTION 2.5 GM/50ML
320.0000 mg/m2 | Freq: Once | INTRAVENOUS | Status: AC
  Administered 2013-06-21: 650 mg via INTRAVENOUS
  Filled 2013-06-21: qty 13

## 2013-06-21 NOTE — Patient Instructions (Addendum)
Bevacizumab injection What is this medicine? BEVACIZUMAB (be va SIZ yoo mab) is a chemotherapy drug. It targets a protein found in many cancer cell types, and halts cancer growth. This drug treats many cancers including non-small cell lung cancer, and colon or rectal cancer. It is usually given with other chemotherapy drugs. This medicine may be used for other purposes; ask your health care provider or pharmacist if you have questions. What should I tell my health care provider before I take this medicine? They need to know if you have any of these conditions: -blood clots -heart disease, including heart failure, heart attack, or chest pain (angina) -high blood pressure -infection (especially a virus infection such as chickenpox, cold sores, or herpes) -kidney disease -lung disease -prior chemotherapy with doxorubicin, daunorubicin, epirubicin, or other anthracycline type chemotherapy agents -recent or ongoing radiation therapy -recent surgery -stroke -an unusual or allergic reaction to bevacizumab, hamster proteins, mouse proteins, other medicines, foods, dyes, or preservatives -pregnant or trying to get pregnant -breast-feeding How should I use this medicine? This medicine is for infusion into a vein. It is given by a health care professional in a hospital or clinic setting. Talk to your pediatrician regarding the use of this medicine in children. Special care may be needed. Overdosage: If you think you have taken too much of this medicine contact a poison control center or emergency room at once. NOTE: This medicine is only for you. Do not share this medicine with others. What if I miss a dose? It is important not to miss your dose. Call your doctor or health care professional if you are unable to keep an appointment. What may interact with this medicine? Interactions are not expected. This list may not describe all possible interactions. Give your health care provider a list of all  the medicines, herbs, non-prescription drugs, or dietary supplements you use. Also tell them if you smoke, drink alcohol, or use illegal drugs. Some items may interact with your medicine. What should I watch for while using this medicine? Your condition will be monitored carefully while you are receiving this medicine. You will need important blood work and urine testing done while you are taking this medicine. During your treatment, let your health care professional know if you have any unusual symptoms, such as difficulty breathing. This medicine may rarely cause 'gastrointestinal perforation' (holes in the stomach, intestines or colon), a serious side effect requiring surgery to repair. This medicine should be started at least 28 days following major surgery and the site of the surgery should be totally healed. Check with your doctor before scheduling dental work or surgery while you are receiving this treatment. Talk to your doctor if you have recently had surgery or if you have a wound that has not healed. Do not become pregnant while taking this medicine. Women should inform their doctor if they wish to become pregnant or think they might be pregnant. There is a potential for serious side effects to an unborn child. Talk to your health care professional or pharmacist for more information. Do not breast-feed an infant while taking this medicine. This medicine has caused ovarian failure in some women. This medicine may interfere with the ability to have a child. You should talk to your doctor or health care professional if you are concerned about your fertility. What side effects may I notice from receiving this medicine? Side effects that you should report to your doctor or health care professional as soon as possible: -allergic reactions like skin  rash, itching or hives, swelling of the face, lips, or tongue -signs of infection - fever or chills, cough, sore throat, pain or trouble passing  urine -signs of decreased platelets or bleeding - bruising, pinpoint red spots on the skin, black, tarry stools, nosebleeds, blood in the urine -breathing problems -changes in vision -chest pain -confusion -jaw pain, especially after dental work -mouth sores -seizures -severe abdominal pain -severe headache -sudden numbness or weakness of the face, arm or leg -swelling of legs or ankles -symptoms of a stroke: change in mental awareness, inability to talk or move one side of the body (especially in patients with lung cancer) -trouble passing urine or change in the amount of urine -trouble speaking or understanding -trouble walking, dizziness, loss of balance or coordination Side effects that usually do not require medical attention (report to your doctor or health care professional if they continue or are bothersome): -constipation -diarrhea -dry skin -headache -loss of appetite -nausea, vomiting This list may not describe all possible side effects. Call your doctor for medical advice about side effects. You may report side effects to FDA at 1-800-FDA-1088. Where should I keep my medicine? This drug is given in a hospital or clinic and will not be stored at home. NOTE: This sheet is a summary. It may not cover all possible information. If you have questions about this medicine, talk to your doctor, pharmacist, or health care provider.  2013, [employer]. (10/25/2010 4:25:37 PM) Irinotecan injection What is this medicine? IRINOTECAN (ir in oh TEE kan ) is a chemotherapy drug. It is used to treat colon and rectal cancer. This medicine may be used for other purposes; ask your health care provider or pharmacist if you have questions. What should I tell my health care provider before I take this medicine? They need to know if you have any of these conditions: -blood disorders -dehydration -diarrhea -infection (especially a virus infection such as chickenpox, cold sores, or  herpes) -liver disease -low blood counts, like low white cell, platelet, or red cell counts -recent or ongoing radiation therapy -an unusual or allergic reaction to irinotecan, sorbitol, other chemotherapy, other medicines, foods, dyes, or preservatives -pregnant or trying to get pregnant -breast-feeding How should I use this medicine? This drug is given as an infusion into a vein. It is administered in a hospital or clinic by a specially trained health care professional. Talk to your pediatrician regarding the use of this medicine in children. Special care may be needed. Overdosage: If you think you have taken too much of this medicine contact a poison control center or emergency room at once. NOTE: This medicine is only for you. Do not share this medicine with others. What if I miss a dose? It is important not to miss your dose. Call your doctor or health care professional if you are unable to keep an appointment. What may interact with this medicine? Do not take this medicine with any of the following medications: -atazanavir -ketoconazole -St. John's Wort This medicine may also interact with the following medications: -dexamethasone -diuretics -laxatives -medicines for seizures like carbamazepine, mephobarbital, phenobarbital, phenytoin, primidone -medicines to increase blood counts like filgrastim, pegfilgrastim, sargramostim -prochlorperazine -vaccines This list may not describe all possible interactions. Give your health care provider a list of all the medicines, herbs, non-prescription drugs, or dietary supplements you use. Also tell them if you smoke, drink alcohol, or use illegal drugs. Some items may interact with your medicine. What should I watch for while using this medicine?  Your condition will be monitored carefully while you are receiving this medicine. You will need important blood work done while you are taking this medicine. This drug may make you feel generally  unwell. This is not uncommon, as chemotherapy can affect healthy cells as well as cancer cells. Report any side effects. Continue your course of treatment even though you feel ill unless your doctor tells you to stop. In some cases, you may be given additional medicines to help with side effects. Follow all directions for their use. You may get drowsy or dizzy. Do not drive, use machinery, or do anything that needs mental alertness until you know how this medicine affects you. Do not stand or sit up quickly, especially if you are an older patient. This reduces the risk of dizzy or fainting spells. Call your doctor or health care professional for advice if you get a fever, chills or sore throat, or other symptoms of a cold or flu. Do not treat yourself. This drug decreases your body's ability to fight infections. Try to avoid being around people who are sick. This medicine may increase your risk to bruise or bleed. Call your doctor or health care professional if you notice any unusual bleeding. Be careful brushing and flossing your teeth or using a toothpick because you may get an infection or bleed more easily. If you have any dental work done, tell your dentist you are receiving this medicine. Avoid taking products that contain aspirin, acetaminophen, ibuprofen, naproxen, or ketoprofen unless instructed by your doctor. These medicines may hide a fever. Do not become pregnant while taking this medicine. Women should inform their doctor if they wish to become pregnant or think they might be pregnant. There is a potential for serious side effects to an unborn child. Talk to your health care professional or pharmacist for more information. Do not breast-feed an infant while taking this medicine. What side effects may I notice from receiving this medicine? Side effects that you should report to your doctor or health care professional as soon as possible: -allergic reactions like skin rash, itching or hives,  swelling of the face, lips, or tongue -low blood counts - this medicine may decrease the number of white blood cells, red blood cells and platelets. You may be at increased risk for infections and bleeding. -signs of infection - fever or chills, cough, sore throat, pain or difficulty passing urine -signs of decreased platelets or bleeding - bruising, pinpoint red spots on the skin, black, tarry stools, blood in the urine -signs of decreased red blood cells - unusually weak or tired, fainting spells, lightheadedness -breathing problems -chest pain -diarrhea -feeling faint or lightheaded, falls -flushing, runny nose, sweating during infusion -mouth sores or pain -pain, swelling, redness or irritation where injected -pain, swelling, warmth in the leg -pain, tingling, numbness in the hands or feet -problems with balance, talking, walking -stomach cramps, pain -trouble passing urine or change in the amount of urine -vomiting as to be unable to hold down drinks or food -yellowing of the eyes or skin Side effects that usually do not require medical attention (report to your doctor or health care professional if they continue or are bothersome): -constipation -hair loss -headache -loss of appetite -nausea, vomiting -stomach upset This list may not describe all possible side effects. Call your doctor for medical advice about side effects. You may report side effects to FDA at 1-800-FDA-1088. Where should I keep my medicine? This drug is given in a hospital or clinic and  will not be stored at home. NOTE: This sheet is a summary. It may not cover all possible information. If you have questions about this medicine, talk to your doctor, pharmacist, or health care provider.  2013, [employer]. (04/11/2008 4:29:12 PM) Fluorouracil, 5-FU injection What is this medicine? FLUOROURACIL, 5-FU (flure oh YOOR a sil) is a chemotherapy drug. It slows the growth of cancer cells. This medicine is  used to treat many types of cancer like breast cancer, colon or rectal cancer, pancreatic cancer, and stomach cancer. This medicine may be used for other purposes; ask your health care provider or pharmacist if you have questions. What should I tell my health care provider before I take this medicine? They need to know if you have any of these conditions: -blood disorders -dihydropyrimidine dehydrogenase (DPD) deficiency -infection (especially a virus infection such as chickenpox, cold sores, or herpes) -kidney disease -liver disease -malnourished, poor nutrition -recent or ongoing radiation therapy -an unusual or allergic reaction to fluorouracil, other chemotherapy, other medicines, foods, dyes, or preservatives -pregnant or trying to get pregnant -breast-feeding How should I use this medicine? This drug is given as an infusion or injection into a vein. It is administered in a hospital or clinic by a specially trained health care professional. Talk to your pediatrician regarding the use of this medicine in children. Special care may be needed. Overdosage: If you think you have taken too much of this medicine contact a poison control center or emergency room at once. NOTE: This medicine is only for you. Do not share this medicine with others. What if I miss a dose? It is important not to miss your dose. Call your doctor or health care professional if you are unable to keep an appointment. What may interact with this medicine? -allopurinol -cimetidine -dapsone -digoxin -hydroxyurea -leucovorin -levamisole -medicines for seizures like ethotoin, fosphenytoin, phenytoin -medicines to increase blood counts like filgrastim, pegfilgrastim, sargramostim -medicines that treat or prevent blood clots like warfarin, enoxaparin, and dalteparin -methotrexate -metronidazole -pyrimethamine -some other chemotherapy drugs like busulfan, cisplatin, estramustine,  vinblastine -trimethoprim -trimetrexate -vaccines Talk to your doctor or health care professional before taking any of these medicines: -acetaminophen -aspirin -ibuprofen -ketoprofen -naproxen This list may not describe all possible interactions. Give your health care provider a list of all the medicines, herbs, non-prescription drugs, or dietary supplements you use. Also tell them if you smoke, drink alcohol, or use illegal drugs. Some items may interact with your medicine. What should I watch for while using this medicine? Visit your doctor for checks on your progress. This drug may make you feel generally unwell. This is not uncommon, as chemotherapy can affect healthy cells as well as cancer cells. Report any side effects. Continue your course of treatment even though you feel ill unless your doctor tells you to stop. In some cases, you may be given additional medicines to help with side effects. Follow all directions for their use. Call your doctor or health care professional for advice if you get a fever, chills or sore throat, or other symptoms of a cold or flu. Do not treat yourself. This drug decreases your body's ability to fight infections. Try to avoid being around people who are sick. This medicine may increase your risk to bruise or bleed. Call your doctor or health care professional if you notice any unusual bleeding. Be careful brushing and flossing your teeth or using a toothpick because you may get an infection or bleed more easily. If you have any dental  work done, tell your dentist you are receiving this medicine. Avoid taking products that contain aspirin, acetaminophen, ibuprofen, naproxen, or ketoprofen unless instructed by your doctor. These medicines may hide a fever. Do not become pregnant while taking this medicine. Women should inform their doctor if they wish to become pregnant or think they might be pregnant. There is a potential for serious side effects to an unborn  child. Talk to your health care professional or pharmacist for more information. Do not breast-feed an infant while taking this medicine. Men should inform their doctor if they wish to father a child. This medicine may lower sperm counts. Do not treat diarrhea with over the counter products. Contact your doctor if you have diarrhea that lasts more than 2 days or if it is severe and watery. This medicine can make you more sensitive to the sun. Keep out of the sun. If you cannot avoid being in the sun, wear protective clothing and use sunscreen. Do not use sun lamps or tanning beds/booths. What side effects may I notice from receiving this medicine? Side effects that you should report to your doctor or health care professional as soon as possible: -allergic reactions like skin rash, itching or hives, swelling of the face, lips, or tongue -low blood counts - this medicine may decrease the number of white blood cells, red blood cells and platelets. You may be at increased risk for infections and bleeding. -signs of infection - fever or chills, cough, sore throat, pain or difficulty passing urine -signs of decreased platelets or bleeding - bruising, pinpoint red spots on the skin, black, tarry stools, blood in the urine -signs of decreased red blood cells - unusually weak or tired, fainting spells, lightheadedness -breathing problems -changes in vision -chest pain -mouth sores -nausea and vomiting -pain, swelling, redness at site where injected -pain, tingling, numbness in the hands or feet -redness, swelling, or sores on hands or feet -stomach pain -unusual bleeding Side effects that usually do not require medical attention (report to your doctor or health care professional if they continue or are bothersome): -changes in finger or toe nails -diarrhea -dry or itchy skin -hair loss -headache -loss of appetite -sensitivity of eyes to the light -stomach upset -unusually teary eyes This list  may not describe all possible side effects. Call your doctor for medical advice about side effects. You may report side effects to FDA at 1-800-FDA-1088. Where should I keep my medicine? This drug is given in a hospital or clinic and will not be stored at home. NOTE: This sheet is a summary. It may not cover all possible information. If you have questions about this medicine, talk to your doctor, pharmacist, or health care provider.  2012, [employer]. (03/29/2008 1:53:16 PM)Leucovorin injection What is this medicine? LEUCOVORIN (loo koe VOR in) is used to prevent or treat the harmful effects of some medicines. This medicine is used to treat anemia caused by a low amount of folic acid in the body. It is also used with 5-fluorouracil (5-FU) to treat colon cancer. This medicine may be used for other purposes; ask your health care provider or pharmacist if you have questions. What should I tell my health care provider before I take this medicine? They need to know if you have any of these conditions: -anemia from low levels of vitamin B-12 in the blood -an unusual or allergic reaction to leucovorin, folic acid, other medicines, foods, dyes, or preservatives -pregnant or trying to get pregnant -breast-feeding How should I  use this medicine? This medicine is for injection into a muscle or into a vein. It is given by a health care professional in a hospital or clinic setting. Talk to your pediatrician regarding the use of this medicine in children. Special care may be needed. Overdosage: If you think you have taken too much of this medicine contact a poison control center or emergency room at once. NOTE: This medicine is only for you. Do not share this medicine with others. What if I miss a dose? This does not apply. What may interact with this medicine? -capecitabine -fluorouracil -phenobarbital -phenytoin -primidone -trimethoprim-sulfamethoxazole This list may not describe all  possible interactions. Give your health care provider a list of all the medicines, herbs, non-prescription drugs, or dietary supplements you use. Also tell them if you smoke, drink alcohol, or use illegal drugs. Some items may interact with your medicine. What should I watch for while using this medicine? Your condition will be monitored carefully while you are receiving this medicine. This medicine may increase the side effects of 5-fluorouracil, 5-FU. Tell your doctor or health care professional if you have diarrhea or mouth sores that do not get better or that get worse. What side effects may I notice from receiving this medicine? Side effects that you should report to your doctor or health care professional as soon as possible: -allergic reactions like skin rash, itching or hives, swelling of the face, lips, or tongue -breathing problems -fever, infection -mouth sores -unusual bleeding or bruising -unusually weak or tired Side effects that usually do not require medical attention (report to your doctor or health care professional if they continue or are bothersome): -constipation or diarrhea -loss of appetite -nausea, vomiting This list may not describe all possible side effects. Call your doctor for medical advice about side effects. You may report side effects to FDA at 1-800-FDA-1088. Where should I keep my medicine? This drug is given in a hospital or clinic and will not be stored at home. NOTE: This sheet is a summary. It may not cover all possible information. If you have questions about this medicine, talk to your doctor, pharmacist, or health care provider.  2012, [employer]. (05/30/2008 4:50:29 PM)

## 2013-06-23 ENCOUNTER — Ambulatory Visit (HOSPITAL_BASED_OUTPATIENT_CLINIC_OR_DEPARTMENT_OTHER): Payer: 59

## 2013-06-23 ENCOUNTER — Other Ambulatory Visit: Payer: Self-pay | Admitting: Certified Registered Nurse Anesthetist

## 2013-06-23 VITALS — BP 145/90 | HR 94 | Temp 97.6°F | Resp 18

## 2013-06-23 DIAGNOSIS — Z452 Encounter for adjustment and management of vascular access device: Secondary | ICD-10-CM

## 2013-06-23 DIAGNOSIS — C20 Malignant neoplasm of rectum: Secondary | ICD-10-CM

## 2013-06-23 MED ORDER — SODIUM CHLORIDE 0.9 % IJ SOLN
10.0000 mL | INTRAMUSCULAR | Status: DC | PRN
  Administered 2013-06-23: 10 mL
  Filled 2013-06-23: qty 10

## 2013-06-23 MED ORDER — HEPARIN SOD (PORK) LOCK FLUSH 100 UNIT/ML IV SOLN
500.0000 [IU] | Freq: Once | INTRAVENOUS | Status: AC | PRN
  Administered 2013-06-23: 500 [IU]
  Filled 2013-06-23: qty 5

## 2013-06-23 NOTE — Patient Instructions (Signed)
Call MD for problems 

## 2013-06-30 ENCOUNTER — Other Ambulatory Visit: Payer: Self-pay | Admitting: Oncology

## 2013-06-30 ENCOUNTER — Other Ambulatory Visit: Payer: Self-pay | Admitting: *Deleted

## 2013-06-30 MED ORDER — DIPHENOXYLATE-ATROPINE 2.5-0.025 MG PO TABS: 1.0000 | ORAL_TABLET | Freq: Four times a day (QID) | ORAL | Status: DC | PRN

## 2013-06-30 NOTE — Telephone Encounter (Signed)
Left message on pt voice mail --script for Lomotil called in to [facility] [address] per request.

## 2013-07-05 ENCOUNTER — Other Ambulatory Visit (HOSPITAL_BASED_OUTPATIENT_CLINIC_OR_DEPARTMENT_OTHER): Payer: 59 | Admitting: Lab

## 2013-07-05 ENCOUNTER — Telehealth: Payer: Self-pay | Admitting: *Deleted

## 2013-07-05 ENCOUNTER — Ambulatory Visit (HOSPITAL_BASED_OUTPATIENT_CLINIC_OR_DEPARTMENT_OTHER): Payer: 59 | Admitting: Nurse Practitioner

## 2013-07-05 ENCOUNTER — Ambulatory Visit: Payer: 59

## 2013-07-05 ENCOUNTER — Telehealth: Payer: Self-pay | Admitting: Oncology

## 2013-07-05 VITALS — BP 135/71 | HR 103 | Temp 97.4°F | Resp 20 | Ht 70.0 in | Wt 180.1 lb

## 2013-07-05 DIAGNOSIS — C20 Malignant neoplasm of rectum: Secondary | ICD-10-CM

## 2013-07-05 LAB — CBC WITH DIFFERENTIAL/PLATELET
BASO%: 0.8 % (ref 0.0–2.0)
EOS%: 4.2 % (ref 0.0–7.0)
HCT: 30.8 % — ABNORMAL LOW (ref 38.4–49.9)
LYMPH%: 30.6 % (ref 14.0–49.0)
MCH: 34.4 pg — ABNORMAL HIGH (ref 27.2–33.4)
MCHC: 35.1 g/dL (ref 32.0–36.0)
MONO%: 8.9 % (ref 0.0–14.0)
NEUT%: 55.5 % (ref 39.0–75.0)
Platelets: 179 10*3/uL (ref 140–400)

## 2013-07-05 LAB — COMPREHENSIVE METABOLIC PANEL (CC13)
ALT: 8 U/L (ref 0–55)
AST: 12 U/L (ref 5–34)
Calcium: 8.4 mg/dL (ref 8.4–10.4)
Chloride: 111 mEq/L — ABNORMAL HIGH (ref 98–109)
Creatinine: 1.6 mg/dL — ABNORMAL HIGH (ref 0.7–1.3)
Total Bilirubin: 0.4 mg/dL (ref 0.20–1.20)

## 2013-07-05 NOTE — Telephone Encounter (Signed)
Per staff message and POF I have scheduled appts.  JMW  

## 2013-07-05 NOTE — Telephone Encounter (Signed)
gv and printed appt sched and avs for pt...emailed MW to add tx... °

## 2013-07-05 NOTE — Progress Notes (Signed)
OFFICE PROGRESS NOTE  Interval history:  [name] [name] returns as scheduled. He completed cycle 1 of FOLFIRI/Avastin on 06/21/2013. He had mild nausea on a few occasions. No vomiting. No mouth sores. He developed diarrhea beginning day 3. He tried Imodium with no improvement. The diarrhea improved with Lomotil and was doing better until last night. He had multiple loose stools over night and this morning. He does not feel ready to proceed with cycle 2 today.  He denies bleeding. No shortness of breath or chest pain. No leg swelling or calf pain.   Objective: Blood pressure 135/71, pulse 103, temperature 97.4 F (36.3 C), temperature source Oral, resp. rate 20, height 5\' 10"  (1.778 m), weight 180 lb 1.6 oz (81.693 kg).  No thrush. Lungs are clear. Regular cardiac rhythm. Port-A-Cath site without erythema. Abdomen soft. No hepatomegaly. Nondistended. Nontender. Extremities are without edema.  Lab Results: Lab Results  Component Value Date   WBC 2.7* 07/05/2013   HGB 10.8* 07/05/2013   HCT 30.8* 07/05/2013   MCV 97.9 07/05/2013   PLT 179 07/05/2013    Chemistry:    Chemistry      Component Value Date/Time   NA 145 07/05/2013 1019   NA 138 04/04/2013 1650   K 3.3* 07/05/2013 1019   K 4.0 04/04/2013 1650   CL 109* 05/16/2013 0829   CL 105 04/04/2013 1650   CO2 21* 07/05/2013 1019   CO2 24 04/04/2013 1650   BUN 21.5 07/05/2013 1019   BUN 28* 04/04/2013 1650   CREATININE 1.6* 07/05/2013 1019   CREATININE 1.48* 04/04/2013 1650      Component Value Date/Time   CALCIUM 8.4 07/05/2013 1019   CALCIUM 9.6 04/04/2013 1650   ALKPHOS 137 07/05/2013 1019   ALKPHOS 99 07/26/2012 1008   AST 12 07/05/2013 1019   AST 13 07/26/2012 1008   ALT 8 07/05/2013 1019   ALT 10 07/26/2012 1008   BILITOT 0.40 07/05/2013 1019   BILITOT 0.5 07/26/2012 1008       Studies/Results: Ct Chest W Contrast  06/07/2013   *RADIOLOGY REPORT*  Clinical Data:  History of metastatic rectal cancer.  Chemotherapy and radiation therapy  complete.  Cough.  CT CHEST, ABDOMEN AND PELVIS WITH CONTRAST  Technique:  Multidetector CT imaging of the chest, abdomen and pelvis was performed following the standard protocol during bolus administration of intravenous contrast.  Contrast: OMNIPAQUE IOHEXOL 300 MG/ML  SOLN  Comparison:  CT of the chest abdomen and pelvis 03/10/2013.  CT CHEST  Findings:  Mediastinum: Left subclavian single lumen Port-A-Cath with tip terminating at the superior cavoatrial junction. Heart size is normal. There is no significant pericardial fluid, thickening or pericardial calcification. No pathologically enlarged mediastinum or right hilar lymph nodes. There is a prominent 8 x 14 mm left hilar lymph node which although fails to meet CT criteria for enlargement is clearly larger than the prior examination and is likely malignant as it is adjacent to the left lower lobe mass. Esophagus is unremarkable in appearance.  Lungs/Pleura: Previously noted left lower lobe lesions have both increased in size.  Specifically, the nodule in the medial aspect of the left lower lobe currently measures 2.4 x 1.7 cm (previously 1.4 x 1.8 cm) and makes a broad contact with the pleura (image 53 of series 5).  There has also been significant enlargement what is now a mass in the central aspect of the superior segment of the left lower lobe which currently measures 3.5 x 2.5 cm and is  highly spiculated with irregular nodular margins and multiple tiny satellite nodules.  This is an direct contact with the left hilum where there is a borderline enlarged but highly suspicious left hilar lymph node (discussed above).  There is a background of mild centrilobular emphysema.  A few other scattered tiny 2-4 mm pulmonary nodules are seen in the lungs bilaterally, but is similar to the prior examination.  No acute consolidative air space disease.  No pleural effusions.  Musculoskeletal: There are no aggressive appearing lytic or blastic lesions noted in the  visualized portions of the skeleton.  IMPRESSION:  1.  Interval enlargement of a large mass in the superior segment of the left lower lobe (surrounded by multiple tiny satellite nodules), as well as a enlarging nodule in the medial aspect of the left lower lobe, with enlarging left hilar lymph nodes, all compatible with progression of metastatic disease.  2.  Mild centrilobular emphysema.  CT ABDOMEN AND PELVIS  Findings:  Abdomen/Pelvis: Again noted are multiple liver lesions.  Several of these are well-defined and uniformly low attenuation, likely to represent small cysts.  However, others are more ill-defined and intermediate in attenuation, compatible with metastatic lesions. The largest metastasis in segments 2 and 3 of the liver has significantly increased in size (image 56 of series 2), currently measuring 5.9 x 3.9 cm (previously 4.2 x 3.4 cm).  Another prominent lesion in segment 8 of the liver (image 15 series 2) has also substantially increased in size, currently measuring 3.6 x 2.5 cm (previously approximately 1.9 x 1.2 cm).  There also appears to be at least one new metastatic lesion in the liver measuring 1.9 x 1.4 cm in the inferior aspect of segment 6 (image 71 of series 2).  The appearance of the gallbladder, pancreas and spleen is unremarkable.  Mild nodularity is again noted throughout both adrenal glands, without significant change or dominant nodule. Several subcentimeter low attenuation lesions in the left kidney remain too small to definitively characterize, but are similar to the prior study and therefore favored to represent small cysts. Severe right renal atrophy and chronic severe right-sided hydroureteronephrosis is redemonstrated.  There are numerous nonobstructive calculi within the right renal collecting system. In addition, there are numerous stones layering dependently within the dilated proximal third of the right ureter.  In the middle third of the right ureter there is an  obstructing stone measuring 6 mm (image 93 of series 2), similar to the prior study.  No bladder calculi are noted at this time.  Small umbilical hernia containing some omental fat incidentally noted.  Presacral edema and fluid around the distal rectum, similar to the prior examination.  Postoperative changes are noted in the distal rectum related to prior resection.  No pneumoperitoneum.  No pathologic distension of small bowel.  No definite pathologic lymphadenopathy is confidently identified within the abdomen or pelvis on today's examination.  Extensive atherosclerosis throughout the abdominal and pelvic vasculature without evidence of aneurysm or dissection.  Extensive prostate gland calcifications (nonspecific).  Musculoskeletal: There are no aggressive appearing lytic or blastic lesions noted in the visualized portions of the skeleton.  Well circumscribed low attenuation lesion in the subcutaneous fat of the lower right posterior flank is unchanged compared to prior examination, favored to represent a sebaceous cyst.  IMPRESSION:  1.  Progression of metastatic disease to the liver with increased size and number of hepatic metastases, as detailed above.  2.  Postoperative changes and post procedural changes around the rectum including extensive  presacral edema and perirectal fluid, similar to prior examinations, as above. 3. Nephrolithiasis including a chronic obstructive stone in the middle third of the right ureter measuring 6 mm with a severe right- sided hydroureteronephrosis and advanced right renal atrophy. 4.  Extensive atherosclerosis. 5.  Additional incidental findings, as above, similar to prior examinations.   Original Report Authenticated By: Trudie Reed, M.D.   Ct Abdomen Pelvis W Contrast  06/07/2013   *RADIOLOGY REPORT*  Clinical Data:  History of metastatic rectal cancer.  Chemotherapy and radiation therapy complete.  Cough.  CT CHEST, ABDOMEN AND PELVIS WITH CONTRAST  Technique:   Multidetector CT imaging of the chest, abdomen and pelvis was performed following the standard protocol during bolus administration of intravenous contrast.  Contrast: OMNIPAQUE IOHEXOL 300 MG/ML  SOLN  Comparison:  CT of the chest abdomen and pelvis 03/10/2013.  CT CHEST  Findings:  Mediastinum: Left subclavian single lumen Port-A-Cath with tip terminating at the superior cavoatrial junction. Heart size is normal. There is no significant pericardial fluid, thickening or pericardial calcification. No pathologically enlarged mediastinum or right hilar lymph nodes. There is a prominent 8 x 14 mm left hilar lymph node which although fails to meet CT criteria for enlargement is clearly larger than the prior examination and is likely malignant as it is adjacent to the left lower lobe mass. Esophagus is unremarkable in appearance.  Lungs/Pleura: Previously noted left lower lobe lesions have both increased in size.  Specifically, the nodule in the medial aspect of the left lower lobe currently measures 2.4 x 1.7 cm (previously 1.4 x 1.8 cm) and makes a broad contact with the pleura (image 53 of series 5).  There has also been significant enlargement what is now a mass in the central aspect of the superior segment of the left lower lobe which currently measures 3.5 x 2.5 cm and is highly spiculated with irregular nodular margins and multiple tiny satellite nodules.  This is an direct contact with the left hilum where there is a borderline enlarged but highly suspicious left hilar lymph node (discussed above).  There is a background of mild centrilobular emphysema.  A few other scattered tiny 2-4 mm pulmonary nodules are seen in the lungs bilaterally, but is similar to the prior examination.  No acute consolidative air space disease.  No pleural effusions.  Musculoskeletal: There are no aggressive appearing lytic or blastic lesions noted in the visualized portions of the skeleton.  IMPRESSION:  1.  Interval enlargement  of a large mass in the superior segment of the left lower lobe (surrounded by multiple tiny satellite nodules), as well as a enlarging nodule in the medial aspect of the left lower lobe, with enlarging left hilar lymph nodes, all compatible with progression of metastatic disease.  2.  Mild centrilobular emphysema.  CT ABDOMEN AND PELVIS  Findings:  Abdomen/Pelvis: Again noted are multiple liver lesions.  Several of these are well-defined and uniformly low attenuation, likely to represent small cysts.  However, others are more ill-defined and intermediate in attenuation, compatible with metastatic lesions. The largest metastasis in segments 2 and 3 of the liver has significantly increased in size (image 56 of series 2), currently measuring 5.9 x 3.9 cm (previously 4.2 x 3.4 cm).  Another prominent lesion in segment 8 of the liver (image 15 series 2) has also substantially increased in size, currently measuring 3.6 x 2.5 cm (previously approximately 1.9 x 1.2 cm).  There also appears to be at least one new metastatic lesion  in the liver measuring 1.9 x 1.4 cm in the inferior aspect of segment 6 (image 71 of series 2).  The appearance of the gallbladder, pancreas and spleen is unremarkable.  Mild nodularity is again noted throughout both adrenal glands, without significant change or dominant nodule. Several subcentimeter low attenuation lesions in the left kidney remain too small to definitively characterize, but are similar to the prior study and therefore favored to represent small cysts. Severe right renal atrophy and chronic severe right-sided hydroureteronephrosis is redemonstrated.  There are numerous nonobstructive calculi within the right renal collecting system. In addition, there are numerous stones layering dependently within the dilated proximal third of the right ureter.  In the middle third of the right ureter there is an obstructing stone measuring 6 mm (image 93 of series 2), similar to the prior study.   No bladder calculi are noted at this time.  Small umbilical hernia containing some omental fat incidentally noted.  Presacral edema and fluid around the distal rectum, similar to the prior examination.  Postoperative changes are noted in the distal rectum related to prior resection.  No pneumoperitoneum.  No pathologic distension of small bowel.  No definite pathologic lymphadenopathy is confidently identified within the abdomen or pelvis on today's examination.  Extensive atherosclerosis throughout the abdominal and pelvic vasculature without evidence of aneurysm or dissection.  Extensive prostate gland calcifications (nonspecific).  Musculoskeletal: There are no aggressive appearing lytic or blastic lesions noted in the visualized portions of the skeleton.  Well circumscribed low attenuation lesion in the subcutaneous fat of the lower right posterior flank is unchanged compared to prior examination, favored to represent a sebaceous cyst.  IMPRESSION:  1.  Progression of metastatic disease to the liver with increased size and number of hepatic metastases, as detailed above.  2.  Postoperative changes and post procedural changes around the rectum including extensive presacral edema and perirectal fluid, similar to prior examinations, as above. 3. Nephrolithiasis including a chronic obstructive stone in the middle third of the right ureter measuring 6 mm with a severe right- sided hydroureteronephrosis and advanced right renal atrophy. 4.  Extensive atherosclerosis. 5.  Additional incidental findings, as above, similar to prior examinations.   Original Report Authenticated By: Trudie Reed, M.D.    Medications: I have reviewed the patient's current medications.  Assessment/Plan:  1. Metastatic rectal cancer status post biopsy of rectal mass 09/03/2011 with pathology showing invasive adenocarcinoma. Staging CT scans 09/03/2011 showed a 10 mm hypodense rounded lesion in left lateral hepatic lobe, similar 6  mm lesion in the superior right hepatic lobe and a smaller subcapsular lesion in the lateral right hepatic lobe; rectal mass within the lumen of the bowel emanating from the left wall measuring 4.9 x 3.5 cm and a mass within the low left perirectal fat measuring 2.3 x 3.2 cm consistent with local extension of carcinoma. Staging PET scan 09/18/2011 showed a 5.1 cm distal sigmoid/rectal mass with max SUV 19.2 with local extension into the left perirectal fat with max SUV 7.7. At least 2 suspected hepatic metastases with max SUV 6.7 in the lateral segment left hepatic lobe and max SUV 4.1 in the lateral right hepatic dome. Additional tiny hypodensities in the liver may be cysts; patchy/nodular opacity in the left lower lobe with max SUV 3.5 worrisome for pulmonary metastasis, less likely infectious. He began radiation and concurrent Xeloda chemotherapy on 09/29/2011. He completed radiation on 11/06/2011. MRI of the liver on 12/08/2011 showed 2 hypermetabolic liver lesions, similar in size  to 09/03/2011. 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 Equivocal 7 mm lesion in the inferior right hepatic lobe. No evidence of extrahepatic metastasis within the abdomen. An ultrasound-guided biopsy of a small left hepatic lesion on 12/23/2011 was nondiagnostic. He underwent a low anterior resection with loop ileostomy on 02/06/2012 with final pathology showing a 3.5 cm invasive adenocarcinoma with abundant extracellular mucin invading through the muscularis propria into pericolonic fatty tissue; 4 of 10 pericolonic lymph nodes were positive for metastatic carcinoma; resection margins were clear (ypT3,ypN2). He began FOLFOX chemotherapy on 03/16/2012. He completed 2 cycles of FOLFOX chemotherapy and underwent an ileostomy reversal 05/05/2012. FOLFOX was resumed on 06/14/2012 and completed on 09/14/2012. 2. CEA 13.8 on 09/17/2011; 1.6 on 12/01/2011; 8.4 on 03/09/2012, 2.1  on 05/03/2012; 2.5 on 07/26/2012; 1.8 on 10/12/2012. 3.0 on 11/23/2012. CEA elevated at 25.4 on 02/22/2013, 44.7 on 04/04/2013, and 59.1 on 05/17/2013. 3. CT chest/abdomen/pelvis 03/10/2013 with 2 new left lower lobe nodules and 2 liver lesions increased in size. Progressive metastatic disease in the lungs and liver on a restaging CT 06/07/2013. He began treatment with FOLFIRI/Avastin on 06/21/2013. 4. K-ras mutation detected. 5. Rectal pain and bleeding secondary to #1. Resolved. 6. Frequent loose stools secondary to #1. Improved. 7. Hypertension. 8. Tobacco use. 9. CT scan 09/03/2011 with chronic right hydronephrosis secondary to an obstructing calculus in the mid right ureter. He is followed by Dr. Brunilda Payor. Stable on the CT 06/07/2013 10. Status post cystoscopy, right retrograde pyelogram, ureteroscopy and insertion of right double-J stent 11/28/2011. The double-J stent was replaced 02/06/2012. The stent has been removed. 11. Elevated BUN/creatinine. Likely secondary to dehydration while the ileostomy was in place. He now appears to have chronic renal insufficiency. 12. Status post ultrasound-guided fine-needle aspiration of small left hepatic lesion 12/22/2011. The pathology revealed no malignancy. 13. Malaise and weight loss. Resolved. 14. High output ileostomy. Partially improved with Imodium. Status post ileostomy reversal 05/05/2012. 15. Erectile dysfunction. He is followed by Dr. Brunilda Payor. 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 17. Diarrhea after FOLFOX chemotherapy given on 07/26/2012-resolved. The bolus and infusional 5-FU were dose reduced by 20% beginning with FOLFOX on 08/17/2012. He reports improvement in the diarrhea since he began taking Lomotil. 18. History of an Irregular heart rate- Status post evaluation by Dr. Clent Ridges on 11/02/2012 with findings of sinus rhythm with occasional PACs. 19. Diarrhea following cycle one FOLFIRI/Avastin. 20. Hypokalemia on labs  today. He will increase Kdur to 20 mEq twice daily for 2 days and then resume once daily.  Disposition-Mr. Cowles completed cycle one FOLFIRI/Avastin on 06/21/2013. He developed diarrhea following treatment. The diarrhea initially improved with Lomotil but recurred again last night.  We will hold today's treatment and reschedule for one week. He will increase Lomotil to 2-4 times a day. He will contact the office if this is not effective.  We will see him prior to treatment in one week.  Lonna Cobb ANP/GNP-BC

## 2013-07-10 ENCOUNTER — Other Ambulatory Visit: Payer: Self-pay | Admitting: Oncology

## 2013-07-12 ENCOUNTER — Ambulatory Visit (HOSPITAL_BASED_OUTPATIENT_CLINIC_OR_DEPARTMENT_OTHER): Payer: 59

## 2013-07-12 ENCOUNTER — Telehealth: Payer: Self-pay | Admitting: *Deleted

## 2013-07-12 ENCOUNTER — Other Ambulatory Visit (HOSPITAL_BASED_OUTPATIENT_CLINIC_OR_DEPARTMENT_OTHER): Payer: 59 | Admitting: Lab

## 2013-07-12 ENCOUNTER — Ambulatory Visit (HOSPITAL_BASED_OUTPATIENT_CLINIC_OR_DEPARTMENT_OTHER): Payer: 59 | Admitting: Nurse Practitioner

## 2013-07-12 VITALS — BP 126/79 | HR 102 | Temp 97.9°F | Resp 18 | Ht 70.0 in | Wt 178.9 lb

## 2013-07-12 DIAGNOSIS — C20 Malignant neoplasm of rectum: Secondary | ICD-10-CM

## 2013-07-12 DIAGNOSIS — C7800 Secondary malignant neoplasm of unspecified lung: Secondary | ICD-10-CM

## 2013-07-12 DIAGNOSIS — F17200 Nicotine dependence, unspecified, uncomplicated: Secondary | ICD-10-CM

## 2013-07-12 DIAGNOSIS — Z5112 Encounter for antineoplastic immunotherapy: Secondary | ICD-10-CM

## 2013-07-12 DIAGNOSIS — Z5111 Encounter for antineoplastic chemotherapy: Secondary | ICD-10-CM

## 2013-07-12 DIAGNOSIS — E876 Hypokalemia: Secondary | ICD-10-CM

## 2013-07-12 DIAGNOSIS — C787 Secondary malignant neoplasm of liver and intrahepatic bile duct: Secondary | ICD-10-CM

## 2013-07-12 LAB — CBC WITH DIFFERENTIAL/PLATELET
BASO%: 0.5 % (ref 0.0–2.0)
EOS%: 1.2 % (ref 0.0–7.0)
MCH: 34.3 pg — ABNORMAL HIGH (ref 27.2–33.4)
MCHC: 34.5 g/dL (ref 32.0–36.0)
RDW: 15.3 % — ABNORMAL HIGH (ref 11.0–14.6)
lymph#: 0.9 10*3/uL (ref 0.9–3.3)

## 2013-07-12 LAB — COMPREHENSIVE METABOLIC PANEL (CC13)
ALT: 11 U/L (ref 0–55)
AST: 14 U/L (ref 5–34)
Albumin: 3.3 g/dL — ABNORMAL LOW (ref 3.5–5.0)
Alkaline Phosphatase: 154 U/L — ABNORMAL HIGH (ref 40–150)
Calcium: 9.1 mg/dL (ref 8.4–10.4)
Chloride: 110 mEq/L — ABNORMAL HIGH (ref 98–109)
Creatinine: 1.7 mg/dL — ABNORMAL HIGH (ref 0.7–1.3)
Potassium: 3.8 mEq/L (ref 3.5–5.1)

## 2013-07-12 MED ORDER — SODIUM CHLORIDE 0.9 % IV SOLN
1920.0000 mg/m2 | INTRAVENOUS | Status: DC
  Administered 2013-07-12: 3900 mg via INTRAVENOUS
  Filled 2013-07-12: qty 78

## 2013-07-12 MED ORDER — FLUOROURACIL CHEMO INJECTION 2.5 GM/50ML
320.0000 mg/m2 | Freq: Once | INTRAVENOUS | Status: AC
  Administered 2013-07-12: 650 mg via INTRAVENOUS
  Filled 2013-07-12: qty 13

## 2013-07-12 MED ORDER — ONDANSETRON 16 MG/50ML IVPB (CHCC)
16.0000 mg | Freq: Once | INTRAVENOUS | Status: AC
  Administered 2013-07-12: 16 mg via INTRAVENOUS

## 2013-07-12 MED ORDER — SODIUM CHLORIDE 0.9 % IV SOLN
Freq: Once | INTRAVENOUS | Status: AC
  Administered 2013-07-12: 13:00:00 via INTRAVENOUS

## 2013-07-12 MED ORDER — SODIUM CHLORIDE 0.9 % IV SOLN
5.0000 mg/kg | Freq: Once | INTRAVENOUS | Status: AC
  Administered 2013-07-12: 425 mg via INTRAVENOUS
  Filled 2013-07-12: qty 17

## 2013-07-12 MED ORDER — SODIUM CHLORIDE 0.9 % IJ SOLN
10.0000 mL | INTRAMUSCULAR | Status: DC | PRN
  Filled 2013-07-12: qty 10

## 2013-07-12 MED ORDER — HEPARIN SOD (PORK) LOCK FLUSH 100 UNIT/ML IV SOLN
500.0000 [IU] | Freq: Once | INTRAVENOUS | Status: DC | PRN
  Filled 2013-07-12: qty 5

## 2013-07-12 MED ORDER — ATROPINE SULFATE 1 MG/ML IJ SOLN
0.5000 mg | Freq: Once | INTRAMUSCULAR | Status: AC | PRN
  Administered 2013-07-12: 0.5 mg via INTRAVENOUS

## 2013-07-12 MED ORDER — IRINOTECAN HCL CHEMO INJECTION 100 MG/5ML
144.0000 mg/m2 | Freq: Once | INTRAVENOUS | Status: AC
  Administered 2013-07-12: 290 mg via INTRAVENOUS
  Filled 2013-07-12: qty 14.5

## 2013-07-12 MED ORDER — DEXAMETHASONE SODIUM PHOSPHATE 20 MG/5ML IJ SOLN
20.0000 mg | Freq: Once | INTRAMUSCULAR | Status: AC
  Administered 2013-07-12: 20 mg via INTRAVENOUS

## 2013-07-12 MED ORDER — LEUCOVORIN CALCIUM INJECTION 350 MG
320.0000 mg/m2 | Freq: Once | INTRAVENOUS | Status: AC
  Administered 2013-07-12: 646 mg via INTRAVENOUS
  Filled 2013-07-12: qty 32.3

## 2013-07-12 NOTE — Patient Instructions (Signed)
[facility] Discharge Instructions for Patients Receiving Chemotherapy  Today you received the following chemotherapy agents FOLFIRI, Avastin  To help prevent nausea and vomiting after your treatment, we encourage you to take your nausea medication as directed   If you develop nausea and vomiting that is not controlled by your nausea medication, call the clinic.   BELOW ARE SYMPTOMS THAT SHOULD BE REPORTED IMMEDIATELY:  *FEVER GREATER THAN 100.5 F  *CHILLS WITH OR WITHOUT FEVER  NAUSEA AND VOMITING THAT IS NOT CONTROLLED WITH YOUR NAUSEA MEDICATION  *UNUSUAL SHORTNESS OF BREATH  *UNUSUAL BRUISING OR BLEEDING  TENDERNESS IN MOUTH AND THROAT WITH OR WITHOUT PRESENCE OF ULCERS  *URINARY PROBLEMS  *BOWEL PROBLEMS  UNUSUAL RASH Items with * indicate a potential emergency and should be followed up as soon as possible.  Feel free to call the clinic you have any questions or concerns. The clinic phone number is [phone].

## 2013-07-12 NOTE — Progress Notes (Signed)
OFFICE PROGRESS NOTE  Interval history:  [name] [name] returns as scheduled. Cycle 2 FOLFIRI/Avastin was held on 07/05/2013 due to diarrhea.  The diarrhea has resolved. He denies nausea/vomiting. Appetite is better. He denies any bleeding.   Objective: Blood pressure 126/79, pulse 102, temperature 97.9 F (36.6 C), temperature source Oral, resp. rate 18, height 5\' 10"  (1.778 m), weight 178 lb 14.4 oz (81.149 kg).  No thrush or ulceration. Lungs clear. Regular cardiac rhythm. Port-A-Cath site without erythema. Abdomen soft and nontender. No hepatomegaly. Extremities without edema.  Lab Results: Lab Results  Component Value Date   WBC 5.8 07/12/2013   HGB 12.2* 07/12/2013   HCT 35.4* 07/12/2013   MCV 99.4* 07/12/2013   PLT 216 07/12/2013    Chemistry:    Chemistry      Component Value Date/Time   NA 143 07/12/2013 1052   NA 138 04/04/2013 1650   K 3.8 07/12/2013 1052   K 4.0 04/04/2013 1650   CL 109* 05/16/2013 0829   CL 105 04/04/2013 1650   CO2 23 07/12/2013 1052   CO2 24 04/04/2013 1650   BUN 20.9 07/12/2013 1052   BUN 28* 04/04/2013 1650   CREATININE 1.7* 07/12/2013 1052   CREATININE 1.48* 04/04/2013 1650      Component Value Date/Time   CALCIUM 9.1 07/12/2013 1052   CALCIUM 9.6 04/04/2013 1650   ALKPHOS 154* 07/12/2013 1052   ALKPHOS 99 07/26/2012 1008   AST 14 07/12/2013 1052   AST 13 07/26/2012 1008   ALT 11 07/12/2013 1052   ALT 10 07/26/2012 1008   BILITOT 0.58 07/12/2013 1052   BILITOT 0.5 07/26/2012 1008       Studies/Results: No results found.  Medications: I have reviewed the patient's current medications.  Assessment/Plan:  1. Metastatic rectal cancer status post biopsy of rectal mass 09/03/2011 with pathology showing invasive adenocarcinoma. Staging CT scans 09/03/2011 showed a 10 mm hypodense rounded lesion in left lateral hepatic lobe, similar 6 mm lesion in the superior right hepatic lobe and a smaller subcapsular lesion in the lateral right hepatic lobe; rectal mass within the lumen  of the bowel emanating from the left wall measuring 4.9 x 3.5 cm and a mass within the low left perirectal fat measuring 2.3 x 3.2 cm consistent with local extension of carcinoma. Staging PET scan 09/18/2011 showed a 5.1 cm distal sigmoid/rectal mass with max SUV 19.2 with local extension into the left perirectal fat with max SUV 7.7. At least 2 suspected hepatic metastases with max SUV 6.7 in the lateral segment left hepatic lobe and max SUV 4.1 in the lateral right hepatic dome. Additional tiny hypodensities in the liver may be cysts; patchy/nodular opacity in the left lower lobe with max SUV 3.5 worrisome for pulmonary metastasis, less likely infectious. He began radiation and concurrent Xeloda chemotherapy on 09/29/2011. He completed radiation on 11/06/2011. MRI of the liver on 12/08/2011 showed 2 hypermetabolic liver lesions, similar in size to 09/03/2011. 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 Equivocal 7 mm lesion in the inferior right hepatic lobe. No evidence of extrahepatic metastasis within the abdomen. An ultrasound-guided biopsy of a small left hepatic lesion on 12/23/2011 was nondiagnostic. He underwent a low anterior resection with loop ileostomy on 02/06/2012 with final pathology showing a 3.5 cm invasive adenocarcinoma with abundant extracellular mucin invading through the muscularis propria into pericolonic fatty tissue; 4 of 10 pericolonic lymph nodes were positive for metastatic carcinoma; resection margins were clear (ypT3,ypN2).  He began FOLFOX chemotherapy on 03/16/2012. He completed 2 cycles of FOLFOX chemotherapy and underwent an ileostomy reversal 05/05/2012. FOLFOX was resumed on 06/14/2012 and completed on 09/14/2012. 2. CEA 13.8 on 09/17/2011; 1.6 on 12/01/2011; 8.4 on 03/09/2012, 2.1 on 05/03/2012; 2.5 on 07/26/2012; 1.8 on 10/12/2012. 3.0 on 11/23/2012. CEA elevated at 25.4 on 02/22/2013, 44.7 on 04/04/2013, and 59.1 on  05/17/2013. 3. CT chest/abdomen/pelvis 03/10/2013 with 2 new left lower lobe nodules and 2 liver lesions increased in size. Progressive metastatic disease in the lungs and liver on a restaging CT 06/07/2013. He began treatment with FOLFIRI/Avastin on 06/21/2013. 4. K-ras mutation detected. 5. Rectal pain and bleeding secondary to #1. Resolved. 6. Frequent loose stools secondary to #1. Improved. 7. Hypertension. 8. Tobacco use. 9. CT scan 09/03/2011 with chronic right hydronephrosis secondary to an obstructing calculus in the mid right ureter. He is followed by Dr. Brunilda Payor. Stable on the CT 06/07/2013 10. Status post cystoscopy, right retrograde pyelogram, ureteroscopy and insertion of right double-J stent 11/28/2011. The double-J stent was replaced 02/06/2012. The stent has been removed. 11. Elevated BUN/creatinine. Likely secondary to dehydration while the ileostomy was in place. He now appears to have chronic renal insufficiency. 12. Status post ultrasound-guided fine-needle aspiration of small left hepatic lesion 12/22/2011. The pathology revealed no malignancy. 13. Malaise and weight loss. Resolved. 14. High output ileostomy. Partially improved with Imodium. Status post ileostomy reversal 05/05/2012. 15. Erectile dysfunction. He is followed by Dr. Brunilda Payor. 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 17. Diarrhea after FOLFOX chemotherapy given on 07/26/2012-resolved. The bolus and infusional 5-FU were dose reduced by 20% beginning with FOLFOX on 08/17/2012. He reports improvement in the diarrhea since he began taking Lomotil. 18. History of an Irregular heart rate- Status post evaluation by Dr. Clent Ridges on 11/02/2012 with findings of sinus rhythm with occasional PACs. 19. Diarrhea following cycle one FOLFIRI/Avastin. Resolved. 20. Hypokalemia 07/05/2013. Kdur was increased to 20 mEq twice daily for 2 days and then resumed at once daily. Potassium is in normal range today. He will  continue Kdur 20 mEq daily.  Disposition-Mr. Kirtz appears stable. He has completed one cycle of FOLFIRI/Avastin. Cycle 2 was held on 07/05/2013 for diarrhea. The diarrhea has resolved. Plan to proceed with cycle 2 today as scheduled. We reviewed the instructions for taking Lomotil. He will return for a followup visit and cycle 3 of FOLFIRI/Avastin on 07/27/2013. He will contact the office in the interim with any problems. We specifically discussed diarrhea.  Plan reviewed with Dr. Truett Perna.  Lonna Cobb ANP/GNP-BC

## 2013-07-12 NOTE — Telephone Encounter (Signed)
appts made and printed. Pt is aware that tx will follow on 07/27/13. i emailed MB to add the tx...td

## 2013-07-13 ENCOUNTER — Other Ambulatory Visit: Payer: Self-pay | Admitting: Certified Registered Nurse Anesthetist

## 2013-07-14 ENCOUNTER — Ambulatory Visit (HOSPITAL_BASED_OUTPATIENT_CLINIC_OR_DEPARTMENT_OTHER): Payer: 59

## 2013-07-14 VITALS — BP 144/83 | HR 84 | Temp 97.4°F

## 2013-07-14 DIAGNOSIS — C20 Malignant neoplasm of rectum: Secondary | ICD-10-CM

## 2013-07-14 DIAGNOSIS — Z452 Encounter for adjustment and management of vascular access device: Secondary | ICD-10-CM

## 2013-07-14 MED ORDER — SODIUM CHLORIDE 0.9 % IJ SOLN
10.0000 mL | INTRAMUSCULAR | Status: DC | PRN
  Administered 2013-07-14: 10 mL
  Filled 2013-07-14: qty 10

## 2013-07-14 MED ORDER — HEPARIN SOD (PORK) LOCK FLUSH 100 UNIT/ML IV SOLN
500.0000 [IU] | Freq: Once | INTRAVENOUS | Status: AC | PRN
  Administered 2013-07-14: 500 [IU]
  Filled 2013-07-14: qty 5

## 2013-07-18 ENCOUNTER — Telehealth: Payer: Self-pay | Admitting: *Deleted

## 2013-07-18 NOTE — Telephone Encounter (Signed)
Per staff message and POF I have scheduled appts.  JMW  

## 2013-07-19 ENCOUNTER — Ambulatory Visit: Payer: 59

## 2013-07-19 ENCOUNTER — Other Ambulatory Visit: Payer: 59 | Admitting: Lab

## 2013-07-19 ENCOUNTER — Ambulatory Visit: Payer: 59 | Admitting: Oncology

## 2013-07-22 ENCOUNTER — Ambulatory Visit (INDEPENDENT_AMBULATORY_CARE_PROVIDER_SITE_OTHER): Payer: 59 | Admitting: Internal Medicine

## 2013-07-22 ENCOUNTER — Encounter: Payer: Self-pay | Admitting: Internal Medicine

## 2013-07-22 VITALS — BP 166/86 | HR 88 | Temp 98.2°F | Resp 18 | Ht 70.0 in | Wt 178.0 lb

## 2013-07-22 DIAGNOSIS — A499 Bacterial infection, unspecified: Secondary | ICD-10-CM

## 2013-07-22 DIAGNOSIS — E876 Hypokalemia: Secondary | ICD-10-CM

## 2013-07-22 DIAGNOSIS — I10 Essential (primary) hypertension: Secondary | ICD-10-CM

## 2013-07-22 DIAGNOSIS — B9689 Other specified bacterial agents as the cause of diseases classified elsewhere: Secondary | ICD-10-CM

## 2013-07-22 DIAGNOSIS — J329 Chronic sinusitis, unspecified: Secondary | ICD-10-CM

## 2013-07-22 MED ORDER — POTASSIUM CHLORIDE CRYS ER 20 MEQ PO TBCR: 20.0000 meq | EXTENDED_RELEASE_TABLET | Freq: Every day | ORAL | Status: DC

## 2013-07-22 MED ORDER — LEVOFLOXACIN 250 MG PO TABS: 250.0000 mg | ORAL_TABLET | Freq: Every day | ORAL | Status: DC

## 2013-07-22 NOTE — Progress Notes (Signed)
Subjective:    Patient ID: [name] [name], male    DOB: [birth year], 62 y.o.   MRN: [id number]  HPI History of rectal cancer with metastasis history of chronic renal insufficiency and hypertension.  Followed by oncology for his cancer undergoing chemotherapy at this time elevated blood pressure today noted upon admission to clinic Repeat blood pressure Missing the evening BP medications   Review of Systems  Constitutional: Negative for fever and fatigue.  HENT: Negative for hearing loss, congestion, neck pain and postnasal drip.   Eyes: Negative for discharge, redness and visual disturbance.  Respiratory: Negative for cough, shortness of breath and wheezing.   Cardiovascular: Negative for leg swelling.  Gastrointestinal: Negative for abdominal pain, constipation and abdominal distention.  Genitourinary: Negative for urgency and frequency.  Musculoskeletal: Negative for joint swelling and arthralgias.  Skin: Negative for color change and rash.  Neurological: Negative for weakness and light-headedness.  Hematological: Negative for adenopathy.  Psychiatric/Behavioral: Negative for behavioral problems.   Past Medical History  Diagnosis Date  . Hypertension   . Abnormal EKG 11/01/11    REPORT IN EPIC-PT STATES NO KNOWN HEART PROBLEMS-NO CHEST PAINS  . 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 Low blood potassium     POTASSIUM WAS 2.1 ON 11/01/11 VISIT TO ER--PT PUT ON ORAL POTASSIUM--MOST RECENT LAB 11/24/11 POTASSIUM IMPROVED TO 3.1  . History of radiation therapy 09/29/11-11/07/11    rectal ca  . Nasal congestion   . COPD (chronic obstructive pulmonary disease)   . Heart murmur     SINCE BIRTH--DOES NOT CAUSE ANY PROBLEMS  . Dyspnea     no recent bronchitis  . Recurrent upper respiratory infection (URI)     HEAD COLD   . Sleep apnea     stop-bang score =6 (NO SLEEP STUDY DONE YET)  . No pertinent past medical history     2 SPOTS ON LIVER JUST WATCHING  . Hydronephrosis of right  kidney     "Chronic" -SECONDARY TO CALCULUS (STENT RT URETER) MID RT URETER AND ADDITIONAL CALCULI WITHIN RT KIDNEY--PT STATES HE HAS PASSED A STONE IN THE PAST--DID NOT KNOW HE HAD MORE STONES UNTIL CT ABD DONE ON 09/03/11  . Rectal cancer     HAS COMPLETED RADIATION AND XELODA--PLAN RESTAGING TESTING.  DR[facility] Truett Perna [facility]  . Cancer     rectal adenocarcinoma stage [id number]  . Hydroureter, right   . Nephrolithiasis     History   Social History  . Marital Status: Married    Spouse Name: N/A    Number of Children: N/A  . Years of Education: N/A   Occupational History  . Not on file.   Social History Main Topics  . Smoking status: Current Every Day Smoker -- 1.00 packs/day for 46 years    Types: Cigarettes  . Smokeless tobacco: Never Used  . Alcohol Use: No  . Drug Use: No  . Sexual Activity: Yes   Other Topics Concern  . Not on file   Social History Narrative  . No narrative on file    Past Surgical History  Procedure Laterality Date  . Wrist and hand surgery  yrs ago    LEFT WRIST AND RIGHT HAND--GANGLION LW  AND FX OF RT HAS  . Right hand surgery -repair fracture  yrs ago  . Removal ganglion cyst  yrs ago  . Cystoscopy with stent placment  jan 2013  . Appendectomy  02/06/2012  Procedure: APPENDECTOMY;  Surgeon: Kandis Cocking, MD;  Location: [facility];  Service: General;;  . Laparotomy  02/06/2012    Procedure: EXPLORATORY LAPAROTOMY;  Surgeon: Kandis Cocking, MD;  Location: [facility];  Service: General;;  . Ureterolithotomy  02/06/2012    Procedure: URETEROLITHOTOMY;  Surgeon: Lindaann Slough, MD;  Location: [facility];  Service: Urology;  Laterality: Right;  open exploration of right ureter, double J stent exchange, attempted ureteroscopy  . Colostomy    . Portacath placement  03/10/2012    Procedure: INSERTION PORT-A-CATH;  Surgeon: Kandis Cocking, MD;  Location: [facility];  Service: General;  Laterality: N/A;  . Colon surgery    . Ileo loop  colostomy closure  05/05/2012    Procedure: LAPAROSCOPIC ILEO LOOP COLOSTOMY CLOSURE;  Surgeon: Kandis Cocking, MD;  Location: [facility];  Service: General;  Laterality: N/A;  Laparoscopic Assisted Ileostomy reversal  . Metastatic recatal cnacer      liver and lung    Family History  Problem Relation Age of Onset  . Lupus    . Anemia Mother   . Hypertension Father   . Kidney disease Father 75    renal failure  . Lupus Sister   . Lupus Brother     No Known Allergies  Current Outpatient Prescriptions on File Prior to Visit  Medication Sig Dispense Refill  . amLODipine (NORVASC) 10 MG tablet Take 1 tablet (10 mg total) by mouth daily.  90 tablet  3  . diphenoxylate-atropine (LOMOTIL) 2.5-0.025 MG per tablet Take 1 tablet by mouth 4 (four) times daily as needed for diarrhea or loose stools.  30 tablet  0  . KLOR-CON M20 20 MEQ tablet Take 20 mEq by mouth daily.      . naproxen sodium (ANAPROX) 220 MG tablet Take 220 mg by mouth as needed.      . terazosin (HYTRIN) 5 MG capsule Take 1 capsule (5 mg total) by mouth at bedtime.  90 capsule  3   No current facility-administered medications on file prior to visit.    BP 166/86  Pulse 88  Temp(Src) 98.2 F (36.8 C)  Resp 18  Ht 5\' 10"  (1.778 m)  Wt 178 lb (80.74 kg)  BMI 25.54 kg/m2       Objective:   Physical Exam  Nursing note and vitals reviewed. Constitutional: He appears well-developed and well-nourished.  HENT:  Head: Normocephalic and atraumatic.  Eyes: Conjunctivae are normal. Pupils are equal, round, and reactive to light.  Neck: Normal range of motion. Neck supple.  Cardiovascular: Normal rate and regular rhythm.   Pulmonary/Chest: Effort normal and breath sounds normal.  Abdominal: Soft. Bowel sounds are normal.          Assessment & Plan:  Since he is missing the evening dose we will be scheduled amlodipine and Hytrin at the same time Refill his potassium for  Renal function and potassium are stable and the  blood work has been doing at oncology Oncology were following  CEA b met on a regular basis Acute bacterial sinusitis we'll put him on Levaquin 250 mg by mouth daily for 21 days

## 2013-07-23 ENCOUNTER — Other Ambulatory Visit: Payer: Self-pay | Admitting: Oncology

## 2013-07-26 ENCOUNTER — Other Ambulatory Visit: Payer: 59 | Admitting: Lab

## 2013-07-27 ENCOUNTER — Other Ambulatory Visit (HOSPITAL_BASED_OUTPATIENT_CLINIC_OR_DEPARTMENT_OTHER): Payer: 59 | Admitting: Lab

## 2013-07-27 ENCOUNTER — Ambulatory Visit (HOSPITAL_BASED_OUTPATIENT_CLINIC_OR_DEPARTMENT_OTHER): Payer: 59

## 2013-07-27 ENCOUNTER — Ambulatory Visit (HOSPITAL_BASED_OUTPATIENT_CLINIC_OR_DEPARTMENT_OTHER): Payer: 59 | Admitting: Oncology

## 2013-07-27 ENCOUNTER — Telehealth: Payer: Self-pay | Admitting: *Deleted

## 2013-07-27 ENCOUNTER — Telehealth: Payer: Self-pay

## 2013-07-27 VITALS — BP 154/81 | HR 87 | Temp 97.0°F | Resp 18 | Ht 70.0 in | Wt 180.8 lb

## 2013-07-27 DIAGNOSIS — E876 Hypokalemia: Secondary | ICD-10-CM

## 2013-07-27 DIAGNOSIS — C7800 Secondary malignant neoplasm of unspecified lung: Secondary | ICD-10-CM

## 2013-07-27 DIAGNOSIS — Z5112 Encounter for antineoplastic immunotherapy: Secondary | ICD-10-CM

## 2013-07-27 DIAGNOSIS — R944 Abnormal results of kidney function studies: Secondary | ICD-10-CM

## 2013-07-27 DIAGNOSIS — Z5111 Encounter for antineoplastic chemotherapy: Secondary | ICD-10-CM

## 2013-07-27 DIAGNOSIS — C787 Secondary malignant neoplasm of liver and intrahepatic bile duct: Secondary | ICD-10-CM

## 2013-07-27 DIAGNOSIS — C20 Malignant neoplasm of rectum: Secondary | ICD-10-CM

## 2013-07-27 DIAGNOSIS — F17200 Nicotine dependence, unspecified, uncomplicated: Secondary | ICD-10-CM

## 2013-07-27 LAB — COMPREHENSIVE METABOLIC PANEL (CC13)
AST: 12 U/L (ref 5–34)
Alkaline Phosphatase: 155 U/L — ABNORMAL HIGH (ref 40–150)
BUN: 25 mg/dL (ref 7.0–26.0)
Creatinine: 1.6 mg/dL — ABNORMAL HIGH (ref 0.7–1.3)
Total Bilirubin: 0.55 mg/dL (ref 0.20–1.20)

## 2013-07-27 LAB — CBC WITH DIFFERENTIAL/PLATELET
Basophils Absolute: 0 10*3/uL (ref 0.0–0.1)
EOS%: 3.9 % (ref 0.0–7.0)
HGB: 11.6 g/dL — ABNORMAL LOW (ref 13.0–17.1)
LYMPH%: 21.2 % (ref 14.0–49.0)
MCH: 34.9 pg — ABNORMAL HIGH (ref 27.2–33.4)
MCV: 100.8 fL — ABNORMAL HIGH (ref 79.3–98.0)
MONO%: 9.7 % (ref 0.0–14.0)
NEUT%: 64.5 % (ref 39.0–75.0)
Platelets: 169 10*3/uL (ref 140–400)
RDW: 16.1 % — ABNORMAL HIGH (ref 11.0–14.6)

## 2013-07-27 MED ORDER — ATROPINE SULFATE 1 MG/ML IJ SOLN
0.5000 mg | Freq: Once | INTRAMUSCULAR | Status: AC | PRN
  Administered 2013-07-27: 0.5 mg via INTRAVENOUS

## 2013-07-27 MED ORDER — SODIUM CHLORIDE 0.9 % IV SOLN
Freq: Once | INTRAVENOUS | Status: AC
  Administered 2013-07-27: 10:00:00 via INTRAVENOUS

## 2013-07-27 MED ORDER — ONDANSETRON 16 MG/50ML IVPB (CHCC)
16.0000 mg | Freq: Once | INTRAVENOUS | Status: AC
  Administered 2013-07-27: 16 mg via INTRAVENOUS

## 2013-07-27 MED ORDER — SODIUM CHLORIDE 0.9 % IV SOLN
5.0000 mg/kg | Freq: Once | INTRAVENOUS | Status: AC
  Administered 2013-07-27: 425 mg via INTRAVENOUS
  Filled 2013-07-27: qty 17

## 2013-07-27 MED ORDER — IRINOTECAN HCL CHEMO INJECTION 100 MG/5ML
144.0000 mg/m2 | Freq: Once | INTRAVENOUS | Status: AC
  Administered 2013-07-27: 290 mg via INTRAVENOUS
  Filled 2013-07-27: qty 14.5

## 2013-07-27 MED ORDER — DEXAMETHASONE SODIUM PHOSPHATE 20 MG/5ML IJ SOLN
20.0000 mg | Freq: Once | INTRAMUSCULAR | Status: AC
  Administered 2013-07-27: 20 mg via INTRAVENOUS

## 2013-07-27 MED ORDER — SODIUM CHLORIDE 0.9 % IV SOLN
1920.0000 mg/m2 | INTRAVENOUS | Status: DC
  Administered 2013-07-27: 3900 mg via INTRAVENOUS
  Filled 2013-07-27: qty 78

## 2013-07-27 MED ORDER — LEUCOVORIN CALCIUM INJECTION 350 MG
320.0000 mg/m2 | Freq: Once | INTRAVENOUS | Status: AC
  Administered 2013-07-27: 646 mg via INTRAVENOUS
  Filled 2013-07-27: qty 32.3

## 2013-07-27 MED ORDER — FLUOROURACIL CHEMO INJECTION 2.5 GM/50ML
320.0000 mg/m2 | Freq: Once | INTRAVENOUS | Status: AC
  Administered 2013-07-27: 650 mg via INTRAVENOUS
  Filled 2013-07-27: qty 13

## 2013-07-27 NOTE — Progress Notes (Signed)
[facility]    OFFICE PROGRESS NOTE   INTERVAL HISTORY:   He returns as scheduled. He completed another cycle of FOLFIRI/Avastin on 07/12/2013. He reports mild diarrhea following chemotherapy. He feels well. No nausea/vomiting. No symptoms of thrombosis.  Objective:  Vital signs in last 24 hours:  Blood pressure 154/81, pulse 87, temperature 97 F (36.1 C), temperature source Oral, resp. rate 18, height 5\' 10"  (1.778 m), weight 180 lb 12.8 oz (82.01 kg).    HEENT: no thrush or ulcers Resp: lungs clear bilaterally Cardio: regular rate and rhythm GI: no hepatomegaly, nontender Vascular: no leg edema    Portacath/PICC-without erythema  Lab Results:  Lab Results  Component Value Date   WBC 3.5* 07/27/2013   HGB 11.6* 07/27/2013   HCT 33.5* 07/27/2013   MCV 100.8* 07/27/2013   PLT 169 07/27/2013  ANC 2.3    Medications: I have reviewed the patient's current medications.  Assessment/Plan: 1. Metastatic rectal cancer status post biopsy of rectal mass 09/03/2011 with pathology showing invasive adenocarcinoma. Staging CT scans 09/03/2011 showed a 10 mm hypodense rounded lesion in left lateral hepatic lobe, similar 6 mm lesion in the superior right hepatic lobe and a smaller subcapsular lesion in the lateral right hepatic lobe; rectal mass within the lumen of the bowel emanating from the left wall measuring 4.9 x 3.5 cm and a mass within the low left perirectal fat measuring 2.3 x 3.2 cm consistent with local extension of carcinoma. Staging PET scan 09/18/2011 showed a 5.1 cm distal sigmoid/rectal mass with max SUV 19.2 with local extension into the left perirectal fat with max SUV 7.7. At least 2 suspected hepatic metastases with max SUV 6.7 in the lateral segment left hepatic lobe and max SUV 4.1 in the lateral right hepatic dome. Additional tiny hypodensities in the liver may be cysts; patchy/nodular opacity in the left lower lobe with max SUV 3.5 worrisome for  pulmonary metastasis, less likely infectious. He began radiation and concurrent Xeloda chemotherapy on 09/29/2011. He completed radiation on 11/06/2011. MRI of the liver on 12/08/2011 showed 2 hypermetabolic liver lesions, similar in size to 09/03/2011. 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 Equivocal 7 mm lesion in the inferior right hepatic lobe. No evidence of extrahepatic metastasis within the abdomen. An ultrasound-guided biopsy of a small left hepatic lesion on 12/23/2011 was nondiagnostic. He underwent a low anterior resection with loop ileostomy on 02/06/2012 with final pathology showing a 3.5 cm invasive adenocarcinoma with abundant extracellular mucin invading through the muscularis propria into pericolonic fatty tissue; 4 of 10 pericolonic lymph nodes were positive for metastatic carcinoma; resection margins were clear (ypT3,ypN2). He began FOLFOX chemotherapy on 03/16/2012. He completed 2 cycles of FOLFOX chemotherapy and underwent an ileostomy reversal 05/05/2012. FOLFOX was resumed on 06/14/2012 and completed on 09/14/2012. 2. CEA 13.8 on 09/17/2011; 1.6 on 12/01/2011; 8.4 on 03/09/2012, 2.1 on 05/03/2012; 2.5 on 07/26/2012; 1.8 on 10/12/2012. 3.0 on 11/23/2012. CEA elevated at 25.4 on 02/22/2013, 44.7 on 04/04/2013, and 59.1 on 05/17/2013. 3. CT chest/abdomen/pelvis 03/10/2013 with 2 new left lower lobe nodules and 2 liver lesions increased in size. Progressive metastatic disease in the lungs and liver on a restaging CT 06/07/2013. He began treatment with FOLFIRI/Avastin on 06/21/2013. 4. K-ras mutation detected. 5. Rectal pain and bleeding secondary to #1. Resolved. 6. Frequent loose stools secondary to #1. Improved. 7. Hypertension. 8. Tobacco use. 9. CT scan 09/03/2011 with chronic right hydronephrosis secondary to an obstructing  calculus in the mid right ureter. He is followed by Dr. Brunilda Payor. Stable on the CT  06/07/2013 10. Status post cystoscopy, right retrograde pyelogram, ureteroscopy and insertion of right double-J stent 11/28/2011. The double-J stent was replaced 02/06/2012. The stent has been removed. 11. Elevated BUN/creatinine. Likely secondary to dehydration while the ileostomy was in place. He now appears to have chronic renal insufficiency. 12. Status post ultrasound-guided fine-needle aspiration of small left hepatic lesion 12/22/2011. The pathology revealed no malignancy. 13. Malaise and weight loss. Resolved. 14. High output ileostomy. Partially improved with Imodium. Status post ileostomy reversal 05/05/2012. 15. Erectile dysfunction. He is followed by Dr. Brunilda Payor. 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 17. Diarrhea after FOLFOX chemotherapy given on 07/26/2012-resolved. The bolus and infusional 5-FU were dose reduced by 20% beginning with FOLFOX on 08/17/2012. He reports improvement in the diarrhea since he began taking Lomotil. 18. History of an Irregular heart rate- Status post evaluation by Dr. Clent Ridges on 11/02/2012 with findings of sinus rhythm with occasional PACs. 19. Diarrhea following cycle one FOLFIRI/Avastin. Resolved.       20. Hypokalemia 07/05/2013. Now taking potassium  Disposition:  [name] appears to be tolerating the FOLFIRI/Avastin well. He will complete a third cycle today. He will return for an office visit and chemotherapy in 2 weeks. The plan is to obtain a restaging CT evaluation after 5 or 6 cycles.   [name] Papas, MD  07/27/2013  8:58 PM

## 2013-07-27 NOTE — Telephone Encounter (Signed)
gv and printed appt sched and avs for pt...emailed MW to add tx... °

## 2013-07-27 NOTE — Telephone Encounter (Signed)
Per staff message and POF I have scheduled appts.  JMW  

## 2013-07-27 NOTE — Patient Instructions (Addendum)
[facility] Discharge Instructions for Patients Receiving Chemotherapy  Today you received the following chemotherapy agents Avastin, Leucovorin, Camptosar and Adrucil.  To help prevent nausea and vomiting after your treatment, we encourage you to take your nausea medication as prescribed.   If you develop nausea and vomiting that is not controlled by your nausea medication, call the clinic.   BELOW ARE SYMPTOMS THAT SHOULD BE REPORTED IMMEDIATELY:  *FEVER GREATER THAN 100.5 F  *CHILLS WITH OR WITHOUT FEVER  NAUSEA AND VOMITING THAT IS NOT CONTROLLED WITH YOUR NAUSEA MEDICATION  *UNUSUAL SHORTNESS OF BREATH  *UNUSUAL BRUISING OR BLEEDING  TENDERNESS IN MOUTH AND THROAT WITH OR WITHOUT PRESENCE OF ULCERS  *URINARY PROBLEMS  *BOWEL PROBLEMS  UNUSUAL RASH Items with * indicate a potential emergency and should be followed up as soon as possible.  Feel free to call the clinic you have any questions or concerns. The clinic phone number is [phone].    

## 2013-07-29 ENCOUNTER — Ambulatory Visit (HOSPITAL_BASED_OUTPATIENT_CLINIC_OR_DEPARTMENT_OTHER): Payer: 59

## 2013-07-29 ENCOUNTER — Other Ambulatory Visit: Payer: Self-pay | Admitting: Certified Registered Nurse Anesthetist

## 2013-07-29 VITALS — BP 130/79 | HR 96 | Temp 98.6°F | Resp 20

## 2013-07-29 DIAGNOSIS — C20 Malignant neoplasm of rectum: Secondary | ICD-10-CM

## 2013-07-29 MED ORDER — SODIUM CHLORIDE 0.9 % IJ SOLN
10.0000 mL | INTRAMUSCULAR | Status: DC | PRN
  Administered 2013-07-29: 10 mL
  Filled 2013-07-29: qty 10

## 2013-07-29 MED ORDER — HEPARIN SOD (PORK) LOCK FLUSH 100 UNIT/ML IV SOLN
500.0000 [IU] | Freq: Once | INTRAVENOUS | Status: AC | PRN
  Administered 2013-07-29: 500 [IU]
  Filled 2013-07-29: qty 5

## 2013-08-08 ENCOUNTER — Other Ambulatory Visit: Payer: Self-pay | Admitting: Oncology

## 2013-08-10 ENCOUNTER — Ambulatory Visit: Payer: 59

## 2013-08-10 ENCOUNTER — Ambulatory Visit (HOSPITAL_BASED_OUTPATIENT_CLINIC_OR_DEPARTMENT_OTHER): Payer: 59 | Admitting: Oncology

## 2013-08-10 ENCOUNTER — Telehealth: Payer: Self-pay | Admitting: *Deleted

## 2013-08-10 ENCOUNTER — Telehealth: Payer: Self-pay

## 2013-08-10 ENCOUNTER — Other Ambulatory Visit (HOSPITAL_BASED_OUTPATIENT_CLINIC_OR_DEPARTMENT_OTHER): Payer: 59 | Admitting: Lab

## 2013-08-10 VITALS — BP 144/79 | HR 103 | Temp 97.6°F | Resp 18 | Ht 70.0 in | Wt 176.9 lb

## 2013-08-10 DIAGNOSIS — C7800 Secondary malignant neoplasm of unspecified lung: Secondary | ICD-10-CM

## 2013-08-10 DIAGNOSIS — E876 Hypokalemia: Secondary | ICD-10-CM

## 2013-08-10 DIAGNOSIS — C787 Secondary malignant neoplasm of liver and intrahepatic bile duct: Secondary | ICD-10-CM

## 2013-08-10 DIAGNOSIS — R197 Diarrhea, unspecified: Secondary | ICD-10-CM

## 2013-08-10 DIAGNOSIS — C20 Malignant neoplasm of rectum: Secondary | ICD-10-CM

## 2013-08-10 DIAGNOSIS — F17200 Nicotine dependence, unspecified, uncomplicated: Secondary | ICD-10-CM

## 2013-08-10 LAB — COMPREHENSIVE METABOLIC PANEL (CC13)
AST: 12 U/L (ref 5–34)
BUN: 21.5 mg/dL (ref 7.0–26.0)
Calcium: 8.7 mg/dL (ref 8.4–10.4)
Chloride: 111 mEq/L — ABNORMAL HIGH (ref 98–109)
Creatinine: 1.6 mg/dL — ABNORMAL HIGH (ref 0.7–1.3)

## 2013-08-10 LAB — CBC WITH DIFFERENTIAL/PLATELET
Basophils Absolute: 0 10*3/uL (ref 0.0–0.1)
EOS%: 1.1 % (ref 0.0–7.0)
HCT: 36.1 % — ABNORMAL LOW (ref 38.4–49.9)
HGB: 12.4 g/dL — ABNORMAL LOW (ref 13.0–17.1)
LYMPH%: 19.2 % (ref 14.0–49.0)
MCH: 35.1 pg — ABNORMAL HIGH (ref 27.2–33.4)
MCV: 101.8 fL — ABNORMAL HIGH (ref 79.3–98.0)
MONO%: 6.4 % (ref 0.0–14.0)
NEUT%: 72.8 % (ref 39.0–75.0)
Platelets: 167 10*3/uL (ref 140–400)
lymph#: 0.7 10*3/uL — ABNORMAL LOW (ref 0.9–3.3)

## 2013-08-10 NOTE — Progress Notes (Signed)
[facility]    OFFICE PROGRESS NOTE   INTERVAL HISTORY:   He returns as scheduled. He completed another cycle of FOLFIRI/Avastin on 07/27/2013. Mr. Mosco reports nausea and vomiting on day 2. He has developed diarrhea over the past several days. He is taking Lomotil 1 daily. He complains of "sinus "congestion. He was prescribed an antibiotic and Nasonex by his primary physician. No fever.  Objective:  Vital signs in last 24 hours:  Blood pressure 144/79, pulse 103, temperature 97.6 F (36.4 C), temperature source Oral, resp. rate 18, height 5\' 10"  (1.778 m), weight 176 lb 14.4 oz (80.241 kg).    HEENT: No thrush or ulcers, no sinus  tenderness Resp: Lungs clear bilaterally Cardio: Regular rate and rhythm GI: No hepatomegaly, nontender Vascular: No leg edema   Portacath/PICC-without erythema  Lab Results:  Lab Results  Component Value Date   WBC 3.6* 08/10/2013   HGB 12.4* 08/10/2013   HCT 36.1* 08/10/2013   MCV 101.8* 08/10/2013   PLT 167 08/10/2013   ANC 2.6    Medications: I have reviewed the patient's current medications.  Assessment/Plan: 1. Metastatic rectal cancer status post biopsy of rectal mass 09/03/2011 with pathology showing invasive adenocarcinoma. Staging CT scans 09/03/2011 showed a 10 mm hypodense rounded lesion in left lateral hepatic lobe, similar 6 mm lesion in the superior right hepatic lobe and a smaller subcapsular lesion in the lateral right hepatic lobe; rectal mass within the lumen of the bowel emanating from the left wall measuring 4.9 x 3.5 cm and a mass within the low left perirectal fat measuring 2.3 x 3.2 cm consistent with local extension of carcinoma. Staging PET scan 09/18/2011 showed a 5.1 cm distal sigmoid/rectal mass with max SUV 19.2 with local extension into the left perirectal fat with max SUV 7.7. At least 2 suspected hepatic metastases with max SUV 6.7 in the lateral segment left hepatic lobe and max SUV 4.1 in the lateral  right hepatic dome. Additional tiny hypodensities in the liver may be cysts; patchy/nodular opacity in the left lower lobe with max SUV 3.5 worrisome for pulmonary metastasis, less likely infectious. He began radiation and concurrent Xeloda chemotherapy on 09/29/2011. He completed radiation on 11/06/2011. MRI of the liver on 12/08/2011 showed 2 hypermetabolic liver lesions, similar in size to 09/03/2011. 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 Equivocal 7 mm lesion in the inferior right hepatic lobe. No evidence of extrahepatic metastasis within the abdomen. An ultrasound-guided biopsy of a small left hepatic lesion on 12/23/2011 was nondiagnostic. He underwent a low anterior resection with loop ileostomy on 02/06/2012 with final pathology showing a 3.5 cm invasive adenocarcinoma with abundant extracellular mucin invading through the muscularis propria into pericolonic fatty tissue; 4 of 10 pericolonic lymph nodes were positive for metastatic carcinoma; resection margins were clear (ypT3,ypN2). He began FOLFOX chemotherapy on 03/16/2012. He completed 2 cycles of FOLFOX chemotherapy and underwent an ileostomy reversal 05/05/2012. FOLFOX was resumed on 06/14/2012 and completed on 09/14/2012. 2. CEA 13.8 on 09/17/2011; 1.6 on 12/01/2011; 8.4 on 03/09/2012, 2.1 on 05/03/2012; 2.5 on 07/26/2012; 1.8 on 10/12/2012. 3.0 on 11/23/2012. CEA elevated at 25.4 on 02/22/2013, 44.7 on 04/04/2013, and 59.1 on 05/17/2013. 3. CT chest/abdomen/pelvis 03/10/2013 with 2 new left lower lobe nodules and 2 liver lesions increased in size. Progressive metastatic disease in the lungs and liver on a restaging CT 06/07/2013. He began treatment with FOLFIRI/Avastin on 06/21/2013. 4. K-ras mutation detected. 5. Rectal pain  and bleeding secondary to #1. Resolved. 6. Frequent loose stools secondary to #1. Improved. 7. Hypertension. 8. Tobacco use. 9. CT scan 09/03/2011 with  chronic right hydronephrosis secondary to an obstructing calculus in the mid right ureter. He is followed by Dr. Brunilda Payor. Stable on the CT 06/07/2013 10. Status post cystoscopy, right retrograde pyelogram, ureteroscopy and insertion of right double-J stent 11/28/2011. The double-J stent was replaced 02/06/2012. The stent has been removed. 11. Elevated BUN/creatinine. Likely secondary to dehydration while the ileostomy was in place. He now appears to have chronic renal insufficiency. 12. Status post ultrasound-guided fine-needle aspiration of small left hepatic lesion 12/22/2011. The pathology revealed no malignancy. 13. Malaise and weight loss. Resolved. 14. High output ileostomy. Partially improved with Imodium. Status post ileostomy reversal 05/05/2012. 15. Erectile dysfunction. He is followed by Dr. Brunilda Payor. 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 17. Diarrhea after FOLFOX chemotherapy given on 07/26/2012-resolved. The bolus and infusional 5-FU were dose reduced by 20% beginning with FOLFOX on 08/17/2012.  18. History of an Irregular heart rate- Status post evaluation by Dr. Clent Ridges on 11/02/2012 with findings of sinus rhythm with occasional PACs. 19. Diarrhea following FOLFIRI/Avastin. He reports increased diarrhea following the most recent cycle of chemotherapy.       20. Hypokalemia 07/05/2013. Now taking potassium   Disposition:  [name] [name] has completed 3 cycles of FOLFIRI/Avastin. He has sinus symptoms and increased diarrhea today. We decided to hold the next cycle of chemotherapy for one week. He will return for cycle 4 on 08/17/2013. He is scheduled for an office visit and cycle 5 on 08/31/2013. The plan is to obtain a restaging CT evaluation after cycle 5. We will followup on the CEA from today. He will contact us for increased diarrhea. He will use the Lomotil more frequently.   Thornton Papas, MD  08/10/2013  2:17 PM

## 2013-08-10 NOTE — Telephone Encounter (Signed)
Per staff message and POF I have scheduled appts.  JMW  

## 2013-08-10 NOTE — Telephone Encounter (Signed)
gv and printed appt sched for pt for SEpt...emailed MW to add tx.

## 2013-08-17 ENCOUNTER — Ambulatory Visit (HOSPITAL_BASED_OUTPATIENT_CLINIC_OR_DEPARTMENT_OTHER): Payer: 59

## 2013-08-17 ENCOUNTER — Other Ambulatory Visit (HOSPITAL_BASED_OUTPATIENT_CLINIC_OR_DEPARTMENT_OTHER): Payer: 59 | Admitting: Lab

## 2013-08-17 VITALS — BP 157/77 | HR 72 | Temp 98.0°F

## 2013-08-17 DIAGNOSIS — Z5112 Encounter for antineoplastic immunotherapy: Secondary | ICD-10-CM

## 2013-08-17 DIAGNOSIS — Z5111 Encounter for antineoplastic chemotherapy: Secondary | ICD-10-CM

## 2013-08-17 DIAGNOSIS — C7800 Secondary malignant neoplasm of unspecified lung: Secondary | ICD-10-CM

## 2013-08-17 DIAGNOSIS — C20 Malignant neoplasm of rectum: Secondary | ICD-10-CM

## 2013-08-17 LAB — CBC WITH DIFFERENTIAL/PLATELET
BASO%: 0.9 % (ref 0.0–2.0)
Eosinophils Absolute: 0.1 10*3/uL (ref 0.0–0.5)
MCHC: 34.7 g/dL (ref 32.0–36.0)
MONO#: 0.4 10*3/uL (ref 0.1–0.9)
NEUT#: 2.5 10*3/uL (ref 1.5–6.5)
Platelets: 173 10*3/uL (ref 140–400)
RBC: 3.52 10*6/uL — ABNORMAL LOW (ref 4.20–5.82)
RDW: 16.7 % — ABNORMAL HIGH (ref 11.0–14.6)
WBC: 3.9 10*3/uL — ABNORMAL LOW (ref 4.0–10.3)
lymph#: 0.9 10*3/uL (ref 0.9–3.3)

## 2013-08-17 MED ORDER — FLUOROURACIL CHEMO INJECTION 2.5 GM/50ML
320.0000 mg/m2 | Freq: Once | INTRAVENOUS | Status: AC
  Administered 2013-08-17: 650 mg via INTRAVENOUS
  Filled 2013-08-17: qty 13

## 2013-08-17 MED ORDER — PALONOSETRON HCL INJECTION 0.25 MG/5ML
0.2500 mg | Freq: Once | INTRAVENOUS | Status: AC
  Administered 2013-08-17: 0.25 mg via INTRAVENOUS

## 2013-08-17 MED ORDER — SODIUM CHLORIDE 0.9 % IJ SOLN
10.0000 mL | INTRAMUSCULAR | Status: DC | PRN
  Filled 2013-08-17: qty 10

## 2013-08-17 MED ORDER — IRINOTECAN HCL CHEMO INJECTION 100 MG/5ML
144.0000 mg/m2 | Freq: Once | INTRAVENOUS | Status: AC
  Administered 2013-08-17: 290 mg via INTRAVENOUS
  Filled 2013-08-17: qty 14.5

## 2013-08-17 MED ORDER — SODIUM CHLORIDE 0.9 % IV SOLN
5.0000 mg/kg | Freq: Once | INTRAVENOUS | Status: AC
  Administered 2013-08-17: 425 mg via INTRAVENOUS
  Filled 2013-08-17: qty 17

## 2013-08-17 MED ORDER — DEXAMETHASONE SODIUM PHOSPHATE 20 MG/5ML IJ SOLN
20.0000 mg | Freq: Once | INTRAMUSCULAR | Status: AC
  Administered 2013-08-17: 20 mg via INTRAVENOUS

## 2013-08-17 MED ORDER — ATROPINE SULFATE 1 MG/ML IJ SOLN
0.5000 mg | Freq: Once | INTRAMUSCULAR | Status: AC | PRN
  Administered 2013-08-17: 0.5 mg via INTRAVENOUS

## 2013-08-17 MED ORDER — LEUCOVORIN CALCIUM INJECTION 350 MG
320.0000 mg/m2 | Freq: Once | INTRAVENOUS | Status: AC
  Administered 2013-08-17: 646 mg via INTRAVENOUS
  Filled 2013-08-17: qty 32.3

## 2013-08-17 MED ORDER — ATROPINE SULFATE 1 MG/ML IJ SOLN
INTRAMUSCULAR | Status: AC
  Administered 2013-08-17: 0.5 mg via INTRAVENOUS
  Filled 2013-08-17: qty 1

## 2013-08-17 MED ORDER — DEXAMETHASONE SODIUM PHOSPHATE 20 MG/5ML IJ SOLN
INTRAMUSCULAR | Status: AC
  Administered 2013-08-17: 20 mg via INTRAVENOUS
  Filled 2013-08-17: qty 5

## 2013-08-17 MED ORDER — SODIUM CHLORIDE 0.9 % IV SOLN
Freq: Once | INTRAVENOUS | Status: AC
  Administered 2013-08-17: 08:00:00 via INTRAVENOUS

## 2013-08-17 MED ORDER — HEPARIN SOD (PORK) LOCK FLUSH 100 UNIT/ML IV SOLN
500.0000 [IU] | Freq: Once | INTRAVENOUS | Status: DC | PRN
  Filled 2013-08-17: qty 5

## 2013-08-17 MED ORDER — PALONOSETRON HCL INJECTION 0.25 MG/5ML
INTRAVENOUS | Status: AC
  Administered 2013-08-17: 0.25 mg via INTRAVENOUS
  Filled 2013-08-17: qty 5

## 2013-08-17 MED ORDER — FLUOROURACIL CHEMO INJECTION 5 GM/100ML
1920.0000 mg/m2 | INTRAVENOUS | Status: DC
  Administered 2013-08-17: 3900 mg via INTRAVENOUS
  Filled 2013-08-17: qty 78

## 2013-08-17 NOTE — Patient Instructions (Signed)
[facility] Discharge Instructions for Patients Receiving Chemotherapy  Today you received the following chemotherapy agents Avastin/5 FU/Leucovorin/Irinotecan  To help prevent nausea and vomiting after your treatment, we encourage you to take your nausea medication as prescribed.  If you develop nausea and vomiting that is not controlled by your nausea medication, call the clinic.   BELOW ARE SYMPTOMS THAT SHOULD BE REPORTED IMMEDIATELY:  *FEVER GREATER THAN 100.5 F  *CHILLS WITH OR WITHOUT FEVER  NAUSEA AND VOMITING THAT IS NOT CONTROLLED WITH YOUR NAUSEA MEDICATION  *UNUSUAL SHORTNESS OF BREATH  *UNUSUAL BRUISING OR BLEEDING  TENDERNESS IN MOUTH AND THROAT WITH OR WITHOUT PRESENCE OF ULCERS  *URINARY PROBLEMS  *BOWEL PROBLEMS  UNUSUAL RASH Items with * indicate a potential emergency and should be followed up as soon as possible.  Feel free to call the clinic you have any questions or concerns. The clinic phone number is [phone].    

## 2013-08-19 ENCOUNTER — Ambulatory Visit (HOSPITAL_BASED_OUTPATIENT_CLINIC_OR_DEPARTMENT_OTHER): Payer: 59

## 2013-08-19 VITALS — BP 120/72 | HR 90 | Temp 98.4°F | Resp 18

## 2013-08-19 DIAGNOSIS — C20 Malignant neoplasm of rectum: Secondary | ICD-10-CM

## 2013-08-19 MED ORDER — HEPARIN SOD (PORK) LOCK FLUSH 100 UNIT/ML IV SOLN
500.0000 [IU] | Freq: Once | INTRAVENOUS | Status: AC | PRN
  Administered 2013-08-19: 500 [IU]
  Filled 2013-08-19: qty 5

## 2013-08-19 MED ORDER — SODIUM CHLORIDE 0.9 % IJ SOLN
10.0000 mL | INTRAMUSCULAR | Status: DC | PRN
Start: 2013-08-19 — End: 2013-08-19
  Administered 2013-08-19: 10 mL
  Filled 2013-08-19: qty 10

## 2013-08-19 NOTE — Patient Instructions (Addendum)

## 2013-08-24 ENCOUNTER — Ambulatory Visit: Payer: 59

## 2013-08-24 ENCOUNTER — Other Ambulatory Visit: Payer: 59 | Admitting: Lab

## 2013-08-24 ENCOUNTER — Ambulatory Visit: Payer: 59 | Admitting: Nurse Practitioner

## 2013-08-25 ENCOUNTER — Encounter: Payer: Self-pay | Admitting: Oncology

## 2013-08-25 NOTE — Progress Notes (Signed)
Patient was approved for [name] [name] -- Avastin  9/17/4-9/16/15   24,000.00 I will send to billing and medical records.

## 2013-08-28 ENCOUNTER — Other Ambulatory Visit: Payer: Self-pay | Admitting: Oncology

## 2013-08-31 ENCOUNTER — Ambulatory Visit (HOSPITAL_BASED_OUTPATIENT_CLINIC_OR_DEPARTMENT_OTHER): Payer: 59 | Admitting: Nurse Practitioner

## 2013-08-31 ENCOUNTER — Other Ambulatory Visit (HOSPITAL_BASED_OUTPATIENT_CLINIC_OR_DEPARTMENT_OTHER): Payer: 59 | Admitting: Lab

## 2013-08-31 ENCOUNTER — Ambulatory Visit: Payer: 59

## 2013-08-31 ENCOUNTER — Other Ambulatory Visit: Payer: Self-pay | Admitting: *Deleted

## 2013-08-31 ENCOUNTER — Telehealth: Payer: Self-pay | Admitting: Oncology

## 2013-08-31 VITALS — BP 158/79 | HR 91 | Temp 97.6°F | Resp 18 | Ht 70.0 in | Wt 182.6 lb

## 2013-08-31 DIAGNOSIS — C801 Malignant (primary) neoplasm, unspecified: Secondary | ICD-10-CM

## 2013-08-31 DIAGNOSIS — C20 Malignant neoplasm of rectum: Secondary | ICD-10-CM

## 2013-08-31 DIAGNOSIS — C7800 Secondary malignant neoplasm of unspecified lung: Secondary | ICD-10-CM

## 2013-08-31 LAB — CBC WITH DIFFERENTIAL/PLATELET
BASO%: 0.4 % (ref 0.0–2.0)
Basophils Absolute: 0 10*3/uL (ref 0.0–0.1)
EOS%: 2 % (ref 0.0–7.0)
HGB: 12 g/dL — ABNORMAL LOW (ref 13.0–17.1)
MCH: 35.8 pg — ABNORMAL HIGH (ref 27.2–33.4)
MCHC: 35 g/dL (ref 32.0–36.0)
MCV: 102.3 fL — ABNORMAL HIGH (ref 79.3–98.0)
MONO%: 7.5 % (ref 0.0–14.0)
RDW: 16.2 % — ABNORMAL HIGH (ref 11.0–14.6)

## 2013-08-31 LAB — COMPREHENSIVE METABOLIC PANEL (CC13)
AST: 13 U/L (ref 5–34)
Albumin: 3.3 g/dL — ABNORMAL LOW (ref 3.5–5.0)
Alkaline Phosphatase: 122 U/L (ref 40–150)
BUN: 28.3 mg/dL — ABNORMAL HIGH (ref 7.0–26.0)
Creatinine: 1.5 mg/dL — ABNORMAL HIGH (ref 0.7–1.3)
Potassium: 3.5 mEq/L (ref 3.5–5.1)

## 2013-08-31 MED ORDER — DIPHENOXYLATE-ATROPINE 2.5-0.025 MG PO TABS: 1.0000 | ORAL_TABLET | Freq: Four times a day (QID) | ORAL | Status: DC | PRN

## 2013-08-31 MED ORDER — MOMETASONE FUROATE 50 MCG/ACT NA SUSP: 2.0000 | Freq: Every day | NASAL | Status: DC

## 2013-08-31 NOTE — Progress Notes (Signed)
OFFICE PROGRESS NOTE  Interval history:  [name] is a 62 year old man with metastatic rectal cancer on active treatment with FOLFIRI/Avastin. He completed cycle 4 on 08/17/2013. He is seen today for scheduled followup.  He reports diarrhea over the past 2-3 days. He estimates 3-5 loose stools per day. He takes Lomotil with partial relief. He again had nausea/vomiting on day 2. No other nausea or vomiting. He denies mouth sores. No hand or foot pain or redness. He notes that his skin is "darker". No rash. He continues to have "sinus congestion". He previously took Nasonex with relief. He is now out of Nasonex. He has stable dyspnea on exertion. Periodic productive cough. No fever. No leg swelling or calf pain. No unusual headaches. No vision change.   Objective: Blood pressure 158/79, pulse 91, temperature 97.6 F (36.4 C), temperature source Oral, resp. rate 18, height 5\' 10"  (1.778 m), weight 182 lb 9.6 oz (82.827 kg).  Oropharynx is without thrush or ulceration. Lungs are clear. No wheezes or rales. Regular cardiac rhythm. Port-A-Cath site is without erythema. Abdomen is soft and nontender. No hepatomegaly. Extremities are without edema. Motor strength 5 over 5. Palms without erythema.  Lab Results: Lab Results  Component Value Date   WBC 3.9* 08/31/2013   HGB 12.0* 08/31/2013   HCT 34.2* 08/31/2013   MCV 102.3* 08/31/2013   PLT 174 08/31/2013    Chemistry:    Chemistry      Component Value Date/Time   NA 143 08/31/2013 0850   NA 138 04/04/2013 1650   K 3.5 08/31/2013 0850   K 4.0 04/04/2013 1650   CL 109* 05/16/2013 0829   CL 105 04/04/2013 1650   CO2 18* 08/31/2013 0850   CO2 24 04/04/2013 1650   BUN 28.3* 08/31/2013 0850   BUN 28* 04/04/2013 1650   CREATININE 1.5* 08/31/2013 0850   CREATININE 1.48* 04/04/2013 1650      Component Value Date/Time   CALCIUM 8.5 08/31/2013 0850   CALCIUM 9.6 04/04/2013 1650   ALKPHOS 122 08/31/2013 0850   ALKPHOS 99 07/26/2012 1008   AST 13 08/31/2013 0850    AST 13 07/26/2012 1008   ALT 10 08/31/2013 0850   ALT 10 07/26/2012 1008   BILITOT 0.51 08/31/2013 0850   BILITOT 0.5 07/26/2012 1008       Studies/Results: No results found.  Medications: I have reviewed the patient's current medications.  Assessment/Plan:  1. Metastatic rectal cancer status post biopsy of rectal mass 09/03/2011 with pathology showing invasive adenocarcinoma. Staging CT scans 09/03/2011 showed a 10 mm hypodense rounded lesion in left lateral hepatic lobe, similar 6 mm lesion in the superior right hepatic lobe and a smaller subcapsular lesion in the lateral right hepatic lobe; rectal mass within the lumen of the bowel emanating from the left wall measuring 4.9 x 3.5 cm and a mass within the low left perirectal fat measuring 2.3 x 3.2 cm consistent with local extension of carcinoma. Staging PET scan 09/18/2011 showed a 5.1 cm distal sigmoid/rectal mass with max SUV 19.2 with local extension into the left perirectal fat with max SUV 7.7. At least 2 suspected hepatic metastases with max SUV 6.7 in the lateral segment left hepatic lobe and max SUV 4.1 in the lateral right hepatic dome. Additional tiny hypodensities in the liver may be cysts; patchy/nodular opacity in the left lower lobe with max SUV 3.5 worrisome for pulmonary metastasis, less likely infectious. He began radiation and concurrent Xeloda chemotherapy on 09/29/2011. He completed radiation on  11/06/2011. MRI of the liver on 12/08/2011 showed 2 hypermetabolic liver lesions, similar in size to 09/03/2011. 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 Equivocal 7 mm lesion in the inferior right hepatic lobe. No evidence of extrahepatic metastasis within the abdomen. An ultrasound-guided biopsy of a small left hepatic lesion on 12/23/2011 was nondiagnostic. He underwent a low anterior resection with loop ileostomy on 02/06/2012 with final pathology showing a 3.5 cm invasive  adenocarcinoma with abundant extracellular mucin invading through the muscularis propria into pericolonic fatty tissue; 4 of 10 pericolonic lymph nodes were positive for metastatic carcinoma; resection margins were clear (ypT3,ypN2). He began FOLFOX chemotherapy on 03/16/2012. He completed 2 cycles of FOLFOX chemotherapy and underwent an ileostomy reversal 05/05/2012. FOLFOX was resumed on 06/14/2012 and completed on 09/14/2012. 2. CEA 13.8 on 09/17/2011; 1.6 on 12/01/2011; 8.4 on 03/09/2012, 2.1 on 05/03/2012; 2.5 on 07/26/2012; 1.8 on 10/12/2012. 3.0 on 11/23/2012. CEA elevated at 25.4 on 02/22/2013, 44.7 on 04/04/2013, and 59.1 on 05/17/2013. CEA improved at 16.8 on 08/10/2013. 3. CT chest/abdomen/pelvis 03/10/2013 with 2 new left lower lobe nodules and 2 liver lesions increased in size. Progressive metastatic disease in the lungs and liver on a restaging CT 06/07/2013. He began treatment with FOLFIRI/Avastin on 06/21/2013. 4. K-ras mutation detected. 5. Rectal pain and bleeding secondary to #1. Resolved. 6. Frequent loose stools secondary to #1. Improved. 7. Hypertension. 8. Tobacco use. 9. CT scan 09/03/2011 with chronic right hydronephrosis secondary to an obstructing calculus in the mid right ureter. He is followed by Dr. Brunilda Payor. Stable on the CT 06/07/2013 10. Status post cystoscopy, right retrograde pyelogram, ureteroscopy and insertion of right double-J stent 11/28/2011. The double-J stent was replaced 02/06/2012. The stent has been removed. 11. Elevated BUN/creatinine. Likely secondary to dehydration while the ileostomy was in place. He now appears to have chronic renal insufficiency. 12. Status post ultrasound-guided fine-needle aspiration of small left hepatic lesion 12/22/2011. The pathology revealed no malignancy. 13. Malaise and weight loss. Resolved. 14. High output ileostomy. Partially improved with Imodium. Status post ileostomy reversal 05/05/2012. 15. Erectile dysfunction. He is  followed by Dr. Brunilda Payor. 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 17. Diarrhea after FOLFOX chemotherapy given on 07/26/2012-resolved. The bolus and infusional 5-FU were dose reduced by 20% beginning with FOLFOX on 08/17/2012.  18. History of an Irregular heart rate- Status post evaluation by Dr. Clent Ridges on 11/02/2012 with findings of sinus rhythm with occasional PACs. 19. Diarrhea following FOLFIRI/Avastin. He reports increased diarrhea following the most recent cycle of chemotherapy. He continues Lomotil. A new prescription was called to his pharmacy. 20. "Sinus congestion". He notes good relief with Nasonex. A new prescription was sent to his pharmacy. 21. Hypokalemia 07/05/2013. He continues a potassium supplement. Potassium was in the low normal range on 08/10/2013.  Disposition-Mr. Sayed has completed 4 cycles of FOLFIRI/Avastin. He is experiencing increased diarrhea over the past 2-3 days. We decided to hold today's chemotherapy and reschedule for one week. He will return on 09/06/2013 for cycle 5.  He would like to change the treatment schedule to every 3 weeks.  We are referring him for restaging CT scans after cycle 5. He will return for a followup visit and chemotherapy on 09/27/2013. He will contact the office in the interim with any problems.    Lonna Cobb ANP/GNP-BC

## 2013-08-31 NOTE — Telephone Encounter (Signed)
gv pt appt schedule for September and October. [facility] will contact pt re ct - pt aware.

## 2013-09-07 ENCOUNTER — Other Ambulatory Visit (HOSPITAL_BASED_OUTPATIENT_CLINIC_OR_DEPARTMENT_OTHER): Payer: 59 | Admitting: Lab

## 2013-09-07 ENCOUNTER — Ambulatory Visit (HOSPITAL_BASED_OUTPATIENT_CLINIC_OR_DEPARTMENT_OTHER): Payer: 59

## 2013-09-07 VITALS — BP 147/87 | HR 95 | Temp 97.7°F

## 2013-09-07 DIAGNOSIS — C20 Malignant neoplasm of rectum: Secondary | ICD-10-CM

## 2013-09-07 DIAGNOSIS — C7800 Secondary malignant neoplasm of unspecified lung: Secondary | ICD-10-CM

## 2013-09-07 DIAGNOSIS — Z5112 Encounter for antineoplastic immunotherapy: Secondary | ICD-10-CM

## 2013-09-07 DIAGNOSIS — Z5111 Encounter for antineoplastic chemotherapy: Secondary | ICD-10-CM

## 2013-09-07 LAB — UA PROTEIN, DIPSTICK - CHCC: Protein, ur: <30 mg/dL

## 2013-09-07 LAB — CBC WITH DIFFERENTIAL/PLATELET
BASO%: 0.2 % (ref 0.0–2.0)
Eosinophils Absolute: 0.1 10*3/uL (ref 0.0–0.5)
HGB: 12.4 g/dL — ABNORMAL LOW (ref 13.0–17.1)
MCHC: 34.7 g/dL (ref 32.0–36.0)
MONO#: 0.5 10*3/uL (ref 0.1–0.9)
NEUT#: 3 10*3/uL (ref 1.5–6.5)
Platelets: 181 10*3/uL (ref 140–400)
RBC: 3.55 10*6/uL — ABNORMAL LOW (ref 4.20–5.82)
RDW: 14.8 % — ABNORMAL HIGH (ref 11.0–14.6)
WBC: 4.6 10*3/uL (ref 4.0–10.3)
lymph#: 1 10*3/uL (ref 0.9–3.3)

## 2013-09-07 LAB — COMPREHENSIVE METABOLIC PANEL (CC13)
ALT: 10 U/L (ref 0–55)
AST: 15 U/L (ref 5–34)
Albumin: 3.5 g/dL (ref 3.5–5.0)
CO2: 19 mEq/L — ABNORMAL LOW (ref 22–29)
Chloride: 114 mEq/L — ABNORMAL HIGH (ref 98–109)
Creatinine: 1.5 mg/dL — ABNORMAL HIGH (ref 0.7–1.3)
Glucose: 123 mg/dl (ref 70–140)
Potassium: 3.4 mEq/L — ABNORMAL LOW (ref 3.5–5.1)
Sodium: 144 mEq/L (ref 136–145)
Total Protein: 7 g/dL (ref 6.4–8.3)

## 2013-09-07 MED ORDER — SODIUM CHLORIDE 0.9 % IV SOLN
1920.0000 mg/m2 | INTRAVENOUS | Status: DC
  Administered 2013-09-07: 3900 mg via INTRAVENOUS
  Filled 2013-09-07: qty 78

## 2013-09-07 MED ORDER — FLUOROURACIL CHEMO INJECTION 2.5 GM/50ML
320.0000 mg/m2 | Freq: Once | INTRAVENOUS | Status: AC
  Administered 2013-09-07: 650 mg via INTRAVENOUS
  Filled 2013-09-07: qty 13

## 2013-09-07 MED ORDER — LEUCOVORIN CALCIUM INJECTION 350 MG
320.0000 mg/m2 | Freq: Once | INTRAVENOUS | Status: AC
  Administered 2013-09-07: 646 mg via INTRAVENOUS
  Filled 2013-09-07: qty 32.3

## 2013-09-07 MED ORDER — ATROPINE SULFATE 1 MG/ML IJ SOLN
0.5000 mg | Freq: Once | INTRAMUSCULAR | Status: AC | PRN
  Administered 2013-09-07: 0.5 mg via INTRAVENOUS

## 2013-09-07 MED ORDER — PALONOSETRON HCL INJECTION 0.25 MG/5ML
INTRAVENOUS | Status: AC
  Filled 2013-09-07: qty 5

## 2013-09-07 MED ORDER — SODIUM CHLORIDE 0.9 % IV SOLN
Freq: Once | INTRAVENOUS | Status: AC
  Administered 2013-09-07: 09:00:00 via INTRAVENOUS

## 2013-09-07 MED ORDER — ATROPINE SULFATE 1 MG/ML IJ SOLN
INTRAMUSCULAR | Status: AC
  Filled 2013-09-07: qty 1

## 2013-09-07 MED ORDER — SODIUM CHLORIDE 0.9 % IV SOLN
5.0000 mg/kg | Freq: Once | INTRAVENOUS | Status: AC
  Administered 2013-09-07: 425 mg via INTRAVENOUS
  Filled 2013-09-07: qty 17

## 2013-09-07 MED ORDER — IRINOTECAN HCL CHEMO INJECTION 100 MG/5ML
144.0000 mg/m2 | Freq: Once | INTRAVENOUS | Status: AC
  Administered 2013-09-07: 290 mg via INTRAVENOUS
  Filled 2013-09-07: qty 14.5

## 2013-09-07 MED ORDER — DEXAMETHASONE SODIUM PHOSPHATE 20 MG/5ML IJ SOLN
20.0000 mg | Freq: Once | INTRAMUSCULAR | Status: AC
  Administered 2013-09-07: 20 mg via INTRAVENOUS

## 2013-09-07 MED ORDER — DEXAMETHASONE SODIUM PHOSPHATE 20 MG/5ML IJ SOLN
INTRAMUSCULAR | Status: AC
  Filled 2013-09-07: qty 5

## 2013-09-07 MED ORDER — PALONOSETRON HCL INJECTION 0.25 MG/5ML
0.2500 mg | Freq: Once | INTRAVENOUS | Status: AC
  Administered 2013-09-07: 0.25 mg via INTRAVENOUS

## 2013-09-07 NOTE — Patient Instructions (Addendum)
[facility] Discharge Instructions for Patients Receiving Chemotherapy  Today you received the following chemotherapy agents:  Avastin, Camptosar, Leucovorin and 5-FU.  To help prevent nausea and vomiting after your treatment, we encourage you to take your nausea medication as ordered per MD.   If you develop nausea and vomiting that is not controlled by your nausea medication, call the clinic.   BELOW ARE SYMPTOMS THAT SHOULD BE REPORTED IMMEDIATELY:  *FEVER GREATER THAN 100.5 F  *CHILLS WITH OR WITHOUT FEVER  NAUSEA AND VOMITING THAT IS NOT CONTROLLED WITH YOUR NAUSEA MEDICATION  *UNUSUAL SHORTNESS OF BREATH  *UNUSUAL BRUISING OR BLEEDING  TENDERNESS IN MOUTH AND THROAT WITH OR WITHOUT PRESENCE OF ULCERS  *URINARY PROBLEMS  *BOWEL PROBLEMS  UNUSUAL RASH Items with * indicate a potential emergency and should be followed up as soon as possible.  Feel free to call the clinic you have any questions or concerns. The clinic phone number is [phone].    

## 2013-09-09 ENCOUNTER — Ambulatory Visit (HOSPITAL_BASED_OUTPATIENT_CLINIC_OR_DEPARTMENT_OTHER): Payer: 59

## 2013-09-09 VITALS — BP 142/81 | HR 92 | Temp 97.6°F

## 2013-09-09 DIAGNOSIS — C7800 Secondary malignant neoplasm of unspecified lung: Secondary | ICD-10-CM

## 2013-09-09 DIAGNOSIS — Z452 Encounter for adjustment and management of vascular access device: Secondary | ICD-10-CM

## 2013-09-09 DIAGNOSIS — C20 Malignant neoplasm of rectum: Secondary | ICD-10-CM

## 2013-09-09 MED ORDER — HEPARIN SOD (PORK) LOCK FLUSH 100 UNIT/ML IV SOLN
500.0000 [IU] | Freq: Once | INTRAVENOUS | Status: AC | PRN
  Administered 2013-09-09: 500 [IU]
  Filled 2013-09-09: qty 5

## 2013-09-09 MED ORDER — SODIUM CHLORIDE 0.9 % IJ SOLN
10.0000 mL | INTRAMUSCULAR | Status: DC | PRN
  Administered 2013-09-09: 10 mL
  Filled 2013-09-09: qty 10

## 2013-09-13 ENCOUNTER — Ambulatory Visit: Payer: 59

## 2013-09-18 ENCOUNTER — Other Ambulatory Visit: Payer: Self-pay | Admitting: Oncology

## 2013-09-23 ENCOUNTER — Ambulatory Visit (HOSPITAL_COMMUNITY)
Admission: RE | Admit: 2013-09-23 | Discharge: 2013-09-23 | Disposition: A | Discharge status: Home / Self Care | Payer: 59 | Source: Ambulatory Visit | Attending: Nurse Practitioner | Admitting: Nurse Practitioner

## 2013-09-23 ENCOUNTER — Other Ambulatory Visit (HOSPITAL_BASED_OUTPATIENT_CLINIC_OR_DEPARTMENT_OTHER): Payer: 59 | Admitting: Lab

## 2013-09-23 ENCOUNTER — Encounter (INDEPENDENT_AMBULATORY_CARE_PROVIDER_SITE_OTHER): Payer: Self-pay

## 2013-09-23 ENCOUNTER — Encounter (HOSPITAL_COMMUNITY): Payer: Self-pay

## 2013-09-23 DIAGNOSIS — R222 Localized swelling, mass and lump, trunk: Secondary | ICD-10-CM | POA: Insufficient documentation

## 2013-09-23 DIAGNOSIS — C20 Malignant neoplasm of rectum: Secondary | ICD-10-CM

## 2013-09-23 DIAGNOSIS — R229 Localized swelling, mass and lump, unspecified: Secondary | ICD-10-CM | POA: Insufficient documentation

## 2013-09-23 DIAGNOSIS — K7689 Other specified diseases of liver: Secondary | ICD-10-CM | POA: Insufficient documentation

## 2013-09-23 DIAGNOSIS — J438 Other emphysema: Secondary | ICD-10-CM | POA: Insufficient documentation

## 2013-09-23 DIAGNOSIS — C7800 Secondary malignant neoplasm of unspecified lung: Secondary | ICD-10-CM

## 2013-09-23 DIAGNOSIS — N201 Calculus of ureter: Secondary | ICD-10-CM | POA: Insufficient documentation

## 2013-09-23 DIAGNOSIS — R918 Other nonspecific abnormal finding of lung field: Secondary | ICD-10-CM | POA: Insufficient documentation

## 2013-09-23 DIAGNOSIS — N1330 Unspecified hydronephrosis: Secondary | ICD-10-CM | POA: Insufficient documentation

## 2013-09-23 DIAGNOSIS — I359 Nonrheumatic aortic valve disorder, unspecified: Secondary | ICD-10-CM | POA: Insufficient documentation

## 2013-09-23 LAB — CBC WITH DIFFERENTIAL/PLATELET
Basophils Absolute: 0 10*3/uL (ref 0.0–0.1)
EOS%: 4.2 % (ref 0.0–7.0)
HCT: 37.6 % — ABNORMAL LOW (ref 38.4–49.9)
HGB: 12.6 g/dL — ABNORMAL LOW (ref 13.0–17.1)
MCH: 34.9 pg — ABNORMAL HIGH (ref 27.2–33.4)
MCV: 103.7 fL — ABNORMAL HIGH (ref 79.3–98.0)
MONO#: 0.5 10*3/uL (ref 0.1–0.9)
MONO%: 13.5 % (ref 0.0–14.0)
NEUT#: 1.9 10*3/uL (ref 1.5–6.5)
NEUT%: 55.1 % (ref 39.0–75.0)
Platelets: 175 10*3/uL (ref 140–400)
RBC: 3.63 10*6/uL — ABNORMAL LOW (ref 4.20–5.82)
RDW: 14.9 % — ABNORMAL HIGH (ref 11.0–14.6)
WBC: 3.4 10*3/uL — ABNORMAL LOW (ref 4.0–10.3)
lymph#: 0.9 10*3/uL (ref 0.9–3.3)

## 2013-09-23 LAB — COMPREHENSIVE METABOLIC PANEL (CC13)
ALT: 10 U/L (ref 0–55)
AST: 13 U/L (ref 5–34)
Albumin: 3.4 g/dL — ABNORMAL LOW (ref 3.5–5.0)
Alkaline Phosphatase: 128 U/L (ref 40–150)
Chloride: 112 mEq/L — ABNORMAL HIGH (ref 98–109)
Creatinine: 1.5 mg/dL — ABNORMAL HIGH (ref 0.7–1.3)
Potassium: 3.6 mEq/L (ref 3.5–5.1)
Sodium: 143 mEq/L (ref 136–145)
Total Protein: 6.9 g/dL (ref 6.4–8.3)

## 2013-09-23 MED ORDER — IOHEXOL 300 MG/ML  SOLN
100.0000 mL | Freq: Once | INTRAMUSCULAR | Status: AC | PRN
  Administered 2013-09-23: 100 mL via INTRAVENOUS

## 2013-09-27 ENCOUNTER — Ambulatory Visit (HOSPITAL_BASED_OUTPATIENT_CLINIC_OR_DEPARTMENT_OTHER): Payer: 59

## 2013-09-27 ENCOUNTER — Telehealth: Payer: Self-pay | Admitting: *Deleted

## 2013-09-27 ENCOUNTER — Ambulatory Visit (HOSPITAL_BASED_OUTPATIENT_CLINIC_OR_DEPARTMENT_OTHER): Payer: 59 | Admitting: Oncology

## 2013-09-27 ENCOUNTER — Other Ambulatory Visit (HOSPITAL_BASED_OUTPATIENT_CLINIC_OR_DEPARTMENT_OTHER): Payer: 59 | Admitting: Lab

## 2013-09-27 ENCOUNTER — Other Ambulatory Visit: Payer: Self-pay | Admitting: *Deleted

## 2013-09-27 VITALS — BP 129/87 | HR 97 | Temp 97.9°F | Resp 18 | Ht 70.0 in | Wt 178.4 lb

## 2013-09-27 DIAGNOSIS — E876 Hypokalemia: Secondary | ICD-10-CM

## 2013-09-27 DIAGNOSIS — C20 Malignant neoplasm of rectum: Secondary | ICD-10-CM

## 2013-09-27 DIAGNOSIS — C7800 Secondary malignant neoplasm of unspecified lung: Secondary | ICD-10-CM

## 2013-09-27 DIAGNOSIS — R197 Diarrhea, unspecified: Secondary | ICD-10-CM

## 2013-09-27 DIAGNOSIS — Z5111 Encounter for antineoplastic chemotherapy: Secondary | ICD-10-CM

## 2013-09-27 DIAGNOSIS — C787 Secondary malignant neoplasm of liver and intrahepatic bile duct: Secondary | ICD-10-CM

## 2013-09-27 DIAGNOSIS — C779 Secondary and unspecified malignant neoplasm of lymph node, unspecified: Secondary | ICD-10-CM

## 2013-09-27 DIAGNOSIS — Z5112 Encounter for antineoplastic immunotherapy: Secondary | ICD-10-CM

## 2013-09-27 LAB — CBC WITH DIFFERENTIAL/PLATELET
Basophils Absolute: 0 10*3/uL (ref 0.0–0.1)
Eosinophils Absolute: 0.1 10*3/uL (ref 0.0–0.5)
LYMPH%: 25.7 % (ref 14.0–49.0)
MCV: 103.4 fL — ABNORMAL HIGH (ref 79.3–98.0)
MONO%: 15.7 % — ABNORMAL HIGH (ref 0.0–14.0)
NEUT#: 2.2 10*3/uL (ref 1.5–6.5)
Platelets: 197 10*3/uL (ref 140–400)
RBC: 3.78 10*6/uL — ABNORMAL LOW (ref 4.20–5.82)
lymph#: 1 10*3/uL (ref 0.9–3.3)

## 2013-09-27 LAB — UA PROTEIN, DIPSTICK - CHCC: Protein, ur: 30 mg/dL

## 2013-09-27 MED ORDER — DEXAMETHASONE SODIUM PHOSPHATE 20 MG/5ML IJ SOLN
20.0000 mg | Freq: Once | INTRAMUSCULAR | Status: AC
  Administered 2013-09-27: 20 mg via INTRAVENOUS

## 2013-09-27 MED ORDER — LEUCOVORIN CALCIUM INJECTION 350 MG
320.0000 mg/m2 | Freq: Once | INTRAVENOUS | Status: AC
  Administered 2013-09-27: 646 mg via INTRAVENOUS
  Filled 2013-09-27: qty 32.3

## 2013-09-27 MED ORDER — HEPARIN SOD (PORK) LOCK FLUSH 100 UNIT/ML IV SOLN
500.0000 [IU] | Freq: Once | INTRAVENOUS | Status: DC | PRN
  Filled 2013-09-27: qty 5

## 2013-09-27 MED ORDER — ATROPINE SULFATE 1 MG/ML IJ SOLN
0.5000 mg | Freq: Once | INTRAMUSCULAR | Status: AC | PRN
  Administered 2013-09-27: 0.5 mg via INTRAVENOUS

## 2013-09-27 MED ORDER — DIPHENOXYLATE-ATROPINE 2.5-0.025 MG PO TABS: 1.0000 | ORAL_TABLET | Freq: Four times a day (QID) | ORAL | Status: AC | PRN

## 2013-09-27 MED ORDER — SODIUM CHLORIDE 0.9 % IJ SOLN
10.0000 mL | INTRAMUSCULAR | Status: DC | PRN
  Filled 2013-09-27: qty 10

## 2013-09-27 MED ORDER — FLUOROURACIL CHEMO INJECTION 2.5 GM/50ML
320.0000 mg/m2 | Freq: Once | INTRAVENOUS | Status: AC
  Administered 2013-09-27: 650 mg via INTRAVENOUS
  Filled 2013-09-27: qty 13

## 2013-09-27 MED ORDER — ATROPINE SULFATE 1 MG/ML IJ SOLN
INTRAMUSCULAR | Status: AC
  Filled 2013-09-27: qty 1

## 2013-09-27 MED ORDER — PALONOSETRON HCL INJECTION 0.25 MG/5ML
INTRAVENOUS | Status: AC
  Filled 2013-09-27: qty 5

## 2013-09-27 MED ORDER — SODIUM CHLORIDE 0.9 % IV SOLN
1920.0000 mg/m2 | INTRAVENOUS | Status: DC
  Administered 2013-09-27: 3900 mg via INTRAVENOUS
  Filled 2013-09-27: qty 78

## 2013-09-27 MED ORDER — IRINOTECAN HCL CHEMO INJECTION 100 MG/5ML
144.0000 mg/m2 | Freq: Once | INTRAVENOUS | Status: AC
  Administered 2013-09-27: 290 mg via INTRAVENOUS
  Filled 2013-09-27: qty 14.5

## 2013-09-27 MED ORDER — SODIUM CHLORIDE 0.9 % IV SOLN
Freq: Once | INTRAVENOUS | Status: AC
  Administered 2013-09-27: 12:00:00 via INTRAVENOUS

## 2013-09-27 MED ORDER — DEXAMETHASONE SODIUM PHOSPHATE 20 MG/5ML IJ SOLN
INTRAMUSCULAR | Status: AC
  Filled 2013-09-27: qty 5

## 2013-09-27 MED ORDER — PALONOSETRON HCL INJECTION 0.25 MG/5ML
0.2500 mg | Freq: Once | INTRAVENOUS | Status: AC
  Administered 2013-09-27: 0.25 mg via INTRAVENOUS

## 2013-09-27 MED ORDER — SODIUM CHLORIDE 0.9 % IV SOLN
5.0000 mg/kg | Freq: Once | INTRAVENOUS | Status: AC
  Administered 2013-09-27: 425 mg via INTRAVENOUS
  Filled 2013-09-27: qty 17

## 2013-09-27 NOTE — Telephone Encounter (Signed)
Per staff message and POF I have scheduled appts.  JMW  

## 2013-09-27 NOTE — Patient Instructions (Signed)
[facility] Discharge Instructions for Patients Receiving Chemotherapy  Today you received the following chemotherapy agents Avastin/5 FU/Leucovorin/Irinotecan  To help prevent nausea and vomiting after your treatment, we encourage you to take your nausea medication as prescribed.  If you develop nausea and vomiting that is not controlled by your nausea medication, call the clinic.   BELOW ARE SYMPTOMS THAT SHOULD BE REPORTED IMMEDIATELY:  *FEVER GREATER THAN 100.5 F  *CHILLS WITH OR WITHOUT FEVER  NAUSEA AND VOMITING THAT IS NOT CONTROLLED WITH YOUR NAUSEA MEDICATION  *UNUSUAL SHORTNESS OF BREATH  *UNUSUAL BRUISING OR BLEEDING  TENDERNESS IN MOUTH AND THROAT WITH OR WITHOUT PRESENCE OF ULCERS  *URINARY PROBLEMS  *BOWEL PROBLEMS  UNUSUAL RASH Items with * indicate a potential emergency and should be followed up as soon as possible.  Feel free to call the clinic you have any questions or concerns. The clinic phone number is [phone].    

## 2013-09-27 NOTE — Telephone Encounter (Signed)
Lomotil Rx faxed to [facility].

## 2013-09-27 NOTE — Progress Notes (Signed)
[facility]    OFFICE PROGRESS NOTE   INTERVAL HISTORY:   [name] [name] returns for scheduled followup of metastatic rectal cancer. He completed another cycle of FOLFIRI/Avastin on 09/07/2013. He continues to have intermittent diarrhea. The diarrhea is relieved with Lomotil. He continues to work. No symptoms of thrombosis. No bleeding. No nausea or vomiting.  Objective:  Vital signs in last 24 hours:  Blood pressure 129/87, pulse 97, temperature 97.9 F (36.6 C), temperature source Oral, resp. rate 18, height 5\' 10"  (1.778 m), weight 178 lb 6.4 oz (80.922 kg).    HEENT: No thrush or ulcers Lymphatics: No cervical, supraclavicular, axillary, or inguinal nodes Resp: Lungs clear bilaterally Cardio: Regular rate and rhythm GI: No hepatomegaly, nontender Vascular: No leg edema  Portacath/PICC-without erythema  Lab Results:  Lab Results  Component Value Date   WBC 3.9* 09/27/2013   HGB 13.4 09/27/2013   HCT 39.1 09/27/2013   MCV 103.4* 09/27/2013   PLT 197 09/27/2013   ANC 2.2 Potassium 3.6  CEA on 09/23/2013-10.8  X-rays: Restaging CTs of the chest, abdomen, and pelvis on 09/23/2013, compared to 06/07/2013-no change in the lung lesions, no new lung nodules. The metastatic liver lesions have decreased in size. No new lesions.    Medications: I have reviewed the patient's current medications.  Assessment/Plan: 1. Metastatic rectal cancer status post biopsy of rectal mass 09/03/2011 with pathology showing invasive adenocarcinoma. Staging CT scans 09/03/2011 showed a 10 mm hypodense rounded lesion in left lateral hepatic lobe, similar 6 mm lesion in the superior right hepatic lobe and a smaller subcapsular lesion in the lateral right hepatic lobe; rectal mass within the lumen of the bowel emanating from the left wall measuring 4.9 x 3.5 cm and a mass within the low left perirectal fat measuring 2.3 x 3.2 cm consistent with local extension of carcinoma. Staging  PET scan 09/18/2011 showed a 5.1 cm distal sigmoid/rectal mass with max SUV 19.2 with local extension into the left perirectal fat with max SUV 7.7. At least 2 suspected hepatic metastases with max SUV 6.7 in the lateral segment left hepatic lobe and max SUV 4.1 in the lateral right hepatic dome. Additional tiny hypodensities in the liver may be cysts; patchy/nodular opacity in the left lower lobe with max SUV 3.5 worrisome for pulmonary metastasis, less likely infectious. He began radiation and concurrent Xeloda chemotherapy on 09/29/2011. He completed radiation on 11/06/2011. MRI of the liver on 12/08/2011 showed 2 hypermetabolic liver lesions, similar in size to 09/03/2011. 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 Equivocal 7 mm lesion in the inferior right hepatic lobe. No evidence of extrahepatic metastasis within the abdomen. An ultrasound-guided biopsy of a small left hepatic lesion on 12/23/2011 was nondiagnostic. He underwent a low anterior resection with loop ileostomy on 02/06/2012 with final pathology showing a 3.5 cm invasive adenocarcinoma with abundant extracellular mucin invading through the muscularis propria into pericolonic fatty tissue; 4 of 10 pericolonic lymph nodes were positive for metastatic carcinoma; resection margins were clear (ypT3,ypN2). He began FOLFOX chemotherapy on 03/16/2012. He completed 2 cycles of FOLFOX chemotherapy and underwent an ileostomy reversal 05/05/2012. FOLFOX was resumed on 06/14/2012 and completed on 09/14/2012. 2. CEA 13.8 on 09/17/2011; 1.6 on 12/01/2011; 8.4 on 03/09/2012, 2.1 on 05/03/2012; 2.5 on 07/26/2012; 1.8 on 10/12/2012. 3.0 on 11/23/2012. CEA elevated at 25.4 on 02/22/2013, 44.7 on 04/04/2013, and 59.1 on 05/17/2013. CEA improved at 16.8 on 08/10/2013 and 10.8 on 09/23/2013 3.  CT chest/abdomen/pelvis 03/10/2013 with 2 new left lower lobe nodules and 2 liver lesions increased in size.  Progressive metastatic disease in the lungs and liver on a restaging CT 06/07/2013. He began treatment with FOLFIRI/Avastin on 06/21/2013.  Restaging CT 09/23/2013 after 5 cycles of FOLFIRI/Avastin, with stable lung lesions and a decrease in the size of dominant liver lesions 4. K-ras mutation detected. 5. Rectal pain and bleeding secondary to #1. Resolved. 6. Frequent loose stools secondary to #1. Improved. 7. Hypertension. 8. Tobacco use. 9. CT scan 09/03/2011 with chronic right hydronephrosis secondary to an obstructing calculus in the mid right ureter. He is followed by Dr. Brunilda Payor. Stable on the CT 06/07/2013 10. Status post cystoscopy, right retrograde pyelogram, ureteroscopy and insertion of right double-J stent 11/28/2011. The double-J stent was replaced 02/06/2012. The stent has been removed. 11. Elevated BUN/creatinine. Likely secondary to dehydration while the ileostomy was in place. He now appears to have chronic renal insufficiency. 12. Status post ultrasound-guided fine-needle aspiration of small left hepatic lesion 12/22/2011. The pathology revealed no malignancy. 13. Malaise and weight loss. Resolved. 14. High output ileostomy. Partially improved with Imodium. Status post ileostomy reversal 05/05/2012. 15. Erectile dysfunction. He is followed by Dr. Brunilda Payor. 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 17. Diarrhea after FOLFOX chemotherapy given on 07/26/2012-resolved. The bolus and infusional 5-FU were dose reduced by 20% beginning with FOLFOX on 08/17/2012.  18. History of an Irregular heart rate- Status post evaluation by Dr. Clent Ridges on 11/02/2012 with findings of sinus rhythm with occasional PACs. 19. Diarrhea following FOLFIRI/Avastin. He continues to have diarrhea that is worse following chemotherapy. She reports the diarrhea is relieved with Lomotil the 20. "Sinus congestion". He notes good relief with Nasonex. 21. Hypokalemia 07/05/2013. He continues a  potassium supplement.     Disposition:  [name] [name] appears stable. The restaging CT confirms improvement in the liver lesions and the CEA is lower. The plan is to continue FOLFIRI/Avastin. He appears to be tolerating the chemotherapy well. He will continue Lomotil for the diarrhea.  [name] [name] would like to continue chemotherapy on a 3 week schedule. He will return for an office visit and chemotherapy in 3 weeks.   Thornton Papas, MD  09/27/2013  9:36 AM

## 2013-09-29 ENCOUNTER — Ambulatory Visit (HOSPITAL_BASED_OUTPATIENT_CLINIC_OR_DEPARTMENT_OTHER): Payer: 59

## 2013-09-29 VITALS — BP 159/89 | HR 95 | Temp 98.4°F | Resp 20

## 2013-09-29 DIAGNOSIS — C20 Malignant neoplasm of rectum: Secondary | ICD-10-CM

## 2013-09-29 MED ORDER — HEPARIN SOD (PORK) LOCK FLUSH 100 UNIT/ML IV SOLN
500.0000 [IU] | Freq: Once | INTRAVENOUS | Status: AC | PRN
  Administered 2013-09-29: 500 [IU]
  Filled 2013-09-29: qty 5

## 2013-09-29 MED ORDER — SODIUM CHLORIDE 0.9 % IJ SOLN
10.0000 mL | INTRAMUSCULAR | Status: DC | PRN
  Administered 2013-09-29: 10 mL
  Filled 2013-09-29: qty 10

## 2013-10-10 ENCOUNTER — Telehealth: Payer: Self-pay | Admitting: *Deleted

## 2013-10-10 MED ORDER — MAGIC MOUTHWASH: 10.0000 mL | Freq: Four times a day (QID) | ORAL | Status: DC | PRN

## 2013-10-10 NOTE — Telephone Encounter (Signed)
Call from pt reporting his mouth feels tender. Noticed this Fri. Does not interfere with eating or drinking. Hasn't noticed any sores. Reviewed with Misty Stanley, NP. Order received for magic mouthwash. Called pt with instructions, he voiced understanding.

## 2013-10-17 ENCOUNTER — Other Ambulatory Visit: Payer: Self-pay | Admitting: Oncology

## 2013-10-18 ENCOUNTER — Other Ambulatory Visit (HOSPITAL_BASED_OUTPATIENT_CLINIC_OR_DEPARTMENT_OTHER): Payer: 59 | Admitting: Lab

## 2013-10-18 ENCOUNTER — Telehealth: Payer: Self-pay | Admitting: *Deleted

## 2013-10-18 ENCOUNTER — Telehealth: Payer: Self-pay | Admitting: Nurse Practitioner

## 2013-10-18 ENCOUNTER — Ambulatory Visit (HOSPITAL_BASED_OUTPATIENT_CLINIC_OR_DEPARTMENT_OTHER): Payer: 59

## 2013-10-18 ENCOUNTER — Telehealth: Payer: Self-pay | Admitting: Oncology

## 2013-10-18 ENCOUNTER — Other Ambulatory Visit: Payer: Self-pay | Admitting: *Deleted

## 2013-10-18 ENCOUNTER — Ambulatory Visit (HOSPITAL_BASED_OUTPATIENT_CLINIC_OR_DEPARTMENT_OTHER): Payer: 59 | Admitting: Nurse Practitioner

## 2013-10-18 VITALS — BP 132/58 | HR 74 | Resp 18

## 2013-10-18 VITALS — BP 130/67 | HR 86 | Temp 97.6°F | Resp 18 | Ht 70.0 in | Wt 177.5 lb

## 2013-10-18 DIAGNOSIS — C779 Secondary and unspecified malignant neoplasm of lymph node, unspecified: Secondary | ICD-10-CM

## 2013-10-18 DIAGNOSIS — C20 Malignant neoplasm of rectum: Secondary | ICD-10-CM

## 2013-10-18 DIAGNOSIS — C787 Secondary malignant neoplasm of liver and intrahepatic bile duct: Secondary | ICD-10-CM

## 2013-10-18 DIAGNOSIS — C7800 Secondary malignant neoplasm of unspecified lung: Secondary | ICD-10-CM

## 2013-10-18 DIAGNOSIS — Z5112 Encounter for antineoplastic immunotherapy: Secondary | ICD-10-CM

## 2013-10-18 DIAGNOSIS — Z5111 Encounter for antineoplastic chemotherapy: Secondary | ICD-10-CM

## 2013-10-18 DIAGNOSIS — F17200 Nicotine dependence, unspecified, uncomplicated: Secondary | ICD-10-CM

## 2013-10-18 DIAGNOSIS — E876 Hypokalemia: Secondary | ICD-10-CM

## 2013-10-18 DIAGNOSIS — B370 Candidal stomatitis: Secondary | ICD-10-CM

## 2013-10-18 DIAGNOSIS — R944 Abnormal results of kidney function studies: Secondary | ICD-10-CM

## 2013-10-18 LAB — CBC WITH DIFFERENTIAL/PLATELET
BASO%: 0.4 % (ref 0.0–2.0)
EOS%: 0.6 % (ref 0.0–7.0)
HCT: 35.6 % — ABNORMAL LOW (ref 38.4–49.9)
HGB: 12.2 g/dL — ABNORMAL LOW (ref 13.0–17.1)
MCH: 35.3 pg — ABNORMAL HIGH (ref 27.2–33.4)
MCHC: 34.2 g/dL (ref 32.0–36.0)
MCV: 103.2 fL — ABNORMAL HIGH (ref 79.3–98.0)
MONO#: 0.8 10*3/uL (ref 0.1–0.9)
NEUT%: 79.1 % — ABNORMAL HIGH (ref 39.0–75.0)
RDW: 14.6 % (ref 11.0–14.6)
WBC: 8 10*3/uL (ref 4.0–10.3)
lymph#: 0.8 10*3/uL — ABNORMAL LOW (ref 0.9–3.3)

## 2013-10-18 LAB — COMPREHENSIVE METABOLIC PANEL (CC13)
ALT: 11 U/L (ref 0–55)
AST: 14 U/L (ref 5–34)
Albumin: 3.1 g/dL — ABNORMAL LOW (ref 3.5–5.0)
Anion Gap: 12 mEq/L — ABNORMAL HIGH (ref 3–11)
CO2: 20 mEq/L — ABNORMAL LOW (ref 22–29)
Calcium: 9.1 mg/dL (ref 8.4–10.4)
Chloride: 110 mEq/L — ABNORMAL HIGH (ref 98–109)
Creatinine: 1.7 mg/dL — ABNORMAL HIGH (ref 0.7–1.3)
Potassium: 3.2 mEq/L — ABNORMAL LOW (ref 3.5–5.1)
Total Protein: 6.9 g/dL (ref 6.4–8.3)

## 2013-10-18 LAB — UA PROTEIN, DIPSTICK - CHCC: Protein, ur: 30 mg/dL

## 2013-10-18 MED ORDER — SODIUM CHLORIDE 0.9 % IV SOLN
1920.0000 mg/m2 | INTRAVENOUS | Status: DC
  Administered 2013-10-18: 3900 mg via INTRAVENOUS
  Filled 2013-10-18: qty 78

## 2013-10-18 MED ORDER — LEUCOVORIN CALCIUM INJECTION 350 MG
650.0000 mg | Freq: Once | INTRAVENOUS | Status: AC
  Administered 2013-10-18: 650 mg via INTRAVENOUS
  Filled 2013-10-18: qty 32.5

## 2013-10-18 MED ORDER — FLUOROURACIL CHEMO INJECTION 2.5 GM/50ML
320.0000 mg/m2 | Freq: Once | INTRAVENOUS | Status: AC
  Administered 2013-10-18: 650 mg via INTRAVENOUS
  Filled 2013-10-18: qty 13

## 2013-10-18 MED ORDER — DEXAMETHASONE SODIUM PHOSPHATE 20 MG/5ML IJ SOLN
INTRAMUSCULAR | Status: AC
  Filled 2013-10-18: qty 5

## 2013-10-18 MED ORDER — IRINOTECAN HCL CHEMO INJECTION 100 MG/5ML
144.0000 mg/m2 | Freq: Once | INTRAVENOUS | Status: AC
  Administered 2013-10-18: 290 mg via INTRAVENOUS
  Filled 2013-10-18: qty 14.5

## 2013-10-18 MED ORDER — SODIUM CHLORIDE 0.9 % IV SOLN
5.0000 mg/kg | Freq: Once | INTRAVENOUS | Status: AC
  Administered 2013-10-18: 425 mg via INTRAVENOUS
  Filled 2013-10-18: qty 17

## 2013-10-18 MED ORDER — PALONOSETRON HCL INJECTION 0.25 MG/5ML
0.2500 mg | Freq: Once | INTRAVENOUS | Status: AC
  Administered 2013-10-18: 0.25 mg via INTRAVENOUS

## 2013-10-18 MED ORDER — FLUCONAZOLE 100 MG PO TABS
100.0000 mg | ORAL_TABLET | Freq: Every day | ORAL | Status: DC
Start: 2013-10-18 — End: 2013-12-05

## 2013-10-18 MED ORDER — ATROPINE SULFATE 1 MG/ML IJ SOLN
INTRAMUSCULAR | Status: AC
  Filled 2013-10-18: qty 1

## 2013-10-18 MED ORDER — PALONOSETRON HCL INJECTION 0.25 MG/5ML
INTRAVENOUS | Status: AC
  Filled 2013-10-18: qty 5

## 2013-10-18 MED ORDER — ATROPINE SULFATE 1 MG/ML IJ SOLN
0.5000 mg | Freq: Once | INTRAMUSCULAR | Status: AC | PRN
  Administered 2013-10-18: 0.5 mg via INTRAVENOUS

## 2013-10-18 MED ORDER — DEXAMETHASONE SODIUM PHOSPHATE 20 MG/5ML IJ SOLN
20.0000 mg | Freq: Once | INTRAMUSCULAR | Status: AC
  Administered 2013-10-18: 20 mg via INTRAVENOUS

## 2013-10-18 MED ORDER — SODIUM CHLORIDE 0.9 % IV SOLN
Freq: Once | INTRAVENOUS | Status: AC
  Administered 2013-10-18: 14:00:00 via INTRAVENOUS

## 2013-10-18 NOTE — Telephone Encounter (Signed)
per Lonna Cobb' email response moved lab and ov to 12/3 Had to email Misty Stanley again to see if ok to move chemo from 12/1 to 12/3 shh

## 2013-10-18 NOTE — Telephone Encounter (Signed)
Per staff message and POF I have scheduled appts.  JMW  

## 2013-10-18 NOTE — Progress Notes (Signed)
OFFICE PROGRESS NOTE  Interval history:  Mr. Mele returns for followup of metastatic rectal cancer. He completed cycle 6 FOLFIRI/Avastin on 09/27/2013. He had nausea with one episode of vomiting the day after chemotherapy. No further problems with nausea. He noted less diarrhea following cycle 6. His mouth became "tender". He did not develop any discrete ulcers. He denies bleeding. No abdominal pain. He denies shortness of breath and chest pain. He has had a cough the past few days as well as other "cold" symptoms. He denies fever. No leg swelling or calf pain.   Objective: Blood pressure 130/67, pulse 86, temperature 97.6 F (36.4 C), temperature source Oral, resp. rate 18, height 5\' 10"  (1.778 m), weight 177 lb 8 oz (80.513 kg).  Early thrush bilateral buccal regions. No ulcerations. Lungs clear. No wheezes or rales. Regular cardiac rhythm. Port-A-Cath site is without erythema. Abdomen is soft and nontender. No hepatomegaly. Extremities without edema. Calves soft and nontender.  Lab Results: Lab Results  Component Value Date   WBC 8.0 10/18/2013   HGB 12.2* 10/18/2013   HCT 35.6* 10/18/2013   MCV 103.2* 10/18/2013   PLT 178 10/18/2013    Chemistry:    Chemistry      Component Value Date/Time   NA 143 09/23/2013 0811   NA 138 04/04/2013 1650   K 3.6 09/23/2013 0811   K 4.0 04/04/2013 1650   CL 109* 05/16/2013 0829   CL 105 04/04/2013 1650   CO2 21* 09/23/2013 0811   CO2 24 04/04/2013 1650   BUN 24.2 09/23/2013 0811   BUN 28* 04/04/2013 1650   CREATININE 1.5* 09/23/2013 0811   CREATININE 1.48* 04/04/2013 1650      Component Value Date/Time   CALCIUM 8.8 09/23/2013 0811   CALCIUM 9.6 04/04/2013 1650   ALKPHOS 128 09/23/2013 0811   ALKPHOS 99 07/26/2012 1008   AST 13 09/23/2013 0811   AST 13 07/26/2012 1008   ALT 10 09/23/2013 0811   ALT 10 07/26/2012 1008   BILITOT 0.66 09/23/2013 0811   BILITOT 0.5 07/26/2012 1008       Studies/Results: Ct Chest W Contrast  09/23/2013    CLINICAL DATA:  History of metastatic rectal cancer. Restaging examination.  EXAM: CT CHEST, ABDOMEN, AND PELVIS WITH CONTRAST  TECHNIQUE: Multidetector CT imaging of the chest, abdomen and pelvis was performed following the standard protocol during bolus administration of intravenous contrast.  CONTRAST:  OMNIPAQUE IOHEXOL 300 MG/ML  SOLN  COMPARISON:  CT of the chest abdomen and pelvis 06/07/2013.  FINDINGS: CT CHEST FINDINGS  Mediastinum: Left-sided subclavian single-lumen porta cath with tip terminating at the superior cavoatrial junction. Heart size is normal. There is no significant pericardial fluid, thickening or pericardial calcification. No pathologically enlarged mediastinal or hilar lymph nodes. Mild calcifications of the aortic valve. Esophagus is unremarkable in appearance.  Lungs/Pleura: Previously noted mass in the perihilar aspect of the superior segment of the left lower lobe is unchanged in size measuring 3.5 x 2.5 cm on image 29 of series 5 of today's examination. Likewise, and a pleural-based nodule in in the medial aspect of the left lower lobe (image 54 of series 5) is also unchanged measuring 2.3 x 1.6 cm. Previously noted nodule in the periphery of the right upper lobe (image 18 of series 5) is similar in appearance measuring roughly 4 mm. No definite new nodules or masses are otherwise noted. Mild diffuse bronchial wall thickening. Mild centrilobular emphysema. No acute consolidative airspace disease. No pleural effusions.  Musculoskeletal:  There are no aggressive appearing lytic or blastic lesions noted in the visualized portions of the skeleton.  CT ABDOMEN AND PELVIS FINDINGS  Abdomen/Pelvis: Again noted are multiple well-defined low-attenuation hepatic lesions which are subcentimeter in size, the majority of which are favored to represent small cysts or biliary hamartomas. There are several larger lesions which are more ill-defined and hypovascular, compatible with known  metastatic lesions. The largest of these is in segment 2 of the liver, and has decreased in size compared to the prior study, currently measuring 4.8 x 2.8 cm (image 55 of series 2), decreased compared to 5.9 x 3.9 cm on the prior exam. Other prominent lesion near the dome of the liver in segment 8 (image 51 of series 2) currently measures 2.8 x 1.9 cm (previously 3.6 x 2.5 cm). No new suspicious hepatic lesions are otherwise noted.  The appearance of the gallbladder, pancreas, spleen and bilateral adrenal glands is unremarkable. 2 mm calcification in the interpolar region of the left kidney is likely a nonobstructive calculus. Image 92 of series 2 demonstrates a 6 mm calculus in the proximal 3rd of the right ureter, proximal to which there is moderate to severe right hydroureteronephrosis. Multiple additional calculi are seen dependently in the more proximal aspect of the right ureter, as well as within the right renal collecting system. The renal parenchyma in the right kidney is markedly thinned in multiple regions, compatible with scarring. Generalized right renal atrophy is also noted. Overall, the appearance of the right kidney, ureter and obstructive changes are very similar to the prior examination.  Postoperative changes of distal rectal resection with anastomosis in the low anatomic pelvis are again noted. There is extensive surrounding soft tissues thickening and fluid in the perirectal region extending into the presacral space. This is very similar to the prior examination. No definite soft tissue mass is confidently identified at this time in this region. No significant volume of ascites. No pneumoperitoneum. No pathologic distention of small bowel. Postoperative changes are also noted in the central small bowel suggesting prior partial small bowel resection. Prostate gland and urinary bladder are unremarkable in appearance. No definite lymphadenopathy is confidently identified on today's examination in  the abdomen or pelvis.  Musculoskeletal: Unchanged 2.4 cm low-attenuation subcutaneous nodule in the right posterior paraspinal soft tissues, likely a sebaceous cyst. There are no aggressive appearing lytic or blastic lesions noted in the visualized portions of the skeleton.  IMPRESSION: 1. No significant change in pulmonary nodules and masses, as discussed above, compatible with stable metastatic disease to the lungs. 2. Positive response to therapy with regard to the hepatic lesions in segments 2 and 8 of the liver, as discussed above. No new hepatic lesions are noted. 3. Stable postoperative changes in the abdomen and pelvis, as discussed above, with similar appearance of perirectal soft tissue prominence and perirectal fluid extending into the presacral space. 4. Chronically obstructed right ureter with chronic right renal atrophy and multifocal parenchymal scarring, as discussed above. This is similar to the prior examination. 5. Additional incidental findings, as above.   Electronically Signed   By: Trudie Reed M.D.   On: 09/23/2013 10:35   Ct Abdomen Pelvis W Contrast  09/23/2013   CLINICAL DATA:  History of metastatic rectal cancer. Restaging examination.  EXAM: CT CHEST, ABDOMEN, AND PELVIS WITH CONTRAST  TECHNIQUE: Multidetector CT imaging of the chest, abdomen and pelvis was performed following the standard protocol during bolus administration of intravenous contrast.  CONTRAST:  OMNIPAQUE IOHEXOL 300  MG/ML  SOLN  COMPARISON:  CT of the chest abdomen and pelvis 06/07/2013.  FINDINGS: CT CHEST FINDINGS  Mediastinum: Left-sided subclavian single-lumen porta cath with tip terminating at the superior cavoatrial junction. Heart size is normal. There is no significant pericardial fluid, thickening or pericardial calcification. No pathologically enlarged mediastinal or hilar lymph nodes. Mild calcifications of the aortic valve. Esophagus is unremarkable in appearance.  Lungs/Pleura: Previously  noted mass in the perihilar aspect of the superior segment of the left lower lobe is unchanged in size measuring 3.5 x 2.5 cm on image 29 of series 5 of today's examination. Likewise, and a pleural-based nodule in in the medial aspect of the left lower lobe (image 54 of series 5) is also unchanged measuring 2.3 x 1.6 cm. Previously noted nodule in the periphery of the right upper lobe (image 18 of series 5) is similar in appearance measuring roughly 4 mm. No definite new nodules or masses are otherwise noted. Mild diffuse bronchial wall thickening. Mild centrilobular emphysema. No acute consolidative airspace disease. No pleural effusions.  Musculoskeletal: There are no aggressive appearing lytic or blastic lesions noted in the visualized portions of the skeleton.  CT ABDOMEN AND PELVIS FINDINGS  Abdomen/Pelvis: Again noted are multiple well-defined low-attenuation hepatic lesions which are subcentimeter in size, the majority of which are favored to represent small cysts or biliary hamartomas. There are several larger lesions which are more ill-defined and hypovascular, compatible with known metastatic lesions. The largest of these is in segment 2 of the liver, and has decreased in size compared to the prior study, currently measuring 4.8 x 2.8 cm (image 55 of series 2), decreased compared to 5.9 x 3.9 cm on the prior exam. Other prominent lesion near the dome of the liver in segment 8 (image 51 of series 2) currently measures 2.8 x 1.9 cm (previously 3.6 x 2.5 cm). No new suspicious hepatic lesions are otherwise noted.  The appearance of the gallbladder, pancreas, spleen and bilateral adrenal glands is unremarkable. 2 mm calcification in the interpolar region of the left kidney is likely a nonobstructive calculus. Image 92 of series 2 demonstrates a 6 mm calculus in the proximal 3rd of the right ureter, proximal to which there is moderate to severe right hydroureteronephrosis. Multiple additional calculi are seen  dependently in the more proximal aspect of the right ureter, as well as within the right renal collecting system. The renal parenchyma in the right kidney is markedly thinned in multiple regions, compatible with scarring. Generalized right renal atrophy is also noted. Overall, the appearance of the right kidney, ureter and obstructive changes are very similar to the prior examination.  Postoperative changes of distal rectal resection with anastomosis in the low anatomic pelvis are again noted. There is extensive surrounding soft tissues thickening and fluid in the perirectal region extending into the presacral space. This is very similar to the prior examination. No definite soft tissue mass is confidently identified at this time in this region. No significant volume of ascites. No pneumoperitoneum. No pathologic distention of small bowel. Postoperative changes are also noted in the central small bowel suggesting prior partial small bowel resection. Prostate gland and urinary bladder are unremarkable in appearance. No definite lymphadenopathy is confidently identified on today's examination in the abdomen or pelvis.  Musculoskeletal: Unchanged 2.4 cm low-attenuation subcutaneous nodule in the right posterior paraspinal soft tissues, likely a sebaceous cyst. There are no aggressive appearing lytic or blastic lesions noted in the visualized portions of the skeleton.  IMPRESSION:  1. No significant change in pulmonary nodules and masses, as discussed above, compatible with stable metastatic disease to the lungs. 2. Positive response to therapy with regard to the hepatic lesions in segments 2 and 8 of the liver, as discussed above. No new hepatic lesions are noted. 3. Stable postoperative changes in the abdomen and pelvis, as discussed above, with similar appearance of perirectal soft tissue prominence and perirectal fluid extending into the presacral space. 4. Chronically obstructed right ureter with chronic right renal  atrophy and multifocal parenchymal scarring, as discussed above. This is similar to the prior examination. 5. Additional incidental findings, as above.   Electronically Signed   By: Trudie Reed M.D.   On: 09/23/2013 10:35    Medications: I have reviewed the patient's current medications.  Assessment/Plan:  1. Metastatic rectal cancer status post biopsy of rectal mass 09/03/2011 with pathology showing invasive adenocarcinoma. Staging CT scans 09/03/2011 showed a 10 mm hypodense rounded lesion in left lateral hepatic lobe, similar 6 mm lesion in the superior right hepatic lobe and a smaller subcapsular lesion in the lateral right hepatic lobe; rectal mass within the lumen of the bowel emanating from the left wall measuring 4.9 x 3.5 cm and a mass within the low left perirectal fat measuring 2.3 x 3.2 cm consistent with local extension of carcinoma. Staging PET scan 09/18/2011 showed a 5.1 cm distal sigmoid/rectal mass with max SUV 19.2 with local extension into the left perirectal fat with max SUV 7.7. At least 2 suspected hepatic metastases with max SUV 6.7 in the lateral segment left hepatic lobe and max SUV 4.1 in the lateral right hepatic dome. Additional tiny hypodensities in the liver may be cysts; patchy/nodular opacity in the left lower lobe with max SUV 3.5 worrisome for pulmonary metastasis, less likely infectious. He began radiation and concurrent Xeloda chemotherapy on 09/29/2011. He completed radiation on 11/06/2011. MRI of the liver on 12/08/2011 showed 2 hypermetabolic liver lesions, similar in size to 09/03/2011. 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 Equivocal 7 mm lesion in the inferior right hepatic lobe. No evidence of extrahepatic metastasis within the abdomen. An ultrasound-guided biopsy of a small left hepatic lesion on 12/23/2011 was nondiagnostic. He underwent a low anterior resection with loop ileostomy on 02/06/2012  with final pathology showing a 3.5 cm invasive adenocarcinoma with abundant extracellular mucin invading through the muscularis propria into pericolonic fatty tissue; 4 of 10 pericolonic lymph nodes were positive for metastatic carcinoma; resection margins were clear (ypT3,ypN2). He began FOLFOX chemotherapy on 03/16/2012. He completed 2 cycles of FOLFOX chemotherapy and underwent an ileostomy reversal 05/05/2012. FOLFOX was resumed on 06/14/2012 and completed on 09/14/2012. 2. CEA 13.8 on 09/17/2011; 1.6 on 12/01/2011; 8.4 on 03/09/2012, 2.1 on 05/03/2012; 2.5 on 07/26/2012; 1.8 on 10/12/2012. 3.0 on 11/23/2012. CEA elevated at 25.4 on 02/22/2013, 44.7 on 04/04/2013, and 59.1 on 05/17/2013. CEA improved at 16.8 on 08/10/2013 and 10.8 on 09/23/2013. 3. CT chest/abdomen/pelvis 03/10/2013 with 2 new left lower lobe nodules and 2 liver lesions increased in size. Progressive metastatic disease in the lungs and liver on a restaging CT 06/07/2013. He began treatment with FOLFIRI/Avastin on 06/21/2013. Restaging CT 09/23/2013 after 5 cycles of FOLFIRI/Avastin, with stable lung lesions and a decrease in the size of dominant liver lesions. Cycle 6 FOLFIRI/Avastin 09/27/2013. 4. K-ras mutation detected. 5. Rectal pain and bleeding secondary to #1. Resolved. 6. Frequent loose stools secondary to #1. Improved. 7. Hypertension. 8. Tobacco use. 9. CT  scan 09/03/2011 with chronic right hydronephrosis secondary to an obstructing calculus in the mid right ureter. He is followed by Dr. Brunilda Payor. Stable on the CT 06/07/2013 10. Status post cystoscopy, right retrograde pyelogram, ureteroscopy and insertion of right double-J stent 11/28/2011. The double-J stent was replaced 02/06/2012. The stent has been removed. 11. Elevated BUN/creatinine. Likely secondary to dehydration while the ileostomy was in place. He now appears to have chronic renal insufficiency. 12. Status post ultrasound-guided fine-needle aspiration of small left  hepatic lesion 12/22/2011. The pathology revealed no malignancy. 13. Malaise and weight loss. Resolved. 14. High output ileostomy. Partially improved with Imodium. Status post ileostomy reversal 05/05/2012. 15. Erectile dysfunction. He is followed by Dr. Brunilda Payor. 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 17. Diarrhea after FOLFOX chemotherapy given on 07/26/2012-resolved. The bolus and infusional 5-FU were dose reduced by 20% beginning with FOLFOX on 08/17/2012.  18. History of an Irregular heart rate- Status post evaluation by Dr. Clent Ridges on 11/02/2012 with findings of sinus rhythm with occasional PACs. 19. Diarrhea following FOLFIRI/Avastin. He reports the diarrhea is relieved with Lomotil. 20. "Sinus congestion". He notes good relief with Nasonex. 21. Hypokalemia 07/05/2013. He continues a potassium supplement.  22. Thrush. He will complete a 4 day course of Diflucan.  Disposition-Mr. Sackmann appears stable. He has completed 6 cycles of FOLFIRI/Avastin. Plan to proceed with cycle 7 today as scheduled. He will return for a followup visit and cycle 8 on 11/07/2013. He will contact the office in the interim with any problems.  Lonna Cobb ANP/GNP-BC

## 2013-10-18 NOTE — Patient Instructions (Signed)
[facility] Discharge Instructions for Patients Receiving Chemotherapy  Today you received the following chemotherapy agents: Avastin, Leucovorin, Irinotecan, 5FU  To help prevent nausea and vomiting after your treatment, we encourage you to take your nausea medication as prescribed.    If you develop nausea and vomiting that is not controlled by your nausea medication, call the clinic.   BELOW ARE SYMPTOMS THAT SHOULD BE REPORTED IMMEDIATELY:  *FEVER GREATER THAN 100.5 F  *CHILLS WITH OR WITHOUT FEVER  NAUSEA AND VOMITING THAT IS NOT CONTROLLED WITH YOUR NAUSEA MEDICATION  *UNUSUAL SHORTNESS OF BREATH  *UNUSUAL BRUISING OR BLEEDING  TENDERNESS IN MOUTH AND THROAT WITH OR WITHOUT PRESENCE OF ULCERS  *URINARY PROBLEMS  *BOWEL PROBLEMS  UNUSUAL RASH Items with * indicate a potential emergency and should be followed up as soon as possible.  Feel free to call the clinic you have any questions or concerns. The clinic phone number is [phone].    

## 2013-10-18 NOTE — Telephone Encounter (Signed)
Tried to work 11/11 POF.  Pt did not like TOA as per POF.Marland Kitchenemail to Lonna Cobb for clarification shh

## 2013-10-19 ENCOUNTER — Other Ambulatory Visit: Payer: Self-pay | Admitting: Oncology

## 2013-10-19 ENCOUNTER — Telehealth: Payer: Self-pay | Admitting: *Deleted

## 2013-10-19 DIAGNOSIS — C20 Malignant neoplasm of rectum: Secondary | ICD-10-CM

## 2013-10-19 DIAGNOSIS — C7800 Secondary malignant neoplasm of unspecified lung: Secondary | ICD-10-CM

## 2013-10-19 LAB — CEA: CEA: 8.9 ng/mL — ABNORMAL HIGH (ref 0.0–5.0)

## 2013-10-19 NOTE — Telephone Encounter (Signed)
sw pt adv worked out next appts on 12/3 instead of 12/1 as had been discussed Cal mailed shh

## 2013-10-19 NOTE — Telephone Encounter (Signed)
Message copied by Gala Romney on Wed Oct 19, 2013  4:59 PM ------      Message from: Ladene Artist      Created: Tue Oct 18, 2013  8:46 PM       Please call patient, increase kcl to bid, make cmet stat prior to next chemo. ------

## 2013-10-19 NOTE — Telephone Encounter (Signed)
Notified pt per MD to increase kcl to twice a day because potassium is low and pt will get re-check 11/09/13.  Pt verbalized understanding and stated he did have enough medicine until 12/3.

## 2013-10-20 ENCOUNTER — Ambulatory Visit (HOSPITAL_BASED_OUTPATIENT_CLINIC_OR_DEPARTMENT_OTHER): Payer: 59

## 2013-10-20 VITALS — BP 162/72 | HR 82 | Temp 97.0°F

## 2013-10-20 DIAGNOSIS — C779 Secondary and unspecified malignant neoplasm of lymph node, unspecified: Secondary | ICD-10-CM

## 2013-10-20 DIAGNOSIS — C7800 Secondary malignant neoplasm of unspecified lung: Secondary | ICD-10-CM

## 2013-10-20 DIAGNOSIS — C787 Secondary malignant neoplasm of liver and intrahepatic bile duct: Secondary | ICD-10-CM

## 2013-10-20 DIAGNOSIS — C20 Malignant neoplasm of rectum: Secondary | ICD-10-CM

## 2013-10-20 DIAGNOSIS — Z452 Encounter for adjustment and management of vascular access device: Secondary | ICD-10-CM

## 2013-10-20 MED ORDER — HEPARIN SOD (PORK) LOCK FLUSH 100 UNIT/ML IV SOLN
500.0000 [IU] | Freq: Once | INTRAVENOUS | Status: AC | PRN
  Administered 2013-10-20: 500 [IU]
  Filled 2013-10-20: qty 5

## 2013-10-20 MED ORDER — SODIUM CHLORIDE 0.9 % IJ SOLN
10.0000 mL | INTRAMUSCULAR | Status: DC | PRN
  Administered 2013-10-20: 10 mL
  Filled 2013-10-20: qty 10

## 2013-10-20 NOTE — Progress Notes (Signed)
Patient pump D/C'ed in infusion room.

## 2013-11-01 ENCOUNTER — Ambulatory Visit: Payer: 59 | Attending: Oncology | Admitting: Physical Therapy

## 2013-11-01 DIAGNOSIS — IMO0001 Reserved for inherently not codable concepts without codable children: Secondary | ICD-10-CM | POA: Insufficient documentation

## 2013-11-01 DIAGNOSIS — M629 Disorder of muscle, unspecified: Secondary | ICD-10-CM | POA: Insufficient documentation

## 2013-11-01 DIAGNOSIS — M2420 Disorder of ligament, unspecified site: Secondary | ICD-10-CM | POA: Insufficient documentation

## 2013-11-04 ENCOUNTER — Other Ambulatory Visit: Payer: Self-pay | Admitting: Oncology

## 2013-11-07 ENCOUNTER — Ambulatory Visit: Payer: 59 | Admitting: Oncology

## 2013-11-07 ENCOUNTER — Other Ambulatory Visit: Payer: 59

## 2013-11-08 ENCOUNTER — Ambulatory Visit: Payer: 59 | Attending: Oncology | Admitting: Physical Therapy

## 2013-11-08 DIAGNOSIS — M629 Disorder of muscle, unspecified: Secondary | ICD-10-CM | POA: Insufficient documentation

## 2013-11-08 DIAGNOSIS — IMO0001 Reserved for inherently not codable concepts without codable children: Secondary | ICD-10-CM | POA: Insufficient documentation

## 2013-11-08 DIAGNOSIS — M2420 Disorder of ligament, unspecified site: Secondary | ICD-10-CM | POA: Insufficient documentation

## 2013-11-09 ENCOUNTER — Telehealth: Payer: Self-pay | Admitting: Oncology

## 2013-11-09 ENCOUNTER — Ambulatory Visit (HOSPITAL_BASED_OUTPATIENT_CLINIC_OR_DEPARTMENT_OTHER): Payer: 59

## 2013-11-09 ENCOUNTER — Other Ambulatory Visit (HOSPITAL_BASED_OUTPATIENT_CLINIC_OR_DEPARTMENT_OTHER): Payer: 59 | Admitting: Lab

## 2013-11-09 ENCOUNTER — Encounter (INDEPENDENT_AMBULATORY_CARE_PROVIDER_SITE_OTHER): Payer: Self-pay

## 2013-11-09 ENCOUNTER — Ambulatory Visit (HOSPITAL_BASED_OUTPATIENT_CLINIC_OR_DEPARTMENT_OTHER): Payer: 59 | Admitting: Nurse Practitioner

## 2013-11-09 VITALS — BP 135/70 | HR 88 | Resp 16

## 2013-11-09 VITALS — BP 132/68 | HR 99 | Temp 97.9°F | Resp 20 | Ht 70.0 in | Wt 177.1 lb

## 2013-11-09 DIAGNOSIS — C787 Secondary malignant neoplasm of liver and intrahepatic bile duct: Secondary | ICD-10-CM

## 2013-11-09 DIAGNOSIS — Z5111 Encounter for antineoplastic chemotherapy: Secondary | ICD-10-CM

## 2013-11-09 DIAGNOSIS — E876 Hypokalemia: Secondary | ICD-10-CM

## 2013-11-09 DIAGNOSIS — C20 Malignant neoplasm of rectum: Secondary | ICD-10-CM

## 2013-11-09 DIAGNOSIS — C7800 Secondary malignant neoplasm of unspecified lung: Secondary | ICD-10-CM

## 2013-11-09 DIAGNOSIS — R0609 Other forms of dyspnea: Secondary | ICD-10-CM

## 2013-11-09 DIAGNOSIS — I10 Essential (primary) hypertension: Secondary | ICD-10-CM

## 2013-11-09 DIAGNOSIS — M25519 Pain in unspecified shoulder: Secondary | ICD-10-CM

## 2013-11-09 DIAGNOSIS — F17200 Nicotine dependence, unspecified, uncomplicated: Secondary | ICD-10-CM

## 2013-11-09 DIAGNOSIS — Z5112 Encounter for antineoplastic immunotherapy: Secondary | ICD-10-CM

## 2013-11-09 LAB — CBC WITH DIFFERENTIAL/PLATELET
BASO%: 0.6 % (ref 0.0–2.0)
EOS%: 1.1 % (ref 0.0–7.0)
MCH: 34.7 pg — ABNORMAL HIGH (ref 27.2–33.4)
MCHC: 33.5 g/dL (ref 32.0–36.0)
MCV: 103.6 fL — ABNORMAL HIGH (ref 79.3–98.0)
MONO%: 5.9 % (ref 0.0–14.0)
RBC: 3.25 10*6/uL — ABNORMAL LOW (ref 4.20–5.82)
RDW: 15.6 % — ABNORMAL HIGH (ref 11.0–14.6)

## 2013-11-09 LAB — COMPREHENSIVE METABOLIC PANEL (CC13)
AST: 17 U/L (ref 5–34)
Albumin: 3.1 g/dL — ABNORMAL LOW (ref 3.5–5.0)
Alkaline Phosphatase: 124 U/L (ref 40–150)
BUN: 27.1 mg/dL — ABNORMAL HIGH (ref 7.0–26.0)
Calcium: 9.5 mg/dL (ref 8.4–10.4)
Potassium: 3.9 mEq/L (ref 3.5–5.1)
Sodium: 143 mEq/L (ref 136–145)
Total Protein: 7.7 g/dL (ref 6.4–8.3)

## 2013-11-09 MED ORDER — ATROPINE SULFATE 1 MG/ML IJ SOLN
INTRAMUSCULAR | Status: AC
  Filled 2013-11-09: qty 1

## 2013-11-09 MED ORDER — FLUOROURACIL CHEMO INJECTION 2.5 GM/50ML
320.0000 mg/m2 | Freq: Once | INTRAVENOUS | Status: AC
  Administered 2013-11-09: 650 mg via INTRAVENOUS
  Filled 2013-11-09: qty 13

## 2013-11-09 MED ORDER — LEUCOVORIN CALCIUM INJECTION 350 MG
322.0000 mg/m2 | Freq: Once | INTRAVENOUS | Status: AC
  Administered 2013-11-09: 650 mg via INTRAVENOUS
  Filled 2013-11-09: qty 32.5

## 2013-11-09 MED ORDER — SODIUM CHLORIDE 0.9 % IV SOLN
1920.0000 mg/m2 | INTRAVENOUS | Status: DC
  Administered 2013-11-09: 3900 mg via INTRAVENOUS
  Filled 2013-11-09: qty 78

## 2013-11-09 MED ORDER — DEXAMETHASONE SODIUM PHOSPHATE 20 MG/5ML IJ SOLN
INTRAMUSCULAR | Status: AC
  Filled 2013-11-09: qty 5

## 2013-11-09 MED ORDER — PALONOSETRON HCL INJECTION 0.25 MG/5ML
INTRAVENOUS | Status: AC
  Filled 2013-11-09: qty 5

## 2013-11-09 MED ORDER — DEXAMETHASONE SODIUM PHOSPHATE 20 MG/5ML IJ SOLN
20.0000 mg | Freq: Once | INTRAMUSCULAR | Status: AC
  Administered 2013-11-09: 20 mg via INTRAVENOUS

## 2013-11-09 MED ORDER — ATROPINE SULFATE 1 MG/ML IJ SOLN
0.5000 mg | Freq: Once | INTRAMUSCULAR | Status: AC | PRN
  Administered 2013-11-09: 0.5 mg via INTRAVENOUS

## 2013-11-09 MED ORDER — SODIUM CHLORIDE 0.9 % IV SOLN
Freq: Once | INTRAVENOUS | Status: AC
  Administered 2013-11-09: 12:00:00 via INTRAVENOUS

## 2013-11-09 MED ORDER — TRAMADOL HCL 50 MG PO TABS: 50.0000 mg | ORAL_TABLET | Freq: Two times a day (BID) | ORAL | Status: DC | PRN

## 2013-11-09 MED ORDER — PALONOSETRON HCL INJECTION 0.25 MG/5ML
0.2500 mg | Freq: Once | INTRAVENOUS | Status: AC
  Administered 2013-11-09: 0.25 mg via INTRAVENOUS

## 2013-11-09 MED ORDER — IRINOTECAN HCL CHEMO INJECTION 100 MG/5ML
144.0000 mg/m2 | Freq: Once | INTRAVENOUS | Status: AC
  Administered 2013-11-09: 290 mg via INTRAVENOUS
  Filled 2013-11-09: qty 14.5

## 2013-11-09 MED ORDER — SODIUM CHLORIDE 0.9 % IV SOLN
5.0000 mg/kg | Freq: Once | INTRAVENOUS | Status: AC
  Administered 2013-11-09: 425 mg via INTRAVENOUS
  Filled 2013-11-09: qty 17

## 2013-11-09 NOTE — Patient Instructions (Signed)
[facility] Discharge Instructions for Patients Receiving Chemotherapy  Today you received the following chemotherapy agents Leucovorin, Camptosar, Adrucil and Avastin.  To help prevent nausea and vomiting after your treatment, we encourage you to take your nausea medication as prescribed.   If you develop nausea and vomiting that is not controlled by your nausea medication, call the clinic.   BELOW ARE SYMPTOMS THAT SHOULD BE REPORTED IMMEDIATELY:  *FEVER GREATER THAN 100.5 F  *CHILLS WITH OR WITHOUT FEVER  NAUSEA AND VOMITING THAT IS NOT CONTROLLED WITH YOUR NAUSEA MEDICATION  *UNUSUAL SHORTNESS OF BREATH  *UNUSUAL BRUISING OR BLEEDING  TENDERNESS IN MOUTH AND THROAT WITH OR WITHOUT PRESENCE OF ULCERS  *URINARY PROBLEMS  *BOWEL PROBLEMS  UNUSUAL RASH Items with * indicate a potential emergency and should be followed up as soon as possible.  Feel free to call the clinic you have any questions or concerns. The clinic phone number is [phone].

## 2013-11-09 NOTE — Telephone Encounter (Signed)
gv and printed appt sched and avs for pt for pt.Marland KitchenMarland Kitchen

## 2013-11-09 NOTE — Progress Notes (Signed)
OFFICE PROGRESS NOTE  Interval history:  [name] [name] returns for followup of metastatic rectal cancer. He completed cycle 7 FOLFIRI/Avastin on 10/18/2013. He noted less diarrhea than with previous cycles. He had some nausea. No vomiting. He denies any mouth sores. He denies abdominal pain. No bleeding. He has mild dyspnea on exertion. No chest pain. He occasionally notes "soreness" at the left ribs. He has intermittent shoulder pain both sides. He thinks the pain is related to arthritis. He has a good appetite.   Objective: Blood pressure 132/68, pulse 99, temperature 97.9 F (36.6 C), temperature source Oral, resp. rate 20, height 5\' 10"  (1.778 m), weight 177 lb 1.6 oz (80.332 kg).  Oropharynx is without thrush or ulceration. Lungs are clear. No wheezes or rales. Regular cardiac rhythm. Port-A-Cath site is without erythema. Abdomen is soft and nontender. No hepatomegaly. Legs without edema. Calves soft and nontender.  Lab Results: Lab Results  Component Value Date   WBC 8.0 11/09/2013   HGB 11.3* 11/09/2013   HCT 33.7* 11/09/2013   MCV 103.6* 11/09/2013   PLT 324 11/09/2013    Chemistry:    Chemistry      Component Value Date/Time   NA 143 11/09/2013 0941   NA 138 04/04/2013 1650   K 3.9 11/09/2013 0941   K 4.0 04/04/2013 1650   CL 109* 05/16/2013 0829   CL 105 04/04/2013 1650   CO2 23 11/09/2013 0941   CO2 24 04/04/2013 1650   BUN 27.1* 11/09/2013 0941   BUN 28* 04/04/2013 1650   CREATININE 1.6* 11/09/2013 0941   CREATININE 1.48* 04/04/2013 1650      Component Value Date/Time   CALCIUM 9.5 11/09/2013 0941   CALCIUM 9.6 04/04/2013 1650   ALKPHOS 124 11/09/2013 0941   ALKPHOS 99 07/26/2012 1008   AST 17 11/09/2013 0941   AST 13 07/26/2012 1008   ALT 18 11/09/2013 0941   ALT 10 07/26/2012 1008   BILITOT 0.45 11/09/2013 0941   BILITOT 0.5 07/26/2012 1008       Studies/Results: No results found.  Medications: I have reviewed the patient's current  medications.  Assessment/Plan:  1. Metastatic rectal cancer status post biopsy of rectal mass 09/03/2011 with pathology showing invasive adenocarcinoma. Staging CT scans 09/03/2011 showed a 10 mm hypodense rounded lesion in left lateral hepatic lobe, similar 6 mm lesion in the superior right hepatic lobe and a smaller subcapsular lesion in the lateral right hepatic lobe; rectal mass within the lumen of the bowel emanating from the left wall measuring 4.9 x 3.5 cm and a mass within the low left perirectal fat measuring 2.3 x 3.2 cm consistent with local extension of carcinoma. Staging PET scan 09/18/2011 showed a 5.1 cm distal sigmoid/rectal mass with max SUV 19.2 with local extension into the left perirectal fat with max SUV 7.7. At least 2 suspected hepatic metastases with max SUV 6.7 in the lateral segment left hepatic lobe and max SUV 4.1 in the lateral right hepatic dome. Additional tiny hypodensities in the liver may be cysts; patchy/nodular opacity in the left lower lobe with max SUV 3.5 worrisome for pulmonary metastasis, less likely infectious. He began radiation and concurrent Xeloda chemotherapy on 09/29/2011. He completed radiation on 11/06/2011. MRI of the liver on 12/08/2011 showed 2 hypermetabolic liver lesions, similar in size to 09/03/2011. 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 Equivocal 7 mm lesion in the inferior right hepatic lobe. No evidence of extrahepatic metastasis within the abdomen. An  ultrasound-guided biopsy of a small left hepatic lesion on 12/23/2011 was nondiagnostic. He underwent a low anterior resection with loop ileostomy on 02/06/2012 with final pathology showing a 3.5 cm invasive adenocarcinoma with abundant extracellular mucin invading through the muscularis propria into pericolonic fatty tissue; 4 of 10 pericolonic lymph nodes were positive for metastatic carcinoma; resection margins were clear (ypT3,ypN2). He  began FOLFOX chemotherapy on 03/16/2012. He completed 2 cycles of FOLFOX chemotherapy and underwent an ileostomy reversal 05/05/2012. FOLFOX was resumed on 06/14/2012 and completed on 09/14/2012. 2. CEA 13.8 on 09/17/2011; 1.6 on 12/01/2011; 8.4 on 03/09/2012, 2.1 on 05/03/2012; 2.5 on 07/26/2012; 1.8 on 10/12/2012. 3.0 on 11/23/2012. CEA elevated at 25.4 on 02/22/2013, 44.7 on 04/04/2013, and 59.1 on 05/17/2013. CEA improved at 16.8 on 08/10/2013 and 10.8 on 09/23/2013. CEA improved at 8.9 on 10/18/2013. 3. CT chest/abdomen/pelvis 03/10/2013 with 2 new left lower lobe nodules and 2 liver lesions increased in size. Progressive metastatic disease in the lungs and liver on a restaging CT 06/07/2013. He began treatment with FOLFIRI/Avastin on 06/21/2013. Restaging CT 09/23/2013 after 5 cycles of FOLFIRI/Avastin, with stable lung lesions and a decrease in the size of dominant liver lesions.  Cycle 6 FOLFIRI/Avastin 09/27/2013. Cycle 7 FOLFIRI/Avastin 10/18/2013. 4. K-ras mutation detected. 5. Rectal pain and bleeding secondary to #1. Resolved. 6. Frequent loose stools secondary to #1. Improved. 7. Hypertension. 8. Tobacco use. 9. CT scan 09/03/2011 with chronic right hydronephrosis secondary to an obstructing calculus in the mid right ureter. He is followed by Dr. Brunilda Payor. Stable on the CT 06/07/2013 10. Status post cystoscopy, right retrograde pyelogram, ureteroscopy and insertion of right double-J stent 11/28/2011. The double-J stent was replaced 02/06/2012. The stent has been removed. 11. Elevated BUN/creatinine. Likely secondary to dehydration while the ileostomy was in place. He now appears to have chronic renal insufficiency. 12. Status post ultrasound-guided fine-needle aspiration of small left hepatic lesion 12/22/2011. The pathology revealed no malignancy. 13. Malaise and weight loss. Resolved. 14. High output ileostomy. Partially improved with Imodium. Status post ileostomy reversal  05/05/2012. 15. Erectile dysfunction. He is followed by Dr. Brunilda Payor. 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 17. Diarrhea after FOLFOX chemotherapy given on 07/26/2012-resolved. The bolus and infusional 5-FU were dose reduced by 20% beginning with FOLFOX on 08/17/2012.  18. History of an Irregular heart rate- Status post evaluation by Dr. Clent Ridges on 11/02/2012 with findings of sinus rhythm with occasional PACs. 19. Diarrhea following FOLFIRI/Avastin. He reports the diarrhea is relieved with Lomotil. He had less diarrhea following cycle 7. 20. "Sinus congestion". He notes good relief with Nasonex. 21. Hypokalemia 07/05/2013. He continues a potassium supplement.  22. Thrush noted when here on 10/18/2013. He completed a 4 day course of Diflucan. 23. Intermittent bilateral shoulder pain. Question arthritis. He will try Ultram 50 mg every 12 hours as needed.  Disposition-[name] [name] appears stable. He has completed 7 cycles of FOLFIRI/Avastin. Plan to proceed with cycle 8 today as scheduled. We will followup on the CEA from today.   He will return for a followup visit and cycle 9 on 12/05/2013. He will contact the office in the interim with any problems.  Plan reviewed with Dr. Truett Perna.  Lonna Cobb ANP/GNP-BC

## 2013-11-11 ENCOUNTER — Telehealth: Payer: Self-pay | Admitting: *Deleted

## 2013-11-11 ENCOUNTER — Ambulatory Visit (HOSPITAL_BASED_OUTPATIENT_CLINIC_OR_DEPARTMENT_OTHER): Payer: 59

## 2013-11-11 ENCOUNTER — Encounter (INDEPENDENT_AMBULATORY_CARE_PROVIDER_SITE_OTHER): Payer: Self-pay

## 2013-11-11 VITALS — BP 138/75 | HR 87 | Temp 98.5°F

## 2013-11-11 DIAGNOSIS — C7800 Secondary malignant neoplasm of unspecified lung: Secondary | ICD-10-CM

## 2013-11-11 DIAGNOSIS — C20 Malignant neoplasm of rectum: Secondary | ICD-10-CM

## 2013-11-11 DIAGNOSIS — Z452 Encounter for adjustment and management of vascular access device: Secondary | ICD-10-CM

## 2013-11-11 DIAGNOSIS — C787 Secondary malignant neoplasm of liver and intrahepatic bile duct: Secondary | ICD-10-CM

## 2013-11-11 MED ORDER — HEPARIN SOD (PORK) LOCK FLUSH 100 UNIT/ML IV SOLN
500.0000 [IU] | Freq: Once | INTRAVENOUS | Status: AC | PRN
  Administered 2013-11-11: 500 [IU]
  Filled 2013-11-11: qty 5

## 2013-11-11 MED ORDER — SODIUM CHLORIDE 0.9 % IJ SOLN
10.0000 mL | INTRAMUSCULAR | Status: DC | PRN
Start: 2013-11-11 — End: 2013-11-11
  Administered 2013-11-11: 10 mL
  Filled 2013-11-11: qty 10

## 2013-11-11 NOTE — Telephone Encounter (Signed)
Per staff message and POF I have scheduled appts.  JMW  

## 2013-11-15 ENCOUNTER — Ambulatory Visit: Payer: 59 | Admitting: Physical Therapy

## 2013-11-21 ENCOUNTER — Ambulatory Visit: Payer: 59 | Admitting: Internal Medicine

## 2013-11-22 ENCOUNTER — Encounter: Payer: 59 | Admitting: Physical Therapy

## 2013-12-03 ENCOUNTER — Other Ambulatory Visit: Payer: Self-pay | Admitting: Oncology

## 2013-12-05 ENCOUNTER — Telehealth: Payer: Self-pay | Admitting: *Deleted

## 2013-12-05 ENCOUNTER — Ambulatory Visit: Payer: 59

## 2013-12-05 ENCOUNTER — Other Ambulatory Visit (HOSPITAL_BASED_OUTPATIENT_CLINIC_OR_DEPARTMENT_OTHER): Payer: 59

## 2013-12-05 ENCOUNTER — Ambulatory Visit (HOSPITAL_BASED_OUTPATIENT_CLINIC_OR_DEPARTMENT_OTHER): Payer: 59 | Admitting: Oncology

## 2013-12-05 ENCOUNTER — Telehealth: Payer: Self-pay | Admitting: Oncology

## 2013-12-05 ENCOUNTER — Encounter (INDEPENDENT_AMBULATORY_CARE_PROVIDER_SITE_OTHER): Payer: Self-pay

## 2013-12-05 VITALS — BP 145/85 | HR 94 | Temp 98.0°F | Resp 18 | Ht 70.0 in | Wt 175.4 lb

## 2013-12-05 DIAGNOSIS — K3189 Other diseases of stomach and duodenum: Secondary | ICD-10-CM

## 2013-12-05 DIAGNOSIS — E876 Hypokalemia: Secondary | ICD-10-CM

## 2013-12-05 DIAGNOSIS — C787 Secondary malignant neoplasm of liver and intrahepatic bile duct: Secondary | ICD-10-CM

## 2013-12-05 DIAGNOSIS — C7800 Secondary malignant neoplasm of unspecified lung: Secondary | ICD-10-CM

## 2013-12-05 DIAGNOSIS — C20 Malignant neoplasm of rectum: Secondary | ICD-10-CM

## 2013-12-05 LAB — CBC WITH DIFFERENTIAL/PLATELET
Basophils Absolute: 0 10*3/uL (ref 0.0–0.1)
EOS%: 0.8 % (ref 0.0–7.0)
HCT: 36.4 % — ABNORMAL LOW (ref 38.4–49.9)
HGB: 12.4 g/dL — ABNORMAL LOW (ref 13.0–17.1)
LYMPH%: 10.6 % — ABNORMAL LOW (ref 14.0–49.0)
MCH: 34.8 pg — ABNORMAL HIGH (ref 27.2–33.4)
MONO#: 0.9 10*3/uL (ref 0.1–0.9)
NEUT#: 6.3 10*3/uL (ref 1.5–6.5)
NEUT%: 77.6 % — ABNORMAL HIGH (ref 39.0–75.0)
Platelets: 166 10*3/uL (ref 140–400)
WBC: 8.1 10*3/uL (ref 4.0–10.3)
lymph#: 0.9 10*3/uL (ref 0.9–3.3)

## 2013-12-05 LAB — COMPREHENSIVE METABOLIC PANEL (CC13)
AST: 17 U/L (ref 5–34)
Alkaline Phosphatase: 111 U/L (ref 40–150)
Anion Gap: 10 mEq/L (ref 3–11)
BUN: 21.4 mg/dL (ref 7.0–26.0)
CO2: 23 mEq/L (ref 22–29)
Calcium: 8.8 mg/dL (ref 8.4–10.4)
Chloride: 111 mEq/L — ABNORMAL HIGH (ref 98–109)
Creatinine: 1.5 mg/dL — ABNORMAL HIGH (ref 0.7–1.3)
Potassium: 3.7 mEq/L (ref 3.5–5.1)
Total Bilirubin: 0.57 mg/dL (ref 0.20–1.20)

## 2013-12-05 MED ORDER — PANTOPRAZOLE SODIUM 40 MG PO TBEC: 40.0000 mg | DELAYED_RELEASE_TABLET | Freq: Two times a day (BID) | ORAL | Status: DC

## 2013-12-05 NOTE — Telephone Encounter (Signed)
Gave pt appt for lab,md and chemo for january and february 2015 °

## 2013-12-05 NOTE — Progress Notes (Signed)
[facility]    OFFICE PROGRESS NOTE   INTERVAL HISTORY:   [name] returns for scheduled followup of metastatic rectal cancer. He completed another cycle of FOLFIRI/Avastin on 11/09/2013. He reports 1 episode of vomiting on day 2. He had diarrhea lasting 4-5 days. He complains of heartburn, chiefly in the evenings. He continues to work. He has not been eating well secondary to dyspepsia.  Objective:  Vital signs in last 24 hours:  Blood pressure 145/85, pulse 94, temperature 98 F (36.7 C), temperature source Oral, resp. rate 18, height 5\' 10"  (1.778 m), weight 175 lb 6.4 oz (79.561 kg).    HEENT: No thrush or ulcer Resp: Lungs clear bilaterally Cardio: Regular rate and rhythm GI: No hepatosplenomegaly, nontender, no mass Vascular: No leg edema    Portacath/PICC-without erythema  Lab Results:  Lab Results  Component Value Date   WBC 8.1 12/05/2013   HGB 12.4* 12/05/2013   HCT 36.4* 12/05/2013   MCV 102.1* 12/05/2013   PLT 166 12/05/2013   NEUTROABS 6.3 12/05/2013   CEA on 11/09/2013-7.3   Medications: I have reviewed the patient's current medications.  Assessment/Plan: 1. Metastatic rectal cancer status post biopsy of rectal mass 09/03/2011 with pathology showing invasive adenocarcinoma. Staging CT scans 09/03/2011 showed a 10 mm hypodense rounded lesion in left lateral hepatic lobe, similar 6 mm lesion in the superior right hepatic lobe and a smaller subcapsular lesion in the lateral right hepatic lobe; rectal mass within the lumen of the bowel emanating from the left wall measuring 4.9 x 3.5 cm and a mass within the low left perirectal fat measuring 2.3 x 3.2 cm consistent with local extension of carcinoma. Staging PET scan 09/18/2011 showed a 5.1 cm distal sigmoid/rectal mass with max SUV 19.2 with local extension into the left perirectal fat with max SUV 7.7. At least 2 suspected hepatic metastases with max SUV 6.7 in the lateral segment left  hepatic lobe and max SUV 4.1 in the lateral right hepatic dome. Additional tiny hypodensities in the liver may be cysts; patchy/nodular opacity in the left lower lobe with max SUV 3.5 worrisome for pulmonary metastasis, less likely infectious. He began radiation and concurrent Xeloda chemotherapy on 09/29/2011. He completed radiation on 11/06/2011. MRI of the liver on 12/08/2011 showed 2 hypermetabolic liver lesions, similar in size to 09/03/2011. 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 Equivocal 7 mm lesion in the inferior right hepatic lobe. No evidence of extrahepatic metastasis within the abdomen. An ultrasound-guided biopsy of a small left hepatic lesion on 12/23/2011 was nondiagnostic. He underwent a low anterior resection with loop ileostomy on 02/06/2012 with final pathology showing a 3.5 cm invasive adenocarcinoma with abundant extracellular mucin invading through the muscularis propria into pericolonic fatty tissue; 4 of 10 pericolonic lymph nodes were positive for metastatic carcinoma; resection margins were clear (ypT3,ypN2). He began FOLFOX chemotherapy on 03/16/2012. He completed 2 cycles of FOLFOX chemotherapy and underwent an ileostomy reversal 05/05/2012. FOLFOX was resumed on 06/14/2012 and completed on 09/14/2012. 2. CEA 13.8 on 09/17/2011; 1.6 on 12/01/2011; 8.4 on 03/09/2012, 2.1 on 05/03/2012; 2.5 on 07/26/2012; 1.8 on 10/12/2012. 3.0 on 11/23/2012. CEA elevated at 25.4 on 02/22/2013, 44.7 on 04/04/2013, and 59.1 on 05/17/2013. CEA improved at 16.8 on 08/10/2013 and 10.8 on 09/23/2013. CEA improved at 8.9 on 10/18/2013. 3. CT chest/abdomen/pelvis 03/10/2013 with 2 new left lower lobe nodules and 2 liver lesions increased in size. Progressive metastatic disease in the lungs and  liver on a restaging CT 06/07/2013. He began treatment with FOLFIRI/Avastin on 06/21/2013. Restaging CT 09/23/2013 after 5 cycles of FOLFIRI/Avastin, with  stable lung lesions and a decrease in the size of dominant liver lesions.  Cycle 6 FOLFIRI/Avastin 09/27/2013.  Cycle 7 FOLFIRI/Avastin 10/18/2013. Cycle 8 FOLFIRI/Avastin 11/09/2013 4. K-ras mutation detected. 5. Rectal pain and bleeding secondary to #1. Resolved. 6. Frequent loose stools secondary to #1. Improved. 7. Hypertension. 8. Tobacco use. 9. CT scan 09/03/2011 with chronic right hydronephrosis secondary to an obstructing calculus in the mid right ureter. He is followed by Dr. Brunilda Payor. Stable on the CT 06/07/2013 10. Status post cystoscopy, right retrograde pyelogram, ureteroscopy and insertion of right double-J stent 11/28/2011. The double-J stent was replaced 02/06/2012. The stent has been removed. 11. Elevated BUN/creatinine. Likely secondary to dehydration while the ileostomy was in place. He now appears to have chronic renal insufficiency. 12. Status post ultrasound-guided fine-needle aspiration of small left hepatic lesion 12/22/2011. The pathology revealed no malignancy. 13. Malaise and weight loss. Resolved. 14. High output ileostomy. Partially improved with Imodium. Status post ileostomy reversal 05/05/2012. 15. Erectile dysfunction. He is followed by Dr. Brunilda Payor. 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 17. Diarrhea after FOLFOX chemotherapy given on 07/26/2012-resolved. The bolus and infusional 5-FU were dose reduced by 20% beginning with FOLFOX on 08/17/2012.  18. History of an Irregular heart rate- Status post evaluation by Dr. Clent Ridges on 11/02/2012 with findings of sinus rhythm with occasional PACs. 19. Diarrhea following FOLFIRI/Avastin. He reports the diarrhea is relieved with Lomotil.  20. "Sinus congestion". He notes good relief with Nasonex. 21. Hypokalemia 07/05/2013. He continues a potassium supplement.  22. Thrush noted when here on 10/18/2013. He completed a 4 day course of Diflucan. 23. Intermittent bilateral shoulder pain. Question arthritis.   24. "Heartburn "-he will begin a trial of Protonix   Disposition:  [name] has dyspepsia/reflux symptoms. He would like to hold chemotherapy today and reschedule for 12/13/2013. He will begin Protonix. He will let us know if the Protonix does not help the GI symptoms. The plan is to complete 2-3 more cycles of FOLFIRI/Avastin prior to a restaging evaluation. We will check the CEA when he returns on 12/27/2013.   Thornton Papas, MD  12/05/2013  5:55 PM

## 2013-12-05 NOTE — Telephone Encounter (Signed)
Per staff message and POF I have scheduled appts.  JMW  

## 2013-12-06 ENCOUNTER — Telehealth: Payer: Self-pay | Admitting: *Deleted

## 2013-12-06 ENCOUNTER — Ambulatory Visit: Payer: 59 | Admitting: Physical Therapy

## 2013-12-06 NOTE — Telephone Encounter (Signed)
Per staff message and POF I have scheduled appts.  JMW  

## 2013-12-07 ENCOUNTER — Telehealth: Payer: Self-pay | Admitting: Oncology

## 2013-12-07 NOTE — Telephone Encounter (Signed)
S/w the pt and he is aware of his jan 2015 appts.

## 2013-12-13 ENCOUNTER — Other Ambulatory Visit (HOSPITAL_BASED_OUTPATIENT_CLINIC_OR_DEPARTMENT_OTHER): Payer: 59

## 2013-12-13 ENCOUNTER — Ambulatory Visit (HOSPITAL_BASED_OUTPATIENT_CLINIC_OR_DEPARTMENT_OTHER): Payer: 59

## 2013-12-13 VITALS — BP 155/88 | HR 91 | Temp 97.9°F | Resp 20

## 2013-12-13 DIAGNOSIS — Z5112 Encounter for antineoplastic immunotherapy: Secondary | ICD-10-CM

## 2013-12-13 DIAGNOSIS — C20 Malignant neoplasm of rectum: Secondary | ICD-10-CM

## 2013-12-13 DIAGNOSIS — C7800 Secondary malignant neoplasm of unspecified lung: Secondary | ICD-10-CM

## 2013-12-13 DIAGNOSIS — C787 Secondary malignant neoplasm of liver and intrahepatic bile duct: Secondary | ICD-10-CM

## 2013-12-13 DIAGNOSIS — Z5111 Encounter for antineoplastic chemotherapy: Secondary | ICD-10-CM

## 2013-12-13 DIAGNOSIS — C19 Malignant neoplasm of rectosigmoid junction: Secondary | ICD-10-CM

## 2013-12-13 LAB — COMPREHENSIVE METABOLIC PANEL (CC13)
ALK PHOS: 110 U/L (ref 40–150)
ALT: 14 U/L (ref 0–55)
ANION GAP: 7 meq/L (ref 3–11)
AST: 12 U/L (ref 5–34)
Albumin: 3.4 g/dL — ABNORMAL LOW (ref 3.5–5.0)
BUN: 23.3 mg/dL (ref 7.0–26.0)
CO2: 23 mEq/L (ref 22–29)
CREATININE: 1.6 mg/dL — AB (ref 0.7–1.3)
Calcium: 9.1 mg/dL (ref 8.4–10.4)
Chloride: 110 mEq/L — ABNORMAL HIGH (ref 98–109)
Glucose: 121 mg/dl (ref 70–140)
Potassium: 4.2 mEq/L (ref 3.5–5.1)
Sodium: 140 mEq/L (ref 136–145)
Total Bilirubin: 0.49 mg/dL (ref 0.20–1.20)
Total Protein: 7.3 g/dL (ref 6.4–8.3)

## 2013-12-13 LAB — CBC WITH DIFFERENTIAL/PLATELET
BASO%: 0.4 % (ref 0.0–2.0)
Basophils Absolute: 0 10*3/uL (ref 0.0–0.1)
EOS ABS: 0.1 10*3/uL (ref 0.0–0.5)
EOS%: 0.6 % (ref 0.0–7.0)
HCT: 35.7 % — ABNORMAL LOW (ref 38.4–49.9)
HGB: 12 g/dL — ABNORMAL LOW (ref 13.0–17.1)
LYMPH%: 11.3 % — AB (ref 14.0–49.0)
MCH: 34.2 pg — ABNORMAL HIGH (ref 27.2–33.4)
MCHC: 33.7 g/dL (ref 32.0–36.0)
MCV: 101.4 fL — ABNORMAL HIGH (ref 79.3–98.0)
MONO#: 0.6 10*3/uL (ref 0.1–0.9)
MONO%: 7.3 % (ref 0.0–14.0)
NEUT%: 80.4 % — AB (ref 39.0–75.0)
NEUTROS ABS: 7 10*3/uL — AB (ref 1.5–6.5)
PLATELETS: 202 10*3/uL (ref 140–400)
RBC: 3.52 10*6/uL — ABNORMAL LOW (ref 4.20–5.82)
RDW: 16.7 % — ABNORMAL HIGH (ref 11.0–14.6)
WBC: 8.7 10*3/uL (ref 4.0–10.3)
lymph#: 1 10*3/uL (ref 0.9–3.3)

## 2013-12-13 MED ORDER — FLUOROURACIL CHEMO INJECTION 2.5 GM/50ML
320.0000 mg/m2 | Freq: Once | INTRAVENOUS | Status: AC
  Administered 2013-12-13: 650 mg via INTRAVENOUS
  Filled 2013-12-13: qty 13

## 2013-12-13 MED ORDER — PALONOSETRON HCL INJECTION 0.25 MG/5ML
INTRAVENOUS | Status: AC
  Filled 2013-12-13: qty 5

## 2013-12-13 MED ORDER — SODIUM CHLORIDE 0.9 % IV SOLN
1920.0000 mg/m2 | INTRAVENOUS | Status: DC
  Administered 2013-12-13: 3900 mg via INTRAVENOUS
  Filled 2013-12-13: qty 78

## 2013-12-13 MED ORDER — DEXAMETHASONE SODIUM PHOSPHATE 20 MG/5ML IJ SOLN
20.0000 mg | Freq: Once | INTRAMUSCULAR | Status: AC
  Administered 2013-12-13: 20 mg via INTRAVENOUS

## 2013-12-13 MED ORDER — SODIUM CHLORIDE 0.9 % IV SOLN
5.0000 mg/kg | Freq: Once | INTRAVENOUS | Status: AC
  Administered 2013-12-13: 425 mg via INTRAVENOUS
  Filled 2013-12-13: qty 17

## 2013-12-13 MED ORDER — DEXAMETHASONE SODIUM PHOSPHATE 20 MG/5ML IJ SOLN
INTRAMUSCULAR | Status: AC
  Filled 2013-12-13: qty 5

## 2013-12-13 MED ORDER — ATROPINE SULFATE 1 MG/ML IJ SOLN
0.5000 mg | Freq: Once | INTRAMUSCULAR | Status: AC | PRN
  Administered 2013-12-13: 0.5 mg via INTRAVENOUS

## 2013-12-13 MED ORDER — DEXTROSE 5 % IV SOLN
322.0000 mg/m2 | Freq: Once | INTRAVENOUS | Status: AC
  Administered 2013-12-13: 650 mg via INTRAVENOUS
  Filled 2013-12-13: qty 32.5

## 2013-12-13 MED ORDER — IRINOTECAN HCL CHEMO INJECTION 100 MG/5ML
144.0000 mg/m2 | Freq: Once | INTRAVENOUS | Status: AC
  Administered 2013-12-13: 290 mg via INTRAVENOUS
  Filled 2013-12-13: qty 14.5

## 2013-12-13 MED ORDER — ATROPINE SULFATE 1 MG/ML IJ SOLN
INTRAMUSCULAR | Status: AC
  Filled 2013-12-13: qty 1

## 2013-12-13 MED ORDER — SODIUM CHLORIDE 0.9 % IV SOLN
Freq: Once | INTRAVENOUS | Status: AC
  Administered 2013-12-13: 14:00:00 via INTRAVENOUS

## 2013-12-13 MED ORDER — PALONOSETRON HCL INJECTION 0.25 MG/5ML
0.2500 mg | Freq: Once | INTRAVENOUS | Status: AC
  Administered 2013-12-13: 0.25 mg via INTRAVENOUS

## 2013-12-13 NOTE — Patient Instructions (Signed)
[name] [name] Discharge Instructions for Patients Receiving Chemotherapy  Today you received the following chemotherapy agents Avastin/Leucovorin/Irinotecan/5FU  To help prevent nausea and vomiting after your treatment, we encourage you to take your nausea medication as prescribed.   If you develop nausea and vomiting that is not controlled by your nausea medication, call the clinic.   BELOW ARE SYMPTOMS THAT SHOULD BE REPORTED IMMEDIATELY:  *FEVER GREATER THAN 100.5 F  *CHILLS WITH OR WITHOUT FEVER  NAUSEA AND VOMITING THAT IS NOT CONTROLLED WITH YOUR NAUSEA MEDICATION  *UNUSUAL SHORTNESS OF BREATH  *UNUSUAL BRUISING OR BLEEDING  TENDERNESS IN MOUTH AND THROAT WITH OR WITHOUT PRESENCE OF ULCERS  *URINARY PROBLEMS  *BOWEL PROBLEMS  UNUSUAL RASH Items with * indicate a potential emergency and should be followed up as soon as possible.  Feel free to call the clinic you have any questions or concerns. The clinic phone number is [phone].

## 2013-12-15 ENCOUNTER — Ambulatory Visit (HOSPITAL_BASED_OUTPATIENT_CLINIC_OR_DEPARTMENT_OTHER): Payer: 59

## 2013-12-15 VITALS — BP 137/77 | HR 91 | Temp 97.0°F

## 2013-12-15 DIAGNOSIS — Z452 Encounter for adjustment and management of vascular access device: Secondary | ICD-10-CM

## 2013-12-15 DIAGNOSIS — C20 Malignant neoplasm of rectum: Secondary | ICD-10-CM

## 2013-12-15 MED ORDER — SODIUM CHLORIDE 0.9 % IJ SOLN
10.0000 mL | INTRAMUSCULAR | Status: DC | PRN
  Administered 2013-12-15: 10 mL
  Filled 2013-12-15: qty 10

## 2013-12-15 MED ORDER — HEPARIN SOD (PORK) LOCK FLUSH 100 UNIT/ML IV SOLN
500.0000 [IU] | Freq: Once | INTRAVENOUS | Status: AC | PRN
  Administered 2013-12-15: 500 [IU]
  Filled 2013-12-15: qty 5

## 2013-12-20 ENCOUNTER — Ambulatory Visit: Payer: 59 | Attending: Oncology | Admitting: Physical Therapy

## 2013-12-20 DIAGNOSIS — IMO0001 Reserved for inherently not codable concepts without codable children: Secondary | ICD-10-CM | POA: Insufficient documentation

## 2013-12-20 DIAGNOSIS — M2420 Disorder of ligament, unspecified site: Secondary | ICD-10-CM | POA: Insufficient documentation

## 2013-12-20 DIAGNOSIS — M629 Disorder of muscle, unspecified: Secondary | ICD-10-CM | POA: Insufficient documentation

## 2013-12-25 ENCOUNTER — Other Ambulatory Visit: Payer: Self-pay | Admitting: Oncology

## 2013-12-27 ENCOUNTER — Telehealth: Payer: Self-pay | Admitting: Oncology

## 2013-12-27 ENCOUNTER — Other Ambulatory Visit (HOSPITAL_BASED_OUTPATIENT_CLINIC_OR_DEPARTMENT_OTHER): Payer: 59

## 2013-12-27 ENCOUNTER — Ambulatory Visit: Payer: 59

## 2013-12-27 ENCOUNTER — Telehealth: Payer: Self-pay | Admitting: *Deleted

## 2013-12-27 ENCOUNTER — Ambulatory Visit (HOSPITAL_BASED_OUTPATIENT_CLINIC_OR_DEPARTMENT_OTHER): Payer: 59 | Admitting: Oncology

## 2013-12-27 VITALS — BP 166/89 | HR 89 | Temp 98.0°F | Resp 18 | Ht 70.0 in | Wt 176.1 lb

## 2013-12-27 DIAGNOSIS — C779 Secondary and unspecified malignant neoplasm of lymph node, unspecified: Secondary | ICD-10-CM

## 2013-12-27 DIAGNOSIS — C20 Malignant neoplasm of rectum: Secondary | ICD-10-CM

## 2013-12-27 DIAGNOSIS — C7800 Secondary malignant neoplasm of unspecified lung: Secondary | ICD-10-CM

## 2013-12-27 DIAGNOSIS — E876 Hypokalemia: Secondary | ICD-10-CM

## 2013-12-27 DIAGNOSIS — C787 Secondary malignant neoplasm of liver and intrahepatic bile duct: Secondary | ICD-10-CM

## 2013-12-27 DIAGNOSIS — I10 Essential (primary) hypertension: Secondary | ICD-10-CM

## 2013-12-27 DIAGNOSIS — J321 Chronic frontal sinusitis: Secondary | ICD-10-CM

## 2013-12-27 LAB — CBC WITH DIFFERENTIAL/PLATELET
BASO%: 0.7 % (ref 0.0–2.0)
BASOS ABS: 0 10*3/uL (ref 0.0–0.1)
EOS%: 4.7 % (ref 0.0–7.0)
Eosinophils Absolute: 0.2 10*3/uL (ref 0.0–0.5)
HEMATOCRIT: 37.1 % — AB (ref 38.4–49.9)
HGB: 12.3 g/dL — ABNORMAL LOW (ref 13.0–17.1)
LYMPH%: 25.1 % (ref 14.0–49.0)
MCH: 34 pg — AB (ref 27.2–33.4)
MCHC: 33.1 g/dL (ref 32.0–36.0)
MCV: 102.7 fL — ABNORMAL HIGH (ref 79.3–98.0)
MONO#: 0.3 10*3/uL (ref 0.1–0.9)
MONO%: 7.4 % (ref 0.0–14.0)
NEUT#: 2.4 10*3/uL (ref 1.5–6.5)
NEUT%: 62.1 % (ref 39.0–75.0)
PLATELETS: 183 10*3/uL (ref 140–400)
RBC: 3.61 10*6/uL — ABNORMAL LOW (ref 4.20–5.82)
RDW: 16.7 % — ABNORMAL HIGH (ref 11.0–14.6)
WBC: 3.9 10*3/uL — ABNORMAL LOW (ref 4.0–10.3)
lymph#: 1 10*3/uL (ref 0.9–3.3)

## 2013-12-27 LAB — COMPREHENSIVE METABOLIC PANEL (CC13)
ALK PHOS: 101 U/L (ref 40–150)
ALT: 12 U/L (ref 0–55)
AST: 14 U/L (ref 5–34)
Albumin: 3.5 g/dL (ref 3.5–5.0)
Anion Gap: 10 mEq/L (ref 3–11)
BUN: 19.3 mg/dL (ref 7.0–26.0)
CALCIUM: 8.9 mg/dL (ref 8.4–10.4)
CO2: 26 mEq/L (ref 22–29)
CREATININE: 1.4 mg/dL — AB (ref 0.7–1.3)
Chloride: 108 mEq/L (ref 98–109)
Glucose: 112 mg/dl (ref 70–140)
Potassium: 3.4 mEq/L — ABNORMAL LOW (ref 3.5–5.1)
Sodium: 144 mEq/L (ref 136–145)
Total Bilirubin: 0.51 mg/dL (ref 0.20–1.20)
Total Protein: 7 g/dL (ref 6.4–8.3)

## 2013-12-27 NOTE — Progress Notes (Signed)
[facility]    OFFICE PROGRESS NOTE   INTERVAL HISTORY:   Mr. Ellithorpe completed another cycle of FOLFIRI/Avastin on 12/13/2013. He has intermittent diarrhea. The diarrhea improved with Lomotil.he complains of sinus drainage and nasal congestion. He has discomfort between the eyes.  Objective:  Vital signs in last 24 hours:  Blood pressure 166/89, pulse 89, temperature 98 F (36.7 C), temperature source Oral, resp. rate 18, height $RemoveBe'5\' 10"'WjgsmTlgb$  (1.778 m), weight 176 lb 1.6 oz (79.878 kg), SpO2 99.00%.    HEENT: no sinus tenderness, no thrush or ulcers Resp: lungs clear bilaterally, distant breath sounds Cardio: regular rate and rhythm GI: no hepatomegaly, nontender Vascular: no leg edema    Portacath/PICC-without erythema  Lab Results:  Lab Results  Component Value Date   WBC 3.9* 12/27/2013   HGB 12.3* 12/27/2013   HCT 37.1* 12/27/2013   MCV 102.7* 12/27/2013   PLT 183 12/27/2013   NEUTROABS 2.4 12/27/2013      Medications: I have reviewed the patient's current medications.  Assessment/Plan: 1. Metastatic rectal cancer status post biopsy of rectal mass 09/03/2011 with pathology showing invasive adenocarcinoma. Staging CT scans 09/03/2011 showed a 10 mm hypodense rounded lesion in left lateral hepatic lobe, similar 6 mm lesion in the superior right hepatic lobe and a smaller subcapsular lesion in the lateral right hepatic lobe; rectal mass within the lumen of the bowel emanating from the left wall measuring 4.9 x 3.5 cm and a mass within the low left perirectal fat measuring 2.3 x 3.2 cm consistent with local extension of carcinoma. Staging PET scan 09/18/2011 showed a 5.1 cm distal sigmoid/rectal mass with max SUV 19.2 with local extension into the left perirectal fat with max SUV 7.7. At least 2 suspected hepatic metastases with max SUV 6.7 in the lateral segment left hepatic lobe and max SUV 4.1 in the lateral right hepatic dome. Additional tiny hypodensities in the  liver may be cysts; patchy/nodular opacity in the left lower lobe with max SUV 3.5 worrisome for pulmonary metastasis, less likely infectious. He began radiation and concurrent Xeloda chemotherapy on 09/29/2011. He completed radiation on 11/06/2011. MRI of the liver on 12/08/2011 showed 2 hypermetabolic liver lesions, similar in size to 09/03/2011. 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 Equivocal 7 mm lesion in the inferior right hepatic lobe. No evidence of extrahepatic metastasis within the abdomen. An ultrasound-guided biopsy of a small left hepatic lesion on 12/23/2011 was nondiagnostic. He underwent a low anterior resection with loop ileostomy on 02/06/2012 with final pathology showing a 3.5 cm invasive adenocarcinoma with abundant extracellular mucin invading through the muscularis propria into pericolonic fatty tissue; 4 of 10 pericolonic lymph nodes were positive for metastatic carcinoma; resection margins were clear (ypT3,ypN2). He began FOLFOX chemotherapy on 03/16/2012. He completed 2 cycles of FOLFOX chemotherapy and underwent an ileostomy reversal 05/05/2012. FOLFOX was resumed on 06/14/2012 and completed on 09/14/2012. 2. CEA 13.8 on 09/17/2011; 1.6 on 12/01/2011; 8.4 on 03/09/2012, 2.1 on 05/03/2012; 2.5 on 07/26/2012; 1.8 on 10/12/2012. 3.0 on 11/23/2012. CEA elevated at 25.4 on 02/22/2013, 44.7 on 04/04/2013, and 59.1 on 05/17/2013. CEA improved at 16.8 on 08/10/2013 and 10.8 on 09/23/2013. CEA improved at 8.9 on 10/18/2013. 3. CT chest/abdomen/pelvis 03/10/2013 with 2 new left lower lobe nodules and 2 liver lesions increased in size. Progressive metastatic disease in the lungs and liver on a restaging CT 06/07/2013. He began treatment with FOLFIRI/Avastin on 06/21/2013. Restaging CT 09/23/2013 after 5 cycles  of FOLFIRI/Avastin, with stable lung lesions and a decrease in the size of dominant liver lesions.  Cycle 6 FOLFIRI/Avastin  09/27/2013.  Cycle 7 FOLFIRI/Avastin 10/18/2013.  Cycle 8 FOLFIRI/Avastin 11/09/2013 Cycle 9 FOLFIRI/Avastin 12/13/2013 4. K-ras mutation detected. 5. Rectal pain and bleeding secondary to #1. Resolved. 6. Frequent loose stools secondary to #1. Improved. 7. Hypertension. 8. Tobacco use. 9. CT scan 09/03/2011 with chronic right hydronephrosis secondary to an obstructing calculus in the mid right ureter. He is followed by Dr. Janice Norrie. Stable on the CT 06/07/2013 10. Status post cystoscopy, right retrograde pyelogram, ureteroscopy and insertion of right double-J stent 11/28/2011. The double-J stent was replaced 02/06/2012. The stent has been removed. 11. Elevated BUN/creatinine. Likely secondary to dehydration while the ileostomy was in place. He now appears to have chronic renal insufficiency. 12. Status post ultrasound-guided fine-needle aspiration of small left hepatic lesion 12/22/2011. The pathology revealed no malignancy. 13. Malaise and weight loss. Resolved. 14. High output ileostomy. Partially improved with Imodium. Status post ileostomy reversal 05/05/2012. 15. Erectile dysfunction. He is followed by Dr. Janice Norrie. 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 17. Diarrhea after FOLFOX chemotherapy given on 07/26/2012-resolved. The bolus and infusional 5-FU were dose reduced by 20% beginning with FOLFOX on 08/17/2012.  18. History of an Irregular heart rate- Status post evaluation by Dr. Sarajane Jews on 11/02/2012 with findings of sinus rhythm with occasional PACs. 19. Diarrhea following FOLFIRI/Avastin. He reports the diarrhea is relieved with Lomotil.  20. "Sinus congestion". He has chronic sinus symptoms and currently has increased drainage and sinus pain 21. Hypokalemia 07/05/2013. He continues a potassium supplement.  Edna noted when here on 10/18/2013. He completed a 4 day course of Diflucan. 23. Intermittent bilateral shoulder pain. Question arthritis.     Disposition:  [name] [name] currently has a flare of chronic sinus symptoms. He request a delay of the next cycle of chemotherapy until 01/03/2014. Chemotherapy will continue on a 3 week schedule. The plan is to obtain a restaging CT evaluation after 2 additional cycles of FOLFIRI/Avastin.  We will make an ENT referral for evaluation of the sinus symptoms.   Betsy Coder, MD  12/27/2013  9:36 PM

## 2013-12-27 NOTE — Telephone Encounter (Signed)
Gave pt appt for lab and MD for January and February, emailed Sharyn Lull regarding chemo has to be moved from 2/10 to 2/17 and chemo on 1/27

## 2013-12-27 NOTE — Telephone Encounter (Signed)
Per staff message and POF I have scheduled appts.  JMW  

## 2013-12-27 NOTE — Telephone Encounter (Signed)
Talked to pt and gave him appt for chemo on 1/27 and february 2015

## 2013-12-28 LAB — CEA: CEA: 11.6 ng/mL — ABNORMAL HIGH (ref 0.0–5.0)

## 2013-12-30 ENCOUNTER — Other Ambulatory Visit: Payer: Self-pay | Admitting: *Deleted

## 2013-12-30 DIAGNOSIS — C20 Malignant neoplasm of rectum: Secondary | ICD-10-CM

## 2014-01-03 ENCOUNTER — Ambulatory Visit (HOSPITAL_BASED_OUTPATIENT_CLINIC_OR_DEPARTMENT_OTHER): Payer: 59

## 2014-01-03 VITALS — BP 155/84 | HR 104 | Temp 97.1°F

## 2014-01-03 DIAGNOSIS — C787 Secondary malignant neoplasm of liver and intrahepatic bile duct: Secondary | ICD-10-CM

## 2014-01-03 DIAGNOSIS — C7800 Secondary malignant neoplasm of unspecified lung: Secondary | ICD-10-CM

## 2014-01-03 DIAGNOSIS — C779 Secondary and unspecified malignant neoplasm of lymph node, unspecified: Secondary | ICD-10-CM

## 2014-01-03 DIAGNOSIS — C20 Malignant neoplasm of rectum: Secondary | ICD-10-CM

## 2014-01-03 DIAGNOSIS — Z5111 Encounter for antineoplastic chemotherapy: Secondary | ICD-10-CM

## 2014-01-03 DIAGNOSIS — Z5112 Encounter for antineoplastic immunotherapy: Secondary | ICD-10-CM

## 2014-01-03 DIAGNOSIS — C19 Malignant neoplasm of rectosigmoid junction: Secondary | ICD-10-CM

## 2014-01-03 LAB — UA PROTEIN, DIPSTICK - CHCC

## 2014-01-03 MED ORDER — DEXAMETHASONE SODIUM PHOSPHATE 20 MG/5ML IJ SOLN
20.0000 mg | Freq: Once | INTRAMUSCULAR | Status: AC
  Administered 2014-01-03: 20 mg via INTRAVENOUS

## 2014-01-03 MED ORDER — SODIUM CHLORIDE 0.9 % IV SOLN
Freq: Once | INTRAVENOUS | Status: AC
  Administered 2014-01-03: 13:00:00 via INTRAVENOUS

## 2014-01-03 MED ORDER — ACETAMINOPHEN 325 MG PO TABS
650.0000 mg | ORAL_TABLET | Freq: Once | ORAL | Status: AC
  Administered 2014-01-03: 650 mg via ORAL

## 2014-01-03 MED ORDER — ATROPINE SULFATE 1 MG/ML IJ SOLN
0.5000 mg | Freq: Once | INTRAMUSCULAR | Status: AC | PRN
  Administered 2014-01-03: 0.5 mg via INTRAVENOUS

## 2014-01-03 MED ORDER — ACETAMINOPHEN 325 MG PO TABS
ORAL_TABLET | ORAL | Status: AC
  Filled 2014-01-03: qty 2

## 2014-01-03 MED ORDER — ATROPINE SULFATE 1 MG/ML IJ SOLN
INTRAMUSCULAR | Status: AC
  Filled 2014-01-03: qty 1

## 2014-01-03 MED ORDER — LEUCOVORIN CALCIUM INJECTION 350 MG
320.0000 mg/m2 | Freq: Once | INTRAVENOUS | Status: AC
  Administered 2014-01-03: 646 mg via INTRAVENOUS
  Filled 2014-01-03: qty 32.3

## 2014-01-03 MED ORDER — FLUOROURACIL CHEMO INJECTION 2.5 GM/50ML
320.0000 mg/m2 | Freq: Once | INTRAVENOUS | Status: AC
  Administered 2014-01-03: 650 mg via INTRAVENOUS
  Filled 2014-01-03: qty 13

## 2014-01-03 MED ORDER — FLUOROURACIL CHEMO INJECTION 5 GM/100ML
1920.0000 mg/m2 | INTRAVENOUS | Status: DC
  Administered 2014-01-03: 3900 mg via INTRAVENOUS
  Filled 2014-01-03: qty 78

## 2014-01-03 MED ORDER — IRINOTECAN HCL CHEMO INJECTION 100 MG/5ML
144.0000 mg/m2 | Freq: Once | INTRAVENOUS | Status: AC
  Administered 2014-01-03: 290 mg via INTRAVENOUS
  Filled 2014-01-03: qty 14.5

## 2014-01-03 MED ORDER — DEXAMETHASONE SODIUM PHOSPHATE 20 MG/5ML IJ SOLN
INTRAMUSCULAR | Status: AC
  Filled 2014-01-03: qty 5

## 2014-01-03 MED ORDER — PALONOSETRON HCL INJECTION 0.25 MG/5ML
0.2500 mg | Freq: Once | INTRAVENOUS | Status: AC
  Administered 2014-01-03: 0.25 mg via INTRAVENOUS

## 2014-01-03 MED ORDER — SODIUM CHLORIDE 0.9 % IV SOLN
5.0000 mg/kg | Freq: Once | INTRAVENOUS | Status: AC
  Administered 2014-01-03: 425 mg via INTRAVENOUS
  Filled 2014-01-03: qty 17

## 2014-01-03 MED ORDER — PALONOSETRON HCL INJECTION 0.25 MG/5ML
INTRAVENOUS | Status: AC
  Filled 2014-01-03: qty 5

## 2014-01-03 NOTE — Patient Instructions (Signed)
[facility] Discharge Instructions for Patients Receiving Chemotherapy  Today you received the following chemotherapy agents Avastin/5 FU/Leucovorin/Irinotecan To help prevent nausea and vomiting after your treatment, we encourage you to take your nausea medication as prescribed.  If you develop nausea and vomiting that is not controlled by your nausea medication, call the clinic.   BELOW ARE SYMPTOMS THAT SHOULD BE REPORTED IMMEDIATELY:  *FEVER GREATER THAN 100.5 F  *CHILLS WITH OR WITHOUT FEVER  NAUSEA AND VOMITING THAT IS NOT CONTROLLED WITH YOUR NAUSEA MEDICATION  *UNUSUAL SHORTNESS OF BREATH  *UNUSUAL BRUISING OR BLEEDING  TENDERNESS IN MOUTH AND THROAT WITH OR WITHOUT PRESENCE OF ULCERS  *URINARY PROBLEMS  *BOWEL PROBLEMS  UNUSUAL RASH Items with * indicate a potential emergency and should be followed up as soon as possible.  Feel free to call the clinic you have any questions or concerns. The clinic phone number is [phone].

## 2014-01-03 NOTE — Progress Notes (Signed)
Patient complains of headache stating it started about 30 minutes ago. Patient rates his headache pain a 9 on a 0 to 10 pain scale. Dr. Benay Spice notified. Order given and carried out for Tylenol 650 mg PO per Dr. Benay Spice.

## 2014-01-05 ENCOUNTER — Encounter (INDEPENDENT_AMBULATORY_CARE_PROVIDER_SITE_OTHER): Payer: Self-pay

## 2014-01-05 ENCOUNTER — Ambulatory Visit (HOSPITAL_BASED_OUTPATIENT_CLINIC_OR_DEPARTMENT_OTHER): Payer: 59

## 2014-01-05 VITALS — BP 159/81 | HR 86 | Temp 98.3°F

## 2014-01-05 DIAGNOSIS — C7800 Secondary malignant neoplasm of unspecified lung: Secondary | ICD-10-CM

## 2014-01-05 DIAGNOSIS — C779 Secondary and unspecified malignant neoplasm of lymph node, unspecified: Secondary | ICD-10-CM

## 2014-01-05 DIAGNOSIS — C787 Secondary malignant neoplasm of liver and intrahepatic bile duct: Secondary | ICD-10-CM

## 2014-01-05 DIAGNOSIS — C20 Malignant neoplasm of rectum: Secondary | ICD-10-CM

## 2014-01-05 MED ORDER — SODIUM CHLORIDE 0.9 % IJ SOLN
10.0000 mL | INTRAMUSCULAR | Status: DC | PRN
  Administered 2014-01-05: 10 mL
  Filled 2014-01-05: qty 10

## 2014-01-05 MED ORDER — HEPARIN SOD (PORK) LOCK FLUSH 100 UNIT/ML IV SOLN
500.0000 [IU] | Freq: Once | INTRAVENOUS | Status: AC | PRN
  Administered 2014-01-05: 500 [IU]
  Filled 2014-01-05: qty 5

## 2014-01-05 NOTE — Patient Instructions (Signed)

## 2014-01-06 ENCOUNTER — Telehealth: Payer: Self-pay | Admitting: Oncology

## 2014-01-06 NOTE — Telephone Encounter (Signed)
Talked to pt gave him appt to see Dr. Wilburn Cornelia on 01/09/14 9:50 am gave referral to HIM

## 2014-01-06 NOTE — Telephone Encounter (Signed)
MEDICAL RECORDS FAXED TO DR. Wilburn Cornelia [phone]

## 2014-01-17 ENCOUNTER — Other Ambulatory Visit: Payer: 59

## 2014-01-17 ENCOUNTER — Ambulatory Visit: Payer: 59 | Admitting: Nurse Practitioner

## 2014-01-17 ENCOUNTER — Ambulatory Visit: Payer: 59

## 2014-01-21 ENCOUNTER — Other Ambulatory Visit: Payer: Self-pay | Admitting: Oncology

## 2014-01-24 ENCOUNTER — Telehealth: Payer: Self-pay | Admitting: *Deleted

## 2014-01-24 ENCOUNTER — Other Ambulatory Visit: Payer: 59

## 2014-01-24 ENCOUNTER — Ambulatory Visit: Payer: 59 | Admitting: Nurse Practitioner

## 2014-01-24 ENCOUNTER — Ambulatory Visit: Payer: 59

## 2014-01-24 NOTE — Telephone Encounter (Signed)
Per scheduler I have moved the treatment appt from today to Monday.  JMW

## 2014-01-25 ENCOUNTER — Telehealth: Payer: Self-pay | Admitting: Oncology

## 2014-01-25 NOTE — Telephone Encounter (Signed)
Talked to pt he is aware of appt for February , lab,md and chemo

## 2014-01-30 ENCOUNTER — Telehealth: Payer: Self-pay | Admitting: Oncology

## 2014-01-30 ENCOUNTER — Other Ambulatory Visit (HOSPITAL_BASED_OUTPATIENT_CLINIC_OR_DEPARTMENT_OTHER): Payer: Medicare Other

## 2014-01-30 ENCOUNTER — Ambulatory Visit (HOSPITAL_BASED_OUTPATIENT_CLINIC_OR_DEPARTMENT_OTHER): Payer: Medicare Other | Admitting: Nurse Practitioner

## 2014-01-30 ENCOUNTER — Encounter (INDEPENDENT_AMBULATORY_CARE_PROVIDER_SITE_OTHER): Payer: Self-pay

## 2014-01-30 ENCOUNTER — Ambulatory Visit (HOSPITAL_BASED_OUTPATIENT_CLINIC_OR_DEPARTMENT_OTHER): Payer: Medicare Other

## 2014-01-30 VITALS — BP 165/85 | HR 101 | Temp 97.6°F | Resp 18 | Ht 70.0 in | Wt 178.7 lb

## 2014-01-30 DIAGNOSIS — C20 Malignant neoplasm of rectum: Secondary | ICD-10-CM

## 2014-01-30 DIAGNOSIS — E876 Hypokalemia: Secondary | ICD-10-CM

## 2014-01-30 DIAGNOSIS — Z5112 Encounter for antineoplastic immunotherapy: Secondary | ICD-10-CM

## 2014-01-30 DIAGNOSIS — C7800 Secondary malignant neoplasm of unspecified lung: Secondary | ICD-10-CM

## 2014-01-30 DIAGNOSIS — G620 Drug-induced polyneuropathy: Secondary | ICD-10-CM

## 2014-01-30 DIAGNOSIS — Z5111 Encounter for antineoplastic chemotherapy: Secondary | ICD-10-CM

## 2014-01-30 DIAGNOSIS — C787 Secondary malignant neoplasm of liver and intrahepatic bile duct: Secondary | ICD-10-CM

## 2014-01-30 LAB — COMPREHENSIVE METABOLIC PANEL (CC13)
ALT: 36 U/L (ref 0–55)
AST: 27 U/L (ref 5–34)
Albumin: 3.5 g/dL (ref 3.5–5.0)
Alkaline Phosphatase: 154 U/L — ABNORMAL HIGH (ref 40–150)
Anion Gap: 11 mEq/L (ref 3–11)
BILIRUBIN TOTAL: 0.55 mg/dL (ref 0.20–1.20)
BUN: 22.8 mg/dL (ref 7.0–26.0)
CO2: 23 mEq/L (ref 22–29)
CREATININE: 1.3 mg/dL (ref 0.7–1.3)
Calcium: 9 mg/dL (ref 8.4–10.4)
Chloride: 110 mEq/L — ABNORMAL HIGH (ref 98–109)
Glucose: 124 mg/dl (ref 70–140)
Potassium: 3.4 mEq/L — ABNORMAL LOW (ref 3.5–5.1)
Sodium: 145 mEq/L (ref 136–145)
Total Protein: 7.1 g/dL (ref 6.4–8.3)

## 2014-01-30 LAB — CBC WITH DIFFERENTIAL/PLATELET
BASO%: 0.7 % (ref 0.0–2.0)
BASOS ABS: 0 10*3/uL (ref 0.0–0.1)
EOS%: 2.2 % (ref 0.0–7.0)
Eosinophils Absolute: 0.2 10*3/uL (ref 0.0–0.5)
HCT: 38.7 % (ref 38.4–49.9)
HEMOGLOBIN: 13.3 g/dL (ref 13.0–17.1)
LYMPH%: 13.2 % — ABNORMAL LOW (ref 14.0–49.0)
MCH: 34.9 pg — AB (ref 27.2–33.4)
MCHC: 34.3 g/dL (ref 32.0–36.0)
MCV: 101.5 fL — AB (ref 79.3–98.0)
MONO#: 0.4 10*3/uL (ref 0.1–0.9)
MONO%: 6.1 % (ref 0.0–14.0)
NEUT#: 5.6 10*3/uL (ref 1.5–6.5)
NEUT%: 77.8 % — ABNORMAL HIGH (ref 39.0–75.0)
Platelets: 173 10*3/uL (ref 140–400)
RBC: 3.81 10*6/uL — ABNORMAL LOW (ref 4.20–5.82)
RDW: 15.4 % — ABNORMAL HIGH (ref 11.0–14.6)
WBC: 7.1 10*3/uL (ref 4.0–10.3)
lymph#: 0.9 10*3/uL (ref 0.9–3.3)

## 2014-01-30 LAB — UA PROTEIN, DIPSTICK - CHCC: Protein, ur: 30 mg/dL

## 2014-01-30 MED ORDER — DEXAMETHASONE SODIUM PHOSPHATE 20 MG/5ML IJ SOLN
INTRAMUSCULAR | Status: AC
  Filled 2014-01-30: qty 5

## 2014-01-30 MED ORDER — SODIUM CHLORIDE 0.9 % IV SOLN
Freq: Once | INTRAVENOUS | Status: AC
  Administered 2014-01-30: 12:00:00 via INTRAVENOUS

## 2014-01-30 MED ORDER — FLUOROURACIL CHEMO INJECTION 2.5 GM/50ML
320.0000 mg/m2 | Freq: Once | INTRAVENOUS | Status: AC
Start: 2014-01-30 — End: 2014-01-30
  Administered 2014-01-30: 650 mg via INTRAVENOUS
  Filled 2014-01-30: qty 13

## 2014-01-30 MED ORDER — ATROPINE SULFATE 1 MG/ML IJ SOLN
INTRAMUSCULAR | Status: AC
  Filled 2014-01-30: qty 1

## 2014-01-30 MED ORDER — ATROPINE SULFATE 1 MG/ML IJ SOLN
0.5000 mg | Freq: Once | INTRAMUSCULAR | Status: AC | PRN
  Administered 2014-01-30: 0.5 mg via INTRAVENOUS

## 2014-01-30 MED ORDER — PALONOSETRON HCL INJECTION 0.25 MG/5ML
INTRAVENOUS | Status: AC
  Filled 2014-01-30: qty 5

## 2014-01-30 MED ORDER — DEXAMETHASONE SODIUM PHOSPHATE 20 MG/5ML IJ SOLN
20.0000 mg | Freq: Once | INTRAMUSCULAR | Status: AC
  Administered 2014-01-30: 20 mg via INTRAVENOUS

## 2014-01-30 MED ORDER — DEXTROSE 5 % IV SOLN
317.0000 mg/m2 | Freq: Once | INTRAVENOUS | Status: AC
  Administered 2014-01-30: 640 mg via INTRAVENOUS
  Filled 2014-01-30: qty 32

## 2014-01-30 MED ORDER — PALONOSETRON HCL INJECTION 0.25 MG/5ML
0.2500 mg | Freq: Once | INTRAVENOUS | Status: AC
  Administered 2014-01-30: 0.25 mg via INTRAVENOUS

## 2014-01-30 MED ORDER — IRINOTECAN HCL CHEMO INJECTION 100 MG/5ML
144.0000 mg/m2 | Freq: Once | INTRAVENOUS | Status: AC
  Administered 2014-01-30: 290 mg via INTRAVENOUS
  Filled 2014-01-30: qty 14.5

## 2014-01-30 MED ORDER — SODIUM CHLORIDE 0.9 % IV SOLN
1920.0000 mg/m2 | INTRAVENOUS | Status: DC
  Administered 2014-01-30: 3900 mg via INTRAVENOUS
  Filled 2014-01-30: qty 78

## 2014-01-30 MED ORDER — SODIUM CHLORIDE 0.9 % IV SOLN
5.0000 mg/kg | Freq: Once | INTRAVENOUS | Status: AC
  Administered 2014-01-30: 425 mg via INTRAVENOUS
  Filled 2014-01-30: qty 17

## 2014-01-30 NOTE — Patient Instructions (Signed)
[facility] Discharge Instructions for Patients Receiving Chemotherapy  Today you received the following chemotherapy agents Avastin, Leucovorin, Irinotecan, 5-FU.   To help prevent nausea and vomiting after your treatment, we encourage you to take your nausea medication as prescribed.    If you develop nausea and vomiting that is not controlled by your nausea medication, call the clinic.   BELOW ARE SYMPTOMS THAT SHOULD BE REPORTED IMMEDIATELY:  *FEVER GREATER THAN 100.5 F  *CHILLS WITH OR WITHOUT FEVER  NAUSEA AND VOMITING THAT IS NOT CONTROLLED WITH YOUR NAUSEA MEDICATION  *UNUSUAL SHORTNESS OF BREATH  *UNUSUAL BRUISING OR BLEEDING  TENDERNESS IN MOUTH AND THROAT WITH OR WITHOUT PRESENCE OF ULCERS  *URINARY PROBLEMS  *BOWEL PROBLEMS  UNUSUAL RASH Items with * indicate a potential emergency and should be followed up as soon as possible.  Feel free to call the clinic should you have any questions or concerns. The clinic phone number is [phone].  It was my pleasure to take care of you today!  Leeanne Rio, RN

## 2014-01-30 NOTE — Progress Notes (Signed)
OFFICE PROGRESS NOTE  Interval history:  Mr. Debold returns for followup of metastatic rectal cancer. He completed cycle 10 FOLFIRI/Avastin on 01/03/2014. He had nausea intermittently for several days. He continues to have intermittent diarrhea. He takes Lomotil as needed. He denies mouth sores. No skin rash. No shortness of breath or chest pain. He denies leg swelling and calf pain. No bleeding. Sinus symptoms and discomfort between the eyes are better. He completed a course of antibiotics and is now using a nasal spray.   Objective: Filed Vitals:   01/30/14 1005  BP: 165/85  Pulse: 101  Temp: 97.6 F (36.4 C)  Resp: 18   Oropharynx is without thrush or ulceration. No palpable cervical or supraclavicular lymph nodes. Lungs are clear. Regular cardiac rhythm. Port-A-Cath site without erythema. Abdomen soft and nontender. No hepatomegaly. No leg edema.   Lab Results: Lab Results  Component Value Date   WBC 7.1 01/30/2014   HGB 13.3 01/30/2014   HCT 38.7 01/30/2014   MCV 101.5* 01/30/2014   PLT 173 01/30/2014   NEUTROABS 5.6 01/30/2014    Chemistry:    Chemistry      Component Value Date/Time   NA 145 01/30/2014 0953   NA 138 04/04/2013 1650   K 3.4* 01/30/2014 0953   K 4.0 04/04/2013 1650   CL 109* 05/16/2013 0829   CL 105 04/04/2013 1650   CO2 23 01/30/2014 0953   CO2 24 04/04/2013 1650   BUN 22.8 01/30/2014 0953   BUN 28* 04/04/2013 1650   CREATININE 1.3 01/30/2014 0953   CREATININE 1.48* 04/04/2013 1650      Component Value Date/Time   CALCIUM 9.0 01/30/2014 0953   CALCIUM 9.6 04/04/2013 1650   ALKPHOS 154* 01/30/2014 0953   ALKPHOS 99 07/26/2012 1008   AST 27 01/30/2014 0953   AST 13 07/26/2012 1008   ALT 36 01/30/2014 0953   ALT 10 07/26/2012 1008   BILITOT 0.55 01/30/2014 0953   BILITOT 0.5 07/26/2012 1008       Studies/Results: No results found.  Medications: I have reviewed the patient's current medications.  Assessment/Plan: 1. Metastatic rectal cancer status post biopsy  of rectal mass 09/03/2011 with pathology showing invasive adenocarcinoma. Staging CT scans 09/03/2011 showed a 10 mm hypodense rounded lesion in left lateral hepatic lobe, similar 6 mm lesion in the superior right hepatic lobe and a smaller subcapsular lesion in the lateral right hepatic lobe; rectal mass within the lumen of the bowel emanating from the left wall measuring 4.9 x 3.5 cm and a mass within the low left perirectal fat measuring 2.3 x 3.2 cm consistent with local extension of carcinoma. Staging PET scan 09/18/2011 showed a 5.1 cm distal sigmoid/rectal mass with max SUV 19.2 with local extension into the left perirectal fat with max SUV 7.7. At least 2 suspected hepatic metastases with max SUV 6.7 in the lateral segment left hepatic lobe and max SUV 4.1 in the lateral right hepatic dome. Additional tiny hypodensities in the liver may be cysts; patchy/nodular opacity in the left lower lobe with max SUV 3.5 worrisome for pulmonary metastasis, less likely infectious. He began radiation and concurrent Xeloda chemotherapy on 09/29/2011. He completed radiation on 11/06/2011. MRI of the liver on 12/08/2011 showed 2 hypermetabolic liver lesions, similar in size to 09/03/2011. 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 Equivocal 7 mm lesion in the inferior right hepatic lobe. No evidence of extrahepatic metastasis within the abdomen. An ultrasound-guided biopsy of  a small left hepatic lesion on 12/23/2011 was nondiagnostic. He underwent a low anterior resection with loop ileostomy on 02/06/2012 with final pathology showing a 3.5 cm invasive adenocarcinoma with abundant extracellular mucin invading through the muscularis propria into pericolonic fatty tissue; 4 of 10 pericolonic lymph nodes were positive for metastatic carcinoma; resection margins were clear (ypT3,ypN2). He began FOLFOX chemotherapy on 03/16/2012. He completed 2 cycles of FOLFOX  chemotherapy and underwent an ileostomy reversal 05/05/2012. FOLFOX was resumed on 06/14/2012 and completed on 09/14/2012. 2. CEA 13.8 on 09/17/2011; 1.6 on 12/01/2011; 8.4 on 03/09/2012, 2.1 on 05/03/2012; 2.5 on 07/26/2012; 1.8 on 10/12/2012. 3.0 on 11/23/2012. CEA elevated at 25.4 on 02/22/2013, 44.7 on 04/04/2013, and 59.1 on 05/17/2013. CEA improved at 16.8 on 08/10/2013 and 10.8 on 09/23/2013. CEA improved at 8.9 on 10/18/2013. 3. CT chest/abdomen/pelvis 03/10/2013 with 2 new left lower lobe nodules and 2 liver lesions increased in size. Progressive metastatic disease in the lungs and liver on a restaging CT 06/07/2013. He began treatment with FOLFIRI/Avastin on 06/21/2013. Restaging CT 09/23/2013 after 5 cycles of FOLFIRI/Avastin, with stable lung lesions and a decrease in the size of dominant liver lesions.  Cycle 6 FOLFIRI/Avastin 09/27/2013.  Cycle 7 FOLFIRI/Avastin 10/18/2013.  Cycle 8 FOLFIRI/Avastin 11/09/2013.  Cycle 9 FOLFIRI/Avastin 12/13/2013. Cycle 10 FOLFIRI/Avastin 01/03/2014. 4. K-ras mutation detected. 5. Rectal pain and bleeding secondary to #1. Resolved. 6. Frequent loose stools secondary to #1. Improved. 7. Hypertension. 8. Tobacco use. 9. CT scan 09/03/2011 with chronic right hydronephrosis secondary to an obstructing calculus in the mid right ureter. He is followed by Dr. Janice Norrie. Stable on the CT 06/07/2013 10. Status post cystoscopy, right retrograde pyelogram, ureteroscopy and insertion of right double-J stent 11/28/2011. The double-J stent was replaced 02/06/2012. The stent has been removed. 11. Elevated BUN/creatinine. Likely secondary to dehydration while the ileostomy was in place. He now appears to have chronic renal insufficiency. 12. Status post ultrasound-guided fine-needle aspiration of small left hepatic lesion 12/22/2011. The pathology revealed no malignancy. 13. Malaise and weight loss. Resolved. 14. High output ileostomy. Partially improved with Imodium.  Status post ileostomy reversal 05/05/2012. 15. Erectile dysfunction. He is followed by Dr. Janice Norrie. 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 17. Diarrhea after FOLFOX chemotherapy given on 07/26/2012-resolved. The bolus and infusional 5-FU were dose reduced by 20% beginning with FOLFOX on 08/17/2012.  18. History of an Irregular heart rate- Status post evaluation by Dr. Sarajane Jews on 11/02/2012 with findings of sinus rhythm with occasional PACs. 19. Diarrhea following FOLFIRI/Avastin. He reports the diarrhea is relieved with Lomotil.  20. "Sinus congestion". He is status post evaluation by ENT. Symptoms are better. 21. Hypokalemia 07/05/2013. He continues a potassium supplement.  [place] noted when here on 10/18/2013. He completed a 4 day course of Diflucan. 23. Intermittent bilateral shoulder pain. Question arthritis.   Dispositon-he appears stable. Plan to proceed with cycle 11 FOLFIRI/Avastin today as scheduled. We referred him for restaging CT scans in 2 weeks. He will return for a followup visit and chemotherapy in 3 weeks. He will contact the office in the interim with any problems.   Ned Card ANP/GNP-BC

## 2014-01-30 NOTE — Telephone Encounter (Signed)
gv and printed aptp sched and avs forpt for Feb and March....sed added tx.

## 2014-01-31 LAB — CEA: CEA: 14.5 ng/mL — ABNORMAL HIGH (ref 0.0–5.0)

## 2014-02-01 ENCOUNTER — Ambulatory Visit (HOSPITAL_BASED_OUTPATIENT_CLINIC_OR_DEPARTMENT_OTHER): Payer: Medicare Other

## 2014-02-01 VITALS — BP 153/88 | HR 98 | Temp 97.4°F

## 2014-02-01 DIAGNOSIS — C20 Malignant neoplasm of rectum: Secondary | ICD-10-CM

## 2014-02-01 DIAGNOSIS — Z452 Encounter for adjustment and management of vascular access device: Secondary | ICD-10-CM

## 2014-02-01 MED ORDER — HEPARIN SOD (PORK) LOCK FLUSH 100 UNIT/ML IV SOLN
500.0000 [IU] | Freq: Once | INTRAVENOUS | Status: AC | PRN
  Administered 2014-02-01: 500 [IU]
  Filled 2014-02-01: qty 5

## 2014-02-01 MED ORDER — SODIUM CHLORIDE 0.9 % IJ SOLN
10.0000 mL | INTRAMUSCULAR | Status: DC | PRN
  Administered 2014-02-01: 10 mL
  Filled 2014-02-01: qty 10

## 2014-02-13 ENCOUNTER — Ambulatory Visit (HOSPITAL_COMMUNITY)
Admission: RE | Admit: 2014-02-13 | Discharge: 2014-02-13 | Disposition: A | Discharge status: Home / Self Care | Payer: Medicare Other | Source: Ambulatory Visit | Attending: Nurse Practitioner | Admitting: Nurse Practitioner

## 2014-02-13 DIAGNOSIS — C20 Malignant neoplasm of rectum: Secondary | ICD-10-CM | POA: Insufficient documentation

## 2014-02-13 DIAGNOSIS — C787 Secondary malignant neoplasm of liver and intrahepatic bile duct: Secondary | ICD-10-CM | POA: Insufficient documentation

## 2014-02-13 DIAGNOSIS — C7800 Secondary malignant neoplasm of unspecified lung: Secondary | ICD-10-CM

## 2014-02-13 MED ORDER — IOHEXOL 300 MG/ML  SOLN
100.0000 mL | Freq: Once | INTRAMUSCULAR | Status: AC | PRN
  Administered 2014-02-13: 100 mL via INTRAVENOUS

## 2014-02-19 ENCOUNTER — Other Ambulatory Visit: Payer: Self-pay | Admitting: Oncology

## 2014-02-20 ENCOUNTER — Telehealth: Payer: Self-pay | Admitting: Oncology

## 2014-02-20 ENCOUNTER — Other Ambulatory Visit (HOSPITAL_BASED_OUTPATIENT_CLINIC_OR_DEPARTMENT_OTHER): Payer: Medicare Other

## 2014-02-20 ENCOUNTER — Ambulatory Visit (HOSPITAL_BASED_OUTPATIENT_CLINIC_OR_DEPARTMENT_OTHER): Payer: Medicare Other | Admitting: Oncology

## 2014-02-20 ENCOUNTER — Ambulatory Visit (HOSPITAL_BASED_OUTPATIENT_CLINIC_OR_DEPARTMENT_OTHER): Payer: Medicare Other

## 2014-02-20 VITALS — BP 158/82 | HR 97 | Temp 98.2°F | Resp 20 | Ht 70.0 in | Wt 178.2 lb

## 2014-02-20 DIAGNOSIS — E876 Hypokalemia: Secondary | ICD-10-CM

## 2014-02-20 DIAGNOSIS — N1330 Unspecified hydronephrosis: Secondary | ICD-10-CM

## 2014-02-20 DIAGNOSIS — C7800 Secondary malignant neoplasm of unspecified lung: Secondary | ICD-10-CM

## 2014-02-20 DIAGNOSIS — Z5111 Encounter for antineoplastic chemotherapy: Secondary | ICD-10-CM

## 2014-02-20 DIAGNOSIS — I10 Essential (primary) hypertension: Secondary | ICD-10-CM

## 2014-02-20 DIAGNOSIS — C20 Malignant neoplasm of rectum: Secondary | ICD-10-CM

## 2014-02-20 DIAGNOSIS — Z5112 Encounter for antineoplastic immunotherapy: Secondary | ICD-10-CM

## 2014-02-20 DIAGNOSIS — F17200 Nicotine dependence, unspecified, uncomplicated: Secondary | ICD-10-CM

## 2014-02-20 DIAGNOSIS — C787 Secondary malignant neoplasm of liver and intrahepatic bile duct: Secondary | ICD-10-CM

## 2014-02-20 DIAGNOSIS — N201 Calculus of ureter: Secondary | ICD-10-CM

## 2014-02-20 DIAGNOSIS — M25519 Pain in unspecified shoulder: Secondary | ICD-10-CM

## 2014-02-20 LAB — CBC WITH DIFFERENTIAL/PLATELET
BASO%: 0.6 % (ref 0.0–2.0)
Basophils Absolute: 0 10*3/uL (ref 0.0–0.1)
EOS ABS: 0.1 10*3/uL (ref 0.0–0.5)
EOS%: 1.6 % (ref 0.0–7.0)
HCT: 36.9 % — ABNORMAL LOW (ref 38.4–49.9)
HGB: 12.6 g/dL — ABNORMAL LOW (ref 13.0–17.1)
LYMPH%: 23 % (ref 14.0–49.0)
MCH: 34.7 pg — ABNORMAL HIGH (ref 27.2–33.4)
MCHC: 34 g/dL (ref 32.0–36.0)
MCV: 101.9 fL — ABNORMAL HIGH (ref 79.3–98.0)
MONO#: 0.5 10*3/uL (ref 0.1–0.9)
MONO%: 11.8 % (ref 0.0–14.0)
NEUT%: 63 % (ref 39.0–75.0)
NEUTROS ABS: 2.8 10*3/uL (ref 1.5–6.5)
PLATELETS: 184 10*3/uL (ref 140–400)
RBC: 3.62 10*6/uL — AB (ref 4.20–5.82)
RDW: 15.3 % — ABNORMAL HIGH (ref 11.0–14.6)
WBC: 4.5 10*3/uL (ref 4.0–10.3)
lymph#: 1 10*3/uL (ref 0.9–3.3)

## 2014-02-20 LAB — COMPREHENSIVE METABOLIC PANEL (CC13)
ALT: 16 U/L (ref 0–55)
AST: 19 U/L (ref 5–34)
Albumin: 3.5 g/dL (ref 3.5–5.0)
Alkaline Phosphatase: 136 U/L (ref 40–150)
Anion Gap: 11 mEq/L (ref 3–11)
BUN: 24.3 mg/dL (ref 7.0–26.0)
CO2: 21 mEq/L — ABNORMAL LOW (ref 22–29)
CREATININE: 1.6 mg/dL — AB (ref 0.7–1.3)
Calcium: 9.2 mg/dL (ref 8.4–10.4)
Chloride: 111 mEq/L — ABNORMAL HIGH (ref 98–109)
GLUCOSE: 126 mg/dL (ref 70–140)
Potassium: 3.8 mEq/L (ref 3.5–5.1)
SODIUM: 142 meq/L (ref 136–145)
TOTAL PROTEIN: 7 g/dL (ref 6.4–8.3)
Total Bilirubin: 0.6 mg/dL (ref 0.20–1.20)

## 2014-02-20 MED ORDER — IRINOTECAN HCL CHEMO INJECTION 100 MG/5ML
144.0000 mg/m2 | Freq: Once | INTRAVENOUS | Status: DC
  Administered 2014-02-20: 290 mg via INTRAVENOUS
  Filled 2014-02-20: qty 14.5

## 2014-02-20 MED ORDER — PALONOSETRON HCL INJECTION 0.25 MG/5ML
INTRAVENOUS | Status: AC
  Filled 2014-02-20: qty 5

## 2014-02-20 MED ORDER — PALONOSETRON HCL INJECTION 0.25 MG/5ML
0.2500 mg | Freq: Once | INTRAVENOUS | Status: AC
  Administered 2014-02-20: 0.25 mg via INTRAVENOUS

## 2014-02-20 MED ORDER — ATROPINE SULFATE 1 MG/ML IJ SOLN
INTRAMUSCULAR | Status: AC
  Filled 2014-02-20: qty 1

## 2014-02-20 MED ORDER — SODIUM CHLORIDE 0.9 % IV SOLN
Freq: Once | INTRAVENOUS | Status: AC
  Administered 2014-02-20: 13:00:00 via INTRAVENOUS

## 2014-02-20 MED ORDER — LEUCOVORIN CALCIUM INJECTION 350 MG
317.0000 mg/m2 | Freq: Once | INTRAVENOUS | Status: DC
  Administered 2014-02-20: 640 mg via INTRAVENOUS
  Filled 2014-02-20: qty 32

## 2014-02-20 MED ORDER — FLUOROURACIL CHEMO INJECTION 2.5 GM/50ML
320.0000 mg/m2 | Freq: Once | INTRAVENOUS | Status: AC
  Administered 2014-02-20: 650 mg via INTRAVENOUS
  Filled 2014-02-20: qty 13

## 2014-02-20 MED ORDER — SODIUM CHLORIDE 0.9 % IV SOLN
1920.0000 mg/m2 | INTRAVENOUS | Status: AC
  Administered 2014-02-20: 3900 mg via INTRAVENOUS
  Filled 2014-02-20: qty 78

## 2014-02-20 MED ORDER — SODIUM CHLORIDE 0.9 % IV SOLN
5.0000 mg/kg | Freq: Once | INTRAVENOUS | Status: AC
  Administered 2014-02-20: 425 mg via INTRAVENOUS
  Filled 2014-02-20: qty 17

## 2014-02-20 MED ORDER — DEXAMETHASONE SODIUM PHOSPHATE 20 MG/5ML IJ SOLN
20.0000 mg | Freq: Once | INTRAMUSCULAR | Status: AC
  Administered 2014-02-20: 20 mg via INTRAVENOUS

## 2014-02-20 MED ORDER — DEXAMETHASONE SODIUM PHOSPHATE 20 MG/5ML IJ SOLN
INTRAMUSCULAR | Status: AC
  Filled 2014-02-20: qty 5

## 2014-02-20 MED ORDER — ATROPINE SULFATE 1 MG/ML IJ SOLN
0.5000 mg | Freq: Once | INTRAMUSCULAR | Status: AC | PRN
  Administered 2014-02-20: 0.5 mg via INTRAVENOUS

## 2014-02-20 NOTE — Telephone Encounter (Signed)
gv adn printed aptp sched adn avs for pt for March and April.....[name] [name] added tx.

## 2014-02-20 NOTE — Progress Notes (Signed)
OFFICE PROGRESS NOTE   INTERVAL HISTORY:   He completed another cycle of FOLFIRI/Avastin on 01/30/2014. He tolerated chemotherapy well. No diarrhea. No new complaint. He continues to have shoulder pain.  Objective:  Vital signs in last 24 hours:  Blood pressure 158/82, pulse 97, temperature 98.2 F (36.8 C), temperature source Oral, resp. rate 20, height $RemoveBe'5\' 10"'mAHCSKWQa$  (1.778 m), weight 178 lb 3.2 oz (80.831 kg), SpO2 99.00%.    HEENT: No thrush or ulcers Lymphatics: No cervical, supraclavicular, axillary, or inguinal nodes Resp: Lungs clear bilaterally Cardio: Regular rate and rhythm GI: No hepatomegaly, nontender, no mass Vascular: No leg edema  Portacath/PICC-without erythema  Lab Results:  Lab Results  Component Value Date   WBC 4.5 02/20/2014   HGB 12.6* 02/20/2014   HCT 36.9* 02/20/2014   MCV 101.9* 02/20/2014   PLT 184 02/20/2014   NEUTROABS 2.8 02/20/2014   CEA on 01/30/2014-14.5  Radiology: CTs of the chest, abdomen, and pelvis on 02/13/2014, compared to 09/23/2013: No significant change in lung nodules and liver metastases. Chronic right hydroureteronephrosis  Medications: I have reviewed the patient's current medications.  Assessment/Plan: 1. Metastatic rectal cancer status post biopsy of rectal mass 09/03/2011 with pathology showing invasive adenocarcinoma. Staging CT scans 09/03/2011 showed a 10 mm hypodense rounded lesion in left lateral hepatic lobe, similar 6 mm lesion in the superior right hepatic lobe and a smaller subcapsular lesion in the lateral right hepatic lobe; rectal mass within the lumen of the bowel emanating from the left wall measuring 4.9 x 3.5 cm and a mass within the low left perirectal fat measuring 2.3 x 3.2 cm consistent with local extension of carcinoma. Staging PET scan 09/18/2011 showed a 5.1 cm distal sigmoid/rectal mass with max SUV 19.2 with local extension into the left perirectal fat with max SUV 7.7. At least 2 suspected hepatic metastases with  max SUV 6.7 in the lateral segment left hepatic lobe and max SUV 4.1 in the lateral right hepatic dome. Additional tiny hypodensities in the liver may be cysts; patchy/nodular opacity in the left lower lobe with max SUV 3.5 worrisome for pulmonary metastasis, less likely infectious. He began radiation and concurrent Xeloda chemotherapy on 09/29/2011. He completed radiation on 11/06/2011. MRI of the liver on 12/08/2011 showed 2 hypermetabolic liver lesions, similar in size to 09/03/2011. 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 Equivocal 7 mm lesion in the inferior right hepatic lobe. No evidence of extrahepatic metastasis within the abdomen. An ultrasound-guided biopsy of a small left hepatic lesion on 12/23/2011 was nondiagnostic. He underwent a low anterior resection with loop ileostomy on 02/06/2012 with final pathology showing a 3.5 cm invasive adenocarcinoma with abundant extracellular mucin invading through the muscularis propria into pericolonic fatty tissue; 4 of 10 pericolonic lymph nodes were positive for metastatic carcinoma; resection margins were clear (ypT3,ypN2). He began FOLFOX chemotherapy on 03/16/2012. He completed 2 cycles of FOLFOX chemotherapy and underwent an ileostomy reversal 05/05/2012. FOLFOX was resumed on 06/14/2012 and completed on 09/14/2012. 2. CEA 13.8 on 09/17/2011; 1.6 on 12/01/2011; 8.4 on 03/09/2012, 2.1 on 05/03/2012; 2.5 on 07/26/2012; 1.8 on 10/12/2012. 3.0 on 11/23/2012. CEA elevated at 25.4 on 02/22/2013, 44.7 on 04/04/2013, and 59.1 on 05/17/2013. CEA improved at 16.8 on 08/10/2013 and 10.8 on 09/23/2013. CEA improved at 8.9 on 10/18/2013. 3. CT chest/abdomen/pelvis 03/10/2013 with 2 new left lower lobe nodules and 2 liver lesions increased in size. Progressive metastatic disease in the lungs and liver on a restaging CT 06/07/2013.  He began treatment with FOLFIRI/Avastin on 06/21/2013. Restaging CT 09/23/2013  after 5 cycles of FOLFIRI/Avastin, with stable lung lesions and a decrease in the size of dominant liver lesions.  Cycle 6 FOLFIRI/Avastin 09/27/2013.  Cycle 7 FOLFIRI/Avastin 10/18/2013.  Cycle 8 FOLFIRI/Avastin 11/09/2013.  Cycle 9 FOLFIRI/Avastin 12/13/2013.  Cycle 10 FOLFIRI/Avastin 01/03/2014. Cycle 11 FOLFIRI/Avastin 01/30/2014 Restaging CT 02/13/2014-no significant change in multiple lung/liver lesions 4. K-ras mutation detected. 5. Rectal pain and bleeding secondary to #1. Resolved. 6. Frequent loose stools secondary to #1. Improved. 7. Hypertension. 8. Tobacco use. 9. CT scan 09/03/2011 with chronic right hydronephrosis secondary to an obstructing calculus in the mid right ureter. He is followed by Dr. Janice Norrie. Stable on the CT 06/07/2013 10. Status post cystoscopy, right retrograde pyelogram, ureteroscopy and insertion of right double-J stent 11/28/2011. The double-J stent was replaced 02/06/2012. The stent has been removed. 11. Elevated BUN/creatinine. Likely secondary to dehydration while the ileostomy was in place. He now appears to have chronic renal insufficiency. 12. Status post ultrasound-guided fine-needle aspiration of small left hepatic lesion 12/22/2011. The pathology revealed no malignancy. 13. Malaise and weight loss. Resolved. 14. High output ileostomy. Partially improved with Imodium. Status post ileostomy reversal 05/05/2012. 15. Erectile dysfunction. He is followed by Dr. Janice Norrie. 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 17. Diarrhea after FOLFOX chemotherapy given on 07/26/2012-resolved. The bolus and infusional 5-FU were dose reduced by 20% beginning with FOLFOX on 08/17/2012.  18. History of an Irregular heart rate- Status post evaluation by Dr. Sarajane Jews on 11/02/2012 with findings of sinus rhythm with occasional PACs. 19. Diarrhea following FOLFIRI/Avastin. He reports the diarrhea is relieved with Lomotil.  20. "Sinus congestion". He is status  post evaluation by ENT. Symptoms are better. 21. Hypokalemia 07/05/2013. He continues a potassium supplement.  22. Intermittent bilateral shoulder pain. Question arthritis.   Disposition:  [name] [name] appears stable. The restaging CTs show no clear evidence of disease progression. The plan is to continue FOLFIRI/Avastin on a 3 week schedule. We will followup on the CEA from today. [name] [name] will return for an office visit in 3 weeks.   Betsy Coder, MD 02/20/2014, 1:40 PM

## 2014-02-22 ENCOUNTER — Ambulatory Visit (HOSPITAL_BASED_OUTPATIENT_CLINIC_OR_DEPARTMENT_OTHER): Payer: Medicare Other

## 2014-02-22 VITALS — BP 148/81 | HR 94 | Temp 98.0°F

## 2014-02-22 DIAGNOSIS — C20 Malignant neoplasm of rectum: Secondary | ICD-10-CM

## 2014-02-22 MED ORDER — SODIUM CHLORIDE 0.9 % IJ SOLN
10.0000 mL | INTRAMUSCULAR | Status: DC | PRN
  Administered 2014-02-22: 10 mL
  Filled 2014-02-22: qty 10

## 2014-02-22 MED ORDER — HEPARIN SOD (PORK) LOCK FLUSH 100 UNIT/ML IV SOLN
500.0000 [IU] | Freq: Once | INTRAVENOUS | Status: AC | PRN
  Administered 2014-02-22: 500 [IU]
  Filled 2014-02-22: qty 5

## 2014-03-04 ENCOUNTER — Ambulatory Visit: Payer: Self-pay | Admitting: Emergency Medicine

## 2014-03-04 VITALS — BP 138/80 | HR 99 | Temp 98.0°F | Resp 18 | Ht 69.0 in | Wt 174.0 lb

## 2014-03-04 DIAGNOSIS — Z0000 Encounter for general adult medical examination without abnormal findings: Secondary | ICD-10-CM

## 2014-03-04 DIAGNOSIS — Z0289 Encounter for other administrative examinations: Secondary | ICD-10-CM

## 2014-03-04 NOTE — Progress Notes (Addendum)
This chart was scribed for Remo Lipps A. Everlene Farrier, MD  by Stacy Gardner, [facility] Scribe. The patient was seen in room and the patient's care was started at 10:30 AM.   HPI HPI Comments: [name] [name] is a 63 y.o. male who arrives to the [facility] complaining of DOT exam. Pt is receiving treatment at the [facility] for rectal cancer. He goes to chemotherapy every three weeks. Pt has a hx COPD. Pt currently smokes tobacco and he admits that he is having difficulty quiting the habit. Pt drives a local dump truck. Pt was dx two 2012 and had surgery 02/2012. He has a picc line to his left chest.   Patient Active Problem List   Diagnosis Date Noted  . Chronic renal insufficiency, stage III (moderate) 04/18/2013  . Lung metastases 04/18/2013  . HTN (hypertension) 04/18/2013  . History of anemia 05/03/2012  . Hydronephrosis of right kidney 10/06/2011  . Rectal cancer, approx. 11 cm from anal verge, s/p LAR and loop ileostomy 02/06/12 09/08/2011  . Liver masses, seen on CT scan 09/08/2011  . CHRONIC FRONTAL SINUSITIS 06/27/2010  . HYPERSOMNIA, ASSOCIATED WITH SLEEP APNEA 06/27/2010  . INTERNAL HEMORRHOIDS WITH OTHER COMPLICATION 71/05/2693  . CIGARETTE SMOKER 01/07/2010  . HYPERTENSION 07/09/2007   Past Medical History  Diagnosis Date  . Hypertension   . Abnormal EKG 11/01/11    REPORT IN EPIC-PT STATES NO KNOWN HEART PROBLEMS-NO CHEST PAINS  . 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 Low blood potassium     POTASSIUM WAS 2.1 ON 11/01/11 VISIT TO ER--PT PUT ON ORAL POTASSIUM--MOST RECENT LAB 11/24/11 POTASSIUM IMPROVED TO 3.1  . History of radiation therapy 09/29/11-11/07/11    rectal ca  . Nasal congestion   . COPD (chronic obstructive pulmonary disease)   . Heart murmur     SINCE BIRTH--DOES NOT CAUSE ANY PROBLEMS  . Dyspnea     no recent bronchitis  . Recurrent upper respiratory infection (URI)     HEAD COLD   . Sleep apnea    stop-bang score =6 (NO SLEEP STUDY DONE YET)  . No pertinent past medical history     2 SPOTS ON LIVER JUST WATCHING  . Hydronephrosis of right kidney     "Chronic" -SECONDARY TO CALCULUS (STENT RT URETER) MID RT URETER AND ADDITIONAL CALCULI WITHIN RT KIDNEY--PT STATES HE HAS PASSED A STONE IN THE PAST--DID NOT KNOW HE HAD MORE STONES UNTIL CT ABD DONE ON 09/03/11  . Rectal cancer     HAS COMPLETED RADIATION AND XELODA--PLAN RESTAGING TESTING.  DR. Benay Spice AND DR. MANNING AT [place] [facility]  . Cancer     rectal adenocarcinoma stage [id number]  . Hydroureter, right   . Nephrolithiasis    Past Surgical History  Procedure Laterality Date  . Wrist and hand surgery  yrs ago    LEFT WRIST AND RIGHT HAND--GANGLION LW  AND FX OF RT HAS  . Right hand surgery -repair fracture  yrs ago  . Removal ganglion cyst  yrs ago  . Cystoscopy with stent placment  jan 2013  . Appendectomy  02/06/2012    Procedure: APPENDECTOMY;  Surgeon: Shann Medal, MD;  Location: [facility];  Service: General;;  . Laparotomy  02/06/2012    Procedure: EXPLORATORY LAPAROTOMY;  Surgeon: Shann Medal, MD;  Location: [facility];  Service: General;;  . Ureterolithotomy  02/06/2012    Procedure: URETEROLITHOTOMY;  Surgeon: Kirtland Bouchard  Nesi, MD;  Location: [facility];  Service: Urology;  Laterality: Right;  open exploration of right ureter, double J stent exchange, attempted ureteroscopy  . Colostomy    . Portacath placement  03/10/2012    Procedure: INSERTION PORT-A-CATH;  Surgeon: Shann Medal, MD;  Location: [facility];  Service: General;  Laterality: N/A;  . Colon surgery    . Ileo loop colostomy closure  05/05/2012    Procedure: LAPAROSCOPIC ILEO LOOP COLOSTOMY CLOSURE;  Surgeon: Shann Medal, MD;  Location: [facility];  Service: General;  Laterality: N/A;  Laparoscopic Assisted Ileostomy reversal  . Metastatic recatal cnacer      liver and lung   No Known Allergies Prior to Admission medications   Medication Sig Start Date End Date  Taking? Authorizing Provider  amLODipine (NORVASC) 10 MG tablet Take 1 tablet (10 mg total) by mouth daily. 03/28/13  Yes Ricard Dillon, MD  fluticasone Asencion Islam) 50 MCG/ACT nasal spray  01/09/14  Yes Historical Provider, MD  naproxen sodium (ANAPROX) 220 MG tablet Take 220 mg by mouth as needed.   Yes Historical Provider, MD  pantoprazole (PROTONIX) 40 MG tablet Take 1 tablet (40 mg total) by mouth 2 (two) times daily. 12/05/13  Yes Ladell Pier, MD  potassium chloride SA (KLOR-CON M20) 20 MEQ tablet Take 1 tablet (20 mEq total) by mouth daily. 07/22/13  Yes Ricard Dillon, MD  prochlorperazine (COMPAZINE) 10 MG tablet Take 10 mg by mouth every 6 (six) hours as needed for nausea or vomiting.   Yes Ladell Pier, MD  terazosin (HYTRIN) 5 MG capsule Take 1 capsule (5 mg total) by mouth at bedtime. 04/18/13  Yes Ricard Dillon, MD  traMADol (ULTRAM) 50 MG tablet Take 1 tablet (50 mg total) by mouth every 12 (twelve) hours as needed. 11/09/13  Yes Owens Shark, NP  diphenoxylate-atropine (LOMOTIL) 2.5-0.025 MG per tablet Take 1 tablet by mouth 4 (four) times daily as needed for diarrhea or loose stools. 09/27/13   Ladell Pier, MD  mometasone (NASONEX) 50 MCG/ACT nasal spray Place 2 sprays into the nose daily. 08/31/13   Owens Shark, NP   History   Social History  . Marital Status: Married    Spouse Name: N/A    Number of Children: N/A  . Years of Education: N/A   Occupational History  . Not on file.   Social History Main Topics  . Smoking status: Current Every Day Smoker -- 1.00 packs/day for 46 years    Types: Cigarettes  . Smokeless tobacco: Never Used  . Alcohol Use: No  . Drug Use: No  . Sexual Activity: Yes   Other Topics Concern  . Not on file   Social History Narrative  . No narrative on file  ROS  Physical Exam HEENT exam is unremarkable. His neck was supple. Chest clear heart regular rate no murmurs abdomen shows surgical scars but no tenderness. Skin exam reveals a  PICC line present left anterior chest. His extremities are without cyanosis clubbing or edema  PLAN  Patient does qualify for a 1 year DOT card. He does have proteinuria but this should not affect his ability to drive. According to the records he does have metastatic rectal cancer with metastasis to his lungs and liver. Patient looks remarkably good. I advised him that if he developed anemia and was feeling fatigued he is he should not drive at that time. His last CBC was good .

## 2014-03-12 ENCOUNTER — Other Ambulatory Visit: Payer: Self-pay | Admitting: Oncology

## 2014-03-13 ENCOUNTER — Ambulatory Visit (HOSPITAL_BASED_OUTPATIENT_CLINIC_OR_DEPARTMENT_OTHER): Payer: Medicare Other

## 2014-03-13 ENCOUNTER — Other Ambulatory Visit: Payer: Self-pay

## 2014-03-13 ENCOUNTER — Telehealth: Payer: Self-pay | Admitting: Oncology

## 2014-03-13 ENCOUNTER — Ambulatory Visit (HOSPITAL_BASED_OUTPATIENT_CLINIC_OR_DEPARTMENT_OTHER): Payer: Medicare Other | Admitting: Oncology

## 2014-03-13 ENCOUNTER — Other Ambulatory Visit (HOSPITAL_BASED_OUTPATIENT_CLINIC_OR_DEPARTMENT_OTHER): Payer: Medicare Other

## 2014-03-13 VITALS — BP 142/84 | HR 100 | Temp 98.4°F | Resp 18 | Ht 69.0 in | Wt 176.1 lb

## 2014-03-13 DIAGNOSIS — C20 Malignant neoplasm of rectum: Secondary | ICD-10-CM

## 2014-03-13 DIAGNOSIS — C772 Secondary and unspecified malignant neoplasm of intra-abdominal lymph nodes: Secondary | ICD-10-CM

## 2014-03-13 DIAGNOSIS — C7800 Secondary malignant neoplasm of unspecified lung: Secondary | ICD-10-CM

## 2014-03-13 DIAGNOSIS — E876 Hypokalemia: Secondary | ICD-10-CM

## 2014-03-13 DIAGNOSIS — C787 Secondary malignant neoplasm of liver and intrahepatic bile duct: Secondary | ICD-10-CM

## 2014-03-13 DIAGNOSIS — M25519 Pain in unspecified shoulder: Secondary | ICD-10-CM

## 2014-03-13 DIAGNOSIS — Z5111 Encounter for antineoplastic chemotherapy: Secondary | ICD-10-CM

## 2014-03-13 DIAGNOSIS — G620 Drug-induced polyneuropathy: Secondary | ICD-10-CM

## 2014-03-13 DIAGNOSIS — Z5112 Encounter for antineoplastic immunotherapy: Secondary | ICD-10-CM

## 2014-03-13 LAB — CBC WITH DIFFERENTIAL/PLATELET
BASO%: 0.7 % (ref 0.0–2.0)
BASOS ABS: 0 10*3/uL (ref 0.0–0.1)
EOS ABS: 0.1 10*3/uL (ref 0.0–0.5)
EOS%: 2.4 % (ref 0.0–7.0)
HCT: 37.9 % — ABNORMAL LOW (ref 38.4–49.9)
HEMOGLOBIN: 13 g/dL (ref 13.0–17.1)
LYMPH%: 15.9 % (ref 14.0–49.0)
MCH: 35.3 pg — ABNORMAL HIGH (ref 27.2–33.4)
MCHC: 34.3 g/dL (ref 32.0–36.0)
MCV: 102.7 fL — ABNORMAL HIGH (ref 79.3–98.0)
MONO#: 0.5 10*3/uL (ref 0.1–0.9)
MONO%: 11.5 % (ref 0.0–14.0)
NEUT%: 69.5 % (ref 39.0–75.0)
NEUTROS ABS: 2.8 10*3/uL (ref 1.5–6.5)
PLATELETS: 216 10*3/uL (ref 140–400)
RBC: 3.69 10*6/uL — ABNORMAL LOW (ref 4.20–5.82)
RDW: 15.1 % — ABNORMAL HIGH (ref 11.0–14.6)
WBC: 4 10*3/uL (ref 4.0–10.3)
lymph#: 0.6 10*3/uL — ABNORMAL LOW (ref 0.9–3.3)

## 2014-03-13 LAB — CEA: CEA: 16.9 ng/mL — ABNORMAL HIGH (ref 0.0–5.0)

## 2014-03-13 LAB — COMPREHENSIVE METABOLIC PANEL (CC13)
ALT: 13 U/L (ref 0–55)
ANION GAP: 13 meq/L — AB (ref 3–11)
AST: 18 U/L (ref 5–34)
Albumin: 3.6 g/dL (ref 3.5–5.0)
Alkaline Phosphatase: 124 U/L (ref 40–150)
BUN: 27.9 mg/dL — ABNORMAL HIGH (ref 7.0–26.0)
CALCIUM: 9.2 mg/dL (ref 8.4–10.4)
CHLORIDE: 110 meq/L — AB (ref 98–109)
CO2: 22 meq/L (ref 22–29)
CREATININE: 1.6 mg/dL — AB (ref 0.7–1.3)
GLUCOSE: 126 mg/dL (ref 70–140)
Potassium: 3.6 mEq/L (ref 3.5–5.1)
Sodium: 145 mEq/L (ref 136–145)
Total Bilirubin: 0.56 mg/dL (ref 0.20–1.20)
Total Protein: 6.9 g/dL (ref 6.4–8.3)

## 2014-03-13 LAB — UA PROTEIN, DIPSTICK - CHCC: PROTEIN: 30 mg/dL

## 2014-03-13 MED ORDER — ATROPINE SULFATE 1 MG/ML IJ SOLN
INTRAMUSCULAR | Status: AC
  Filled 2014-03-13: qty 1

## 2014-03-13 MED ORDER — DEXAMETHASONE SODIUM PHOSPHATE 20 MG/5ML IJ SOLN
INTRAMUSCULAR | Status: AC
  Filled 2014-03-13: qty 5

## 2014-03-13 MED ORDER — PALONOSETRON HCL INJECTION 0.25 MG/5ML
INTRAVENOUS | Status: AC
  Filled 2014-03-13: qty 5

## 2014-03-13 MED ORDER — SODIUM CHLORIDE 0.9 % IJ SOLN
10.0000 mL | INTRAMUSCULAR | Status: DC | PRN
  Filled 2014-03-13: qty 10

## 2014-03-13 MED ORDER — SODIUM CHLORIDE 0.9 % IV SOLN
1920.0000 mg/m2 | INTRAVENOUS | Status: DC
  Administered 2014-03-13: 3900 mg via INTRAVENOUS
  Filled 2014-03-13: qty 78

## 2014-03-13 MED ORDER — LEUCOVORIN CALCIUM INJECTION 350 MG
317.0000 mg/m2 | Freq: Once | INTRAMUSCULAR | Status: AC
  Administered 2014-03-13: 640 mg via INTRAVENOUS
  Filled 2014-03-13: qty 32

## 2014-03-13 MED ORDER — SODIUM CHLORIDE 0.9 % IV SOLN: Freq: Once | INTRAVENOUS | Status: DC

## 2014-03-13 MED ORDER — ATROPINE SULFATE 1 MG/ML IJ SOLN
0.5000 mg | Freq: Once | INTRAMUSCULAR | Status: AC | PRN
  Administered 2014-03-13: 0.5 mg via INTRAVENOUS

## 2014-03-13 MED ORDER — FLUOROURACIL CHEMO INJECTION 2.5 GM/50ML
320.0000 mg/m2 | Freq: Once | INTRAVENOUS | Status: AC
  Administered 2014-03-13: 650 mg via INTRAVENOUS
  Filled 2014-03-13: qty 13

## 2014-03-13 MED ORDER — PALONOSETRON HCL INJECTION 0.25 MG/5ML
0.2500 mg | Freq: Once | INTRAVENOUS | Status: AC
  Administered 2014-03-13: 0.25 mg via INTRAVENOUS

## 2014-03-13 MED ORDER — DEXAMETHASONE SODIUM PHOSPHATE 20 MG/5ML IJ SOLN
20.0000 mg | Freq: Once | INTRAMUSCULAR | Status: AC
  Administered 2014-03-13: 20 mg via INTRAVENOUS

## 2014-03-13 MED ORDER — SODIUM CHLORIDE 0.9 % IV SOLN
Freq: Once | INTRAVENOUS | Status: AC
  Administered 2014-03-13: 09:00:00 via INTRAVENOUS

## 2014-03-13 MED ORDER — IRINOTECAN HCL CHEMO INJECTION 100 MG/5ML
144.0000 mg/m2 | Freq: Once | INTRAVENOUS | Status: AC
  Administered 2014-03-13: 290 mg via INTRAVENOUS
  Filled 2014-03-13: qty 14.5

## 2014-03-13 MED ORDER — SODIUM CHLORIDE 0.9 % IV SOLN
5.0000 mg/kg | Freq: Once | INTRAVENOUS | Status: AC
  Administered 2014-03-13: 425 mg via INTRAVENOUS
  Filled 2014-03-13: qty 17

## 2014-03-13 NOTE — Patient Instructions (Signed)
[facility] Discharge Instructions for Patients Receiving Chemotherapy  Today you received the following chemotherapy agents: avastin, irinotecan, leucovorin, 8fu.  To help prevent nausea and vomiting after your treatment, we encourage you to take your nausea medication.  Take it as often as prescribed.     If you develop nausea and vomiting that is not controlled by your nausea medication, call the clinic. If it is after clinic hours your family physician or the after hours number for the clinic or go to the Emergency Department.   BELOW ARE SYMPTOMS THAT SHOULD BE REPORTED IMMEDIATELY:  *FEVER GREATER THAN 100.5 F  *CHILLS WITH OR WITHOUT FEVER  NAUSEA AND VOMITING THAT IS NOT CONTROLLED WITH YOUR NAUSEA MEDICATION  *UNUSUAL SHORTNESS OF BREATH  *UNUSUAL BRUISING OR BLEEDING  TENDERNESS IN MOUTH AND THROAT WITH OR WITHOUT PRESENCE OF ULCERS  *URINARY PROBLEMS  *BOWEL PROBLEMS  UNUSUAL RASH Items with * indicate a potential emergency and should be followed up as soon as possible.  Feel free to call the clinic you have any questions or concerns. The clinic phone number is [phone].   I have been informed and understand all the instructions given to me. I know to contact the clinic, my physician, or go to the Emergency Department if any problems should occur. I do not have any questions at this time, but understand that I may call the clinic during office hours   should I have any questions or need assistance in obtaining follow up care.    __________________________________________  _____________  __________ Signature of Patient or Authorized Representative            Date                   Time    __________________________________________ Nurse's Signature

## 2014-03-13 NOTE — Progress Notes (Signed)
[facility] OFFICE PROGRESS NOTE   Diagnosis: Metastatic rectal cancer  INTERVAL HISTORY:   [name] [name] returns as scheduled. He completed another cycle of FOLFIRI/Avastin 02/20/2014. He reports 1 episode of nausea and vomiting following chemotherapy. He has a "cold ". He has been evaluated by ENT for recurrent sinus symptoms. He is now taking Flonase. No fever or dyspnea.  Objective:  Vital signs in last 24 hours:  There were no vitals taken for this visit.    HEENT: No thrush or ulcers Resp: Lungs clear bilaterally Cardio: Regular rate and rhythm GI: No hepatomegaly Vascular: No leg edema   Portacath/PICC-without erythema  Lab Results:  Lab Results  Component Value Date   WBC 4.0 03/13/2014   HGB 13.0 03/13/2014   HCT 37.9* 03/13/2014   MCV 102.7* 03/13/2014   PLT 216 03/13/2014   NEUTROABS 2.8 03/13/2014    Lab Results  Component Value Date   CEA 14.5* 01/30/2014    Imaging:  No results found.  Medications: I have reviewed the patient's current medications.  Assessment/Plan: 1. Metastatic rectal cancer status post biopsy of rectal mass 09/03/2011 with pathology showing invasive adenocarcinoma. Staging CT scans 09/03/2011 showed a 10 mm hypodense rounded lesion in left lateral hepatic lobe, similar 6 mm lesion in the superior right hepatic lobe and a smaller subcapsular lesion in the lateral right hepatic lobe; rectal mass within the lumen of the bowel emanating from the left wall measuring 4.9 x 3.5 cm and a mass within the low left perirectal fat measuring 2.3 x 3.2 cm consistent with local extension of carcinoma. Staging PET scan 09/18/2011 showed a 5.1 cm distal sigmoid/rectal mass with max SUV 19.2 with local extension into the left perirectal fat with max SUV 7.7. At least 2 suspected hepatic metastases with max SUV 6.7 in the lateral segment left hepatic lobe and max SUV 4.1 in the lateral right hepatic dome. Additional tiny hypodensities in the liver may  be cysts; patchy/nodular opacity in the left lower lobe with max SUV 3.5 worrisome for pulmonary metastasis, less likely infectious. He began radiation and concurrent Xeloda chemotherapy on 09/29/2011. He completed radiation on 11/06/2011. MRI of the liver on 12/08/2011 showed 2 hypermetabolic liver lesions, similar in size to 09/03/2011. 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 Equivocal 7 mm lesion in the inferior right hepatic lobe. No evidence of extrahepatic metastasis within the abdomen. An ultrasound-guided biopsy of a small left hepatic lesion on 12/23/2011 was nondiagnostic. He underwent a low anterior resection with loop ileostomy on 02/06/2012 with final pathology showing a 3.5 cm invasive adenocarcinoma with abundant extracellular mucin invading through the muscularis propria into pericolonic fatty tissue; 4 of 10 pericolonic lymph nodes were positive for metastatic carcinoma; resection margins were clear (ypT3,ypN2). He began FOLFOX chemotherapy on 03/16/2012. He completed 2 cycles of FOLFOX chemotherapy and underwent an ileostomy reversal 05/05/2012. FOLFOX was resumed on 06/14/2012 and completed on 09/14/2012. 2. CEA 13.8 on 09/17/2011; 1.6 on 12/01/2011; 8.4 on 03/09/2012, 2.1 on 05/03/2012; 2.5 on 07/26/2012; 1.8 on 10/12/2012. 3.0 on 11/23/2012. CEA elevated at 25.4 on 02/22/2013, 44.7 on 04/04/2013, and 59.1 on 05/17/2013. CEA improved at 16.8 on 08/10/2013 and 10.8 on 09/23/2013. CEA improved at 8.9 on 10/18/2013. 3. CT chest/abdomen/pelvis 03/10/2013 with 2 new left lower lobe nodules and 2 liver lesions increased in size. Progressive metastatic disease in the lungs and liver on a restaging CT 06/07/2013. He began treatment with FOLFIRI/Avastin on 06/21/2013. Restaging CT  09/23/2013 after 5 cycles of FOLFIRI/Avastin, with stable lung lesions and a decrease in the size of dominant liver lesions.  Cycle 6 FOLFIRI/Avastin 09/27/2013.   Cycle 7 FOLFIRI/Avastin 10/18/2013.  Cycle 8 FOLFIRI/Avastin 11/09/2013.  Cycle 9 FOLFIRI/Avastin 12/13/2013.  Cycle 10 FOLFIRI/Avastin 01/03/2014.  Cycle 11 FOLFIRI/Avastin 01/30/2014  Restaging CT 02/13/2014-no significant change in multiple lung/liver lesions 4. K-ras mutation detected. 5. Rectal pain and bleeding secondary to #1. Resolved. 6. Frequent loose stools secondary to #1. Improved. 7. Hypertension. 8. Tobacco use. 9. CT scan 09/03/2011 with chronic right hydronephrosis secondary to an obstructing calculus in the mid right ureter. He is followed by Dr. Janice Norrie. Stable on the CT 06/07/2013 10. Status post cystoscopy, right retrograde pyelogram, ureteroscopy and insertion of right double-J stent 11/28/2011. The double-J stent was replaced 02/06/2012. The stent has been removed. 11. Elevated BUN/creatinine. Likely secondary to dehydration while the ileostomy was in place. He now appears to have chronic renal insufficiency. 12. Status post ultrasound-guided fine-needle aspiration of small left hepatic lesion 12/22/2011. The pathology revealed no malignancy. 13. Malaise and weight loss. Resolved. 14. High output ileostomy. Partially improved with Imodium. Status post ileostomy reversal 05/05/2012. 15. Erectile dysfunction. He is followed by Dr. Janice Norrie. 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 17. Diarrhea after FOLFOX chemotherapy given on 07/26/2012-resolved. The bolus and infusional 5-FU were dose reduced by 20% beginning with FOLFOX on 08/17/2012.  18. History of an Irregular heart rate- Status post evaluation by Dr. Sarajane Jews on 11/02/2012 with findings of sinus rhythm with occasional PACs. 19. Diarrhea following FOLFIRI/Avastin. He reports the diarrhea is relieved with Lomotil.  20. "Sinus congestion". He is status post evaluation by ENT. Symptoms are better. 21. Hypokalemia 07/05/2013. He continues a potassium supplement.  22. Intermittent bilateral shoulder  pain. Question arthritis.    Disposition:  [name] [name] appears stable. The plan is to continue every three-week FOLFIRI/Avastin. He will return for an office visit and chemotherapy in 3 weeks. The CEA was slightly higher when he was here 01/30/2014. We will check the CEA today.  Betsy Coder, MD  03/13/2014  8:43 AM

## 2014-03-13 NOTE — Telephone Encounter (Signed)
Gave pt appt for lab and Md , emailed michelle regarding chemo for May 2015

## 2014-03-13 NOTE — Progress Notes (Signed)
OK to treat with Cr 1.6 per Dr. Benay Spice.

## 2014-03-15 ENCOUNTER — Ambulatory Visit (HOSPITAL_BASED_OUTPATIENT_CLINIC_OR_DEPARTMENT_OTHER): Payer: Medicare Other

## 2014-03-15 ENCOUNTER — Telehealth: Payer: Self-pay | Admitting: *Deleted

## 2014-03-15 ENCOUNTER — Other Ambulatory Visit: Payer: Self-pay | Admitting: *Deleted

## 2014-03-15 VITALS — BP 131/74 | HR 96 | Temp 98.3°F | Resp 16

## 2014-03-15 DIAGNOSIS — C20 Malignant neoplasm of rectum: Secondary | ICD-10-CM

## 2014-03-15 DIAGNOSIS — C7800 Secondary malignant neoplasm of unspecified lung: Secondary | ICD-10-CM

## 2014-03-15 DIAGNOSIS — C787 Secondary malignant neoplasm of liver and intrahepatic bile duct: Secondary | ICD-10-CM

## 2014-03-15 DIAGNOSIS — C772 Secondary and unspecified malignant neoplasm of intra-abdominal lymph nodes: Secondary | ICD-10-CM

## 2014-03-15 DIAGNOSIS — Z95828 Presence of other vascular implants and grafts: Secondary | ICD-10-CM

## 2014-03-15 MED ORDER — HEPARIN SOD (PORK) LOCK FLUSH 100 UNIT/ML IV SOLN
500.0000 [IU] | Freq: Once | INTRAVENOUS | Status: AC
  Administered 2014-03-15: 500 [IU] via INTRAVENOUS
  Filled 2014-03-15: qty 5

## 2014-03-15 MED ORDER — SODIUM CHLORIDE 0.9 % IJ SOLN
10.0000 mL | INTRAMUSCULAR | Status: DC | PRN
  Administered 2014-03-15: 10 mL via INTRAVENOUS
  Filled 2014-03-15: qty 10

## 2014-03-15 MED ORDER — SODIUM CHLORIDE 0.9 % IJ SOLN
10.0000 mL | INTRAMUSCULAR | Status: DC | PRN
  Filled 2014-03-15: qty 10

## 2014-03-15 MED ORDER — HEPARIN SOD (PORK) LOCK FLUSH 100 UNIT/ML IV SOLN
500.0000 [IU] | Freq: Once | INTRAVENOUS | Status: DC | PRN
  Filled 2014-03-15: qty 5

## 2014-03-15 NOTE — Telephone Encounter (Signed)
Per staff message and POF I have scheduled appts.  JMW  

## 2014-03-15 NOTE — Patient Instructions (Signed)

## 2014-03-16 ENCOUNTER — Telehealth: Payer: Self-pay | Admitting: Oncology

## 2014-03-16 NOTE — Telephone Encounter (Signed)
Called pt and left message regarding all apts for April and may , mailed all appts to pt

## 2014-03-23 ENCOUNTER — Telehealth: Payer: Self-pay | Admitting: *Deleted

## 2014-03-23 DIAGNOSIS — C20 Malignant neoplasm of rectum: Secondary | ICD-10-CM

## 2014-03-23 NOTE — Telephone Encounter (Signed)
Per Dr. Benay Spice; notified pt cea is slightly higher, MD wants to re-check cea 4/24 and confirmed appt with Lattie Haw 4/27.  Pt verbalized understanding of information.

## 2014-03-23 NOTE — Telephone Encounter (Signed)
Message copied by Wavie Hashimi P on Thu Mar 23, 2014 11:19 AM ------      Message from: Betsy Coder B      Created: Wed Mar 22, 2014  8:32 PM       Please call patient, cea slightly higher, return for lab to include cea 4/24, office lisa as scheduled 4/27 ------

## 2014-03-27 ENCOUNTER — Telehealth: Payer: Self-pay | Admitting: Oncology

## 2014-03-27 ENCOUNTER — Telehealth: Payer: Self-pay | Admitting: Internal Medicine

## 2014-03-27 DIAGNOSIS — I10 Essential (primary) hypertension: Secondary | ICD-10-CM

## 2014-03-27 DIAGNOSIS — E876 Hypokalemia: Secondary | ICD-10-CM

## 2014-03-27 MED ORDER — TERAZOSIN HCL 5 MG PO CAPS: 5.0000 mg | ORAL_CAPSULE | Freq: Every day | ORAL | Status: DC

## 2014-03-27 MED ORDER — AMLODIPINE BESYLATE 10 MG PO TABS: 10.0000 mg | ORAL_TABLET | Freq: Every day | ORAL | Status: DC

## 2014-03-27 MED ORDER — POTASSIUM CHLORIDE CRYS ER 20 MEQ PO TBCR: 20.0000 meq | EXTENDED_RELEASE_TABLET | Freq: Every day | ORAL | Status: DC

## 2014-03-27 NOTE — Telephone Encounter (Signed)
Pt has decided to cancel this wed appt, and schedule w/ dr Retail banker when he comes aboard. However, pt will need 90 day refill of the following meds sent to [facility].  Pt has changed insurance and pharm and this is a new pharm for pt. potassium chloride SA (KLOR-CON M20) 20 MEQ tablet amLODipine (NORVASC) 10 MG tablet terazosin (HYTRIN) 5 MG capsule [facility] (new mailorder for pt)  FYI: Pt would have come in for appt, but his mother passed yesterday and the funeral will probably be this friday.

## 2014-03-27 NOTE — Telephone Encounter (Signed)
rx for potassium, amlodipine, and terazosin refilled and sent to pharmacy

## 2014-03-27 NOTE — Telephone Encounter (Signed)
Pt called and r/s lab to thursday due to the date of his mother

## 2014-03-28 ENCOUNTER — Telehealth: Payer: Self-pay | Admitting: Internal Medicine

## 2014-03-28 NOTE — Telephone Encounter (Signed)
Relevant patient education mailed to patient.  

## 2014-03-29 ENCOUNTER — Ambulatory Visit: Payer: 59 | Admitting: Internal Medicine

## 2014-03-30 ENCOUNTER — Other Ambulatory Visit (HOSPITAL_BASED_OUTPATIENT_CLINIC_OR_DEPARTMENT_OTHER): Payer: Medicare Other

## 2014-03-30 DIAGNOSIS — C20 Malignant neoplasm of rectum: Secondary | ICD-10-CM

## 2014-03-30 DIAGNOSIS — C7800 Secondary malignant neoplasm of unspecified lung: Secondary | ICD-10-CM

## 2014-03-30 DIAGNOSIS — C772 Secondary and unspecified malignant neoplasm of intra-abdominal lymph nodes: Secondary | ICD-10-CM

## 2014-03-30 DIAGNOSIS — C787 Secondary malignant neoplasm of liver and intrahepatic bile duct: Secondary | ICD-10-CM

## 2014-03-30 LAB — CEA: CEA: 16.5 ng/mL — ABNORMAL HIGH (ref 0.0–5.0)

## 2014-03-31 ENCOUNTER — Other Ambulatory Visit: Payer: Medicare Other

## 2014-04-02 ENCOUNTER — Other Ambulatory Visit: Payer: Self-pay | Admitting: Oncology

## 2014-04-03 ENCOUNTER — Ambulatory Visit (HOSPITAL_BASED_OUTPATIENT_CLINIC_OR_DEPARTMENT_OTHER): Payer: Medicare Other

## 2014-04-03 ENCOUNTER — Telehealth: Payer: Self-pay | Admitting: Oncology

## 2014-04-03 ENCOUNTER — Ambulatory Visit (HOSPITAL_BASED_OUTPATIENT_CLINIC_OR_DEPARTMENT_OTHER): Payer: Medicare Other | Admitting: Nurse Practitioner

## 2014-04-03 ENCOUNTER — Other Ambulatory Visit (HOSPITAL_BASED_OUTPATIENT_CLINIC_OR_DEPARTMENT_OTHER): Payer: Medicare Other

## 2014-04-03 VITALS — BP 147/82 | HR 82

## 2014-04-03 VITALS — BP 160/86 | HR 64 | Temp 97.3°F | Resp 18 | Ht 69.0 in | Wt 173.2 lb

## 2014-04-03 DIAGNOSIS — Z5111 Encounter for antineoplastic chemotherapy: Secondary | ICD-10-CM

## 2014-04-03 DIAGNOSIS — C7800 Secondary malignant neoplasm of unspecified lung: Secondary | ICD-10-CM

## 2014-04-03 DIAGNOSIS — G620 Drug-induced polyneuropathy: Secondary | ICD-10-CM

## 2014-04-03 DIAGNOSIS — Z5112 Encounter for antineoplastic immunotherapy: Secondary | ICD-10-CM

## 2014-04-03 DIAGNOSIS — C20 Malignant neoplasm of rectum: Secondary | ICD-10-CM

## 2014-04-03 DIAGNOSIS — E876 Hypokalemia: Secondary | ICD-10-CM

## 2014-04-03 DIAGNOSIS — C772 Secondary and unspecified malignant neoplasm of intra-abdominal lymph nodes: Secondary | ICD-10-CM

## 2014-04-03 DIAGNOSIS — C787 Secondary malignant neoplasm of liver and intrahepatic bile duct: Secondary | ICD-10-CM

## 2014-04-03 DIAGNOSIS — M25519 Pain in unspecified shoulder: Secondary | ICD-10-CM

## 2014-04-03 DIAGNOSIS — R197 Diarrhea, unspecified: Secondary | ICD-10-CM

## 2014-04-03 LAB — COMPREHENSIVE METABOLIC PANEL (CC13)
ALBUMIN: 3.5 g/dL (ref 3.5–5.0)
ALT: 11 U/L (ref 0–55)
AST: 15 U/L (ref 5–34)
Alkaline Phosphatase: 127 U/L (ref 40–150)
Anion Gap: 11 mEq/L (ref 3–11)
BUN: 22.2 mg/dL (ref 7.0–26.0)
CO2: 22 meq/L (ref 22–29)
Calcium: 9.2 mg/dL (ref 8.4–10.4)
Chloride: 109 mEq/L (ref 98–109)
Creatinine: 1.5 mg/dL — ABNORMAL HIGH (ref 0.7–1.3)
GLUCOSE: 111 mg/dL (ref 70–140)
Potassium: 3.3 mEq/L — ABNORMAL LOW (ref 3.5–5.1)
SODIUM: 142 meq/L (ref 136–145)
TOTAL PROTEIN: 6.8 g/dL (ref 6.4–8.3)
Total Bilirubin: 0.6 mg/dL (ref 0.20–1.20)

## 2014-04-03 LAB — CBC WITH DIFFERENTIAL/PLATELET
BASO%: 0.5 % (ref 0.0–2.0)
Basophils Absolute: 0 10*3/uL (ref 0.0–0.1)
EOS ABS: 0.1 10*3/uL (ref 0.0–0.5)
EOS%: 1.7 % (ref 0.0–7.0)
HCT: 39.8 % (ref 38.4–49.9)
HGB: 13.4 g/dL (ref 13.0–17.1)
LYMPH%: 17 % (ref 14.0–49.0)
MCH: 35 pg — ABNORMAL HIGH (ref 27.2–33.4)
MCHC: 33.7 g/dL (ref 32.0–36.0)
MCV: 103.9 fL — ABNORMAL HIGH (ref 79.3–98.0)
MONO#: 0.5 10*3/uL (ref 0.1–0.9)
MONO%: 12 % (ref 0.0–14.0)
NEUT%: 68.8 % (ref 39.0–75.0)
NEUTROS ABS: 2.9 10*3/uL (ref 1.5–6.5)
Platelets: 188 10*3/uL (ref 140–400)
RBC: 3.83 10*6/uL — AB (ref 4.20–5.82)
RDW: 15.4 % — ABNORMAL HIGH (ref 11.0–14.6)
WBC: 4.3 10*3/uL (ref 4.0–10.3)
lymph#: 0.7 10*3/uL — ABNORMAL LOW (ref 0.9–3.3)

## 2014-04-03 LAB — UA PROTEIN, DIPSTICK - CHCC: PROTEIN: 100 mg/dL

## 2014-04-03 LAB — CEA: CEA: 17.8 ng/mL — ABNORMAL HIGH (ref 0.0–5.0)

## 2014-04-03 MED ORDER — DEXTROSE 5 % IV SOLN
144.0000 mg/m2 | Freq: Once | INTRAVENOUS | Status: AC
  Administered 2014-04-03: 290 mg via INTRAVENOUS
  Filled 2014-04-03: qty 14.5

## 2014-04-03 MED ORDER — ATROPINE SULFATE 1 MG/ML IJ SOLN
0.5000 mg | Freq: Once | INTRAMUSCULAR | Status: AC | PRN
  Administered 2014-04-03: 0.5 mg via INTRAVENOUS

## 2014-04-03 MED ORDER — ATROPINE SULFATE 1 MG/ML IJ SOLN
INTRAMUSCULAR | Status: AC
  Filled 2014-04-03: qty 1

## 2014-04-03 MED ORDER — FLUOROURACIL CHEMO INJECTION 2.5 GM/50ML
320.0000 mg/m2 | Freq: Once | INTRAVENOUS | Status: AC
  Administered 2014-04-03: 650 mg via INTRAVENOUS
  Filled 2014-04-03: qty 13

## 2014-04-03 MED ORDER — PALONOSETRON HCL INJECTION 0.25 MG/5ML
INTRAVENOUS | Status: AC
  Filled 2014-04-03: qty 5

## 2014-04-03 MED ORDER — PALONOSETRON HCL INJECTION 0.25 MG/5ML
0.2500 mg | Freq: Once | INTRAVENOUS | Status: AC
  Administered 2014-04-03: 0.25 mg via INTRAVENOUS

## 2014-04-03 MED ORDER — DEXAMETHASONE SODIUM PHOSPHATE 20 MG/5ML IJ SOLN
20.0000 mg | Freq: Once | INTRAMUSCULAR | Status: AC
  Administered 2014-04-03: 20 mg via INTRAVENOUS

## 2014-04-03 MED ORDER — BEVACIZUMAB CHEMO INJECTION 400 MG/16ML
5.0000 mg/kg | Freq: Once | INTRAVENOUS | Status: AC
  Administered 2014-04-03: 425 mg via INTRAVENOUS
  Filled 2014-04-03: qty 17

## 2014-04-03 MED ORDER — SODIUM CHLORIDE 0.9 % IV SOLN
Freq: Once | INTRAVENOUS | Status: AC
  Administered 2014-04-03: 10:00:00 via INTRAVENOUS

## 2014-04-03 MED ORDER — DEXAMETHASONE SODIUM PHOSPHATE 20 MG/5ML IJ SOLN
INTRAMUSCULAR | Status: AC
  Filled 2014-04-03: qty 5

## 2014-04-03 MED ORDER — SODIUM CHLORIDE 0.9 % IV SOLN
1920.0000 mg/m2 | INTRAVENOUS | Status: DC
  Administered 2014-04-03: 3900 mg via INTRAVENOUS
  Filled 2014-04-03: qty 78

## 2014-04-03 MED ORDER — DEXTROSE 5 % IV SOLN
317.0000 mg/m2 | Freq: Once | INTRAVENOUS | Status: AC
  Administered 2014-04-03: 640 mg via INTRAVENOUS
  Filled 2014-04-03: qty 32

## 2014-04-03 NOTE — Telephone Encounter (Signed)
gv adn printed appt sched and avs for pt for April thru June....sed added tx.

## 2014-04-03 NOTE — Progress Notes (Signed)
Per Dr. Benay Spice, okay to tx with today's labs and tx with Avastin with urine protein 100.

## 2014-04-03 NOTE — Patient Instructions (Signed)
[facility] Discharge Instructions for Patients Receiving Chemotherapy  Today you received the following chemotherapy agents: Avastin, Leucovorin, Irinotecan, Adrucil (5FU)  To help prevent nausea and vomiting after your treatment, we encourage you to take your nausea medication: Compazine 10 mg every 6 hrs as needed.  If you develop nausea and vomiting that is not controlled by your nausea medication, call the clinic.   BELOW ARE SYMPTOMS THAT SHOULD BE REPORTED IMMEDIATELY:  *FEVER GREATER THAN 100.5 F  *CHILLS WITH OR WITHOUT FEVER  NAUSEA AND VOMITING THAT IS NOT CONTROLLED WITH YOUR NAUSEA MEDICATION  *UNUSUAL SHORTNESS OF BREATH  *UNUSUAL BRUISING OR BLEEDING  TENDERNESS IN MOUTH AND THROAT WITH OR WITHOUT PRESENCE OF ULCERS  *URINARY PROBLEMS  *BOWEL PROBLEMS  UNUSUAL RASH Items with * indicate a potential emergency and should be followed up as soon as possible.  Feel free to call the clinic you have any questions or concerns. The clinic phone number is [phone].

## 2014-04-03 NOTE — Progress Notes (Signed)
[name] [name]   Diagnosis:  Metastatic rectal cancer.  INTERVAL HISTORY:   [name] [name] returns as scheduled. He continues every three-week FOLFIRI/Avastin. He had one episode of vomiting on day 2 following the most recent chemotherapy. No mouth sores. He has occasional loose stools. No pain except for chronic shoulder pain. He denies bleeding. No chest pain. He has occasional mild shortness of breath. No leg swelling or calf pain.  Objective:  Vital signs in last 24 hours:  Blood pressure 160/86, pulse 64, temperature 97.3 F (36.3 C), temperature source Oral, resp. rate 18, height 5' 9" (1.753 m), weight 173 lb 3.2 oz (78.563 kg), SpO2 100.00%.    HEENT: White coating over tongue. Resp: Lungs clear. Cardio: Regular cardiac rhythm. GI: Abdomen soft and nontender. No hepatomegaly. Vascular: No leg edema. Calves nontender.   Port-A-Cath site without erythema.    Lab Results:  Lab Results  Component Value Date   WBC 4.3 04/03/2014   HGB 13.4 04/03/2014   HCT 39.8 04/03/2014   MCV 103.9* 04/03/2014   PLT 188 04/03/2014   NEUTROABS 2.9 04/03/2014    Imaging:  No results found.  Medications: I have reviewed the patient's current medications.  Assessment/Plan: 1. Metastatic rectal cancer status post biopsy of rectal mass 09/03/2011 with pathology showing invasive adenocarcinoma. Staging CT scans 09/03/2011 showed a 10 mm hypodense rounded lesion in left lateral hepatic lobe, similar 6 mm lesion in the superior right hepatic lobe and a smaller subcapsular lesion in the lateral right hepatic lobe; rectal mass within the lumen of the bowel emanating from the left wall measuring 4.9 x 3.5 cm and a mass within the low left perirectal fat measuring 2.3 x 3.2 cm consistent with local extension of carcinoma. Staging PET scan 09/18/2011 showed a 5.1 cm distal sigmoid/rectal mass with max SUV 19.2 with local extension into the left perirectal fat with max SUV  7.7. At least 2 suspected hepatic metastases with max SUV 6.7 in the lateral segment left hepatic lobe and max SUV 4.1 in the lateral right hepatic dome. Additional tiny hypodensities in the liver may be cysts; patchy/nodular opacity in the left lower lobe with max SUV 3.5 worrisome for pulmonary metastasis, less likely infectious. He began radiation and concurrent Xeloda chemotherapy on 09/29/2011. He completed radiation on 11/06/2011. MRI of the liver on 12/08/2011 showed 2 hypermetabolic liver lesions, similar in size to 09/03/2011. 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 Equivocal 7 mm lesion in the inferior right hepatic lobe. No evidence of extrahepatic metastasis within the abdomen. An ultrasound-guided biopsy of a small left hepatic lesion on 12/23/2011 was nondiagnostic. He underwent a low anterior resection with loop ileostomy on 02/06/2012 with final pathology showing a 3.5 cm invasive adenocarcinoma with abundant extracellular mucin invading through the muscularis propria into pericolonic fatty tissue; 4 of 10 pericolonic lymph nodes were positive for metastatic carcinoma; resection margins were clear (ypT3,ypN2). He began FOLFOX chemotherapy on 03/16/2012. He completed 2 cycles of FOLFOX chemotherapy and underwent an ileostomy reversal 05/05/2012. FOLFOX was resumed on 06/14/2012 and completed on 09/14/2012. 2. CEA 13.8 on 09/17/2011; 1.6 on 12/01/2011; 8.4 on 03/09/2012, 2.1 on 05/03/2012; 2.5 on 07/26/2012; 1.8 on 10/12/2012. 3.0 on 11/23/2012. CEA elevated at 25.4 on 02/22/2013, 44.7 on 04/04/2013, and 59.1 on 05/17/2013. CEA improved at 16.8 on 08/10/2013 and 10.8 on 09/23/2013. CEA improved at 8.9 on 10/18/2013. 3. CT chest/abdomen/pelvis 03/10/2013 with 2 new left lower lobe nodules  and 2 liver lesions increased in size. Progressive metastatic disease in the lungs and liver on a restaging CT 06/07/2013. He began treatment with  FOLFIRI/Avastin on 06/21/2013. Restaging CT 09/23/2013 after 5 cycles of FOLFIRI/Avastin, with stable lung lesions and a decrease in the size of dominant liver lesions.  Cycle 6 FOLFIRI/Avastin 09/27/2013.  Cycle 7 FOLFIRI/Avastin 10/18/2013.  Cycle 8 FOLFIRI/Avastin 11/09/2013.  Cycle 9 FOLFIRI/Avastin 12/13/2013.  Cycle 10 FOLFIRI/Avastin 01/03/2014.  Cycle 11 FOLFIRI/Avastin 01/30/2014. Restaging CT 02/13/2014-no significant change in multiple lung/liver lesions. Cycle 12 FOLFIRI/Avastin 02/20/2014. Cycle 13 FOLFIRI/Avastin 03/13/2014. 4. K-ras mutation detected. 5. Rectal pain and bleeding secondary to #1. Resolved. 6. Frequent loose stools secondary to #1. Improved. 7. Hypertension. 8. Tobacco use. 9. CT scan 09/03/2011 with chronic right hydronephrosis secondary to an obstructing calculus in the mid right ureter. He is followed by Dr. Janice Norrie. Stable on the CT 06/07/2013 10. Status post cystoscopy, right retrograde pyelogram, ureteroscopy and insertion of right double-J stent 11/28/2011. The double-J stent was replaced 02/06/2012. The stent has been removed. 11. Elevated BUN/creatinine. Likely secondary to dehydration while the ileostomy was in place. He now appears to have chronic renal insufficiency. 12. Status post ultrasound-guided fine-needle aspiration of small left hepatic lesion 12/22/2011. The pathology revealed no malignancy. 13. Malaise and weight loss. Resolved. 14. High output ileostomy. Partially improved with Imodium. Status post ileostomy reversal 05/05/2012. 15. Erectile dysfunction. He is followed by Dr. Janice Norrie. 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 17. Diarrhea after FOLFOX chemotherapy given on 07/26/2012-resolved. The bolus and infusional 5-FU were dose reduced by 20% beginning with FOLFOX on 08/17/2012.  18. History of an Irregular heart rate- Status post evaluation by Dr. Sarajane Jews on 11/02/2012 with findings of sinus rhythm with occasional  PACs. 19. Diarrhea following FOLFIRI/Avastin. He reports the diarrhea is relieved with Lomotil.  20. "Sinus congestion". He is status post evaluation by ENT. Symptoms are better. 21. Hypokalemia 07/05/2013. He continues a potassium supplement.  22. Intermittent bilateral shoulder pain. Question arthritis.    Disposition: He appears stable. Plan to proceed with cycle 14 FOLFIRI/Avastin today as scheduled. He will return for a followup visit and cycle 15 in 3 weeks. He will contact the office in the interim with any problems.    [name] [name] ANP/GNP-BC   04/03/2014  9:27 AM

## 2014-04-04 ENCOUNTER — Telehealth: Payer: Self-pay | Admitting: *Deleted

## 2014-04-04 NOTE — Telephone Encounter (Signed)
Message copied by Brien Few on Tue Apr 04, 2014 11:55 AM ------      Message from: Betsy Coder B      Created: Mon Apr 03, 2014  7:21 PM       Please call patient, increase kcl to [id number] bid ------

## 2014-04-05 ENCOUNTER — Ambulatory Visit (HOSPITAL_BASED_OUTPATIENT_CLINIC_OR_DEPARTMENT_OTHER): Payer: Medicare Other

## 2014-04-05 VITALS — BP 141/73 | HR 89

## 2014-04-05 DIAGNOSIS — C20 Malignant neoplasm of rectum: Secondary | ICD-10-CM

## 2014-04-05 MED ORDER — HEPARIN SOD (PORK) LOCK FLUSH 100 UNIT/ML IV SOLN
500.0000 [IU] | Freq: Once | INTRAVENOUS | Status: AC | PRN
  Administered 2014-04-05: 500 [IU]
  Filled 2014-04-05: qty 5

## 2014-04-05 MED ORDER — SODIUM CHLORIDE 0.9 % IJ SOLN
10.0000 mL | INTRAMUSCULAR | Status: DC | PRN
  Administered 2014-04-05: 10 mL
  Filled 2014-04-05: qty 10

## 2014-04-05 NOTE — Telephone Encounter (Signed)
Spoke with pt, he reports he has been taking Potassium daily. Instructed him to increase to BID. He voiced understanding.

## 2014-04-23 ENCOUNTER — Other Ambulatory Visit: Payer: Self-pay | Admitting: Oncology

## 2014-04-24 ENCOUNTER — Telehealth: Payer: Self-pay | Admitting: Oncology

## 2014-04-24 ENCOUNTER — Encounter: Payer: Self-pay | Admitting: Oncology

## 2014-04-24 ENCOUNTER — Other Ambulatory Visit (HOSPITAL_BASED_OUTPATIENT_CLINIC_OR_DEPARTMENT_OTHER): Payer: Medicare Other

## 2014-04-24 ENCOUNTER — Ambulatory Visit (HOSPITAL_BASED_OUTPATIENT_CLINIC_OR_DEPARTMENT_OTHER): Payer: Medicare Other

## 2014-04-24 ENCOUNTER — Ambulatory Visit (HOSPITAL_BASED_OUTPATIENT_CLINIC_OR_DEPARTMENT_OTHER): Payer: Medicare Other | Admitting: Oncology

## 2014-04-24 VITALS — BP 175/85 | HR 79

## 2014-04-24 VITALS — BP 160/96 | HR 100 | Temp 97.4°F | Resp 19 | Ht 69.0 in | Wt 170.4 lb

## 2014-04-24 DIAGNOSIS — R0609 Other forms of dyspnea: Secondary | ICD-10-CM

## 2014-04-24 DIAGNOSIS — R0989 Other specified symptoms and signs involving the circulatory and respiratory systems: Secondary | ICD-10-CM

## 2014-04-24 DIAGNOSIS — C7800 Secondary malignant neoplasm of unspecified lung: Secondary | ICD-10-CM

## 2014-04-24 DIAGNOSIS — C787 Secondary malignant neoplasm of liver and intrahepatic bile duct: Secondary | ICD-10-CM

## 2014-04-24 DIAGNOSIS — C20 Malignant neoplasm of rectum: Secondary | ICD-10-CM

## 2014-04-24 DIAGNOSIS — Z5112 Encounter for antineoplastic immunotherapy: Secondary | ICD-10-CM

## 2014-04-24 DIAGNOSIS — K1370 Unspecified lesions of oral mucosa: Secondary | ICD-10-CM

## 2014-04-24 DIAGNOSIS — R970 Elevated carcinoembryonic antigen [CEA]: Secondary | ICD-10-CM

## 2014-04-24 LAB — COMPREHENSIVE METABOLIC PANEL (CC13)
ALT: 11 U/L (ref 0–55)
AST: 16 U/L (ref 5–34)
Albumin: 3.4 g/dL — ABNORMAL LOW (ref 3.5–5.0)
Alkaline Phosphatase: 120 U/L (ref 40–150)
Anion Gap: 13 mEq/L — ABNORMAL HIGH (ref 3–11)
BILIRUBIN TOTAL: 0.59 mg/dL (ref 0.20–1.20)
BUN: 23.2 mg/dL (ref 7.0–26.0)
CO2: 21 meq/L — AB (ref 22–29)
CREATININE: 1.4 mg/dL — AB (ref 0.7–1.3)
Calcium: 9.2 mg/dL (ref 8.4–10.4)
Chloride: 108 mEq/L (ref 98–109)
Glucose: 133 mg/dl (ref 70–140)
Potassium: 3.8 mEq/L (ref 3.5–5.1)
Sodium: 142 mEq/L (ref 136–145)
TOTAL PROTEIN: 6.8 g/dL (ref 6.4–8.3)

## 2014-04-24 LAB — CBC WITH DIFFERENTIAL/PLATELET
BASO%: 0.9 % (ref 0.0–2.0)
Basophils Absolute: 0 10*3/uL (ref 0.0–0.1)
EOS%: 1.9 % (ref 0.0–7.0)
Eosinophils Absolute: 0.1 10*3/uL (ref 0.0–0.5)
HCT: 40.6 % (ref 38.4–49.9)
HGB: 13.8 g/dL (ref 13.0–17.1)
LYMPH#: 0.7 10*3/uL — AB (ref 0.9–3.3)
LYMPH%: 15.9 % (ref 14.0–49.0)
MCH: 35.5 pg — ABNORMAL HIGH (ref 27.2–33.4)
MCHC: 33.9 g/dL (ref 32.0–36.0)
MCV: 104.7 fL — ABNORMAL HIGH (ref 79.3–98.0)
MONO#: 0.4 10*3/uL (ref 0.1–0.9)
MONO%: 9.8 % (ref 0.0–14.0)
NEUT#: 3.2 10*3/uL (ref 1.5–6.5)
NEUT%: 71.5 % (ref 39.0–75.0)
PLATELETS: 181 10*3/uL (ref 140–400)
RBC: 3.88 10*6/uL — AB (ref 4.20–5.82)
RDW: 14.7 % — ABNORMAL HIGH (ref 11.0–14.6)
WBC: 4.5 10*3/uL (ref 4.0–10.3)

## 2014-04-24 LAB — UA PROTEIN, DIPSTICK - CHCC: Protein, ur: 30 mg/dL

## 2014-04-24 LAB — CEA: CEA: 18.4 ng/mL — ABNORMAL HIGH (ref 0.0–5.0)

## 2014-04-24 MED ORDER — ATROPINE SULFATE 1 MG/ML IJ SOLN
0.5000 mg | Freq: Once | INTRAMUSCULAR | Status: AC | PRN
  Administered 2014-04-24: 0.5 mg via INTRAVENOUS

## 2014-04-24 MED ORDER — FLUOROURACIL CHEMO INJECTION 2.5 GM/50ML
320.0000 mg/m2 | Freq: Once | INTRAVENOUS | Status: AC
  Administered 2014-04-24: 650 mg via INTRAVENOUS
  Filled 2014-04-24: qty 13

## 2014-04-24 MED ORDER — ATROPINE SULFATE 1 MG/ML IJ SOLN
INTRAMUSCULAR | Status: AC
Start: 2014-04-24 — End: 2014-04-24
  Filled 2014-04-24: qty 1

## 2014-04-24 MED ORDER — DEXAMETHASONE SODIUM PHOSPHATE 20 MG/5ML IJ SOLN
20.0000 mg | Freq: Once | INTRAMUSCULAR | Status: AC
  Administered 2014-04-24: 20 mg via INTRAVENOUS

## 2014-04-24 MED ORDER — SODIUM CHLORIDE 0.9 % IV SOLN
Freq: Once | INTRAVENOUS | Status: AC
  Administered 2014-04-24: 10:00:00 via INTRAVENOUS

## 2014-04-24 MED ORDER — DEXTROSE 5 % IV SOLN
320.0000 mg/m2 | Freq: Once | INTRAVENOUS | Status: AC
  Administered 2014-04-24: 646 mg via INTRAVENOUS
  Filled 2014-04-24: qty 32.3

## 2014-04-24 MED ORDER — PALONOSETRON HCL INJECTION 0.25 MG/5ML
INTRAVENOUS | Status: AC
  Filled 2014-04-24: qty 5

## 2014-04-24 MED ORDER — IRINOTECAN HCL CHEMO INJECTION 100 MG/5ML
144.0000 mg/m2 | Freq: Once | INTRAVENOUS | Status: AC
  Administered 2014-04-24: 290 mg via INTRAVENOUS
  Filled 2014-04-24: qty 14.5

## 2014-04-24 MED ORDER — SODIUM CHLORIDE 0.9 % IV SOLN
400.0000 mg | Freq: Once | INTRAVENOUS | Status: AC
  Administered 2014-04-24: 400 mg via INTRAVENOUS
  Filled 2014-04-24: qty 16

## 2014-04-24 MED ORDER — SODIUM CHLORIDE 0.9 % IV SOLN
1920.0000 mg/m2 | INTRAVENOUS | Status: DC
  Administered 2014-04-24: 3900 mg via INTRAVENOUS
  Filled 2014-04-24: qty 78

## 2014-04-24 MED ORDER — PALONOSETRON HCL INJECTION 0.25 MG/5ML
0.2500 mg | Freq: Once | INTRAVENOUS | Status: AC
  Administered 2014-04-24: 0.25 mg via INTRAVENOUS

## 2014-04-24 MED ORDER — DEXAMETHASONE SODIUM PHOSPHATE 20 MG/5ML IJ SOLN
INTRAMUSCULAR | Status: AC
  Filled 2014-04-24: qty 5

## 2014-04-24 NOTE — Patient Instructions (Signed)
[facility] Discharge Instructions for Patients Receiving Chemotherapy  Today you received the following chemotherapy agents Irinotecan, 64fu To help prevent nausea and vomiting after your treatment, we encourage you to take your nausea medication as prescribed   If you develop nausea and vomiting that is not controlled by your nausea medication, call the clinic.   BELOW ARE SYMPTOMS THAT SHOULD BE REPORTED IMMEDIATELY:  *FEVER GREATER THAN 100.5 F  *CHILLS WITH OR WITHOUT FEVER  NAUSEA AND VOMITING THAT IS NOT CONTROLLED WITH YOUR NAUSEA MEDICATION  *UNUSUAL SHORTNESS OF BREATH  *UNUSUAL BRUISING OR BLEEDING  TENDERNESS IN MOUTH AND THROAT WITH OR WITHOUT PRESENCE OF ULCERS  *URINARY PROBLEMS  *BOWEL PROBLEMS  UNUSUAL RASH Items with * indicate a potential emergency and should be followed up as soon as possible.  Feel free to call the clinic you have any questions or concerns. The clinic phone number is [phone].

## 2014-04-24 NOTE — Telephone Encounter (Signed)
gv and printed appt sched and avs for tpf ro May adn June....sed added tx.

## 2014-04-24 NOTE — Progress Notes (Signed)
Per Dr Benay Spice it is okay to treat pt today with avastin and BP recordings.

## 2014-04-24 NOTE — Progress Notes (Signed)
[facility] OFFICE PROGRESS NOTE   Diagnosis: Rectal cancer  INTERVAL HISTORY:   [name] [name] completed another cycle of FOLFIRI/Avastin 04/03/2014. He reports soreness in the mouth following chemotherapy. He had diarrhea up to 4-5 times per day. This improved with Lomotil. The feet remain numb. He has exertional dyspnea.  Objective:  Vital signs in last 24 hours:  Blood pressure 160/96, pulse 100, temperature 97.4 F (36.3 C), temperature source Oral, resp. rate 19, height $RemoveBe'5\' 9"'SYZZvFLYZ$  (1.753 m), weight 170 lb 6.4 oz (77.293 kg).    HEENT: White discoloration of the left buccal mucosa, no ulcers Resp: Lungs clear bilaterally Cardio: Regular rate and rhythm GI: No hepatomegaly Vascular: No leg edema    Portacath/PICC-without erythema  Lab Results:  Lab Results  Component Value Date   WBC 4.5 04/24/2014   HGB 13.8 04/24/2014   HCT 40.6 04/24/2014   MCV 104.7* 04/24/2014   PLT 181 04/24/2014   NEUTROABS 3.2 04/24/2014     Lab Results  Component Value Date   CEA 18.4* 04/24/2014    Medications: I have reviewed the patient's current medications.  Assessment/Plan: 1. Metastatic rectal cancer status post biopsy of rectal mass 09/03/2011 with pathology showing invasive adenocarcinoma. Staging CT scans 09/03/2011 showed a 10 mm hypodense rounded lesion in left lateral hepatic lobe, similar 6 mm lesion in the superior right hepatic lobe and a smaller subcapsular lesion in the lateral right hepatic lobe; rectal mass within the lumen of the bowel emanating from the left wall measuring 4.9 x 3.5 cm and a mass within the low left perirectal fat measuring 2.3 x 3.2 cm consistent with local extension of carcinoma. Staging PET scan 09/18/2011 showed a 5.1 cm distal sigmoid/rectal mass with max SUV 19.2 with local extension into the left perirectal fat with max SUV 7.7. At least 2 suspected hepatic metastases with max SUV 6.7 in the lateral segment left hepatic lobe and max SUV 4.1 in  the lateral right hepatic dome. Additional tiny hypodensities in the liver may be cysts; patchy/nodular opacity in the left lower lobe with max SUV 3.5 worrisome for pulmonary metastasis, less likely infectious. He began radiation and concurrent Xeloda chemotherapy on 09/29/2011. He completed radiation on 11/06/2011. MRI of the liver on 12/08/2011 showed 2 hypermetabolic liver lesions, similar in size to 09/03/2011. 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 Equivocal 7 mm lesion in the inferior right hepatic lobe. No evidence of extrahepatic metastasis within the abdomen. An ultrasound-guided biopsy of a small left hepatic lesion on 12/23/2011 was nondiagnostic. He underwent a low anterior resection with loop ileostomy on 02/06/2012 with final pathology showing a 3.5 cm invasive adenocarcinoma with abundant extracellular mucin invading through the muscularis propria into pericolonic fatty tissue; 4 of 10 pericolonic lymph nodes were positive for metastatic carcinoma; resection margins were clear (ypT3,ypN2). He began FOLFOX chemotherapy on 03/16/2012. He completed 2 cycles of FOLFOX chemotherapy and underwent an ileostomy reversal 05/05/2012. FOLFOX was resumed on 06/14/2012 and completed on 09/14/2012. 2. CEA 13.8 on 09/17/2011; 1.6 on 12/01/2011; 8.4 on 03/09/2012, 2.1 on 05/03/2012; 2.5 on 07/26/2012; 1.8 on 10/12/2012. 3.0 on 11/23/2012. CEA elevated at 25.4 on 02/22/2013, 44.7 on 04/04/2013, and 59.1 on 05/17/2013. CEA improved at 16.8 on 08/10/2013 and 10.8 on 09/23/2013. CEA improved at 8.9 on 10/18/2013. 3. CT chest/abdomen/pelvis 03/10/2013 with 2 new left lower lobe nodules and 2 liver lesions increased in size. Progressive metastatic disease in the lungs and liver on a restaging  CT 06/07/2013. He began treatment with FOLFIRI/Avastin on 06/21/2013. Restaging CT 09/23/2013 after 5 cycles of FOLFIRI/Avastin, with stable lung lesions and a  decrease in the size of dominant liver lesions.  Cycle 6 FOLFIRI/Avastin 09/27/2013.  Cycle 7 FOLFIRI/Avastin 10/18/2013.  Cycle 8 FOLFIRI/Avastin 11/09/2013.  Cycle 9 FOLFIRI/Avastin 12/13/2013.  Cycle 10 FOLFIRI/Avastin 01/03/2014.  Cycle 11 FOLFIRI/Avastin 01/30/2014.  Restaging CT 02/13/2014-no significant change in multiple lung/liver lesions.  Cycle 12 FOLFIRI/Avastin 02/20/2014.  Cycle 13 FOLFIRI/Avastin 03/13/2014. Cycle 14 FOLFIRI/Avastin 04/03/2014 4. K-ras mutation detected. 5. Rectal pain and bleeding secondary to #1. Resolved. 6. Frequent loose stools secondary to #1. Improved. 7. Hypertension. 8. Tobacco use. 9. CT scan 09/03/2011 with chronic right hydronephrosis secondary to an obstructing calculus in the mid right ureter. He is followed by Dr. Janice Norrie. Stable on the CT 06/07/2013 10. Status post cystoscopy, right retrograde pyelogram, ureteroscopy and insertion of right double-J stent 11/28/2011. The double-J stent was replaced 02/06/2012. The stent has been removed. 11. Elevated BUN/creatinine. Likely secondary to dehydration while the ileostomy was in place. He now appears to have chronic renal insufficiency. 12. Status post ultrasound-guided fine-needle aspiration of small left hepatic lesion 12/22/2011. The pathology revealed no malignancy. 13. Malaise and weight loss. Resolved. 14. High output ileostomy. Partially improved with Imodium. Status post ileostomy reversal 05/05/2012. 15. Erectile dysfunction. He is followed by Dr. Janice Norrie. 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 17. Diarrhea after FOLFOX chemotherapy given on 07/26/2012-resolved. The bolus and infusional 5-FU were dose reduced by 20% beginning with FOLFOX on 08/17/2012.  18. History of an Irregular heart rate- Status post evaluation by Dr. Sarajane Jews on 11/02/2012 with findings of sinus rhythm with occasional PACs. 19. Diarrhea following FOLFIRI/Avastin. He reports the diarrhea is relieved  with Lomotil.  20. "Sinus congestion". He is status post evaluation by ENT. Symptoms are better. 21. Hypokalemia 07/05/2013. He continues a potassium supplement.  22. Intermittent bilateral shoulder pain. Question arthritis.     Disposition:  He continues treatment with FOLFIRI/Avastin. His overall status appears unchanged. The CEA is slightly higher over the past month. We will plan for a restaging CT evaluation after an additional cycle of FOLFIRI/Avastin 05/15/2014.  Ladell Pier, MD  04/24/2014  4:57 PM

## 2014-04-24 NOTE — Progress Notes (Signed)
See notes from billing                 I am not sure which one of you works with this patient but I received a call from Benjamin Perez at [employer]  and she informed me that they are terming this members eligibility because they are considering his AARP Medicare Complete insurance plan to be a Medicare plan and they dont provide co-pay assistance for patients with government funded plans.  I did inform them that this is a Medicare replacement plan that the member pays for on his own, he is still considered ineligible.  They will be requesting a refund for the dates of service that they have paid on.

## 2014-04-26 ENCOUNTER — Ambulatory Visit (HOSPITAL_BASED_OUTPATIENT_CLINIC_OR_DEPARTMENT_OTHER): Payer: Medicare Other

## 2014-04-26 VITALS — BP 143/81 | HR 98

## 2014-04-26 DIAGNOSIS — C20 Malignant neoplasm of rectum: Secondary | ICD-10-CM

## 2014-04-26 MED ORDER — HEPARIN SOD (PORK) LOCK FLUSH 100 UNIT/ML IV SOLN
500.0000 [IU] | Freq: Once | INTRAVENOUS | Status: AC | PRN
  Administered 2014-04-26: 500 [IU]
  Filled 2014-04-26: qty 5

## 2014-04-26 MED ORDER — SODIUM CHLORIDE 0.9 % IJ SOLN
10.0000 mL | INTRAMUSCULAR | Status: DC | PRN
  Administered 2014-04-26: 10 mL
  Filled 2014-04-26: qty 10

## 2014-04-28 ENCOUNTER — Telehealth: Payer: Self-pay | Admitting: Internal Medicine

## 2014-04-28 DIAGNOSIS — I10 Essential (primary) hypertension: Secondary | ICD-10-CM

## 2014-04-28 DIAGNOSIS — E876 Hypokalemia: Secondary | ICD-10-CM

## 2014-04-28 MED ORDER — TERAZOSIN HCL 5 MG PO CAPS: 5.0000 mg | ORAL_CAPSULE | Freq: Every day | ORAL | Status: DC

## 2014-04-28 MED ORDER — AMLODIPINE BESYLATE 10 MG PO TABS: 10.0000 mg | ORAL_TABLET | Freq: Every day | ORAL | Status: DC

## 2014-04-28 MED ORDER — POTASSIUM CHLORIDE CRYS ER 20 MEQ PO TBCR: 20.0000 meq | EXTENDED_RELEASE_TABLET | Freq: Every day | ORAL | Status: DC

## 2014-04-28 NOTE — Telephone Encounter (Signed)
Pt needs new rx [facility] [address] terazosine 5 mg #30 and new rxs terazosine and amlodipine and klor-con 20 meq #90 each  [facility] [id number]

## 2014-04-28 NOTE — Telephone Encounter (Signed)
New rx sent to pharmacy [facility].  Rx for terazosine 5mg  [facility].

## 2014-05-14 ENCOUNTER — Other Ambulatory Visit: Payer: Self-pay | Admitting: Oncology

## 2014-05-15 ENCOUNTER — Other Ambulatory Visit: Payer: Medicare Other

## 2014-05-15 ENCOUNTER — Telehealth: Payer: Self-pay | Admitting: *Deleted

## 2014-05-15 ENCOUNTER — Ambulatory Visit: Payer: Medicare Other

## 2014-05-15 ENCOUNTER — Telehealth: Payer: Self-pay | Admitting: Oncology

## 2014-05-15 ENCOUNTER — Ambulatory Visit: Payer: Medicare Other | Admitting: Oncology

## 2014-05-15 NOTE — Telephone Encounter (Signed)
s.w. pt and r/s appt pt ok adn aware...Marland Kitchenper Lavella Lemons ok

## 2014-05-15 NOTE — Telephone Encounter (Signed)
Patient was FTKA today.  Left voice message with phone number asking to please return call and re-schedule appointment.

## 2014-05-16 ENCOUNTER — Other Ambulatory Visit (HOSPITAL_BASED_OUTPATIENT_CLINIC_OR_DEPARTMENT_OTHER): Payer: Medicare Other

## 2014-05-16 ENCOUNTER — Telehealth: Payer: Self-pay | Admitting: Oncology

## 2014-05-16 ENCOUNTER — Ambulatory Visit (HOSPITAL_BASED_OUTPATIENT_CLINIC_OR_DEPARTMENT_OTHER): Payer: Medicare Other | Admitting: Oncology

## 2014-05-16 VITALS — BP 160/85 | HR 91 | Temp 98.1°F | Resp 18 | Ht 69.0 in | Wt 168.9 lb

## 2014-05-16 DIAGNOSIS — C7800 Secondary malignant neoplasm of unspecified lung: Secondary | ICD-10-CM

## 2014-05-16 DIAGNOSIS — E876 Hypokalemia: Secondary | ICD-10-CM

## 2014-05-16 DIAGNOSIS — I10 Essential (primary) hypertension: Secondary | ICD-10-CM

## 2014-05-16 DIAGNOSIS — T451X5A Adverse effect of antineoplastic and immunosuppressive drugs, initial encounter: Secondary | ICD-10-CM

## 2014-05-16 DIAGNOSIS — R944 Abnormal results of kidney function studies: Secondary | ICD-10-CM

## 2014-05-16 DIAGNOSIS — R197 Diarrhea, unspecified: Secondary | ICD-10-CM

## 2014-05-16 DIAGNOSIS — F17200 Nicotine dependence, unspecified, uncomplicated: Secondary | ICD-10-CM

## 2014-05-16 DIAGNOSIS — G622 Polyneuropathy due to other toxic agents: Secondary | ICD-10-CM

## 2014-05-16 DIAGNOSIS — C787 Secondary malignant neoplasm of liver and intrahepatic bile duct: Secondary | ICD-10-CM

## 2014-05-16 DIAGNOSIS — C20 Malignant neoplasm of rectum: Secondary | ICD-10-CM

## 2014-05-16 DIAGNOSIS — M25519 Pain in unspecified shoulder: Secondary | ICD-10-CM

## 2014-05-16 LAB — CBC WITH DIFFERENTIAL/PLATELET
BASO%: 0.6 % (ref 0.0–2.0)
Basophils Absolute: 0 10*3/uL (ref 0.0–0.1)
EOS ABS: 0.1 10*3/uL (ref 0.0–0.5)
EOS%: 1.5 % (ref 0.0–7.0)
HCT: 39.7 % (ref 38.4–49.9)
HGB: 13.2 g/dL (ref 13.0–17.1)
LYMPH#: 0.7 10*3/uL — AB (ref 0.9–3.3)
LYMPH%: 16.2 % (ref 14.0–49.0)
MCH: 35.4 pg — ABNORMAL HIGH (ref 27.2–33.4)
MCHC: 33.3 g/dL (ref 32.0–36.0)
MCV: 106.3 fL — ABNORMAL HIGH (ref 79.3–98.0)
MONO#: 0.5 10*3/uL (ref 0.1–0.9)
MONO%: 11.6 % (ref 0.0–14.0)
NEUT%: 70.1 % (ref 39.0–75.0)
NEUTROS ABS: 3.1 10*3/uL (ref 1.5–6.5)
PLATELETS: 176 10*3/uL (ref 140–400)
RBC: 3.74 10*6/uL — AB (ref 4.20–5.82)
RDW: 14.9 % — AB (ref 11.0–14.6)
WBC: 4.5 10*3/uL (ref 4.0–10.3)

## 2014-05-16 LAB — COMPREHENSIVE METABOLIC PANEL (CC13)
ALBUMIN: 3.2 g/dL — AB (ref 3.5–5.0)
ALT: 15 U/L (ref 0–55)
AST: 16 U/L (ref 5–34)
Alkaline Phosphatase: 132 U/L (ref 40–150)
Anion Gap: 9 mEq/L (ref 3–11)
BUN: 19.9 mg/dL (ref 7.0–26.0)
CALCIUM: 8.8 mg/dL (ref 8.4–10.4)
CHLORIDE: 111 meq/L — AB (ref 98–109)
CO2: 21 mEq/L — ABNORMAL LOW (ref 22–29)
Creatinine: 1.5 mg/dL — ABNORMAL HIGH (ref 0.7–1.3)
Glucose: 116 mg/dl (ref 70–140)
POTASSIUM: 3.7 meq/L (ref 3.5–5.1)
Sodium: 141 mEq/L (ref 136–145)
Total Bilirubin: 0.48 mg/dL (ref 0.20–1.20)
Total Protein: 6.7 g/dL (ref 6.4–8.3)

## 2014-05-16 LAB — CEA: CEA: 19.1 ng/mL — ABNORMAL HIGH (ref 0.0–5.0)

## 2014-05-16 NOTE — Progress Notes (Signed)
[place] OFFICE PROGRESS NOTE   Diagnosis: Rectal cancer  INTERVAL HISTORY:   [name] [name] completed another cycle of FOLFIRI/Avastin 04/24/2014. He had nausea and one episode of emesis on the day following chemotherapy. He did not have significant diarrhea until yesterday. He missed a scheduled appointment yesterday secondary to diarrhea. No diarrhea today. He continues to have pain in the shoulders, especially in a cold environment. He continues to work. He reports mouth soreness last week.  Objective:  Vital signs in last 24 hours:  Blood pressure 160/85, pulse 91, temperature 98.1 F (36.7 C), temperature source Oral, resp. rate 18, height _0  (1.753 m), weight 168 lb 14.4 oz (76.613 kg).    HEENT: Mild whitecoat over the tongue,? Mild thrush at the left greater than right buccal mucosa Resp: Distant breath sounds, clear bilaterally Cardio: Regular rate and rhythm GI: No hepatomegaly, nontender Vascular: No leg edema  Skin: Hyperpigmentation and skin thickening over the hands   Portacath/PICC-without erythema  Lab Results:  Lab Results  Component Value Date   WBC 4.5 05/16/2014   HGB 13.2 05/16/2014   HCT 39.7 05/16/2014   MCV 106.3* 05/16/2014   PLT 176 05/16/2014   NEUTROABS 3.1 05/16/2014   Lab Results  Component Value Date   CEA 19.1* 05/16/2014    Medications: I have reviewed the patient's current medications.  Assessment/Plan: 1. Metastatic rectal cancer status post biopsy of rectal mass 09/03/2011 with pathology showing invasive adenocarcinoma. Staging CT scans 09/03/2011 showed a 10 mm hypodense rounded lesion in left lateral hepatic lobe, similar 6 mm lesion in the superior right hepatic lobe and a smaller subcapsular lesion in the lateral right hepatic lobe; rectal mass within the lumen of the bowel emanating from the left wall measuring 4.9 x 3.5 cm and a mass within the low left perirectal fat measuring 2.3 x 3.2 cm consistent with local extension of  carcinoma. Staging PET scan 09/18/2011 showed a 5.1 cm distal sigmoid/rectal mass with max SUV 19.2 with local extension into the left perirectal fat with max SUV 7.7. At least 2 suspected hepatic metastases with max SUV 6.7 in the lateral segment left hepatic lobe and max SUV 4.1 in the lateral right hepatic dome. Additional tiny hypodensities in the liver may be cysts; patchy/nodular opacity in the left lower lobe with max SUV 3.5 worrisome for pulmonary metastasis, less likely infectious. He began radiation and concurrent Xeloda chemotherapy on 09/29/2011. He completed radiation on 11/06/2011. MRI of the liver on 12/08/2011 showed 2 hypermetabolic liver lesions, similar in size to 09/03/2011. 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 Equivocal 7 mm lesion in the inferior right hepatic lobe. No evidence of extrahepatic metastasis within the abdomen. An ultrasound-guided biopsy of a small left hepatic lesion on 12/23/2011 was nondiagnostic. He underwent a low anterior resection with loop ileostomy on 02/06/2012 with final pathology showing a 3.5 cm invasive adenocarcinoma with abundant extracellular mucin invading through the muscularis propria into pericolonic fatty tissue; 4 of 10 pericolonic lymph nodes were positive for metastatic carcinoma; resection margins were clear (ypT3,ypN2). He began FOLFOX chemotherapy on 03/16/2012. He completed 2 cycles of FOLFOX chemotherapy and underwent an ileostomy reversal 05/05/2012. FOLFOX was resumed on 06/14/2012 and completed on 09/14/2012. 2. CEA 13.8 on 09/17/2011; 1.6 on 12/01/2011; 8.4 on 03/09/2012, 2.1 on 05/03/2012; 2.5 on 07/26/2012; 1.8 on 10/12/2012. 3.0 on 11/23/2012. CEA elevated at 25.4 on 02/22/2013, 44.7 on 04/04/2013, and 59.1 on 05/17/2013. CEA improved at  16.8 on 08/10/2013 and 10.8 on 09/23/2013. CEA improved at 8.9 on 10/18/2013. 3. CT chest/abdomen/pelvis 03/10/2013 with 2 new left lower  lobe nodules and 2 liver lesions increased in size. Progressive metastatic disease in the lungs and liver on a restaging CT 06/07/2013. He began treatment with FOLFIRI/Avastin on 06/21/2013. Restaging CT 09/23/2013 after 5 cycles of FOLFIRI/Avastin, with stable lung lesions and a decrease in the size of dominant liver lesions.  Cycle 6 FOLFIRI/Avastin 09/27/2013.  Cycle 7 FOLFIRI/Avastin 10/18/2013.  Cycle 8 FOLFIRI/Avastin 11/09/2013.  Cycle 9 FOLFIRI/Avastin 12/13/2013.  Cycle 10 FOLFIRI/Avastin 01/03/2014.  Cycle 11 FOLFIRI/Avastin 01/30/2014.  Restaging CT 02/13/2014-no significant change in multiple lung/liver lesions.  Cycle 12 FOLFIRI/Avastin 02/20/2014.  Cycle 13 FOLFIRI/Avastin 03/13/2014.  Cycle 14 FOLFIRI/Avastin 04/03/2014 Cycle 15 FOLFIRI/Avastin 04/24/2014 Cycle 16 FOLFIRI/Avastin 05/16/2014 4. K-ras mutation detected. 5. Rectal pain and bleeding secondary to #1. Resolved. 6. Frequent loose stools secondary to #1. Improved. 7. Hypertension. 8. Tobacco use. 9. CT scan 09/03/2011 with chronic right hydronephrosis secondary to an obstructing calculus in the mid right ureter. He is followed by Dr. Janice Norrie. Stable on the CT 06/07/2013 10. Status post cystoscopy, right retrograde pyelogram, ureteroscopy and insertion of right double-J stent 11/28/2011. The double-J stent was replaced 02/06/2012. The stent has been removed. 11. Elevated BUN/creatinine. Likely secondary to dehydration while the ileostomy was in place. He now appears to have chronic renal insufficiency. 12. Status post ultrasound-guided fine-needle aspiration of small left hepatic lesion 12/22/2011. The pathology revealed no malignancy. 13. Malaise and weight loss. Resolved. 14. High output ileostomy. Partially improved with Imodium. Status post ileostomy reversal 05/05/2012. 15. Erectile dysfunction. He is followed by Dr. Janice Norrie. 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 17. Diarrhea after  FOLFOX chemotherapy given on 07/26/2012-resolved. The bolus and infusional 5-FU were dose reduced by 20% beginning with FOLFOX on 08/17/2012.  18. History of an Irregular heart rate- Status post evaluation by Dr. Sarajane Jews on 11/02/2012 with findings of sinus rhythm with occasional PACs. 19. Diarrhea following FOLFIRI/Avastin. He reports the diarrhea is relieved with Lomotil.  20. "Sinus congestion". He is status post evaluation by ENT. Symptoms are better. 21. Hypokalemia 07/05/2013. He continues a potassium supplement.  22. Intermittent bilateral shoulder pain. Question arthritis.    Disposition:  Mr. Disano appears stable. The CEA is stable today. He will complete another cycle of FOLFIRI/Avastin today. He will return for an office visit and scheduled chemotherapy in 3 weeks. He will complete a restaging CT evaluation in 2 weeks.  He will contact us if the thrush progresses and we will prescribe Diflucan.  Ladell Pier, MD  05/16/2014  2:25 PM

## 2014-05-16 NOTE — Telephone Encounter (Signed)
Give pt appt for lab,md and cheo for June and July 2015 gave pt oral contrast for CT

## 2014-05-17 ENCOUNTER — Ambulatory Visit (HOSPITAL_BASED_OUTPATIENT_CLINIC_OR_DEPARTMENT_OTHER): Payer: Medicare Other

## 2014-05-17 DIAGNOSIS — Z5112 Encounter for antineoplastic immunotherapy: Secondary | ICD-10-CM

## 2014-05-17 DIAGNOSIS — C20 Malignant neoplasm of rectum: Secondary | ICD-10-CM

## 2014-05-17 MED ORDER — DEXAMETHASONE SODIUM PHOSPHATE 20 MG/5ML IJ SOLN
20.0000 mg | Freq: Once | INTRAMUSCULAR | Status: AC
  Administered 2014-05-17: 20 mg via INTRAVENOUS

## 2014-05-17 MED ORDER — IRINOTECAN HCL CHEMO INJECTION 100 MG/5ML
144.0000 mg/m2 | Freq: Once | INTRAVENOUS | Status: AC
  Administered 2014-05-17: 290 mg via INTRAVENOUS
  Filled 2014-05-17: qty 14.5

## 2014-05-17 MED ORDER — SODIUM CHLORIDE 0.9 % IJ SOLN
10.0000 mL | INTRAMUSCULAR | Status: DC | PRN
  Filled 2014-05-17: qty 10

## 2014-05-17 MED ORDER — PALONOSETRON HCL INJECTION 0.25 MG/5ML
0.2500 mg | Freq: Once | INTRAVENOUS | Status: AC
  Administered 2014-05-17: 0.25 mg via INTRAVENOUS

## 2014-05-17 MED ORDER — SODIUM CHLORIDE 0.9 % IV SOLN
Freq: Once | INTRAVENOUS | Status: AC
  Administered 2014-05-17: 08:00:00 via INTRAVENOUS

## 2014-05-17 MED ORDER — DEXAMETHASONE SODIUM PHOSPHATE 20 MG/5ML IJ SOLN
INTRAMUSCULAR | Status: AC
  Filled 2014-05-17: qty 5

## 2014-05-17 MED ORDER — FLUOROURACIL CHEMO INJECTION 2.5 GM/50ML
320.0000 mg/m2 | Freq: Once | INTRAVENOUS | Status: AC
Start: 2014-05-17 — End: 2014-05-17
  Administered 2014-05-17: 650 mg via INTRAVENOUS
  Filled 2014-05-17: qty 13

## 2014-05-17 MED ORDER — FLUOROURACIL CHEMO INJECTION 5 GM/100ML
1920.0000 mg/m2 | INTRAVENOUS | Status: DC
  Administered 2014-05-17: 3900 mg via INTRAVENOUS
  Filled 2014-05-17: qty 78

## 2014-05-17 MED ORDER — PALONOSETRON HCL INJECTION 0.25 MG/5ML
INTRAVENOUS | Status: AC
  Filled 2014-05-17: qty 5

## 2014-05-17 MED ORDER — SODIUM CHLORIDE 0.9 % IV SOLN
Freq: Once | INTRAVENOUS | Status: AC
  Administered 2014-05-17: 10:00:00 via INTRAVENOUS

## 2014-05-17 MED ORDER — LEUCOVORIN CALCIUM INJECTION 350 MG
320.0000 mg/m2 | Freq: Once | INTRAVENOUS | Status: AC
  Administered 2014-05-17: 646 mg via INTRAVENOUS
  Filled 2014-05-17: qty 32.3

## 2014-05-17 MED ORDER — BEVACIZUMAB CHEMO INJECTION 400 MG/16ML
400.0000 mg | Freq: Once | INTRAVENOUS | Status: AC
  Administered 2014-05-17: 400 mg via INTRAVENOUS
  Filled 2014-05-17: qty 16

## 2014-05-17 MED ORDER — ATROPINE SULFATE 1 MG/ML IJ SOLN
INTRAMUSCULAR | Status: AC
  Filled 2014-05-17: qty 1

## 2014-05-17 MED ORDER — ATROPINE SULFATE 1 MG/ML IJ SOLN
0.5000 mg | Freq: Once | INTRAMUSCULAR | Status: AC | PRN
  Administered 2014-05-17: 0.5 mg via INTRAVENOUS

## 2014-05-17 NOTE — Progress Notes (Signed)
OK to treat with SCr 1.5 per Dr. Benay Spice.

## 2014-05-17 NOTE — Patient Instructions (Signed)
[facility] Discharge Instructions for Patients Receiving Chemotherapy  Today you received the following chemotherapy agents: avastin, irinotecan, leucovorin, 90fu and 13fu pump  To help prevent nausea and vomiting after your treatment, we encourage you to take your nausea medication.  Take it as often as prescribed.     If you develop nausea and vomiting that is not controlled by your nausea medication, call the clinic. If it is after clinic hours your family physician or the after hours number for the clinic or go to the Emergency Department.   BELOW ARE SYMPTOMS THAT SHOULD BE REPORTED IMMEDIATELY:  *FEVER GREATER THAN 100.5 F  *CHILLS WITH OR WITHOUT FEVER  NAUSEA AND VOMITING THAT IS NOT CONTROLLED WITH YOUR NAUSEA MEDICATION  *UNUSUAL SHORTNESS OF BREATH  *UNUSUAL BRUISING OR BLEEDING  TENDERNESS IN MOUTH AND THROAT WITH OR WITHOUT PRESENCE OF ULCERS  *URINARY PROBLEMS  *BOWEL PROBLEMS  UNUSUAL RASH Items with * indicate a potential emergency and should be followed up as soon as possible.  Feel free to call the clinic you have any questions or concerns. The clinic phone number is [phone].   I have been informed and understand all the instructions given to me. I know to contact the clinic, my physician, or go to the Emergency Department if any problems should occur. I do not have any questions at this time, but understand that I may call the clinic during office hours   should I have any questions or need assistance in obtaining follow up care.    __________________________________________  _____________  __________ Signature of Patient or Authorized Representative            Date                   Time    __________________________________________ Nurse's Signature

## 2014-05-19 ENCOUNTER — Ambulatory Visit (HOSPITAL_BASED_OUTPATIENT_CLINIC_OR_DEPARTMENT_OTHER): Payer: Medicare Other

## 2014-05-19 VITALS — BP 133/83 | HR 93 | Temp 97.3°F

## 2014-05-19 DIAGNOSIS — C20 Malignant neoplasm of rectum: Secondary | ICD-10-CM

## 2014-05-19 DIAGNOSIS — Z452 Encounter for adjustment and management of vascular access device: Secondary | ICD-10-CM

## 2014-05-19 MED ORDER — HEPARIN SOD (PORK) LOCK FLUSH 100 UNIT/ML IV SOLN
500.0000 [IU] | Freq: Once | INTRAVENOUS | Status: AC | PRN
Start: 2014-05-19 — End: 2014-05-19
  Administered 2014-05-19: 500 [IU]
  Filled 2014-05-19: qty 5

## 2014-05-19 MED ORDER — SODIUM CHLORIDE 0.9 % IJ SOLN
10.0000 mL | INTRAMUSCULAR | Status: DC | PRN
  Administered 2014-05-19: 10 mL
  Filled 2014-05-19: qty 10

## 2014-05-30 ENCOUNTER — Ambulatory Visit (HOSPITAL_COMMUNITY)
Admission: RE | Admit: 2014-05-30 | Discharge: 2014-05-30 | Disposition: A | Discharge status: Home / Self Care | Payer: Medicare Other | Source: Ambulatory Visit | Attending: Oncology | Admitting: Oncology

## 2014-05-30 ENCOUNTER — Encounter (HOSPITAL_COMMUNITY): Payer: Self-pay

## 2014-05-30 DIAGNOSIS — N1330 Unspecified hydronephrosis: Secondary | ICD-10-CM | POA: Insufficient documentation

## 2014-05-30 DIAGNOSIS — C20 Malignant neoplasm of rectum: Secondary | ICD-10-CM | POA: Insufficient documentation

## 2014-05-30 DIAGNOSIS — K838 Other specified diseases of biliary tract: Secondary | ICD-10-CM | POA: Insufficient documentation

## 2014-05-30 DIAGNOSIS — C787 Secondary malignant neoplasm of liver and intrahepatic bile duct: Secondary | ICD-10-CM | POA: Insufficient documentation

## 2014-05-30 DIAGNOSIS — N201 Calculus of ureter: Secondary | ICD-10-CM | POA: Insufficient documentation

## 2014-05-30 MED ORDER — IOHEXOL 300 MG/ML  SOLN
100.0000 mL | Freq: Once | INTRAMUSCULAR | Status: AC | PRN
  Administered 2014-05-30: 100 mL via INTRAVENOUS

## 2014-06-04 ENCOUNTER — Other Ambulatory Visit: Payer: Self-pay | Admitting: Oncology

## 2014-06-05 ENCOUNTER — Telehealth: Payer: Self-pay | Admitting: *Deleted

## 2014-06-05 ENCOUNTER — Other Ambulatory Visit (HOSPITAL_BASED_OUTPATIENT_CLINIC_OR_DEPARTMENT_OTHER): Payer: Medicare Other

## 2014-06-05 ENCOUNTER — Telehealth: Payer: Self-pay | Admitting: Oncology

## 2014-06-05 ENCOUNTER — Ambulatory Visit: Payer: Medicare Other

## 2014-06-05 ENCOUNTER — Ambulatory Visit (HOSPITAL_BASED_OUTPATIENT_CLINIC_OR_DEPARTMENT_OTHER): Payer: Medicare Other | Admitting: Nurse Practitioner

## 2014-06-05 ENCOUNTER — Inpatient Hospital Stay: Payer: Medicare Other

## 2014-06-05 VITALS — BP 131/80 | HR 105 | Temp 97.9°F | Resp 20 | Ht 69.0 in | Wt 169.9 lb

## 2014-06-05 DIAGNOSIS — C7800 Secondary malignant neoplasm of unspecified lung: Secondary | ICD-10-CM

## 2014-06-05 DIAGNOSIS — C20 Malignant neoplasm of rectum: Secondary | ICD-10-CM

## 2014-06-05 DIAGNOSIS — R197 Diarrhea, unspecified: Secondary | ICD-10-CM

## 2014-06-05 DIAGNOSIS — E876 Hypokalemia: Secondary | ICD-10-CM

## 2014-06-05 LAB — CBC WITH DIFFERENTIAL/PLATELET
BASO%: 0.4 % (ref 0.0–2.0)
BASOS ABS: 0 10*3/uL (ref 0.0–0.1)
EOS%: 3.2 % (ref 0.0–7.0)
Eosinophils Absolute: 0.1 10*3/uL (ref 0.0–0.5)
HEMATOCRIT: 38.7 % (ref 38.4–49.9)
HEMOGLOBIN: 13.2 g/dL (ref 13.0–17.1)
LYMPH%: 29.6 % (ref 14.0–49.0)
MCH: 35.2 pg — ABNORMAL HIGH (ref 27.2–33.4)
MCHC: 34.1 g/dL (ref 32.0–36.0)
MCV: 103.2 fL — ABNORMAL HIGH (ref 79.3–98.0)
MONO#: 0.5 10*3/uL (ref 0.1–0.9)
MONO%: 17 % — ABNORMAL HIGH (ref 0.0–14.0)
NEUT#: 1.4 10*3/uL — ABNORMAL LOW (ref 1.5–6.5)
NEUT%: 49.8 % (ref 39.0–75.0)
Platelets: 187 10*3/uL (ref 140–400)
RBC: 3.75 10*6/uL — ABNORMAL LOW (ref 4.20–5.82)
RDW: 14.1 % (ref 11.0–14.6)
WBC: 2.8 10*3/uL — ABNORMAL LOW (ref 4.0–10.3)
lymph#: 0.8 10*3/uL — ABNORMAL LOW (ref 0.9–3.3)
nRBC: 0 % (ref 0–0)

## 2014-06-05 LAB — COMPREHENSIVE METABOLIC PANEL (CC13)
ALK PHOS: 126 U/L (ref 40–150)
ALT: 12 U/L (ref 0–55)
ANION GAP: 10 meq/L (ref 3–11)
AST: 17 U/L (ref 5–34)
Albumin: 3.5 g/dL (ref 3.5–5.0)
BUN: 26.6 mg/dL — AB (ref 7.0–26.0)
CO2: 20 meq/L — AB (ref 22–29)
Calcium: 9 mg/dL (ref 8.4–10.4)
Chloride: 111 mEq/L — ABNORMAL HIGH (ref 98–109)
Creatinine: 1.5 mg/dL — ABNORMAL HIGH (ref 0.7–1.3)
GLUCOSE: 99 mg/dL (ref 70–140)
POTASSIUM: 3.9 meq/L (ref 3.5–5.1)
Sodium: 141 mEq/L (ref 136–145)
Total Bilirubin: 0.72 mg/dL (ref 0.20–1.20)
Total Protein: 6.9 g/dL (ref 6.4–8.3)

## 2014-06-05 LAB — UA PROTEIN, DIPSTICK - CHCC: Protein, ur: 300 mg/dL

## 2014-06-05 LAB — CEA: CEA: 18.9 ng/mL — AB (ref 0.0–5.0)

## 2014-06-05 NOTE — Progress Notes (Signed)
[facility] OFFICE PROGRESS NOTE   Diagnosis:  Rectal cancer.  INTERVAL HISTORY:   [name] [name] returns as scheduled. He completed cycle 16 FOLFIRI/Avastin on 05/16/2014. He had some nausea/vomiting. None recently. He has intermittent loose stools. He estimates 4 or 5 loose stools since midnight. No mouth sores though he does note tenderness in his mouth. He is tolerating fluids without difficulty. No abdominal pain. No bleeding. He notes bilateral "aching" shoulder pain when he is in air conditioning.  Objective:  Vital signs in last 24 hours:  Blood pressure 131/80, pulse 105, temperature 97.9 F (36.6 C), temperature source Oral, resp. rate 20, height $RemoveBe'5\' 9"'rSWrsTLMg$  (1.753 m), weight 169 lb 14.4 oz (77.066 kg).    HEENT: No thrush or ulcerations. Resp: Lungs clear. Cardio: Regular cardiac rhythm. Distant heart sounds. GI: Soft and nontender. No hepatomegaly. Vascular: No leg edema. Calves soft and nontender.  Port-A-Cath site without erythema.    Lab Results:  Lab Results  Component Value Date   WBC 2.8* 06/05/2014   HGB 13.2 06/05/2014   HCT 38.7 06/05/2014   MCV 103.2* 06/05/2014   PLT 187 06/05/2014   NEUTROABS 1.4* 06/05/2014    Imaging:  No results found.  Medications: I have reviewed the patient's current medications.  Assessment/Plan: 1. Metastatic rectal cancer status post biopsy of rectal mass 09/03/2011 with pathology showing invasive adenocarcinoma. Staging CT scans 09/03/2011 showed a 10 mm hypodense rounded lesion in left lateral hepatic lobe, similar 6 mm lesion in the superior right hepatic lobe and a smaller subcapsular lesion in the lateral right hepatic lobe; rectal mass within the lumen of the bowel emanating from the left wall measuring 4.9 x 3.5 cm and a mass within the low left perirectal fat measuring 2.3 x 3.2 cm consistent with local extension of carcinoma. Staging PET scan 09/18/2011 showed a 5.1 cm distal sigmoid/rectal mass with max SUV  19.2 with local extension into the left perirectal fat with max SUV 7.7. At least 2 suspected hepatic metastases with max SUV 6.7 in the lateral segment left hepatic lobe and max SUV 4.1 in the lateral right hepatic dome. Additional tiny hypodensities in the liver may be cysts; patchy/nodular opacity in the left lower lobe with max SUV 3.5 worrisome for pulmonary metastasis, less likely infectious. He began radiation and concurrent Xeloda chemotherapy on 09/29/2011. He completed radiation on 11/06/2011. MRI of the liver on 12/08/2011 showed 2 hypermetabolic liver lesions, similar in size to 09/03/2011. 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 Equivocal 7 mm lesion in the inferior right hepatic lobe. No evidence of extrahepatic metastasis within the abdomen. An ultrasound-guided biopsy of a small left hepatic lesion on 12/23/2011 was nondiagnostic. He underwent a low anterior resection with loop ileostomy on 02/06/2012 with final pathology showing a 3.5 cm invasive adenocarcinoma with abundant extracellular mucin invading through the muscularis propria into pericolonic fatty tissue; 4 of 10 pericolonic lymph nodes were positive for metastatic carcinoma; resection margins were clear (ypT3,ypN2). He began FOLFOX chemotherapy on 03/16/2012. He completed 2 cycles of FOLFOX chemotherapy and underwent an ileostomy reversal 05/05/2012. FOLFOX was resumed on 06/14/2012 and completed on 09/14/2012. 2. CEA 13.8 on 09/17/2011; 1.6 on 12/01/2011; 8.4 on 03/09/2012, 2.1 on 05/03/2012; 2.5 on 07/26/2012; 1.8 on 10/12/2012. 3.0 on 11/23/2012. CEA elevated at 25.4 on 02/22/2013, 44.7 on 04/04/2013, and 59.1 on 05/17/2013. CEA improved at 16.8 on 08/10/2013 and 10.8 on 09/23/2013. CEA improved at 8.9 on 10/18/2013. 3. CT chest/abdomen/pelvis  03/10/2013 with 2 new left lower lobe nodules and 2 liver lesions increased in size. Progressive metastatic disease in the lungs  and liver on a restaging CT 06/07/2013. He began treatment with FOLFIRI/Avastin on 06/21/2013. Restaging CT 09/23/2013 after 5 cycles of FOLFIRI/Avastin, with stable lung lesions and a decrease in the size of dominant liver lesions.  Cycle 6 FOLFIRI/Avastin 09/27/2013.  Cycle 7 FOLFIRI/Avastin 10/18/2013.  Cycle 8 FOLFIRI/Avastin 11/09/2013.  Cycle 9 FOLFIRI/Avastin 12/13/2013.  Cycle 10 FOLFIRI/Avastin 01/03/2014.  Cycle 11 FOLFIRI/Avastin 01/30/2014.  Restaging CT 02/13/2014-no significant change in multiple lung/liver lesions.  Cycle 12 FOLFIRI/Avastin 02/20/2014.  Cycle 13 FOLFIRI/Avastin 03/13/2014.  Cycle 14 FOLFIRI/Avastin 04/03/2014  Cycle 15 FOLFIRI/Avastin 04/24/2014  Cycle 16 FOLFIRI/Avastin 05/16/2014. Restaging CT evaluation 05/30/2014. No significant change in pulmonary lesions. No significant change in hepatic metastasis. 4. K-ras mutation detected. 5. Rectal pain and bleeding secondary to #1. Resolved. 6. Frequent loose stools secondary to #1. Improved. 7. Hypertension. 8. Tobacco use. 9. CT scan 09/03/2011 with chronic right hydronephrosis secondary to an obstructing calculus in the mid right ureter. He is followed by Dr. Janice Norrie. Stable on the CT 06/07/2013 10. Status post cystoscopy, right retrograde pyelogram, ureteroscopy and insertion of right double-J stent 11/28/2011. The double-J stent was replaced 02/06/2012. The stent has been removed. 11. Elevated BUN/creatinine. Likely secondary to dehydration while the ileostomy was in place. He now appears to have chronic renal insufficiency. 12. Status post ultrasound-guided fine-needle aspiration of small left hepatic lesion 12/22/2011. The pathology revealed no malignancy. 13. Malaise and weight loss. Resolved. 14. High output ileostomy. Partially improved with Imodium. Status post ileostomy reversal 05/05/2012. 15. Erectile dysfunction. He is followed by Dr. Janice Norrie. 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 17. Diarrhea after FOLFOX chemotherapy given on 07/26/2012-resolved. The bolus and infusional 5-FU were dose reduced by 20% beginning with FOLFOX on 08/17/2012.  18. History of an Irregular heart rate- Status post evaluation by Dr. Sarajane Jews on 11/02/2012 with findings of sinus rhythm with occasional PACs. 19. Diarrhea following FOLFIRI/Avastin. He reports the diarrhea is relieved with Lomotil.  20. "Sinus congestion". He is status post evaluation by ENT. Symptoms are better. 21. Hypokalemia 07/05/2013. He continues a potassium supplement.  22. Intermittent bilateral shoulder pain. Question arthritis.    Disposition: [name] [name] appears stable. He has now completed 16 cycles of FOLFIRI/Avastin. Recent restaging CT evaluation was stable.  He continues to have intermittent diarrhea. This is likely due to the irinotecan. At this point Dr. Benay Spice recommends discontinuation of irinotecan from the regimen. The 5-FU/leucovorin and Avastin will continue as per the FOLFIRI regimen on a 3 week schedule.  The urine protein was elevated by dipstick today. He will complete a 24-hour urine this week.  We are holding today's treatment due to the diarrhea. He will complete a 24-hour urine as noted above. He will return for cycle 17 on 06/12/2014. We will see him in followup on 07/03/2014. He will contact the office in the interim with any problems.  Plan reviewed with Dr. Benay Spice.    Ned Card ANP/GNP-BC   06/05/2014  10:18 AM

## 2014-06-05 NOTE — Telephone Encounter (Signed)
gv and printed appt sched and avs fo rpt for July...sed added tx....sent pt to lab

## 2014-06-05 NOTE — Telephone Encounter (Signed)
Per staff message and POF I have scheduled appts. Advised scheduler of appts. JMW  

## 2014-06-08 LAB — PROTEIN, URINE, 24 HOUR
COLLECTION INTERVAL-UPROT: 24 h
Protein, 24H Urine: 702 mg/d — ABNORMAL HIGH (ref 50–100)
Protein, Urine: 39 mg/dL
Urine Total Volume-UPROT: 1800 mL

## 2014-06-11 ENCOUNTER — Other Ambulatory Visit: Payer: Self-pay | Admitting: Oncology

## 2014-06-12 ENCOUNTER — Other Ambulatory Visit (HOSPITAL_BASED_OUTPATIENT_CLINIC_OR_DEPARTMENT_OTHER): Payer: Medicare Other

## 2014-06-12 ENCOUNTER — Ambulatory Visit (HOSPITAL_BASED_OUTPATIENT_CLINIC_OR_DEPARTMENT_OTHER): Payer: Medicare Other

## 2014-06-12 VITALS — BP 126/77 | HR 99 | Temp 97.2°F | Resp 18

## 2014-06-12 DIAGNOSIS — C787 Secondary malignant neoplasm of liver and intrahepatic bile duct: Secondary | ICD-10-CM

## 2014-06-12 DIAGNOSIS — C7800 Secondary malignant neoplasm of unspecified lung: Secondary | ICD-10-CM

## 2014-06-12 DIAGNOSIS — C20 Malignant neoplasm of rectum: Secondary | ICD-10-CM

## 2014-06-12 DIAGNOSIS — Z5112 Encounter for antineoplastic immunotherapy: Secondary | ICD-10-CM

## 2014-06-12 DIAGNOSIS — Z5111 Encounter for antineoplastic chemotherapy: Secondary | ICD-10-CM

## 2014-06-12 LAB — COMPREHENSIVE METABOLIC PANEL (CC13)
ALT: 15 U/L (ref 0–55)
ANION GAP: 10 meq/L (ref 3–11)
AST: 17 U/L (ref 5–34)
Albumin: 3.3 g/dL — ABNORMAL LOW (ref 3.5–5.0)
Alkaline Phosphatase: 120 U/L (ref 40–150)
BILIRUBIN TOTAL: 0.6 mg/dL (ref 0.20–1.20)
BUN: 24.5 mg/dL (ref 7.0–26.0)
CO2: 23 mEq/L (ref 22–29)
Calcium: 8.8 mg/dL (ref 8.4–10.4)
Chloride: 109 mEq/L (ref 98–109)
Creatinine: 1.4 mg/dL — ABNORMAL HIGH (ref 0.7–1.3)
GLUCOSE: 161 mg/dL — AB (ref 70–140)
Potassium: 3.8 mEq/L (ref 3.5–5.1)
SODIUM: 141 meq/L (ref 136–145)
Total Protein: 6.8 g/dL (ref 6.4–8.3)

## 2014-06-12 LAB — CBC WITH DIFFERENTIAL/PLATELET
BASO%: 0.3 % (ref 0.0–2.0)
Basophils Absolute: 0 10*3/uL (ref 0.0–0.1)
EOS ABS: 0.1 10*3/uL (ref 0.0–0.5)
EOS%: 1.1 % (ref 0.0–7.0)
HEMATOCRIT: 38.6 % (ref 38.4–49.9)
HGB: 13.2 g/dL (ref 13.0–17.1)
LYMPH%: 16.4 % (ref 14.0–49.0)
MCH: 35.4 pg — ABNORMAL HIGH (ref 27.2–33.4)
MCHC: 34.2 g/dL (ref 32.0–36.0)
MCV: 103.5 fL — AB (ref 79.3–98.0)
MONO#: 0.5 10*3/uL (ref 0.1–0.9)
MONO%: 7.3 % (ref 0.0–14.0)
NEUT%: 74.9 % (ref 39.0–75.0)
NEUTROS ABS: 5.3 10*3/uL (ref 1.5–6.5)
PLATELETS: 153 10*3/uL (ref 140–400)
RBC: 3.73 10*6/uL — AB (ref 4.20–5.82)
RDW: 14 % (ref 11.0–14.6)
WBC: 7.1 10*3/uL (ref 4.0–10.3)
lymph#: 1.2 10*3/uL (ref 0.9–3.3)

## 2014-06-12 LAB — UA PROTEIN, DIPSTICK - CHCC: PROTEIN: 300 mg/dL

## 2014-06-12 MED ORDER — SODIUM CHLORIDE 0.9 % IV SOLN
400.0000 mg | Freq: Once | INTRAVENOUS | Status: AC
  Administered 2014-06-12: 400 mg via INTRAVENOUS
  Filled 2014-06-12: qty 16

## 2014-06-12 MED ORDER — FLUOROURACIL CHEMO INJECTION 2.5 GM/50ML
320.0000 mg/m2 | Freq: Once | INTRAVENOUS | Status: AC
  Administered 2014-06-12: 650 mg via INTRAVENOUS
  Filled 2014-06-12: qty 13

## 2014-06-12 MED ORDER — SODIUM CHLORIDE 0.9 % IV SOLN
1920.0000 mg/m2 | INTRAVENOUS | Status: DC
  Administered 2014-06-12: 3900 mg via INTRAVENOUS
  Filled 2014-06-12: qty 78

## 2014-06-12 MED ORDER — LEUCOVORIN CALCIUM INJECTION 350 MG
320.0000 mg/m2 | Freq: Once | INTRAMUSCULAR | Status: AC
  Administered 2014-06-12: 646 mg via INTRAVENOUS
  Filled 2014-06-12: qty 32.3

## 2014-06-12 MED ORDER — SODIUM CHLORIDE 0.9 % IV SOLN
Freq: Once | INTRAVENOUS | Status: AC
  Administered 2014-06-12: 09:00:00 via INTRAVENOUS

## 2014-06-12 NOTE — Patient Instructions (Signed)
[facility] Discharge Instructions for Patients Receiving Chemotherapy  Today you received the following chemotherapy agents Leucovorin, Avastin, Adrucil  To help prevent nausea and vomiting after your treatment, we encourage you to take your nausea medication as prescribed.   If you develop nausea and vomiting that is not controlled by your nausea medication, call the clinic.   BELOW ARE SYMPTOMS THAT SHOULD BE REPORTED IMMEDIATELY:  *FEVER GREATER THAN 100.5 F  *CHILLS WITH OR WITHOUT FEVER  NAUSEA AND VOMITING THAT IS NOT CONTROLLED WITH YOUR NAUSEA MEDICATION  *UNUSUAL SHORTNESS OF BREATH  *UNUSUAL BRUISING OR BLEEDING  TENDERNESS IN MOUTH AND THROAT WITH OR WITHOUT PRESENCE OF ULCERS  *URINARY PROBLEMS  *BOWEL PROBLEMS  UNUSUAL RASH Items with * indicate a potential emergency and should be followed up as soon as possible.  Feel free to call the clinic you have any questions or concerns. The clinic phone number is [phone].

## 2014-06-12 NOTE — Progress Notes (Signed)
Per pharmacy okay to treat since 24 hour urine protein is less than 2 grams.

## 2014-06-14 ENCOUNTER — Ambulatory Visit (HOSPITAL_BASED_OUTPATIENT_CLINIC_OR_DEPARTMENT_OTHER): Payer: Medicare Other

## 2014-06-14 VITALS — BP 131/87 | HR 98 | Temp 97.9°F

## 2014-06-14 DIAGNOSIS — C20 Malignant neoplasm of rectum: Secondary | ICD-10-CM

## 2014-06-14 DIAGNOSIS — C7800 Secondary malignant neoplasm of unspecified lung: Secondary | ICD-10-CM

## 2014-06-14 DIAGNOSIS — C787 Secondary malignant neoplasm of liver and intrahepatic bile duct: Secondary | ICD-10-CM

## 2014-06-14 MED ORDER — HEPARIN SOD (PORK) LOCK FLUSH 100 UNIT/ML IV SOLN
500.0000 [IU] | Freq: Once | INTRAVENOUS | Status: AC | PRN
  Administered 2014-06-14: 500 [IU]
  Filled 2014-06-14: qty 5

## 2014-06-14 MED ORDER — SODIUM CHLORIDE 0.9 % IJ SOLN
10.0000 mL | INTRAMUSCULAR | Status: DC | PRN
  Administered 2014-06-14: 10 mL
  Filled 2014-06-14: qty 10

## 2014-06-27 ENCOUNTER — Telehealth: Payer: Self-pay | Admitting: Internal Medicine

## 2014-06-27 NOTE — Telephone Encounter (Signed)
lmovm for pt to c/b and reschedule for 07/11/14 at 9am or 07/13/14 at 9am ck

## 2014-07-02 ENCOUNTER — Other Ambulatory Visit: Payer: Self-pay | Admitting: Oncology

## 2014-07-03 ENCOUNTER — Other Ambulatory Visit (HOSPITAL_BASED_OUTPATIENT_CLINIC_OR_DEPARTMENT_OTHER): Payer: Medicare Other

## 2014-07-03 ENCOUNTER — Ambulatory Visit (HOSPITAL_BASED_OUTPATIENT_CLINIC_OR_DEPARTMENT_OTHER): Payer: Medicare Other | Admitting: Nurse Practitioner

## 2014-07-03 ENCOUNTER — Telehealth: Payer: Self-pay | Admitting: Oncology

## 2014-07-03 ENCOUNTER — Telehealth: Payer: Self-pay | Admitting: *Deleted

## 2014-07-03 ENCOUNTER — Ambulatory Visit (HOSPITAL_BASED_OUTPATIENT_CLINIC_OR_DEPARTMENT_OTHER): Payer: Medicare Other

## 2014-07-03 VITALS — BP 187/92 | HR 79

## 2014-07-03 VITALS — BP 155/95 | HR 88 | Temp 98.6°F | Resp 18 | Ht 69.0 in | Wt 169.4 lb

## 2014-07-03 DIAGNOSIS — M25519 Pain in unspecified shoulder: Secondary | ICD-10-CM

## 2014-07-03 DIAGNOSIS — T451X5A Adverse effect of antineoplastic and immunosuppressive drugs, initial encounter: Secondary | ICD-10-CM

## 2014-07-03 DIAGNOSIS — C7800 Secondary malignant neoplasm of unspecified lung: Secondary | ICD-10-CM

## 2014-07-03 DIAGNOSIS — E876 Hypokalemia: Secondary | ICD-10-CM

## 2014-07-03 DIAGNOSIS — C787 Secondary malignant neoplasm of liver and intrahepatic bile duct: Secondary | ICD-10-CM

## 2014-07-03 DIAGNOSIS — Z5112 Encounter for antineoplastic immunotherapy: Secondary | ICD-10-CM

## 2014-07-03 DIAGNOSIS — C20 Malignant neoplasm of rectum: Secondary | ICD-10-CM

## 2014-07-03 DIAGNOSIS — G622 Polyneuropathy due to other toxic agents: Secondary | ICD-10-CM

## 2014-07-03 DIAGNOSIS — I10 Essential (primary) hypertension: Secondary | ICD-10-CM

## 2014-07-03 DIAGNOSIS — F17200 Nicotine dependence, unspecified, uncomplicated: Secondary | ICD-10-CM

## 2014-07-03 DIAGNOSIS — Z5111 Encounter for antineoplastic chemotherapy: Secondary | ICD-10-CM

## 2014-07-03 DIAGNOSIS — R197 Diarrhea, unspecified: Secondary | ICD-10-CM

## 2014-07-03 LAB — CBC WITH DIFFERENTIAL/PLATELET
BASO%: 0.5 % (ref 0.0–2.0)
Basophils Absolute: 0 10*3/uL (ref 0.0–0.1)
EOS%: 1.7 % (ref 0.0–7.0)
Eosinophils Absolute: 0.1 10*3/uL (ref 0.0–0.5)
HCT: 39.8 % (ref 38.4–49.9)
HGB: 13.2 g/dL (ref 13.0–17.1)
LYMPH#: 0.7 10*3/uL — AB (ref 0.9–3.3)
LYMPH%: 11.6 % — ABNORMAL LOW (ref 14.0–49.0)
MCH: 34.9 pg — AB (ref 27.2–33.4)
MCHC: 33.2 g/dL (ref 32.0–36.0)
MCV: 105.2 fL — ABNORMAL HIGH (ref 79.3–98.0)
MONO#: 0.6 10*3/uL (ref 0.1–0.9)
MONO%: 10.3 % (ref 0.0–14.0)
NEUT%: 75.9 % — ABNORMAL HIGH (ref 39.0–75.0)
NEUTROS ABS: 4.5 10*3/uL (ref 1.5–6.5)
Platelets: 195 10*3/uL (ref 140–400)
RBC: 3.78 10*6/uL — ABNORMAL LOW (ref 4.20–5.82)
RDW: 14.6 % (ref 11.0–14.6)
WBC: 6 10*3/uL (ref 4.0–10.3)

## 2014-07-03 LAB — COMPREHENSIVE METABOLIC PANEL (CC13)
ALBUMIN: 3.4 g/dL — AB (ref 3.5–5.0)
ALT: 12 U/L (ref 0–55)
AST: 18 U/L (ref 5–34)
Alkaline Phosphatase: 123 U/L (ref 40–150)
Anion Gap: 10 mEq/L (ref 3–11)
BUN: 23.7 mg/dL (ref 7.0–26.0)
CALCIUM: 9.1 mg/dL (ref 8.4–10.4)
CO2: 24 mEq/L (ref 22–29)
Chloride: 110 mEq/L — ABNORMAL HIGH (ref 98–109)
Creatinine: 1.6 mg/dL — ABNORMAL HIGH (ref 0.7–1.3)
Glucose: 88 mg/dl (ref 70–140)
POTASSIUM: 3.4 meq/L — AB (ref 3.5–5.1)
Sodium: 144 mEq/L (ref 136–145)
Total Bilirubin: 0.64 mg/dL (ref 0.20–1.20)
Total Protein: 6.9 g/dL (ref 6.4–8.3)

## 2014-07-03 LAB — UA PROTEIN, DIPSTICK - CHCC: Protein, ur: 100 mg/dL

## 2014-07-03 MED ORDER — SODIUM CHLORIDE 0.9 % IV SOLN
Freq: Once | INTRAVENOUS | Status: AC
  Administered 2014-07-03: 11:00:00 via INTRAVENOUS

## 2014-07-03 MED ORDER — SODIUM CHLORIDE 0.9 % IV SOLN
4.8000 mg/kg | Freq: Once | INTRAVENOUS | Status: AC
  Administered 2014-07-03: 400 mg via INTRAVENOUS
  Filled 2014-07-03: qty 16

## 2014-07-03 MED ORDER — SODIUM CHLORIDE 0.9 % IV SOLN
1920.0000 mg/m2 | INTRAVENOUS | Status: DC
  Administered 2014-07-03: 3900 mg via INTRAVENOUS
  Filled 2014-07-03: qty 78

## 2014-07-03 MED ORDER — TRAMADOL HCL 50 MG PO TABS: 50.0000 mg | ORAL_TABLET | Freq: Two times a day (BID) | ORAL | Status: DC | PRN

## 2014-07-03 MED ORDER — DEXTROSE 5 % IV SOLN
320.0000 mg/m2 | Freq: Once | INTRAVENOUS | Status: AC
  Administered 2014-07-03: 646 mg via INTRAVENOUS
  Filled 2014-07-03: qty 32.3

## 2014-07-03 MED ORDER — FLUOROURACIL CHEMO INJECTION 2.5 GM/50ML
320.0000 mg/m2 | Freq: Once | INTRAVENOUS | Status: AC
Start: 2014-07-03 — End: 2014-07-03
  Administered 2014-07-03: 650 mg via INTRAVENOUS
  Filled 2014-07-03: qty 13

## 2014-07-03 NOTE — Telephone Encounter (Signed)
Per order from Dr. Benay Spice, MSI testing was requested on Case No: [id number].  Spoke with J. Epps in pathology.

## 2014-07-03 NOTE — Patient Instructions (Signed)
[facility] Discharge Instructions for Patients Receiving Chemotherapy  Today you received the following chemotherapy agents: Avastin, Leucovorin, and Adrucil.  To help prevent nausea and vomiting after your treatment, we encourage you to take your nausea medication as prescribed.   If you develop nausea and vomiting that is not controlled by your nausea medication, call the clinic.   BELOW ARE SYMPTOMS THAT SHOULD BE REPORTED IMMEDIATELY:  *FEVER GREATER THAN 100.5 F  *CHILLS WITH OR WITHOUT FEVER  NAUSEA AND VOMITING THAT IS NOT CONTROLLED WITH YOUR NAUSEA MEDICATION  *UNUSUAL SHORTNESS OF BREATH  *UNUSUAL BRUISING OR BLEEDING  TENDERNESS IN MOUTH AND THROAT WITH OR WITHOUT PRESENCE OF ULCERS  *URINARY PROBLEMS  *BOWEL PROBLEMS  UNUSUAL RASH Items with * indicate a potential emergency and should be followed up as soon as possible.  Feel free to call the clinic you have any questions or concerns. The clinic phone number is [phone].

## 2014-07-03 NOTE — Progress Notes (Signed)
Per Dr. Benay Spice, okay to treat today with Creatinine of 1.6 and urine protein of 100.    12:30: Dr. Benay Spice notified of pt's BP increase after Avastin to 187/92. Pt states he has not taken his Norvasc today. Dr. Benay Spice states to have pt take his Norvasc when he gets home today.

## 2014-07-03 NOTE — Progress Notes (Signed)
[name] OFFICE PROGRESS NOTE   Diagnosis:  Rectal cancer.  INTERVAL HISTORY:   Mr. Ambrosini returns as scheduled. He completed cycle 17 systemic therapy on 06/12/2014. Irinotecan was eliminated from the regimen with cycle 17.  He overall feels better. He rarely has diarrhea. He continues to have nausea on day 2. He had a single mouth sore which has resolved. No hand or foot pain or redness. He has persistent numbness in the feet. He continues to have bilateral shoulder pain. He denies any bleeding. No shortness of breath or chest pain. No leg swelling or calf pain. Last week he had a few abdominal pains. He continues to have a good appetite.  Objective:  Vital signs in last 24 hours:  Blood pressure 155/95, pulse 88, temperature 98.6 F (37 C), temperature source Oral, resp. rate 18, height $RemoveBe'5\' 9"'wtuDPbVuB$  (1.753 m), weight 169 lb 6.4 oz (76.839 kg).    HEENT: No thrush or ulceration. Resp: Lungs clear. Cardio: Regular cardiac rhythm. GI: Abdomen soft and nontender. No hepatomegaly. Vascular: No leg edema.   Port-A-Cath site without erythema.    Lab Results:  Lab Results  Component Value Date   WBC 6.0 07/03/2014   HGB 13.2 07/03/2014   HCT 39.8 07/03/2014   MCV 105.2* 07/03/2014   PLT 195 07/03/2014   NEUTROABS 4.5 07/03/2014    Imaging:  No results found.  Medications: I have reviewed the patient's current medications.  Assessment/Plan: 1. Metastatic rectal cancer status post biopsy of rectal mass 09/03/2011 with pathology showing invasive adenocarcinoma. Staging CT scans 09/03/2011 showed a 10 mm hypodense rounded lesion in left lateral hepatic lobe, similar 6 mm lesion in the superior right hepatic lobe and a smaller subcapsular lesion in the lateral right hepatic lobe; rectal mass within the lumen of the bowel emanating from the left wall measuring 4.9 x 3.5 cm and a mass within the low left perirectal fat measuring 2.3 x 3.2 cm consistent with local extension  of carcinoma. Staging PET scan 09/18/2011 showed a 5.1 cm distal sigmoid/rectal mass with max SUV 19.2 with local extension into the left perirectal fat with max SUV 7.7. At least 2 suspected hepatic metastases with max SUV 6.7 in the lateral segment left hepatic lobe and max SUV 4.1 in the lateral right hepatic dome. Additional tiny hypodensities in the liver may be cysts; patchy/nodular opacity in the left lower lobe with max SUV 3.5 worrisome for pulmonary metastasis, less likely infectious. He began radiation and concurrent Xeloda chemotherapy on 09/29/2011. He completed radiation on 11/06/2011. MRI of the liver on 12/08/2011 showed 2 hypermetabolic liver lesions, similar in size to 09/03/2011. 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 Equivocal 7 mm lesion in the inferior right hepatic lobe. No evidence of extrahepatic metastasis within the abdomen. An ultrasound-guided biopsy of a small left hepatic lesion on 12/23/2011 was nondiagnostic. He underwent a low anterior resection with loop ileostomy on 02/06/2012 with final pathology showing a 3.5 cm invasive adenocarcinoma with abundant extracellular mucin invading through the muscularis propria into pericolonic fatty tissue; 4 of 10 pericolonic lymph nodes were positive for metastatic carcinoma; resection margins were clear (ypT3,ypN2). He began FOLFOX chemotherapy on 03/16/2012. He completed 2 cycles of FOLFOX chemotherapy and underwent an ileostomy reversal 05/05/2012. FOLFOX was resumed on 06/14/2012 and completed on 09/14/2012. 2. CEA 13.8 on 09/17/2011; 1.6 on 12/01/2011; 8.4 on 03/09/2012, 2.1 on 05/03/2012; 2.5 on 07/26/2012; 1.8 on 10/12/2012. 3.0 on 11/23/2012. CEA elevated  at 25.4 on 02/22/2013, 44.7 on 04/04/2013, and 59.1 on 05/17/2013. CEA improved at 16.8 on 08/10/2013 and 10.8 on 09/23/2013. CEA improved at 8.9 on 10/18/2013. 3. CT chest/abdomen/pelvis 03/10/2013 with 2 new left lower  lobe nodules and 2 liver lesions increased in size. Progressive metastatic disease in the lungs and liver on a restaging CT 06/07/2013. He began treatment with FOLFIRI/Avastin on 06/21/2013. Restaging CT 09/23/2013 after 5 cycles of FOLFIRI/Avastin, with stable lung lesions and a decrease in the size of dominant liver lesions.  Cycle 6 FOLFIRI/Avastin 09/27/2013.  Cycle 7 FOLFIRI/Avastin 10/18/2013.  Cycle 8 FOLFIRI/Avastin 11/09/2013.  Cycle 9 FOLFIRI/Avastin 12/13/2013.  Cycle 10 FOLFIRI/Avastin 01/03/2014.  Cycle 11 FOLFIRI/Avastin 01/30/2014.  Restaging CT 02/13/2014-no significant change in multiple lung/liver lesions.  Cycle 12 FOLFIRI/Avastin 02/20/2014.  Cycle 13 FOLFIRI/Avastin 03/13/2014.  Cycle 14 FOLFIRI/Avastin 04/03/2014  Cycle 15 FOLFIRI/Avastin 04/24/2014  Cycle 16 FOLFIRI/Avastin 05/16/2014.  Restaging CT evaluation 05/30/2014. No significant change in pulmonary lesions. No significant change in hepatic metastasis. Cycle 17 5-FU/leucovorin/Avastin (irinotecan eliminated from the regimen) 06/12/2014. 4. K-ras mutation detected. 5. Rectal pain and bleeding secondary to #1. Resolved. 6. Frequent loose stools secondary to #1. Improved. 7. Hypertension. 8. Tobacco use. 9. CT scan 09/03/2011 with chronic right hydronephrosis secondary to an obstructing calculus in the mid right ureter. He is followed by Dr. Janice Norrie. Stable on the CT 06/07/2013 10. Status post cystoscopy, right retrograde pyelogram, ureteroscopy and insertion of right double-J stent 11/28/2011. The double-J stent was replaced 02/06/2012. The stent has been removed. 11. Elevated BUN/creatinine. Likely secondary to dehydration while the ileostomy was in place. He now appears to have chronic renal insufficiency. 12. Status post ultrasound-guided fine-needle aspiration of small left hepatic lesion 12/22/2011. The pathology revealed no malignancy. 13. Malaise and weight loss. Resolved. 14. High output ileostomy. Partially  improved with Imodium. Status post ileostomy reversal 05/05/2012. 15. Erectile dysfunction. He is followed by Dr. Janice Norrie. 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 17. Diarrhea after FOLFOX chemotherapy given on 07/26/2012-resolved. The bolus and infusional 5-FU were dose reduced by 20% beginning with FOLFOX on 08/17/2012.  18. History of an Irregular heart rate- Status post evaluation by Dr. Sarajane Jews on 11/02/2012 with findings of sinus rhythm with occasional PACs. 19. Diarrhea following FOLFIRI/Avastin. He reports the diarrhea is relieved with Lomotil. The diarrhea has markedly improved coinciding with discontinuation of  Irinotecan. 20. "Sinus congestion". He is status post evaluation by ENT. Symptoms are better. 21. Hypokalemia 07/05/2013. He continues a potassium supplement.  22. Intermittent bilateral shoulder pain. Question arthritis. He takes tramadol as needed. 23. 24-hour urine 06/07/2014 with 702 mg of protein.   Disposition: [name] appears stable. He has completed 17 cycles of systemic therapy. Plan to proceed with cycle 18 today as scheduled. He will return in 3 weeks for cycle 19. We will see him in followup in 6 weeks. He will contact the office in the interim with any problems.  Plan reviewed with Dr. Benay Spice.    Ned Card ANP/GNP-BC   07/03/2014  10:33 AM

## 2014-07-03 NOTE — Telephone Encounter (Signed)
gv and printed appt sched adn avs for pt for July thru Sept....sed added tx.

## 2014-07-05 ENCOUNTER — Ambulatory Visit (HOSPITAL_BASED_OUTPATIENT_CLINIC_OR_DEPARTMENT_OTHER): Payer: Medicare Other

## 2014-07-05 VITALS — BP 159/80 | HR 91 | Temp 98.5°F | Resp 20

## 2014-07-05 DIAGNOSIS — Z452 Encounter for adjustment and management of vascular access device: Secondary | ICD-10-CM

## 2014-07-05 DIAGNOSIS — C20 Malignant neoplasm of rectum: Secondary | ICD-10-CM

## 2014-07-05 MED ORDER — SODIUM CHLORIDE 0.9 % IJ SOLN
10.0000 mL | INTRAMUSCULAR | Status: DC | PRN
  Administered 2014-07-05: 10 mL
  Filled 2014-07-05: qty 10

## 2014-07-05 MED ORDER — HEPARIN SOD (PORK) LOCK FLUSH 100 UNIT/ML IV SOLN
500.0000 [IU] | Freq: Once | INTRAVENOUS | Status: AC | PRN
  Administered 2014-07-05: 500 [IU]
  Filled 2014-07-05: qty 5

## 2014-07-06 NOTE — Telephone Encounter (Signed)
Pt has been scheduled.  °

## 2014-07-12 ENCOUNTER — Ambulatory Visit: Payer: Medicare Other | Admitting: Family Medicine

## 2014-07-14 ENCOUNTER — Ambulatory Visit (INDEPENDENT_AMBULATORY_CARE_PROVIDER_SITE_OTHER): Payer: Medicare Other | Admitting: Family Medicine

## 2014-07-14 ENCOUNTER — Encounter: Payer: Self-pay | Admitting: Family Medicine

## 2014-07-14 VITALS — BP 170/90 | HR 90 | Temp 97.9°F | Wt 170.0 lb

## 2014-07-14 DIAGNOSIS — I10 Essential (primary) hypertension: Secondary | ICD-10-CM

## 2014-07-14 DIAGNOSIS — C20 Malignant neoplasm of rectum: Secondary | ICD-10-CM

## 2014-07-14 DIAGNOSIS — F17200 Nicotine dependence, unspecified, uncomplicated: Secondary | ICD-10-CM

## 2014-07-14 DIAGNOSIS — N183 Chronic kidney disease, stage 3 unspecified: Secondary | ICD-10-CM

## 2014-07-14 DIAGNOSIS — N2889 Other specified disorders of kidney and ureter: Secondary | ICD-10-CM

## 2014-07-14 DIAGNOSIS — E876 Hypokalemia: Secondary | ICD-10-CM | POA: Insufficient documentation

## 2014-07-14 MED ORDER — TERAZOSIN HCL 5 MG PO CAPS: 5.0000 mg | ORAL_CAPSULE | Freq: Every day | ORAL | Status: AC

## 2014-07-14 NOTE — Assessment & Plan Note (Signed)
Poorly controlled. Patient did not take medication today and admits to several missed doses. I stressed importance of daily use. He is going to try to take them in the morning to increase compliance. Discussed we needed tight control of blood pressure due to his CKD. He is going to long BP x 1 week and get in contact with me with regular use of medication. Can't use HCTZ due to hypokalemia. Tentative to add lisinopril given abnormal anatomy. Consider beta blocker vs. recs from [facility].

## 2014-07-14 NOTE — Patient Instructions (Addendum)
Blood pressure  High today  Start walking again as this has helped in the past  Please take medication each morning  Check your blood pressure mid to late morning  Send me a list of your blood pressures in about a week  We discussed that your blood pressure running high and missing medications can cause further damage to your kidneys and the importance of taking them on a regular basis.

## 2014-07-14 NOTE — Progress Notes (Signed)
[name] Reddish, MD Phone: [phone]  Subjective:  Patient presents today to establish care. Chief complaint-noted.   Hypertension BP Readings from Last 3 Encounters:  07/14/14 170/90  07/05/14 159/80  07/03/14 187/92  Home BP monitoring- states has been up over last week or so up to 180 Compliant with medications-yes without side effects, but missed dose today.  ROS-Denies any CP,  blurry vision, LE edema. Mild headache  CKD Stage III Was supposed to meet with [facility] last winter during snowstorm and has not rescheduled. Last Cr 1.6. History of hydroneprhosis and right hydroureter and patient reports 18% function of right kidney.  ROS-denies decreased urine output  Rectal Cancer Patient currently undergoing chemotherapy. He has metastases in lung and liver. Used to have colostomy bag but has had follow up reversal.  ROS- endorses fatigue, no fever/chills  Tobacco abuse 1 ppd. Does not use inhaler. History does describe COPD.  ROS- occasional shortness of breath    The following were reviewed and entered/updated in epic: Past Medical History  Diagnosis Date  . Hypertension   . Abnormal EKG 11/01/11    REPORT IN EPIC-PT STATES NO KNOWN HEART PROBLEMS-NO CHEST PAINS  . 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 Low blood potassium     POTASSIUM WAS 2.1 ON 11/01/11 VISIT TO ER--PT PUT ON ORAL POTASSIUM--MOST RECENT LAB 11/24/11 POTASSIUM IMPROVED TO 3.1  . History of radiation therapy 09/29/11-11/07/11    rectal ca  . COPD (chronic obstructive pulmonary disease)     prn inhaler  . Heart murmur     SINCE BIRTH--DOES NOT CAUSE ANY PROBLEMS  . Dyspnea     no recent bronchitis  . Recurrent upper respiratory infection (URI)     HEAD COLD   . Sleep apnea     stop-bang score =6 (NO SLEEP STUDY DONE YET)  . No pertinent past medical history     2 SPOTS ON LIVER JUST WATCHING  . Hydronephrosis of right kidney     "Chronic" -SECONDARY TO CALCULUS (STENT RT URETER) MID RT  URETER AND ADDITIONAL CALCULI WITHIN RT KIDNEY--PT STATES HE HAS PASSED A STONE IN THE PAST--DID NOT KNOW HE HAD MORE STONES UNTIL CT ABD DONE ON 09/03/11  . Hydroureter, right   . Nephrolithiasis   . Rectal cancer     HAS COMPLETED RADIATION AND XELODA--PLAN RESTAGING TESTING.  DR. Benay Spice AND DR. MANNING AT [facility]  . Cancer     rectal adenocarcinoma stage [id number]   Patient Active Problem List   Diagnosis Date Noted  . Rectal cancer, approx. 11 cm from anal verge, s/p LAR and loop ileostomy 02/06/12 09/08/2011    Priority: High  . CIGARETTE SMOKER 01/07/2010    Priority: High  . Chronic renal insufficiency, stage III (moderate) 04/18/2013    Priority: Medium  . Hydronephrosis of right kidney 10/06/2011    Priority: Medium  . HYPERSOMNIA, ASSOCIATED WITH SLEEP APNEA 06/27/2010    Priority: Medium  . HYPERTENSION 07/09/2007    Priority: Medium  . Lung metastases 04/18/2013    Priority: Low  . Liver masses, seen on CT scan 09/08/2011    Priority: Low  . Chronic frontal sinusitis 06/27/2010    Priority: Low  . Internal hemorrhoids with other complication 19/50/9326    Priority: Low   Past Surgical History  Procedure Laterality Date  . Wrist and hand surgery  yrs ago    LEFT WRIST AND RIGHT HAND--GANGLION LW  AND FX OF RT HAS  .  Right hand surgery -repair fracture  yrs ago  . Removal ganglion cyst  yrs ago  . Cystoscopy with stent placment  jan 2013  . Appendectomy  02/06/2012    Procedure: APPENDECTOMY;  Surgeon: Shann Medal, MD;  Location: [facility];  Service: General;;  . Laparotomy  02/06/2012    Procedure: EXPLORATORY LAPAROTOMY;  Surgeon: Shann Medal, MD;  Location: [facility];  Service: General;;  . Ureterolithotomy  02/06/2012    Procedure: URETEROLITHOTOMY;  Surgeon: Hanley Ben, MD;  Location: [facility];  Service: Urology;  Laterality: Right;  open exploration of right ureter, double J stent exchange, attempted ureteroscopy  . Colostomy    . Portacath  placement  03/10/2012    Procedure: INSERTION PORT-A-CATH;  Surgeon: Shann Medal, MD;  Location: [facility];  Service: General;  Laterality: N/A;  . Colon surgery    . Ileo loop colostomy closure  05/05/2012    Procedure: LAPAROSCOPIC ILEO LOOP COLOSTOMY CLOSURE;  Surgeon: Shann Medal, MD;  Location: [facility];  Service: General;  Laterality: N/A;  Laparoscopic Assisted Ileostomy reversal  . Metastatic recatal cnacer      liver and lung    Family History  Problem Relation Age of Onset  . Lupus    . Anemia Mother   . Hypertension Father   . Kidney disease Father 63    renal failure  . Lupus Sister   . Lupus Brother   . Leukemia Mother     age 18    Medications- reviewed and updated Current Outpatient Prescriptions  Medication Sig Dispense Refill  . amLODipine (NORVASC) 10 MG tablet Take 1 tablet (10 mg total) by mouth daily.  90 tablet  3  . potassium chloride SA (KLOR-CON M20) 20 MEQ tablet Take 1 tablet (20 mEq total) by mouth daily.  90 tablet  3  . terazosin (HYTRIN) 5 MG capsule Take 1 capsule (5 mg total) by mouth at bedtime.  30 capsule  prn  . traMADol (ULTRAM) 50 MG tablet Take 1 tablet (50 mg total) by mouth every 12 (twelve) hours as needed.  40 tablet  0  . diphenoxylate-atropine (LOMOTIL) 2.5-0.025 MG per tablet Take 1 tablet by mouth 4 (four) times daily as needed for diarrhea or loose stools.  120 tablet  1  . fluticasone (FLONASE) 50 MCG/ACT nasal spray Place 1 spray into both nostrils daily.       . pantoprazole (PROTONIX) 40 MG tablet Take 1 tablet (40 mg total) by mouth 2 (two) times daily.  60 tablet  2  . prochlorperazine (COMPAZINE) 10 MG tablet Take 10 mg by mouth every 6 (six) hours as needed for nausea or vomiting.       No current facility-administered medications for this visit.    Allergies-reviewed and updated No Known Allergies  History   Social History  . Marital Status: Married    Spouse Name: N/A    Number of Children: N/A  . Years of  Education: N/A   Social History Main Topics  . Smoking status: Current Every Day Smoker -- 1.00 packs/day for 46 years    Types: Cigarettes  . Smokeless tobacco: Never Used  . Alcohol Use: No  . Drug Use: No  . Sexual Activity: Yes   Other Topics Concern  . None   Social History Narrative   Rectal cancer in 2012, in chemotherapy now, has had 2 surgeries (at [facility])      [place] whole life. Married since 1974. 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       Hobbies: tv (westerns)    ROS--See HPI   Objective: BP 170/90  Pulse 90  Temp(Src) 97.9 F (36.6 C)  Wt 170 lb (77.111 kg) Gen: NAD, resting comfortably on table Neck: no thyromegaly CV: RRR no murmurs rubs or gallops Lungs: CTAB no crackles, wheeze, rhonchi Abdomen: soft/nontender/nondistended/normal bowel sounds.  Ext: no edema Skin: warm, dry, no rash Neuro: PERRLA  Assessment/Plan:  CIGARETTE SMOKER Advised to quit. Not ready.   Rectal cancer, approx. 11 cm from anal verge, s/p LAR and loop ileostomy 02/06/12 Followed by oncology and getting chemotherapy. Offered support for patient. He is discouraged he is not able to do everything he used to be able to do.   Chronic renal insufficiency, stage III (moderate) Stable. Discussed with patient really need to control blood pressure. Options are limited due to CKD. Patient admits has not followed up with [facility] and tells me he will schedule this follow up.   HYPERTENSION Poorly controlled. Patient did not take medication today and admits to several missed doses. I stressed importance of daily use. He is going to try to take them in the morning to increase compliance. Discussed we needed tight control of blood pressure due to his CKD. He is going to long BP x 1 week and get in contact with me with regular use of medication. Can't use HCTZ due to hypokalemia. Tentative to add lisinopril given abnormal  anatomy. Consider beta blocker vs. recs from [facility].    Meds ordered this encounter  Medications  . terazosin (HYTRIN) 5 MG capsule    Sig: Take 1 capsule (5 mg total) by mouth at bedtime.    Dispense:  30 capsule    Refill:  prn

## 2014-07-14 NOTE — Assessment & Plan Note (Addendum)
Stable. Discussed with patient really need to control blood pressure. Options are limited due to CKD. Patient admits has not followed up with [facility] and tells me he will schedule this follow up.

## 2014-07-14 NOTE — Assessment & Plan Note (Signed)
Advised to quit. Not ready.

## 2014-07-14 NOTE — Assessment & Plan Note (Addendum)
Followed by oncology and getting chemotherapy. Offered support for patient. He is discouraged he is not able to do everything he used to be able to do.

## 2014-07-23 ENCOUNTER — Other Ambulatory Visit: Payer: Self-pay | Admitting: Oncology

## 2014-07-25 ENCOUNTER — Ambulatory Visit (HOSPITAL_BASED_OUTPATIENT_CLINIC_OR_DEPARTMENT_OTHER): Payer: Medicare Other

## 2014-07-25 ENCOUNTER — Other Ambulatory Visit (HOSPITAL_BASED_OUTPATIENT_CLINIC_OR_DEPARTMENT_OTHER): Payer: Medicare Other

## 2014-07-25 VITALS — BP 143/81 | HR 89 | Temp 98.2°F | Resp 16

## 2014-07-25 DIAGNOSIS — C20 Malignant neoplasm of rectum: Secondary | ICD-10-CM

## 2014-07-25 DIAGNOSIS — Z5111 Encounter for antineoplastic chemotherapy: Secondary | ICD-10-CM

## 2014-07-25 DIAGNOSIS — C7800 Secondary malignant neoplasm of unspecified lung: Secondary | ICD-10-CM

## 2014-07-25 DIAGNOSIS — C787 Secondary malignant neoplasm of liver and intrahepatic bile duct: Secondary | ICD-10-CM

## 2014-07-25 LAB — COMPREHENSIVE METABOLIC PANEL (CC13)
ALT: 12 U/L (ref 0–55)
ANION GAP: 10 meq/L (ref 3–11)
AST: 18 U/L (ref 5–34)
Albumin: 3.2 g/dL — ABNORMAL LOW (ref 3.5–5.0)
Alkaline Phosphatase: 120 U/L (ref 40–150)
BUN: 21.3 mg/dL (ref 7.0–26.0)
CALCIUM: 8.8 mg/dL (ref 8.4–10.4)
CHLORIDE: 109 meq/L (ref 98–109)
CO2: 25 meq/L (ref 22–29)
Creatinine: 1.4 mg/dL — ABNORMAL HIGH (ref 0.7–1.3)
Glucose: 141 mg/dl — ABNORMAL HIGH (ref 70–140)
Potassium: 3.5 mEq/L (ref 3.5–5.1)
Sodium: 143 mEq/L (ref 136–145)
Total Bilirubin: 0.61 mg/dL (ref 0.20–1.20)
Total Protein: 6.7 g/dL (ref 6.4–8.3)

## 2014-07-25 LAB — CBC WITH DIFFERENTIAL/PLATELET
BASO%: 0.5 % (ref 0.0–2.0)
BASOS ABS: 0 10*3/uL (ref 0.0–0.1)
EOS ABS: 0.1 10*3/uL (ref 0.0–0.5)
EOS%: 1.6 % (ref 0.0–7.0)
HEMATOCRIT: 40.3 % (ref 38.4–49.9)
HEMOGLOBIN: 13.4 g/dL (ref 13.0–17.1)
LYMPH#: 0.9 10*3/uL (ref 0.9–3.3)
LYMPH%: 19.1 % (ref 14.0–49.0)
MCH: 35.1 pg — ABNORMAL HIGH (ref 27.2–33.4)
MCHC: 33.2 g/dL (ref 32.0–36.0)
MCV: 105.9 fL — ABNORMAL HIGH (ref 79.3–98.0)
MONO#: 0.3 10*3/uL (ref 0.1–0.9)
MONO%: 6.6 % (ref 0.0–14.0)
NEUT%: 72.2 % (ref 39.0–75.0)
NEUTROS ABS: 3.6 10*3/uL (ref 1.5–6.5)
Platelets: 172 10*3/uL (ref 140–400)
RBC: 3.8 10*6/uL — ABNORMAL LOW (ref 4.20–5.82)
RDW: 15.1 % — ABNORMAL HIGH (ref 11.0–14.6)
WBC: 4.9 10*3/uL (ref 4.0–10.3)

## 2014-07-25 LAB — CEA: CEA: 27.8 ng/mL — AB (ref 0.0–5.0)

## 2014-07-25 LAB — UA PROTEIN, DIPSTICK - CHCC: Protein, ur: 300 mg/dL

## 2014-07-25 MED ORDER — LEUCOVORIN CALCIUM INJECTION 350 MG
320.0000 mg/m2 | Freq: Once | INTRAMUSCULAR | Status: AC
  Administered 2014-07-25: 646 mg via INTRAVENOUS
  Filled 2014-07-25: qty 32.3

## 2014-07-25 MED ORDER — SODIUM CHLORIDE 0.9 % IV SOLN
1920.0000 mg/m2 | INTRAVENOUS | Status: DC
  Administered 2014-07-25: 3900 mg via INTRAVENOUS
  Filled 2014-07-25: qty 78

## 2014-07-25 MED ORDER — FLUOROURACIL CHEMO INJECTION 2.5 GM/50ML
320.0000 mg/m2 | Freq: Once | INTRAVENOUS | Status: AC
  Administered 2014-07-25: 650 mg via INTRAVENOUS
  Filled 2014-07-25: qty 13

## 2014-07-25 MED ORDER — SODIUM CHLORIDE 0.9 % IV SOLN
Freq: Once | INTRAVENOUS | Status: AC
  Administered 2014-07-25: 10:00:00 via INTRAVENOUS

## 2014-07-25 NOTE — Patient Instructions (Addendum)
[facility] Discharge Instructions for Patients Receiving Chemotherapy  Today you received the following chemotherapy agents 5FU, Leucovorin, 5FU infusion pump.  To help prevent nausea and vomiting after your treatment, we encourage you to take your nausea medication Compazine 10mg  every 6 hours as needed.   If you develop nausea and vomiting that is not controlled by your nausea medication, call the clinic.   BELOW ARE SYMPTOMS THAT SHOULD BE REPORTED IMMEDIATELY:  *FEVER GREATER THAN 100.5 F  *CHILLS WITH OR WITHOUT FEVER  NAUSEA AND VOMITING THAT IS NOT CONTROLLED WITH YOUR NAUSEA MEDICATION  *UNUSUAL SHORTNESS OF BREATH  *UNUSUAL BRUISING OR BLEEDING  TENDERNESS IN MOUTH AND THROAT WITH OR WITHOUT PRESENCE OF ULCERS  *URINARY PROBLEMS  *BOWEL PROBLEMS  UNUSUAL RASH Items with * indicate a potential emergency and should be followed up as soon as possible.  Feel free to call the clinic you have any questions or concerns. The clinic phone number is [phone].

## 2014-07-25 NOTE — Progress Notes (Signed)
Hold Avastin per Dr Benay Spice.  Protein 300

## 2014-07-27 ENCOUNTER — Ambulatory Visit (HOSPITAL_BASED_OUTPATIENT_CLINIC_OR_DEPARTMENT_OTHER): Payer: Medicare Other

## 2014-07-27 DIAGNOSIS — C20 Malignant neoplasm of rectum: Secondary | ICD-10-CM

## 2014-07-27 DIAGNOSIS — Z452 Encounter for adjustment and management of vascular access device: Secondary | ICD-10-CM

## 2014-07-27 MED ORDER — HEPARIN SOD (PORK) LOCK FLUSH 100 UNIT/ML IV SOLN
500.0000 [IU] | Freq: Once | INTRAVENOUS | Status: AC | PRN
  Administered 2014-07-27: 500 [IU]
  Filled 2014-07-27: qty 5

## 2014-07-27 MED ORDER — SODIUM CHLORIDE 0.9 % IJ SOLN
10.0000 mL | INTRAMUSCULAR | Status: DC | PRN
  Administered 2014-07-27: 10 mL
  Filled 2014-07-27: qty 10

## 2014-07-28 ENCOUNTER — Telehealth: Payer: Self-pay | Admitting: Family Medicine

## 2014-07-28 MED ORDER — AMLODIPINE BESYLATE 10 MG PO TABS: 10.0000 mg | ORAL_TABLET | Freq: Every day | ORAL | Status: DC

## 2014-07-28 NOTE — Telephone Encounter (Signed)
Pt request refill of the following:  amLODipine (NORVASC) 10 MG tablet   Pt also said his bp has been running  around  140/81    Phamacy:  [facility] [address]

## 2014-07-28 NOTE — Telephone Encounter (Signed)
Medication sent in. 

## 2014-07-31 ENCOUNTER — Encounter (HOSPITAL_COMMUNITY): Payer: Self-pay

## 2014-08-01 NOTE — Telephone Encounter (Signed)
Done

## 2014-08-14 ENCOUNTER — Other Ambulatory Visit: Payer: Self-pay | Admitting: Oncology

## 2014-08-15 ENCOUNTER — Ambulatory Visit (HOSPITAL_BASED_OUTPATIENT_CLINIC_OR_DEPARTMENT_OTHER): Payer: Medicare Other

## 2014-08-15 ENCOUNTER — Ambulatory Visit (HOSPITAL_BASED_OUTPATIENT_CLINIC_OR_DEPARTMENT_OTHER): Payer: Medicare Other | Admitting: Oncology

## 2014-08-15 ENCOUNTER — Telehealth: Payer: Self-pay | Admitting: Oncology

## 2014-08-15 ENCOUNTER — Other Ambulatory Visit (HOSPITAL_BASED_OUTPATIENT_CLINIC_OR_DEPARTMENT_OTHER): Payer: Medicare Other

## 2014-08-15 VITALS — BP 147/73 | HR 85 | Temp 98.7°F | Resp 18 | Ht 69.0 in | Wt 170.7 lb

## 2014-08-15 DIAGNOSIS — C20 Malignant neoplasm of rectum: Secondary | ICD-10-CM

## 2014-08-15 DIAGNOSIS — Z85048 Personal history of other malignant neoplasm of rectum, rectosigmoid junction, and anus: Secondary | ICD-10-CM

## 2014-08-15 DIAGNOSIS — C7800 Secondary malignant neoplasm of unspecified lung: Secondary | ICD-10-CM

## 2014-08-15 DIAGNOSIS — R809 Proteinuria, unspecified: Secondary | ICD-10-CM

## 2014-08-15 DIAGNOSIS — Z452 Encounter for adjustment and management of vascular access device: Secondary | ICD-10-CM

## 2014-08-15 DIAGNOSIS — C787 Secondary malignant neoplasm of liver and intrahepatic bile duct: Secondary | ICD-10-CM

## 2014-08-15 DIAGNOSIS — R978 Other abnormal tumor markers: Secondary | ICD-10-CM

## 2014-08-15 DIAGNOSIS — Z95828 Presence of other vascular implants and grafts: Secondary | ICD-10-CM

## 2014-08-15 DIAGNOSIS — M25519 Pain in unspecified shoulder: Secondary | ICD-10-CM

## 2014-08-15 DIAGNOSIS — Z5111 Encounter for antineoplastic chemotherapy: Secondary | ICD-10-CM

## 2014-08-15 LAB — CBC WITH DIFFERENTIAL/PLATELET
BASO%: 0.2 % (ref 0.0–2.0)
Basophils Absolute: 0 10*3/uL (ref 0.0–0.1)
EOS ABS: 0.1 10*3/uL (ref 0.0–0.5)
EOS%: 2 % (ref 0.0–7.0)
HCT: 39.2 % (ref 38.4–49.9)
HGB: 13.3 g/dL (ref 13.0–17.1)
LYMPH%: 15.6 % (ref 14.0–49.0)
MCH: 35 pg — ABNORMAL HIGH (ref 27.2–33.4)
MCHC: 33.9 g/dL (ref 32.0–36.0)
MCV: 103.2 fL — ABNORMAL HIGH (ref 79.3–98.0)
MONO#: 0.5 10*3/uL (ref 0.1–0.9)
MONO%: 8.8 % (ref 0.0–14.0)
NEUT#: 4.4 10*3/uL (ref 1.5–6.5)
NEUT%: 73.4 % (ref 39.0–75.0)
PLATELETS: 173 10*3/uL (ref 140–400)
RBC: 3.8 10*6/uL — ABNORMAL LOW (ref 4.20–5.82)
RDW: 14.1 % (ref 11.0–14.6)
WBC: 6 10*3/uL (ref 4.0–10.3)
lymph#: 0.9 10*3/uL (ref 0.9–3.3)

## 2014-08-15 LAB — COMPREHENSIVE METABOLIC PANEL (CC13)
ALT: 11 U/L (ref 0–55)
AST: 17 U/L (ref 5–34)
Albumin: 3 g/dL — ABNORMAL LOW (ref 3.5–5.0)
Alkaline Phosphatase: 131 U/L (ref 40–150)
Anion Gap: 11 mEq/L (ref 3–11)
BUN: 25.1 mg/dL (ref 7.0–26.0)
CO2: 22 mEq/L (ref 22–29)
CREATININE: 1.4 mg/dL — AB (ref 0.7–1.3)
Calcium: 8.6 mg/dL (ref 8.4–10.4)
Chloride: 108 mEq/L (ref 98–109)
Glucose: 139 mg/dl (ref 70–140)
Potassium: 3.5 mEq/L (ref 3.5–5.1)
Sodium: 141 mEq/L (ref 136–145)
Total Bilirubin: 0.49 mg/dL (ref 0.20–1.20)
Total Protein: 6.6 g/dL (ref 6.4–8.3)

## 2014-08-15 LAB — URINALYSIS, MICROSCOPIC - CHCC
Bilirubin (Urine): NEGATIVE
Blood: NEGATIVE
Glucose: NEGATIVE mg/dL
Ketones: NEGATIVE mg/dL
Nitrite: NEGATIVE
Protein: 2000 mg/dL
RBC / HPF: NEGATIVE (ref 0–2)
Specific Gravity, Urine: 1.015 (ref 1.003–1.035)
UROBILINOGEN UR: 1 mg/dL (ref 0.2–1)
pH: 6.5 (ref 4.6–8.0)

## 2014-08-15 LAB — CEA: CEA: 35.9 ng/mL — ABNORMAL HIGH (ref 0.0–5.0)

## 2014-08-15 LAB — UA PROTEIN, DIPSTICK - CHCC: Protein, ur: 300 mg/dL

## 2014-08-15 MED ORDER — SODIUM CHLORIDE 0.9 % IV SOLN
1920.0000 mg/m2 | INTRAVENOUS | Status: DC
  Administered 2014-08-15: 3900 mg via INTRAVENOUS
  Filled 2014-08-15: qty 78

## 2014-08-15 MED ORDER — SODIUM CHLORIDE 0.9 % IJ SOLN
10.0000 mL | INTRAMUSCULAR | Status: DC | PRN
  Administered 2014-08-15: 10 mL via INTRAVENOUS
  Filled 2014-08-15: qty 10

## 2014-08-15 MED ORDER — SODIUM CHLORIDE 0.9 % IV SOLN: 5.0000 mg/kg | Freq: Once | INTRAVENOUS | Status: DC

## 2014-08-15 MED ORDER — SODIUM CHLORIDE 0.9 % IV SOLN
Freq: Once | INTRAVENOUS | Status: AC
  Administered 2014-08-15: 10:00:00 via INTRAVENOUS

## 2014-08-15 MED ORDER — FLUOROURACIL CHEMO INJECTION 2.5 GM/50ML
320.0000 mg/m2 | Freq: Once | INTRAVENOUS | Status: AC
  Administered 2014-08-15: 650 mg via INTRAVENOUS
  Filled 2014-08-15: qty 13

## 2014-08-15 MED ORDER — LEUCOVORIN CALCIUM INJECTION 350 MG
320.0000 mg/m2 | Freq: Once | INTRAVENOUS | Status: AC
  Administered 2014-08-15: 646 mg via INTRAVENOUS
  Filled 2014-08-15: qty 32.3

## 2014-08-15 NOTE — Telephone Encounter (Signed)
gv and printed appt sched and avs for pt for Sept and OCT...sed added tx.

## 2014-08-15 NOTE — Progress Notes (Signed)
0955-Hold Avastin today and send urine for u/a per Dr. Benay Spice.

## 2014-08-15 NOTE — Patient Instructions (Signed)
[facility] Discharge Instructions for Patients Receiving Chemotherapy  Today you received the following chemotherapy agents:  Leucovorin and 5FU.  To help prevent nausea and vomiting after your treatment, we encourage you to take your nausea medication as ordered per MD.   If you develop nausea and vomiting that is not controlled by your nausea medication, call the clinic.   BELOW ARE SYMPTOMS THAT SHOULD BE REPORTED IMMEDIATELY:  *FEVER GREATER THAN 100.5 F  *CHILLS WITH OR WITHOUT FEVER  NAUSEA AND VOMITING THAT IS NOT CONTROLLED WITH YOUR NAUSEA MEDICATION  *UNUSUAL SHORTNESS OF BREATH  *UNUSUAL BRUISING OR BLEEDING  TENDERNESS IN MOUTH AND THROAT WITH OR WITHOUT PRESENCE OF ULCERS  *URINARY PROBLEMS  *BOWEL PROBLEMS  UNUSUAL RASH Items with * indicate a potential emergency and should be followed up as soon as possible.  Feel free to call the clinic you have any questions or concerns. The clinic phone number is [phone].    

## 2014-08-15 NOTE — Patient Instructions (Signed)

## 2014-08-15 NOTE — Progress Notes (Signed)
[facility] OFFICE PROGRESS NOTE   Diagnosis: Rectal cancer  INTERVAL HISTORY:   He returns as scheduled. He completed another cycle of 5-FU and Avastin on 07/25/2014. No significant diarrhea since discontinuing the irinotecan from the chemotherapy regimen. No bleeding or sign of thrombosis. He feels well. He complains of increased pain in the left shoulder.  Objective:  Vital signs in last 24 hours:  Blood pressure 147/73, pulse 85, temperature 98.7 F (37.1 C), temperature source Oral, resp. rate 18, height $RemoveBe'5\' 9"'DSYjypylz$  (1.753 m), weight 170 lb 11.2 oz (77.429 kg).    HEENT: Leukoplakia versus mild thrush at the anterior buccal mucosa bilaterally. No ulcers Resp: Distant breath sounds, clear bilaterally Cardio: Regular rate and rhythm GI: No hepatomegaly, nontender Vascular: No leg edema  Skin: Hyperpigmentation and skin thickening over the hands  Musculoskeletal: Discomfort with movement at the left shoulder  Portacath/PICC-without erythema  Lab Results:  Lab Results  Component Value Date   WBC 6.0 08/15/2014   HGB 13.3 08/15/2014   HCT 39.2 08/15/2014   MCV 103.2* 08/15/2014   PLT 173 08/15/2014   NEUTROABS 4.4 08/15/2014   creatinine 1.4, BUN 21.3  Lab Results  Component Value Date   CEA 27.8* 07/25/2014    Imaging:  No results found.  Medications: I have reviewed the patient's current medications.  Assessment/Plan: 1. Metastatic rectal cancer status post biopsy of rectal mass 09/03/2011 with pathology showing invasive adenocarcinoma. Staging CT scans 09/03/2011 showed a 10 mm hypodense rounded lesion in left lateral hepatic lobe, similar 6 mm lesion in the superior right hepatic lobe and a smaller subcapsular lesion in the lateral right hepatic lobe; rectal mass within the lumen of the bowel emanating from the left wall measuring 4.9 x 3.5 cm and a mass within the low left perirectal fat measuring 2.3 x 3.2 cm consistent with local extension of carcinoma. Staging  PET scan 09/18/2011 showed a 5.1 cm distal sigmoid/rectal mass with max SUV 19.2 with local extension into the left perirectal fat with max SUV 7.7. At least 2 suspected hepatic metastases with max SUV 6.7 in the lateral segment left hepatic lobe and max SUV 4.1 in the lateral right hepatic dome. Additional tiny hypodensities in the liver may be cysts; patchy/nodular opacity in the left lower lobe with max SUV 3.5 worrisome for pulmonary metastasis, less likely infectious. He began radiation and concurrent Xeloda chemotherapy on 09/29/2011. He completed radiation on 11/06/2011. MRI of the liver on 12/08/2011 showed 2 hypermetabolic liver lesions, similar in size to 09/03/2011. 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 Equivocal 7 mm lesion in the inferior right hepatic lobe. No evidence of extrahepatic metastasis within the abdomen. An ultrasound-guided biopsy of a small left hepatic lesion on 12/23/2011 was nondiagnostic. He underwent a low anterior resection with loop ileostomy on 02/06/2012 with final pathology showing a 3.5 cm invasive adenocarcinoma with abundant extracellular mucin invading through the muscularis propria into pericolonic fatty tissue; 4 of 10 pericolonic lymph nodes were positive for metastatic carcinoma; resection margins were clear (ypT3,ypN2). He began FOLFOX chemotherapy on 03/16/2012. He completed 2 cycles of FOLFOX chemotherapy and underwent an ileostomy reversal 05/05/2012. FOLFOX was resumed on 06/14/2012 and completed on 09/14/2012. 2. CEA 13.8 on 09/17/2011; 1.6 on 12/01/2011; 8.4 on 03/09/2012, 2.1 on 05/03/2012; 2.5 on 07/26/2012; 1.8 on 10/12/2012. 3.0 on 11/23/2012. CEA elevated at 25.4 on 02/22/2013, 44.7 on 04/04/2013, and 59.1 on 05/17/2013. CEA improved at 16.8 on 08/10/2013 and 10.8  on 09/23/2013. CEA improved at 8.9 on 10/18/2013. 3. CT chest/abdomen/pelvis 03/10/2013 with 2 new left lower lobe nodules and 2  liver lesions increased in size. Progressive metastatic disease in the lungs and liver on a restaging CT 06/07/2013. He began treatment with FOLFIRI/Avastin on 06/21/2013. Restaging CT 09/23/2013 after 5 cycles of FOLFIRI/Avastin, with stable lung lesions and a decrease in the size of dominant liver lesions.  Cycle 6 FOLFIRI/Avastin 09/27/2013.  Cycle 7 FOLFIRI/Avastin 10/18/2013.  Cycle 8 FOLFIRI/Avastin 11/09/2013.  Cycle 9 FOLFIRI/Avastin 12/13/2013.  Cycle 10 FOLFIRI/Avastin 01/03/2014.  Cycle 11 FOLFIRI/Avastin 01/30/2014.  Restaging CT 02/13/2014-no significant change in multiple lung/liver lesions.  Cycle 12 FOLFIRI/Avastin 02/20/2014.  Cycle 13 FOLFIRI/Avastin 03/13/2014.  Cycle 14 FOLFIRI/Avastin 04/03/2014  Cycle 15 FOLFIRI/Avastin 04/24/2014  Cycle 16 FOLFIRI/Avastin 05/16/2014.  Restaging CT evaluation 05/30/2014. No significant change in pulmonary lesions. No significant change in hepatic metastasis.  Cycle 17 5-FU/leucovorin/Avastin (irinotecan eliminated from the regimen) 06/12/2014. Cycle 18 5-FU/leucovorin/Avastin 07/03/2014  cycle 19 5-FU/leucovorin/Avastin 07/25/2014  4. K-ras mutation detected. 5. Rectal pain and bleeding secondary to #1. Resolved. 6. Frequent loose stools secondary to #1. Improved. 7. Hypertension. 8. Tobacco use. 9. CT scan 09/03/2011 with chronic right hydronephrosis secondary to an obstructing calculus in the mid right ureter. He is followed by Dr. Janice Norrie. Stable on the CT 06/07/2013 10. Status post cystoscopy, right retrograde pyelogram, ureteroscopy and insertion of right double-J stent 11/28/2011. The double-J stent was replaced 02/06/2012. The stent has been removed. 11. Elevated BUN/creatinine. Likely secondary to dehydration while the ileostomy was in place. He now appears to have chronic renal insufficiency. 12. Status post ultrasound-guided fine-needle aspiration of small left hepatic lesion 12/22/2011. The pathology revealed no  malignancy. 13. Malaise and weight loss. Resolved. 14. High output ileostomy. Partially improved with Imodium. Status post ileostomy reversal 05/05/2012. 15. Erectile dysfunction. He is followed by Dr. Janice Norrie. 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 17. Diarrhea after FOLFOX chemotherapy given on 07/26/2012-resolved. The bolus and infusional 5-FU were dose reduced by 20% beginning with FOLFOX on 08/17/2012.  18. History of an Irregular heart rate- Status post evaluation by Dr. Sarajane Jews on 11/02/2012 with findings of sinus rhythm with occasional PACs. 19. Diarrhea following FOLFIRI/Avastin. He reports the diarrhea is relieved with Lomotil. The diarrhea has markedly improved coinciding with discontinuation of Irinotecan. 20. "Sinus congestion". He is status post evaluation by ENT. Symptoms are better. 21. Hypokalemia 07/05/2013. He continues a potassium supplement.  22. Intermittent bilateral shoulder pain.  now with increased pain at the left shoulder requiring frequent tramadol  23. 24-hour urine 06/07/2014 with 702 mg of protein.   Disposition:  Mr. Bougie appears well. The CEA was higher last month. We will check the CEA today. If higher and can we will arrange for a restaging CT evaluation.  We will make a referral to orthopedics to a value with shoulder pain.  Mr. Gruenewald will return for an office visit and chemotherapy in 3 weeks. He will complete another cycle of 5-FU/Avastin today.  Betsy Coder, MD  08/15/2014  8:13 AM

## 2014-08-17 ENCOUNTER — Ambulatory Visit (HOSPITAL_BASED_OUTPATIENT_CLINIC_OR_DEPARTMENT_OTHER): Payer: Medicare Other

## 2014-08-17 ENCOUNTER — Telehealth: Payer: Self-pay | Admitting: *Deleted

## 2014-08-17 DIAGNOSIS — C7800 Secondary malignant neoplasm of unspecified lung: Secondary | ICD-10-CM

## 2014-08-17 DIAGNOSIS — C20 Malignant neoplasm of rectum: Secondary | ICD-10-CM

## 2014-08-17 DIAGNOSIS — Z452 Encounter for adjustment and management of vascular access device: Secondary | ICD-10-CM

## 2014-08-17 MED ORDER — HEPARIN SOD (PORK) LOCK FLUSH 100 UNIT/ML IV SOLN
500.0000 [IU] | Freq: Once | INTRAVENOUS | Status: AC | PRN
  Administered 2014-08-17: 500 [IU]
  Filled 2014-08-17: qty 5

## 2014-08-17 MED ORDER — SODIUM CHLORIDE 0.9 % IJ SOLN
10.0000 mL | INTRAMUSCULAR | Status: DC | PRN
  Administered 2014-08-17: 10 mL
  Filled 2014-08-17: qty 10

## 2014-08-17 NOTE — Patient Instructions (Signed)

## 2014-08-17 NOTE — Telephone Encounter (Signed)
Message copied by Brien Few on Thu Aug 17, 2014 11:37 AM ------      Message from: Betsy Coder B      Created: Wed Aug 16, 2014  5:59 PM       Please call patient, cea is higher, scheduled CTs Chest, Abd/Pelvis few days prior to next visit ------

## 2014-08-18 NOTE — Telephone Encounter (Signed)
Late entry for 08/17/14: Left message on voicemail for pt to call office. Orders entered for CT CAP prior to next visit.

## 2014-08-22 NOTE — Telephone Encounter (Signed)
Left VM for patient to call back and confirm he got message regarding labs and scan. Noted that scan has been scheduled.

## 2014-08-24 ENCOUNTER — Telehealth: Payer: Self-pay | Admitting: Oncology

## 2014-08-24 ENCOUNTER — Telehealth: Payer: Self-pay | Admitting: *Deleted

## 2014-08-24 ENCOUNTER — Other Ambulatory Visit: Payer: Self-pay | Admitting: *Deleted

## 2014-08-24 NOTE — Telephone Encounter (Signed)
Message copied by Domenic Schwab on Thu Aug 24, 2014  3:38 PM ------      Message from: Betsy Coder B      Created: Wed Aug 16, 2014  5:59 PM       Please call patient, cea is higher, scheduled CTs Chest, Abd/Pelvis few days prior to next visit ------

## 2014-08-24 NOTE — Telephone Encounter (Signed)
Per Dr. Benay Spice; notified pt that CEA is higher and CT scan scheduled for 9/25 @3pm .  Pt verbalized understanding but states he has scheduling conflict that day and request to re-schedule CT.  Schedulers notified of request.

## 2014-08-24 NOTE — Telephone Encounter (Signed)
Pt cld about r/s Scan, spk w/pt and he advised he has already r/s apt...KJ

## 2014-08-31 ENCOUNTER — Ambulatory Visit (HOSPITAL_COMMUNITY)
Admission: RE | Admit: 2014-08-31 | Discharge: 2014-08-31 | Disposition: A | Discharge status: Home / Self Care | Payer: Medicare Other | Source: Ambulatory Visit | Attending: Oncology | Admitting: Oncology

## 2014-08-31 ENCOUNTER — Encounter (HOSPITAL_COMMUNITY): Payer: Self-pay

## 2014-08-31 DIAGNOSIS — C20 Malignant neoplasm of rectum: Secondary | ICD-10-CM | POA: Insufficient documentation

## 2014-08-31 DIAGNOSIS — C7800 Secondary malignant neoplasm of unspecified lung: Secondary | ICD-10-CM

## 2014-08-31 DIAGNOSIS — C787 Secondary malignant neoplasm of liver and intrahepatic bile duct: Secondary | ICD-10-CM | POA: Diagnosis not present

## 2014-08-31 DIAGNOSIS — N200 Calculus of kidney: Secondary | ICD-10-CM | POA: Diagnosis not present

## 2014-08-31 MED ORDER — IOHEXOL 300 MG/ML  SOLN
80.0000 mL | Freq: Once | INTRAMUSCULAR | Status: AC | PRN
  Administered 2014-08-31: 80 mL via INTRAVENOUS

## 2014-09-01 ENCOUNTER — Ambulatory Visit (HOSPITAL_COMMUNITY): Payer: Medicare Other

## 2014-09-04 ENCOUNTER — Telehealth: Payer: Self-pay | Admitting: *Deleted

## 2014-09-04 ENCOUNTER — Ambulatory Visit (HOSPITAL_BASED_OUTPATIENT_CLINIC_OR_DEPARTMENT_OTHER): Payer: Medicare Other | Admitting: Nurse Practitioner

## 2014-09-04 ENCOUNTER — Telehealth: Payer: Self-pay | Admitting: Nurse Practitioner

## 2014-09-04 ENCOUNTER — Other Ambulatory Visit (HOSPITAL_BASED_OUTPATIENT_CLINIC_OR_DEPARTMENT_OTHER): Payer: Medicare Other

## 2014-09-04 VITALS — BP 155/87 | HR 87 | Temp 98.4°F | Resp 18 | Ht 69.0 in | Wt 167.6 lb

## 2014-09-04 DIAGNOSIS — C7800 Secondary malignant neoplasm of unspecified lung: Secondary | ICD-10-CM

## 2014-09-04 DIAGNOSIS — C20 Malignant neoplasm of rectum: Secondary | ICD-10-CM

## 2014-09-04 DIAGNOSIS — E876 Hypokalemia: Secondary | ICD-10-CM

## 2014-09-04 DIAGNOSIS — C787 Secondary malignant neoplasm of liver and intrahepatic bile duct: Secondary | ICD-10-CM

## 2014-09-04 LAB — COMPREHENSIVE METABOLIC PANEL (CC13)
ALBUMIN: 3 g/dL — AB (ref 3.5–5.0)
ALK PHOS: 139 U/L (ref 40–150)
ALT: 12 U/L (ref 0–55)
AST: 20 U/L (ref 5–34)
Anion Gap: 10 mEq/L (ref 3–11)
BUN: 23 mg/dL (ref 7.0–26.0)
CO2: 25 mEq/L (ref 22–29)
Calcium: 9.2 mg/dL (ref 8.4–10.4)
Chloride: 109 mEq/L (ref 98–109)
Creatinine: 1.5 mg/dL — ABNORMAL HIGH (ref 0.7–1.3)
GLUCOSE: 134 mg/dL (ref 70–140)
POTASSIUM: 3.2 meq/L — AB (ref 3.5–5.1)
Sodium: 144 mEq/L (ref 136–145)
Total Bilirubin: 0.58 mg/dL (ref 0.20–1.20)
Total Protein: 6.9 g/dL (ref 6.4–8.3)

## 2014-09-04 LAB — CBC WITH DIFFERENTIAL/PLATELET
BASO%: 1.2 % (ref 0.0–2.0)
Basophils Absolute: 0.1 10*3/uL (ref 0.0–0.1)
EOS ABS: 0.1 10*3/uL (ref 0.0–0.5)
EOS%: 2.4 % (ref 0.0–7.0)
HCT: 40.9 % (ref 38.4–49.9)
HGB: 13.7 g/dL (ref 13.0–17.1)
LYMPH%: 16.1 % (ref 14.0–49.0)
MCH: 35.7 pg — ABNORMAL HIGH (ref 27.2–33.4)
MCHC: 33.6 g/dL (ref 32.0–36.0)
MCV: 106.2 fL — AB (ref 79.3–98.0)
MONO#: 0.6 10*3/uL (ref 0.1–0.9)
MONO%: 10 % (ref 0.0–14.0)
NEUT#: 3.9 10*3/uL (ref 1.5–6.5)
NEUT%: 70.3 % (ref 39.0–75.0)
PLATELETS: 209 10*3/uL (ref 140–400)
RBC: 3.85 10*6/uL — AB (ref 4.20–5.82)
RDW: 14.8 % — ABNORMAL HIGH (ref 11.0–14.6)
WBC: 5.5 10*3/uL (ref 4.0–10.3)
lymph#: 0.9 10*3/uL (ref 0.9–3.3)

## 2014-09-04 LAB — UA PROTEIN, DIPSTICK - CHCC: Protein, ur: 300 mg/dL

## 2014-09-04 LAB — CEA: CEA: 46.3 ng/mL — ABNORMAL HIGH (ref 0.0–5.0)

## 2014-09-04 NOTE — Progress Notes (Addendum)
[facility] OFFICE PROGRESS NOTE   Diagnosis:  Rectal cancer  INTERVAL HISTORY:   Mr. Vanderweele returns as scheduled. He was last treated with 5-FU on 08/15/2014. Avastin was held due to increased proteinuria. He tends to have nausea for one to 2 days after the chemotherapy. The nausea resolves over 24-48 hours. No mouth sores. He had diarrhea 3-4 days ago. The diarrhea has resolved. He reports a good appetite. He denies bleeding. No shortness of breath or chest pain. No leg swelling or calf pain. He continues to have left shoulder pain.  Objective:  Vital signs in last 24 hours:  Blood pressure 155/87, pulse 87, temperature 98.4 F (36.9 C), temperature source Oral, resp. rate 18, height _0  (1.753 m), weight 167 lb 9.6 oz (76.023 kg), SpO2 100.00%.    HEENT: No thrush or ulcers. Resp: Lungs clear bilaterally. Cardio: Regular rate and rhythm. GI: Abdomen soft and nontender. No hepatomegaly. Vascular: No leg edema.  Port-A-Cath site without erythema.    Lab Results:  Lab Results  Component Value Date   WBC 5.5 09/04/2014   HGB 13.7 09/04/2014   HCT 40.9 09/04/2014   MCV 106.2* 09/04/2014   PLT 209 09/04/2014   NEUTROABS 3.9 09/04/2014   CEA 46.3  Medications: I have reviewed the patient's current medications.  Assessment/Plan: 1. Metastatic rectal cancer status post biopsy of rectal mass 09/03/2011 with pathology showing invasive adenocarcinoma. Microsatellite stable. Staging CT scans 09/03/2011 showed a 10 mm hypodense rounded lesion in left lateral hepatic lobe, similar 6 mm lesion in the superior right hepatic lobe and a smaller subcapsular lesion in the lateral right hepatic lobe; rectal mass within the lumen of the bowel emanating from the left wall measuring 4.9 x 3.5 cm and a mass within the low left perirectal fat measuring 2.3 x 3.2 cm consistent with local extension of carcinoma. Staging PET scan 09/18/2011 showed a 5.1 cm distal sigmoid/rectal mass with  max SUV 19.2 with local extension into the left perirectal fat with max SUV 7.7. At least 2 suspected hepatic metastases with max SUV 6.7 in the lateral segment left hepatic lobe and max SUV 4.1 in the lateral right hepatic dome. Additional tiny hypodensities in the liver may be cysts; patchy/nodular opacity in the left lower lobe with max SUV 3.5 worrisome for pulmonary metastasis, less likely infectious. He began radiation and concurrent Xeloda chemotherapy on 09/29/2011. He completed radiation on 11/06/2011. MRI of the liver on 12/08/2011 showed 2 hypermetabolic liver lesions, similar in size to 09/03/2011. 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 Equivocal 7 mm lesion in the inferior right hepatic lobe. No evidence of extrahepatic metastasis within the abdomen. An ultrasound-guided biopsy of a small left hepatic lesion on 12/23/2011 was nondiagnostic. He underwent a low anterior resection with loop ileostomy on 02/06/2012 with final pathology showing a 3.5 cm invasive adenocarcinoma with abundant extracellular mucin invading through the muscularis propria into pericolonic fatty tissue; 4 of 10 pericolonic lymph nodes were positive for metastatic carcinoma; resection margins were clear (ypT3,ypN2). K-ras mutation detected. He began FOLFOX chemotherapy on 03/16/2012. He completed 2 cycles of FOLFOX chemotherapy and underwent an ileostomy reversal 05/05/2012. FOLFOX was resumed on 06/14/2012 and completed on 09/14/2012. 2. CEA 13.8 on 09/17/2011; 1.6 on 12/01/2011; 8.4 on 03/09/2012, 2.1 on 05/03/2012; 2.5 on 07/26/2012; 1.8 on 10/12/2012. 3.0 on 11/23/2012. CEA elevated at 25.4 on 02/22/2013, 44.7 on 04/04/2013, and 59.1 on 05/17/2013. CEA improved at 16.8 on 08/10/2013  and 10.8 on 09/23/2013. CEA improved at 8.9 on 10/18/2013. 3. CT chest/abdomen/pelvis 03/10/2013 with 2 new left lower lobe nodules and 2 liver lesions increased in size. Progressive  metastatic disease in the lungs and liver on a restaging CT 06/07/2013. He began treatment with FOLFIRI/Avastin on 06/21/2013. Restaging CT 09/23/2013 after 5 cycles of FOLFIRI/Avastin, with stable lung lesions and a decrease in the size of dominant liver lesions.  Cycle 6 FOLFIRI/Avastin 09/27/2013.  Cycle 7 FOLFIRI/Avastin 10/18/2013.  Cycle 8 FOLFIRI/Avastin 11/09/2013.  Cycle 9 FOLFIRI/Avastin 12/13/2013.  Cycle 10 FOLFIRI/Avastin 01/03/2014.  Cycle 11 FOLFIRI/Avastin 01/30/2014.  Restaging CT 02/13/2014-no significant change in multiple lung/liver lesions.  Cycle 12 FOLFIRI/Avastin 02/20/2014.  Cycle 13 FOLFIRI/Avastin 03/13/2014.  Cycle 14 FOLFIRI/Avastin 04/03/2014  Cycle 15 FOLFIRI/Avastin 04/24/2014  Cycle 16 FOLFIRI/Avastin 05/16/2014.  Restaging CT evaluation 05/30/2014. No significant change in pulmonary lesions. No significant change in hepatic metastasis.  Cycle 17 5-FU/leucovorin/Avastin (irinotecan eliminated from the regimen) 06/12/2014.  Cycle 18 5-FU/leucovorin/Avastin 07/03/2014  cycle 19 5-FU/leucovorin/Avastin 07/25/2014 CEA increased 07/25/2014 and 08/15/2014. Restaging CT evaluation 08/31/2014 with mild progression of lung and liver metastases. 4. K-ras mutation detected. 5.  6. Rectal pain and bleeding secondary to #1. Resolved. 7. Frequent loose stools secondary to #1. Improved. 8. Hypertension. 9. Tobacco use. 10. CT scan 09/03/2011 with chronic right hydronephrosis secondary to an obstructing calculus in the mid right ureter. He is followed by Dr. Janice Norrie. Stable on the CT 06/07/2013 11. Status post cystoscopy, right retrograde pyelogram, ureteroscopy and insertion of right double-J stent 11/28/2011. The double-J stent was replaced 02/06/2012. The stent has been removed. 12. Elevated BUN/creatinine. Likely secondary to dehydration while the ileostomy was in place. He now appears to have chronic renal insufficiency. 13. Status post ultrasound-guided fine-needle  aspiration of small left hepatic lesion 12/22/2011. The pathology revealed no malignancy. 14. Malaise and weight loss. Resolved. 15. High output ileostomy. Partially improved with Imodium. Status post ileostomy reversal 05/05/2012. 16. Erectile dysfunction. He is followed by Dr. Janice Norrie. 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 18. Diarrhea after FOLFOX chemotherapy given on 07/26/2012-resolved. The bolus and infusional 5-FU were dose reduced by 20% beginning with FOLFOX on 08/17/2012.  19. History of an Irregular heart rate- Status post evaluation by Dr. Sarajane Jews on 11/02/2012 with findings of sinus rhythm with occasional PACs. 20. Diarrhea following FOLFIRI/Avastin. He reports the diarrhea is relieved with Lomotil. The diarrhea has markedly improved coinciding with discontinuation of Irinotecan. 21. "Sinus congestion". He is status post evaluation by ENT. Symptoms are better. 22. Hypokalemia 07/05/2013. He continues a potassium supplement.  23. Intermittent bilateral shoulder pain. now with increased pain at the left shoulder requiring frequent tramadol. He has been referred to orthopedics.  24. 24-hour urine 06/07/2014 with 702 mg of protein.   Disposition: Mr. Keltner appears stable. The CEA was further increased on 08/15/2014. Restaging CT 08/31/2014 showed mild progression of lung and liver metastases.  Dr. Benay Spice reviewed the CT results and images with Mr. Kluender.  Options were discussed to include continuation of 5-FU/Avastin, take a break off of treatment, referral for a clinical trial, treatment with Regorafenib.  Mr. Salvato would like to take a break from treatment and be referred for consideration of enrollment on a clinical trial. We made a referral to Dr. Reynaldo Minium at [facility].  Mr. Kluender or return for a followup visit in 3 weeks for additional discussion. He will contact the office in the interim with any problems.  Patient seen with Dr. Benay Spice. 25 minutes  were  spent face-to-face at today's visit with the majority of that time involved in counseling/coordination of care.    Ned Card ANP/GNP-BC   09/04/2014  10:35 AM  This was a shared visit with Ned Card. We reviewed the restaging CT images with Mr. Hornig. The overall appearance is consistent with mild disease progression. The CEA is higher. We reviewed treatment options including continuation of 5-FU/Avastin, resuming treatment with FOLFIRI or FOLFOX, 5-FU/Aflibercept, regorafenib, and referral to consider a clinical trial.  We decided to give him a treatment break and make a referral to Dr. Reynaldo Minium to see if he may be eligible for a clinical trial.  Julieanne Manson, M.D.

## 2014-09-04 NOTE — Telephone Encounter (Signed)
Canceled appt on 9/29 per POF

## 2014-09-04 NOTE — Telephone Encounter (Signed)
Pt confirmed labs/ov per 09/28 POF, sent msg to cancel chemo, no answer at [place] will c/b later and call pt with appt  gave pt AVS..... KJ

## 2014-09-05 ENCOUNTER — Ambulatory Visit: Payer: Medicare Other

## 2014-09-07 ENCOUNTER — Telehealth: Payer: Self-pay | Admitting: Oncology

## 2014-09-07 NOTE — Telephone Encounter (Signed)
Pt appt. With Dr. Reynaldo Minium is 09/14/14@3 :00. Medical records faxed. Slides and scans will be fedex'ed

## 2014-09-13 ENCOUNTER — Telehealth: Payer: Self-pay | Admitting: *Deleted

## 2014-09-13 NOTE — Telephone Encounter (Signed)
Patient calling inquiring about having med rec'ds and scans faxed to [facility]. Per 09/07/14 note from [place]: "Pt appt. With Dr. Reynaldo Minium is 09/14/14@3 :00. Medical records faxed. Slides and scans will be fedex'ed". Shared this info with patient. He voices understanding, appreciated callback.

## 2014-09-13 NOTE — Telephone Encounter (Signed)
Opened in error

## 2014-09-25 ENCOUNTER — Telehealth: Payer: Self-pay | Admitting: Oncology

## 2014-09-25 ENCOUNTER — Ambulatory Visit (HOSPITAL_BASED_OUTPATIENT_CLINIC_OR_DEPARTMENT_OTHER): Payer: Medicare Other | Admitting: Nurse Practitioner

## 2014-09-25 VITALS — BP 158/79 | HR 89 | Temp 97.9°F | Resp 18 | Ht 69.0 in | Wt 173.1 lb

## 2014-09-25 DIAGNOSIS — I10 Essential (primary) hypertension: Secondary | ICD-10-CM

## 2014-09-25 DIAGNOSIS — M25512 Pain in left shoulder: Secondary | ICD-10-CM

## 2014-09-25 DIAGNOSIS — C7800 Secondary malignant neoplasm of unspecified lung: Secondary | ICD-10-CM

## 2014-09-25 DIAGNOSIS — C787 Secondary malignant neoplasm of liver and intrahepatic bile duct: Secondary | ICD-10-CM

## 2014-09-25 DIAGNOSIS — C20 Malignant neoplasm of rectum: Secondary | ICD-10-CM

## 2014-09-25 DIAGNOSIS — M25511 Pain in right shoulder: Secondary | ICD-10-CM

## 2014-09-25 NOTE — Telephone Encounter (Signed)
gv and printed appt sched and avs for pt fro OCT adn NOV....sed added tx.Marland KitchenMarland KitchenMarland KitchenGeorges Mouse sched pt with Dr. Onnie Graham on 11.2.15 at 2:45pm

## 2014-09-25 NOTE — Progress Notes (Addendum)
[facility] OFFICE PROGRESS NOTE   Diagnosis:  Rectal cancer  INTERVAL HISTORY:   [name] [name] returns as scheduled. He feels well. He has a good appetite. He is gaining weight. No abdominal pain. He continues to have left shoulder pain. He takes Aleve as needed. He has not seen orthopedics. No nausea or vomiting. No mouth sores. No diarrhea.  Objective:  Vital signs in last 24 hours:  Blood pressure 158/79, pulse 89, temperature 97.9 F (36.6 C), temperature source Oral, resp. rate 18, height 5\' 9"  (1.753 m), weight 173 lb 1.6 oz (78.518 kg).    HEENT: No thrush or ulcers. Resp: Lungs clear bilaterally. Cardio: Regular rate and rhythm. GI: Abdomen soft and nontender. No hepatomegaly. Vascular: No leg edema.  Skin: Small raised skin lesions, one on each eyelid. Right lateral neck with an approximate 6 mm raised skin lesion.    Lab Results:  Lab Results  Component Value Date   WBC 5.5 09/04/2014   HGB 13.7 09/04/2014   HCT 40.9 09/04/2014   MCV 106.2* 09/04/2014   PLT 209 09/04/2014   NEUTROABS 3.9 09/04/2014    Imaging:  No results found.  Medications: I have reviewed the patient's current medications.  Assessment/Plan: 1. Metastatic rectal cancer status post biopsy of rectal mass 09/03/2011 with pathology showing invasive adenocarcinoma. Microsatellite stable. Staging CT scans 09/03/2011 showed a 10 mm hypodense rounded lesion in left lateral hepatic lobe, similar 6 mm lesion in the superior right hepatic lobe and a smaller subcapsular lesion in the lateral right hepatic lobe; rectal mass within the lumen of the bowel emanating from the left wall measuring 4.9 x 3.5 cm and a mass within the low left perirectal fat measuring 2.3 x 3.2 cm consistent with local extension of carcinoma. Staging PET scan 09/18/2011 showed a 5.1 cm distal sigmoid/rectal mass with max SUV 19.2 with local extension into the left perirectal fat with max SUV 7.7. At least 2 suspected  hepatic metastases with max SUV 6.7 in the lateral segment left hepatic lobe and max SUV 4.1 in the lateral right hepatic dome. Additional tiny hypodensities in the liver may be cysts; patchy/nodular opacity in the left lower lobe with max SUV 3.5 worrisome for pulmonary metastasis, less likely infectious. He began radiation and concurrent Xeloda chemotherapy on 09/29/2011. He completed radiation on 11/06/2011. MRI of the liver on 12/08/2011 showed 2 hypermetabolic liver lesions, similar in size to 09/03/2011. 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 Equivocal 7 mm lesion in the inferior right hepatic lobe. No evidence of extrahepatic metastasis within the abdomen. An ultrasound-guided biopsy of a small left hepatic lesion on 12/23/2011 was nondiagnostic. He underwent a low anterior resection with loop ileostomy on 02/06/2012 with final pathology showing a 3.5 cm invasive adenocarcinoma with abundant extracellular mucin invading through the muscularis propria into pericolonic fatty tissue; 4 of 10 pericolonic lymph nodes were positive for metastatic carcinoma; resection margins were clear (ypT3,ypN2). K-ras mutation detected. He began FOLFOX chemotherapy on 03/16/2012. He completed 2 cycles of FOLFOX chemotherapy and underwent an ileostomy reversal 05/05/2012. FOLFOX was resumed on 06/14/2012 and completed on 09/14/2012. 2. CEA 13.8 on 09/17/2011; 1.6 on 12/01/2011; 8.4 on 03/09/2012, 2.1 on 05/03/2012; 2.5 on 07/26/2012; 1.8 on 10/12/2012. 3.0 on 11/23/2012. CEA elevated at 25.4 on 02/22/2013, 44.7 on 04/04/2013, and 59.1 on 05/17/2013. CEA improved at 16.8 on 08/10/2013 and 10.8 on 09/23/2013. CEA improved at 8.9 on 10/18/2013. 3. CT chest/abdomen/pelvis 03/10/2013 with 2  new left lower lobe nodules and 2 liver lesions increased in size. Progressive metastatic disease in the lungs and liver on a restaging CT 06/07/2013. He began treatment with  FOLFIRI/Avastin on 06/21/2013. Restaging CT 09/23/2013 after 5 cycles of FOLFIRI/Avastin, with stable lung lesions and a decrease in the size of dominant liver lesions.  Cycle 6 FOLFIRI/Avastin 09/27/2013.  Cycle 7 FOLFIRI/Avastin 10/18/2013.  Cycle 8 FOLFIRI/Avastin 11/09/2013.  Cycle 9 FOLFIRI/Avastin 12/13/2013.  Cycle 10 FOLFIRI/Avastin 01/03/2014.  Cycle 11 FOLFIRI/Avastin 01/30/2014.  Restaging CT 02/13/2014-no significant change in multiple lung/liver lesions.  Cycle 12 FOLFIRI/Avastin 02/20/2014.  Cycle 13 FOLFIRI/Avastin 03/13/2014.  Cycle 14 FOLFIRI/Avastin 04/03/2014  Cycle 15 FOLFIRI/Avastin 04/24/2014  Cycle 16 FOLFIRI/Avastin 05/16/2014.  Restaging CT evaluation 05/30/2014. No significant change in pulmonary lesions. No significant change in hepatic metastasis.  Cycle 17 5-FU/leucovorin/Avastin (irinotecan eliminated from the regimen) 06/12/2014.  Cycle 18 5-FU/leucovorin/Avastin 07/03/2014  cycle 19 5-FU/leucovorin/Avastin 07/25/2014  CEA increased 07/25/2014 and 08/15/2014.  Restaging CT evaluation 08/31/2014 with mild progression of lung and liver metastases. 4. K-ras mutation detected. 5. Rectal pain and bleeding secondary to #1. Resolved. 6. Frequent loose stools secondary to #1. Improved. 7. Hypertension. 8. Tobacco use. 9. CT scan 09/03/2011 with chronic right hydronephrosis secondary to an obstructing calculus in the mid right ureter. He is followed by Dr. Janice Norrie. Stable on the CT 06/07/2013 10. Status post cystoscopy, right retrograde pyelogram, ureteroscopy and insertion of right double-J stent 11/28/2011. The double-J stent was replaced 02/06/2012. The stent has been removed. 11. Elevated BUN/creatinine. Likely secondary to dehydration while the ileostomy was in place. He now appears to have chronic renal insufficiency. 12. Status post ultrasound-guided fine-needle aspiration of small left hepatic lesion 12/22/2011. The pathology revealed no malignancy. 13. Malaise  and weight loss. Resolved. 14. High output ileostomy. Partially improved with Imodium. Status post ileostomy reversal 05/05/2012. 15. Erectile dysfunction. He is followed by Dr. Janice Norrie. 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 17. Diarrhea after FOLFOX chemotherapy given on 07/26/2012-resolved. The bolus and infusional 5-FU were dose reduced by 20% beginning with FOLFOX on 08/17/2012.  18. History of an Irregular heart rate- Status post evaluation by Dr. Sarajane Jews on 11/02/2012 with findings of sinus rhythm with occasional PACs. 19. Diarrhea following FOLFIRI/Avastin. He reports the diarrhea is relieved with Lomotil. The diarrhea has markedly improved coinciding with discontinuation of Irinotecan. 20. "Sinus congestion". He is status post evaluation by ENT. Symptoms are better. 21. Hypokalemia 07/05/2013. He continues a potassium supplement.  22. Intermittent bilateral shoulder pain. now with increased pain at the left shoulder requiring frequent tramadol. He has been referred to orthopedics.  23. 24-hour urine 06/07/2014 with 702 mg of protein.   Disposition: [name] [name] appears stable. He was seen by Dr. Reynaldo [name] at [facility]. Dr. Reynaldo [name] recommends resuming FOLFIRI/Avastin with a dose reduction of the irinotecan to 90-120 mg per meter squared. [name] [name] is comfortable with this plan. He would like to resume treatment on 10/02/2014 on a 3 week schedule.  Avastin was recently placed on hold due to an increase in the urine protein. We will obtain a followup urine protein prior to resuming treatment on 10/02/2014.  We will see him in followup prior to treatment on 10/23/2014. He will contact the office in the interim with any problems. The skin lesions he showed to [place] today appear benign. He will contact the office with any change.  Patient seen with Dr. Benay Spice.      Ned Card ANP/GNP-BC   09/25/2014  10:51 AM  This was a shared visit with Ned Card. [name] [name]  appears stable. I discussed the case with Dr. Reynaldo [name]. He recommends resuming FOLFIRI/Avastin. [name] [name] is in agreement. We will dose reduce the irinotecan.  The plan is to resume FOLFIRI and Avastin 10/02/2014.  He may be a candidate for a clinical trial for K-ras mutated patients if he has progressive disease on FOLFIRI/Avastin.  Julieanne Manson, M.D.

## 2014-09-25 NOTE — Telephone Encounter (Signed)
Faxed pt medical records to Dr. Susie Cassette office

## 2014-10-01 ENCOUNTER — Other Ambulatory Visit: Payer: Self-pay | Admitting: Oncology

## 2014-10-02 ENCOUNTER — Other Ambulatory Visit (HOSPITAL_BASED_OUTPATIENT_CLINIC_OR_DEPARTMENT_OTHER): Payer: Medicare Other

## 2014-10-02 ENCOUNTER — Other Ambulatory Visit: Payer: Medicare Other

## 2014-10-02 ENCOUNTER — Telehealth: Payer: Self-pay | Admitting: *Deleted

## 2014-10-02 ENCOUNTER — Ambulatory Visit (HOSPITAL_BASED_OUTPATIENT_CLINIC_OR_DEPARTMENT_OTHER): Payer: Medicare Other

## 2014-10-02 VITALS — BP 170/80 | HR 80 | Temp 97.5°F | Resp 18

## 2014-10-02 DIAGNOSIS — Z5111 Encounter for antineoplastic chemotherapy: Secondary | ICD-10-CM

## 2014-10-02 DIAGNOSIS — C7800 Secondary malignant neoplasm of unspecified lung: Secondary | ICD-10-CM

## 2014-10-02 DIAGNOSIS — C20 Malignant neoplasm of rectum: Secondary | ICD-10-CM

## 2014-10-02 DIAGNOSIS — C787 Secondary malignant neoplasm of liver and intrahepatic bile duct: Secondary | ICD-10-CM

## 2014-10-02 DIAGNOSIS — Z5112 Encounter for antineoplastic immunotherapy: Secondary | ICD-10-CM

## 2014-10-02 LAB — CBC WITH DIFFERENTIAL/PLATELET
BASO%: 0.7 % (ref 0.0–2.0)
Basophils Absolute: 0 10*3/uL (ref 0.0–0.1)
EOS ABS: 0.2 10*3/uL (ref 0.0–0.5)
EOS%: 2.6 % (ref 0.0–7.0)
HEMATOCRIT: 38.1 % — AB (ref 38.4–49.9)
HGB: 12.8 g/dL — ABNORMAL LOW (ref 13.0–17.1)
LYMPH%: 10.8 % — ABNORMAL LOW (ref 14.0–49.0)
MCH: 35.1 pg — AB (ref 27.2–33.4)
MCHC: 33.7 g/dL (ref 32.0–36.0)
MCV: 104.3 fL — AB (ref 79.3–98.0)
MONO#: 0.3 10*3/uL (ref 0.1–0.9)
MONO%: 4.7 % (ref 0.0–14.0)
NEUT#: 5.7 10*3/uL (ref 1.5–6.5)
NEUT%: 81.2 % — ABNORMAL HIGH (ref 39.0–75.0)
Platelets: 177 10*3/uL (ref 140–400)
RBC: 3.66 10*6/uL — ABNORMAL LOW (ref 4.20–5.82)
RDW: 13.5 % (ref 11.0–14.6)
WBC: 7 10*3/uL (ref 4.0–10.3)
lymph#: 0.8 10*3/uL — ABNORMAL LOW (ref 0.9–3.3)

## 2014-10-02 LAB — COMPREHENSIVE METABOLIC PANEL (CC13)
ALBUMIN: 2.9 g/dL — AB (ref 3.5–5.0)
ALK PHOS: 129 U/L (ref 40–150)
ALT: 15 U/L (ref 0–55)
AST: 19 U/L (ref 5–34)
Anion Gap: 10 mEq/L (ref 3–11)
BUN: 24.3 mg/dL (ref 7.0–26.0)
CO2: 23 mEq/L (ref 22–29)
Calcium: 8.6 mg/dL (ref 8.4–10.4)
Chloride: 111 mEq/L — ABNORMAL HIGH (ref 98–109)
Creatinine: 1.5 mg/dL — ABNORMAL HIGH (ref 0.7–1.3)
GLUCOSE: 112 mg/dL (ref 70–140)
Potassium: 3.2 mEq/L — ABNORMAL LOW (ref 3.5–5.1)
SODIUM: 144 meq/L (ref 136–145)
TOTAL PROTEIN: 6.2 g/dL — AB (ref 6.4–8.3)
Total Bilirubin: 0.6 mg/dL (ref 0.20–1.20)

## 2014-10-02 LAB — UA PROTEIN, DIPSTICK - CHCC: Protein, ur: 300 mg/dL

## 2014-10-02 MED ORDER — SODIUM CHLORIDE 0.9 % IV SOLN
5.0000 mg/kg | Freq: Once | INTRAVENOUS | Status: AC
  Administered 2014-10-02: 375 mg via INTRAVENOUS
  Filled 2014-10-02: qty 15

## 2014-10-02 MED ORDER — PALONOSETRON HCL INJECTION 0.25 MG/5ML
INTRAVENOUS | Status: AC
  Filled 2014-10-02: qty 5

## 2014-10-02 MED ORDER — SODIUM CHLORIDE 0.9 % IV SOLN: Freq: Once | INTRAVENOUS | Status: DC

## 2014-10-02 MED ORDER — SODIUM CHLORIDE 0.9 % IV SOLN
1920.0000 mg/m2 | INTRAVENOUS | Status: DC
  Administered 2014-10-02: 3900 mg via INTRAVENOUS
  Filled 2014-10-02: qty 78

## 2014-10-02 MED ORDER — LEUCOVORIN CALCIUM INJECTION 350 MG
320.0000 mg/m2 | Freq: Once | INTRAVENOUS | Status: AC
  Administered 2014-10-02: 646 mg via INTRAVENOUS
  Filled 2014-10-02: qty 32.3

## 2014-10-02 MED ORDER — SODIUM CHLORIDE 0.9 % IJ SOLN
10.0000 mL | INTRAMUSCULAR | Status: DC | PRN
  Filled 2014-10-02: qty 10

## 2014-10-02 MED ORDER — FLUOROURACIL CHEMO INJECTION 2.5 GM/50ML
320.0000 mg/m2 | Freq: Once | INTRAVENOUS | Status: AC
  Administered 2014-10-02: 650 mg via INTRAVENOUS
  Filled 2014-10-02: qty 13

## 2014-10-02 MED ORDER — DEXAMETHASONE SODIUM PHOSPHATE 20 MG/5ML IJ SOLN
20.0000 mg | Freq: Once | INTRAMUSCULAR | Status: AC
  Administered 2014-10-02: 20 mg via INTRAVENOUS

## 2014-10-02 MED ORDER — DEXTROSE 5 % IV SOLN
90.0000 mg/m2 | Freq: Once | INTRAVENOUS | Status: AC
  Administered 2014-10-02: 182 mg via INTRAVENOUS
  Filled 2014-10-02: qty 9.1

## 2014-10-02 MED ORDER — PALONOSETRON HCL INJECTION 0.25 MG/5ML
0.2500 mg | Freq: Once | INTRAVENOUS | Status: AC
  Administered 2014-10-02: 0.25 mg via INTRAVENOUS

## 2014-10-02 MED ORDER — DEXAMETHASONE SODIUM PHOSPHATE 20 MG/5ML IJ SOLN
INTRAMUSCULAR | Status: AC
  Filled 2014-10-02: qty 5

## 2014-10-02 NOTE — Progress Notes (Signed)
Per Dr Benay Spice, ok to treat with urine protein 300.

## 2014-10-02 NOTE — Telephone Encounter (Signed)
Called patient regarding potassium.  Patient stated that he has not been taking his potassium.  Informed patient to start taking potassium again.  Per Elby Showers. Thomas.  Patient verbalized understanding.

## 2014-10-02 NOTE — Telephone Encounter (Signed)
Message copied by Wardell Heath on Mon Oct 02, 2014  4:25 PM ------      Message from: Owens Shark      Created: Mon Oct 02, 2014  4:16 PM       Please verify with Mr. Ende if he is taking potassium and at what dose. Thanks. ------

## 2014-10-02 NOTE — Patient Instructions (Signed)
RETURN TO CLINIC WED 10/28 AT 12:00.     [facility] Discharge Instructions for Patients Receiving Chemotherapy  Today you received the following chemotherapy agents: Leucovorin, irinotecan, avastin, 47fu  To help prevent nausea and vomiting after your treatment, we encourage you to take your nausea medication.  Take it as often as prescribed.     If you develop nausea and vomiting that is not controlled by your nausea medication, call the clinic. If it is after clinic hours your family physician or the after hours number for the clinic or go to the Emergency Department.   BELOW ARE SYMPTOMS THAT SHOULD BE REPORTED IMMEDIATELY:  *FEVER GREATER THAN 100.5 F  *CHILLS WITH OR WITHOUT FEVER  NAUSEA AND VOMITING THAT IS NOT CONTROLLED WITH YOUR NAUSEA MEDICATION  *UNUSUAL SHORTNESS OF BREATH  *UNUSUAL BRUISING OR BLEEDING  TENDERNESS IN MOUTH AND THROAT WITH OR WITHOUT PRESENCE OF ULCERS  *URINARY PROBLEMS  *BOWEL PROBLEMS  UNUSUAL RASH Items with * indicate a potential emergency and should be followed up as soon as possible.  Feel free to call the clinic you have any questions or concerns. The clinic phone number is [phone].   I have been informed and understand all the instructions given to me. I know to contact the clinic, my physician, or go to the Emergency Department if any problems should occur. I do not have any questions at this time, but understand that I may call the clinic during office hours   should I have any questions or need assistance in obtaining follow up care.    __________________________________________  _____________  __________ Signature of Patient or Authorized Representative            Date                   Time    __________________________________________ Nurse's Signature

## 2014-10-03 LAB — CEA: CEA: 51 ng/mL — AB (ref 0.0–5.0)

## 2014-10-04 ENCOUNTER — Ambulatory Visit (HOSPITAL_BASED_OUTPATIENT_CLINIC_OR_DEPARTMENT_OTHER): Payer: Medicare Other

## 2014-10-04 VITALS — BP 140/82 | HR 101 | Temp 98.7°F

## 2014-10-04 DIAGNOSIS — C7800 Secondary malignant neoplasm of unspecified lung: Secondary | ICD-10-CM

## 2014-10-04 DIAGNOSIS — C20 Malignant neoplasm of rectum: Secondary | ICD-10-CM

## 2014-10-04 DIAGNOSIS — C787 Secondary malignant neoplasm of liver and intrahepatic bile duct: Secondary | ICD-10-CM

## 2014-10-04 MED ORDER — SODIUM CHLORIDE 0.9 % IJ SOLN
10.0000 mL | INTRAMUSCULAR | Status: DC | PRN
  Administered 2014-10-04: 10 mL
  Filled 2014-10-04: qty 10

## 2014-10-04 MED ORDER — HEPARIN SOD (PORK) LOCK FLUSH 100 UNIT/ML IV SOLN
500.0000 [IU] | Freq: Once | INTRAVENOUS | Status: AC | PRN
  Administered 2014-10-04: 500 [IU]
  Filled 2014-10-04: qty 5

## 2014-10-16 ENCOUNTER — Ambulatory Visit (INDEPENDENT_AMBULATORY_CARE_PROVIDER_SITE_OTHER): Payer: Medicare Other | Admitting: Family Medicine

## 2014-10-16 ENCOUNTER — Encounter: Payer: Self-pay | Admitting: Family Medicine

## 2014-10-16 ENCOUNTER — Ambulatory Visit (INDEPENDENT_AMBULATORY_CARE_PROVIDER_SITE_OTHER): Payer: Medicare Other

## 2014-10-16 VITALS — BP 160/92 | Temp 97.8°F | Wt 172.0 lb

## 2014-10-16 DIAGNOSIS — N183 Chronic kidney disease, stage 3 unspecified: Secondary | ICD-10-CM

## 2014-10-16 DIAGNOSIS — N189 Chronic kidney disease, unspecified: Secondary | ICD-10-CM

## 2014-10-16 DIAGNOSIS — I10 Essential (primary) hypertension: Secondary | ICD-10-CM

## 2014-10-16 DIAGNOSIS — Z23 Encounter for immunization: Secondary | ICD-10-CM

## 2014-10-16 NOTE — Assessment & Plan Note (Signed)
Stable but not on ace or arb. Consider starting but want nephrology aid. I am happy to do follow up bloodwork for creatinine/potassium in 2 weeks from start.

## 2014-10-16 NOTE — Patient Instructions (Signed)
Go see your kidney doctor at scheduled appointment. Continue current medicines. Follow up with me 3 months from now at the latest.

## 2014-10-16 NOTE — Progress Notes (Signed)
Garret Reddish, MD Phone: [phone]  Subjective:   [name] [name] is a 63 y.o. year old very pleasant male patient who presents with the following:  Hypertension-poor control  CKD Stage III-stable BP Readings from Last 3 Encounters:  10/16/14 160/92  10/04/14 140/82  10/02/14 170/80  amlodipine 10mg , terazosin 5mg . Reasonable control at oncology on 10/28 but still want <140 given CKD. I have not started an ace-i due to concerns about his right kidney with known structural abnormality and patient reports 18% function.  Home BP monitoring-no Compliant with medications-yes without side effects ROS-Denies any blurry vision or headache. No LE edema. Does complain of intermittent lightheadedness with standing.   Past Medical History- Patient Active Problem List   Diagnosis Date Noted  . Rectal cancer, approx. 11 cm from anal verge, s/p LAR and loop ileostomy 02/06/12 09/08/2011    Priority: High  . CIGARETTE SMOKER 01/07/2010    Priority: High  . Hypokalemia 07/14/2014    Priority: Medium  . Chronic renal insufficiency, stage III (moderate) 04/18/2013    Priority: Medium  . Hydronephrosis of right kidney 10/06/2011    Priority: Medium  . HYPERSOMNIA, ASSOCIATED WITH SLEEP APNEA 06/27/2010    Priority: Medium  . Essential hypertension 07/09/2007    Priority: Medium  . Lung metastases 04/18/2013    Priority: Low  . Liver masses, seen on CT scan 09/08/2011    Priority: Low  . Chronic frontal sinusitis 06/27/2010    Priority: Low  . Internal hemorrhoids with other complication 78/46/9629    Priority: Low   Medications- reviewed and updated Current Outpatient Prescriptions  Medication Sig Dispense Refill  . acetaminophen (TYLENOL) 325 MG tablet Take 650 mg by mouth every 6 (six) hours as needed.    Marland Kitchen amLODipine (NORVASC) 10 MG tablet Take 1 tablet (10 mg total) by mouth daily. 90 tablet 0  . naproxen sodium (ANAPROX) 220 MG tablet Take 220 mg by mouth 2 (two) times daily  with a meal.    . pantoprazole (PROTONIX) 40 MG tablet Take 1 tablet (40 mg total) by mouth 2 (two) times daily. 60 tablet 2  . potassium chloride SA (KLOR-CON M20) 20 MEQ tablet Take 1 tablet (20 mEq total) by mouth daily. 90 tablet 3  . prochlorperazine (COMPAZINE) 10 MG tablet Take 10 mg by mouth every 6 (six) hours as needed for nausea or vomiting.    . terazosin (HYTRIN) 5 MG capsule Take 1 capsule (5 mg total) by mouth at bedtime. 30 capsule prn  . traMADol (ULTRAM) 50 MG tablet Take 1 tablet (50 mg total) by mouth every 12 (twelve) hours as needed. 40 tablet 0  . diphenoxylate-atropine (LOMOTIL) 2.5-0.025 MG per tablet Take 1 tablet by mouth 4 (four) times daily as needed for diarrhea or loose stools. 120 tablet 1  . fluticasone (FLONASE) 50 MCG/ACT nasal spray Place 1 spray into both nostrils daily.      No current facility-administered medications for this visit.    Objective: BP 160/92 mmHg  Temp(Src) 97.8 F (36.6 C)  Wt 172 lb (78.019 kg) Gen: NAD, resting comfortably in chair CV: RRR no murmurs rubs or gallops Lungs: CTAB no crackles, wheeze, rhonchi Abdomen: soft/nontender/nondistended/normal bowel sounds.  Ext: no edema Skin: warm, dry, no rash Neuro: grossly normal, moves all extremities   Lab Results  Component Value Date   CREATININE 1.5* 10/02/2014   CREATININE 1.5* 09/04/2014   CREATININE 1.4* 08/15/2014       Ref Range 2wk ago (10/02/14) 28mo  ago (09/04/14) 74mo ago (08/15/14) 27mo ago (07/25/14) 73mo ago (07/03/14)    Sodium 136 - 145 mEq/L 144 144 141 143 144    Potassium 3.5 - 5.1 mEq/L 3.2 (L) 3.2 (L) 3.5 3.5 3.4 (L)    Chloride 98 - 109 mEq/L 111 (H) 109 108 109 110 (H)    CO2 22 - 29 mEq/L 23 25 22 25 24     Glucose 70 - 140 mg/dl 112 134 139 141 (H) 88    BUN 7.0 - 26.0 mg/dL 24.3 23.0 25.1 21.3 23.7    Creatinine 0.7 - 1.3 mg/dL 1.5 (H) 1.5 (H) 1.4 (H) 1.4 (H) 1.6 (H)    Total Bilirubin 0.20 - 1.20 mg/dL 0.60 0.58 0.49 0.61 0.64    Alkaline  Phosphatase 40 - 150 U/L 129 139 131 120 123    AST 5 - 34 U/L 19 20 17 18 18     ALT 0 - 55 U/L 15 12 11 12 12     Total Protein 6.4 - 8.3 g/dL 6.2 (L) 6.9 6.6 6.7 6.9    Albumin 3.5 - 5.0 g/dL 2.9 (L) 3.0 (L) 3.0 (L) 3.2 (L) 3.4 (L)    Calcium 8.4 - 10.4 mg/dL 8.6 9.2 8.6 8.8 9.1    Anion Gap 3 - 11 mEq/L 10 10 11 10 10       Assessment/Plan:  Essential hypertension Poorly controlled today on amlodipine 10mg  and terazosin 5mg . Last BP was 140/82 which was more ideal but still above goal given known CKD stage III. I did not want to start an ace or arb without nephrology recs given right kidney issues (18% reported function). Given orthostatic symptoms and already on alpha blocker did not want to add beta blocker. Can't use HCTZ due to hypokalemia. Considered clonidine but patient with compliance issues in past. Ultimately, decided together that wanted nephrology recs so we set patient up with an appointment with nephrology.     Chronic renal insufficiency, stage III (moderate) Stable but not on ace or arb. Consider starting but want nephrology aid. I am happy to do follow up bloodwork for creatinine/potassium in 2 weeks from start.    Patient does have known rectal cancer on chemotherapy but he reports he has not been given a limited life expectancy so I believe BP/CKD control is important at this point.

## 2014-10-16 NOTE — Assessment & Plan Note (Signed)
Poorly controlled today on amlodipine 10mg  and terazosin 5mg . Last BP was 140/82 which was more ideal but still above goal given known CKD stage III. I did not want to start an ace or arb without nephrology recs given right kidney issues (18% reported function). Given orthostatic symptoms and already on alpha blocker did not want to add beta blocker. Can't use HCTZ due to hypokalemia. Considered clonidine but patient with compliance issues in past. Ultimately, decided together that wanted nephrology recs so we set patient up with an appointment with nephrology.

## 2014-10-22 ENCOUNTER — Other Ambulatory Visit: Payer: Self-pay | Admitting: Oncology

## 2014-10-23 ENCOUNTER — Ambulatory Visit: Payer: Medicare Other

## 2014-10-23 ENCOUNTER — Other Ambulatory Visit: Payer: Medicare Other

## 2014-10-23 ENCOUNTER — Ambulatory Visit: Payer: Medicare Other | Admitting: Oncology

## 2014-11-06 ENCOUNTER — Telehealth: Payer: Self-pay | Admitting: *Deleted

## 2014-11-06 NOTE — Telephone Encounter (Signed)
Called to request reschedule of his lab/MD visit/chemo that he missed on 11/16 due to having the flu. Says "Monday or Wednesday around 0900-1030 works best for me". POF to scheduler.

## 2014-11-07 ENCOUNTER — Telehealth: Payer: Self-pay | Admitting: Nurse Practitioner

## 2014-11-07 ENCOUNTER — Telehealth: Payer: Self-pay | Admitting: *Deleted

## 2014-11-07 NOTE — Telephone Encounter (Signed)
per pof to r/s pt appt-sent MW emailto r/s trmt-will call pt once reply

## 2014-11-07 NOTE — Telephone Encounter (Signed)
cld & spoke to pt & gave pt time 7 date of appt-pt understood

## 2014-11-07 NOTE — Telephone Encounter (Signed)
Per staff message and POF I have scheduled appts. Advised scheduler of appts. JMW  

## 2014-11-09 ENCOUNTER — Other Ambulatory Visit: Payer: Self-pay | Admitting: Family Medicine

## 2014-11-12 ENCOUNTER — Other Ambulatory Visit: Payer: Self-pay | Admitting: Oncology

## 2014-11-15 ENCOUNTER — Ambulatory Visit (HOSPITAL_BASED_OUTPATIENT_CLINIC_OR_DEPARTMENT_OTHER): Payer: Medicare Other | Admitting: Nurse Practitioner

## 2014-11-15 ENCOUNTER — Telehealth: Payer: Self-pay | Admitting: *Deleted

## 2014-11-15 ENCOUNTER — Other Ambulatory Visit (HOSPITAL_BASED_OUTPATIENT_CLINIC_OR_DEPARTMENT_OTHER): Payer: Medicare Other

## 2014-11-15 ENCOUNTER — Ambulatory Visit (HOSPITAL_BASED_OUTPATIENT_CLINIC_OR_DEPARTMENT_OTHER): Payer: Medicare Other

## 2014-11-15 ENCOUNTER — Telehealth: Payer: Self-pay | Admitting: Oncology

## 2014-11-15 VITALS — BP 129/83 | HR 99 | Temp 98.2°F | Resp 20 | Ht 69.0 in | Wt 166.1 lb

## 2014-11-15 DIAGNOSIS — C20 Malignant neoplasm of rectum: Secondary | ICD-10-CM

## 2014-11-15 DIAGNOSIS — R809 Proteinuria, unspecified: Secondary | ICD-10-CM

## 2014-11-15 DIAGNOSIS — Z5112 Encounter for antineoplastic immunotherapy: Secondary | ICD-10-CM

## 2014-11-15 DIAGNOSIS — E876 Hypokalemia: Secondary | ICD-10-CM

## 2014-11-15 DIAGNOSIS — C787 Secondary malignant neoplasm of liver and intrahepatic bile duct: Secondary | ICD-10-CM

## 2014-11-15 DIAGNOSIS — C7800 Secondary malignant neoplasm of unspecified lung: Secondary | ICD-10-CM

## 2014-11-15 DIAGNOSIS — Z5111 Encounter for antineoplastic chemotherapy: Secondary | ICD-10-CM

## 2014-11-15 LAB — COMPREHENSIVE METABOLIC PANEL (CC13)
ALT: 16 U/L (ref 0–55)
AST: 19 U/L (ref 5–34)
Albumin: 2.4 g/dL — ABNORMAL LOW (ref 3.5–5.0)
Alkaline Phosphatase: 145 U/L (ref 40–150)
Anion Gap: 12 mEq/L — ABNORMAL HIGH (ref 3–11)
BUN: 22.3 mg/dL (ref 7.0–26.0)
CALCIUM: 8.8 mg/dL (ref 8.4–10.4)
CO2: 23 mEq/L (ref 22–29)
Chloride: 107 mEq/L (ref 98–109)
Creatinine: 1.5 mg/dL — ABNORMAL HIGH (ref 0.7–1.3)
EGFR: 55 mL/min/{1.73_m2} — ABNORMAL LOW (ref >90)
Glucose: 126 mg/dl (ref 70–140)
Potassium: 3.3 mEq/L — ABNORMAL LOW (ref 3.5–5.1)
SODIUM: 142 meq/L (ref 136–145)
TOTAL PROTEIN: 7 g/dL (ref 6.4–8.3)
Total Bilirubin: 0.49 mg/dL (ref 0.20–1.20)

## 2014-11-15 LAB — UA PROTEIN, DIPSTICK - CHCC: Protein, ur: 300 mg/dL

## 2014-11-15 LAB — CBC WITH DIFFERENTIAL/PLATELET
BASO%: 0.4 % (ref 0.0–2.0)
Basophils Absolute: 0 10*3/uL (ref 0.0–0.1)
EOS%: 2 % (ref 0.0–7.0)
Eosinophils Absolute: 0.2 10*3/uL (ref 0.0–0.5)
HCT: 33 % — ABNORMAL LOW (ref 38.4–49.9)
HGB: 10.6 g/dL — ABNORMAL LOW (ref 13.0–17.1)
LYMPH#: 0.8 10*3/uL — AB (ref 0.9–3.3)
LYMPH%: 9.4 % — ABNORMAL LOW (ref 14.0–49.0)
MCH: 32.5 pg (ref 27.2–33.4)
MCHC: 32.2 g/dL (ref 32.0–36.0)
MCV: 100.7 fL — ABNORMAL HIGH (ref 79.3–98.0)
MONO#: 0.4 10*3/uL (ref 0.1–0.9)
MONO%: 4.5 % (ref 0.0–14.0)
NEUT#: 7.1 10*3/uL — ABNORMAL HIGH (ref 1.5–6.5)
NEUT%: 83.7 % — ABNORMAL HIGH (ref 39.0–75.0)
Platelets: 302 10*3/uL (ref 140–400)
RBC: 3.27 10*6/uL — ABNORMAL LOW (ref 4.20–5.82)
RDW: 16.1 % — AB (ref 11.0–14.6)
WBC: 8.5 10*3/uL (ref 4.0–10.3)

## 2014-11-15 MED ORDER — PALONOSETRON HCL INJECTION 0.25 MG/5ML
INTRAVENOUS | Status: AC
  Filled 2014-11-15: qty 5

## 2014-11-15 MED ORDER — DEXAMETHASONE SODIUM PHOSPHATE 20 MG/5ML IJ SOLN
INTRAMUSCULAR | Status: AC
  Filled 2014-11-15: qty 5

## 2014-11-15 MED ORDER — ATROPINE SULFATE 1 MG/ML IJ SOLN
INTRAMUSCULAR | Status: AC
  Filled 2014-11-15: qty 1

## 2014-11-15 MED ORDER — LEUCOVORIN CALCIUM INJECTION 350 MG
320.0000 mg/m2 | Freq: Once | INTRAVENOUS | Status: AC
  Administered 2014-11-15: 646 mg via INTRAVENOUS
  Filled 2014-11-15: qty 32.3

## 2014-11-15 MED ORDER — FLUOROURACIL CHEMO INJECTION 2.5 GM/50ML
320.0000 mg/m2 | Freq: Once | INTRAVENOUS | Status: AC
  Administered 2014-11-15: 650 mg via INTRAVENOUS
  Filled 2014-11-15: qty 13

## 2014-11-15 MED ORDER — PALONOSETRON HCL INJECTION 0.25 MG/5ML
0.2500 mg | Freq: Once | INTRAVENOUS | Status: AC
  Administered 2014-11-15: 0.25 mg via INTRAVENOUS

## 2014-11-15 MED ORDER — ATROPINE SULFATE 1 MG/ML IJ SOLN
0.5000 mg | Freq: Once | INTRAMUSCULAR | Status: AC
  Administered 2014-11-15: 0.5 mg via INTRAVENOUS

## 2014-11-15 MED ORDER — SODIUM CHLORIDE 0.9 % IV SOLN
5.0000 mg/kg | Freq: Once | INTRAVENOUS | Status: AC
  Administered 2014-11-15: 375 mg via INTRAVENOUS
  Filled 2014-11-15: qty 15

## 2014-11-15 MED ORDER — IRINOTECAN HCL CHEMO INJECTION 100 MG/5ML
90.0000 mg/m2 | Freq: Once | INTRAVENOUS | Status: AC
  Administered 2014-11-15: 182 mg via INTRAVENOUS
  Filled 2014-11-15: qty 9.1

## 2014-11-15 MED ORDER — SODIUM CHLORIDE 0.9 % IV SOLN
1920.0000 mg/m2 | INTRAVENOUS | Status: DC
  Administered 2014-11-15: 3900 mg via INTRAVENOUS
  Filled 2014-11-15: qty 78

## 2014-11-15 MED ORDER — SODIUM CHLORIDE 0.9 % IV SOLN: Freq: Once | INTRAVENOUS | Status: DC

## 2014-11-15 MED ORDER — SODIUM CHLORIDE 0.9 % IV SOLN
Freq: Once | INTRAVENOUS | Status: AC
  Administered 2014-11-15: 11:00:00 via INTRAVENOUS

## 2014-11-15 MED ORDER — DEXAMETHASONE SODIUM PHOSPHATE 20 MG/5ML IJ SOLN
20.0000 mg | Freq: Once | INTRAMUSCULAR | Status: AC
  Administered 2014-11-15: 20 mg via INTRAVENOUS

## 2014-11-15 NOTE — Telephone Encounter (Signed)
Per staff message and POF I have scheduled appts. Advised scheduler of appts, no available on 12/28 for treatment moved to 12/29. JMW

## 2014-11-15 NOTE — Progress Notes (Signed)
[place] OFFICE PROGRESS NOTE   Diagnosis:  Rectal cancer  INTERVAL HISTORY:   [name] [name] returns as scheduled. Treatment was resumed with FOLFIRI/Avastin 10/02/2014. He had mild nausea and 1 episode of vomiting. No significant diarrhea. No mouth sores. He reports occasional "shooting pains" at various locations over the abdomen lasting a few seconds. He has mild dyspnea on exertion since a recent cold. No chest pain. No leg swelling or calf pain. He denies any bleeding.  He canceled cycle 2 on 10/23/2014 due to a "cold". He had chills, sweats and felt achy. His appetite decreased and has since improved. As noted above he has had mild dyspnea on exertion since the "cold".  Objective:  Vital signs in last 24 hours:  Blood pressure 168/73, pulse 97, temperature 98.2 F (36.8 C), temperature source Oral, resp. rate 20, height $RemoveBe'5\' 9"'MNxvHAklq$  (1.753 m), weight 166 lb 1.6 oz (75.342 kg), SpO2 100 %.    HEENT: no thrush or ulcers. Resp: lungs clear bilaterally. Cardio: distant heart sounds. Regular rate and rhythm. GI: abdomen soft and nontender. No hepatomegaly. Vascular: no leg edema. Port-A-Cath without erythema.    Lab Results:  Lab Results  Component Value Date   WBC 8.5 11/15/2014   HGB 10.6* 11/15/2014   HCT 33.0* 11/15/2014   MCV 100.7* 11/15/2014   PLT 302 11/15/2014   NEUTROABS 7.1* 11/15/2014    Imaging:  No results found.  Medications: I have reviewed the patient's current medications.  Assessment/Plan: 1. Metastatic rectal cancer status post biopsy of rectal mass 09/03/2011 with pathology showing invasive adenocarcinoma. Microsatellite stable. Staging CT scans 09/03/2011 showed a 10 mm hypodense rounded lesion in left lateral hepatic lobe, similar 6 mm lesion in the superior right hepatic lobe and a smaller subcapsular lesion in the lateral right hepatic lobe; rectal mass within the lumen of the bowel emanating from the left wall measuring 4.9 x 3.5 cm  and a mass within the low left perirectal fat measuring 2.3 x 3.2 cm consistent with local extension of carcinoma. Staging PET scan 09/18/2011 showed a 5.1 cm distal sigmoid/rectal mass with max SUV 19.2 with local extension into the left perirectal fat with max SUV 7.7. At least 2 suspected hepatic metastases with max SUV 6.7 in the lateral segment left hepatic lobe and max SUV 4.1 in the lateral right hepatic dome. Additional tiny hypodensities in the liver may be cysts; patchy/nodular opacity in the left lower lobe with max SUV 3.5 worrisome for pulmonary metastasis, less likely infectious. He began radiation and concurrent Xeloda chemotherapy on 09/29/2011. He completed radiation on 11/06/2011. MRI of the liver on 12/08/2011 showed 2 hypermetabolic liver lesions, similar in size to 09/03/2011. 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 Equivocal 7 mm lesion in the inferior right hepatic lobe. No evidence of extrahepatic metastasis within the abdomen. An ultrasound-guided biopsy of a small left hepatic lesion on 12/23/2011 was nondiagnostic. He underwent a low anterior resection with loop ileostomy on 02/06/2012 with final pathology showing a 3.5 cm invasive adenocarcinoma with abundant extracellular mucin invading through the muscularis propria into pericolonic fatty tissue; 4 of 10 pericolonic lymph nodes were positive for metastatic carcinoma; resection margins were clear (ypT3,ypN2). K-ras mutation detected. He began FOLFOX chemotherapy on 03/16/2012. He completed 2 cycles of FOLFOX chemotherapy and underwent an ileostomy reversal 05/05/2012. FOLFOX was resumed on 06/14/2012 and completed on 09/14/2012. 2. CEA 13.8 on 09/17/2011; 1.6 on 12/01/2011; 8.4 on 03/09/2012, 2.1 on 05/03/2012;  2.5 on 07/26/2012; 1.8 on 10/12/2012. 3.0 on 11/23/2012. CEA elevated at 25.4 on 02/22/2013, 44.7 on 04/04/2013, and 59.1 on 05/17/2013. CEA improved at 16.8 on  08/10/2013 and 10.8 on 09/23/2013. CEA improved at 8.9 on 10/18/2013. 3. CT chest/abdomen/pelvis 03/10/2013 with 2 new left lower lobe nodules and 2 liver lesions increased in size. Progressive metastatic disease in the lungs and liver on a restaging CT 06/07/2013. He began treatment with FOLFIRI/Avastin on 06/21/2013.  Restaging CT 09/23/2013 after 5 cycles of FOLFIRI/Avastin, with stable lung lesions and a decrease in the size of dominant liver lesions.   Cycle 6 FOLFIRI/Avastin 09/27/2013.   Cycle 7 FOLFIRI/Avastin 10/18/2013.   Cycle 8 FOLFIRI/Avastin 11/09/2013.   Cycle 9 FOLFIRI/Avastin 12/13/2013.   Cycle 10 FOLFIRI/Avastin 01/03/2014.   Cycle 11 FOLFIRI/Avastin 01/30/2014.   Restaging CT 02/13/2014-no significant change in multiple lung/liver lesions.   Cycle 12 FOLFIRI/Avastin 02/20/2014.   Cycle 13 FOLFIRI/Avastin 03/13/2014.   Cycle 14 FOLFIRI/Avastin 04/03/2014   Cycle 15 FOLFIRI/Avastin 04/24/2014   Cycle 16 FOLFIRI/Avastin 05/16/2014.   Restaging CT evaluation 05/30/2014. No significant change in pulmonary lesions. No significant change in hepatic metastasis.   Cycle 17 5-FU/leucovorin/Avastin (irinotecan eliminated from the regimen) 06/12/2014.   Cycle 18 5-FU/leucovorin/Avastin 07/03/2014   cycle 19 5-FU/leucovorin/Avastin 07/25/2014   CEA increased 07/25/2014 and 08/15/2014.   Restaging CT evaluation 08/31/2014 with mild progression of lung and liver metastases.  FOLFIRI/Avastin resumed 10/02/2014. 4. K-ras mutation detected. 5. Rectal pain and bleeding secondary to #1. Resolved. 6. Frequent loose stools secondary to #1. Improved. 7. Hypertension. 8. Tobacco use. 9. CT scan 09/03/2011 with chronic right hydronephrosis secondary to an obstructing calculus in the mid right ureter. He is followed by Dr. Janice Norrie. Stable on the CT 06/07/2013 10. Status post cystoscopy, right retrograde pyelogram, ureteroscopy and insertion of right double-J  stent 11/28/2011. The double-J stent was replaced 02/06/2012. The stent has been removed. 11. Elevated BUN/creatinine. Likely secondary to dehydration while the ileostomy was in place. He now appears to have chronic renal insufficiency. 12. Status post ultrasound-guided fine-needle aspiration of small left hepatic lesion 12/22/2011. The pathology revealed no malignancy. 13. Malaise and weight loss. Resolved. 14. High output ileostomy. Partially improved with Imodium. Status post ileostomy reversal 05/05/2012. 15. Erectile dysfunction. He is followed by Dr. Janice Norrie. 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 17. Diarrhea after FOLFOX chemotherapy given on 07/26/2012-resolved. The bolus and infusional 5-FU were dose reduced by 20% beginning with FOLFOX on 08/17/2012.  18. History of an Irregular heart rate- Status post evaluation by Dr. Sarajane Jews on 11/02/2012 with findings of sinus rhythm with occasional PACs. 19. Diarrhea following FOLFIRI/Avastin. He reports the diarrhea is relieved with Lomotil. The diarrhea has markedly improved coinciding with discontinuation of Irinotecan. 20. "Sinus congestion". He is status post evaluation by ENT. Symptoms are better. 21. Hypokalemia 07/05/2013. He continues a potassium supplement.  22. Intermittent bilateral shoulder pain. now with increased pain at the left shoulder requiring frequent tramadol. He has been referred to orthopedics.  23. 24-hour urine 06/07/2014 with 702 mg of protein.   Disposition: [name] [name] appears stable. Plan to proceed with cycle 2 FOLFIRI/Avastin today as scheduled. He will return for a followup visit and the third cycle on 12/04/2014. He will contact the office in the interim with any problems. We specifically discussed bleeding and progressive dyspnea.  Plan reviewed with Dr. Benay Spice.    Ned Card ANP/GNP-BC   11/15/2014  9:48 AM

## 2014-11-15 NOTE — Telephone Encounter (Signed)
gv adn printed appt sched and avs for pt for DEC and Jan 2016....sed added tx and emialed MW to add tx on 12.28

## 2014-11-15 NOTE — Patient Instructions (Signed)
RETURN TO CLINIC WED 10/28 AT 12:00.     [facility] Discharge Instructions for Patients Receiving Chemotherapy  Today you received the following chemotherapy agents: Leucovorin, irinotecan, avastin, 45fu  To help prevent nausea and vomiting after your treatment, we encourage you to take your nausea medication.  Take it as often as prescribed.     If you develop nausea and vomiting that is not controlled by your nausea medication, call the clinic. If it is after clinic hours your family physician or the after hours number for the clinic or go to the Emergency Department.   BELOW ARE SYMPTOMS THAT SHOULD BE REPORTED IMMEDIATELY:  *FEVER GREATER THAN 100.5 F  *CHILLS WITH OR WITHOUT FEVER  NAUSEA AND VOMITING THAT IS NOT CONTROLLED WITH YOUR NAUSEA MEDICATION  *UNUSUAL SHORTNESS OF BREATH  *UNUSUAL BRUISING OR BLEEDING  TENDERNESS IN MOUTH AND THROAT WITH OR WITHOUT PRESENCE OF ULCERS  *URINARY PROBLEMS  *BOWEL PROBLEMS  UNUSUAL RASH Items with * indicate a potential emergency and should be followed up as soon as possible.  Feel free to call the clinic you have any questions or concerns. The clinic phone number is [phone].   I have been informed and understand all the instructions given to me. I know to contact the clinic, my physician, or go to the Emergency Department if any problems should occur. I do not have any questions at this time, but understand that I may call the clinic during office hours   should I have any questions or need assistance in obtaining follow up care.    __________________________________________  _____________  __________ Signature of Patient or Authorized Representative            Date                   Time    __________________________________________ Nurse's Signature

## 2014-11-16 ENCOUNTER — Telehealth: Payer: Self-pay | Admitting: *Deleted

## 2014-11-16 ENCOUNTER — Telehealth: Payer: Self-pay | Admitting: Oncology

## 2014-11-16 NOTE — Telephone Encounter (Signed)
Called patient with lab results. Per Elby Showers. Marcello Moores, NP.  Patient stated he had not been taking his potassium due to being sick, but stated he would start back today.  Informed Elby Showers. Marcello Moores, NP.

## 2014-11-16 NOTE — Telephone Encounter (Signed)
s.w. pt and advosed on all appt....pt ok and aware

## 2014-11-16 NOTE — Telephone Encounter (Signed)
-----   Message from Owens Shark, NP sent at 11/16/2014  1:24 PM EST ----- Please let him know potassium is mildly decreased. Is he is taking potassium 20 mEq daily? Thanks.

## 2014-11-17 ENCOUNTER — Ambulatory Visit (HOSPITAL_BASED_OUTPATIENT_CLINIC_OR_DEPARTMENT_OTHER): Payer: Medicare Other

## 2014-11-17 DIAGNOSIS — Z452 Encounter for adjustment and management of vascular access device: Secondary | ICD-10-CM

## 2014-11-17 DIAGNOSIS — C20 Malignant neoplasm of rectum: Secondary | ICD-10-CM

## 2014-11-17 MED ORDER — SODIUM CHLORIDE 0.9 % IJ SOLN
10.0000 mL | INTRAMUSCULAR | Status: DC | PRN
  Administered 2014-11-17: 10 mL
  Filled 2014-11-17: qty 10

## 2014-11-17 MED ORDER — HEPARIN SOD (PORK) LOCK FLUSH 100 UNIT/ML IV SOLN
500.0000 [IU] | Freq: Once | INTRAVENOUS | Status: AC | PRN
  Administered 2014-11-17: 500 [IU]
  Filled 2014-11-17: qty 5

## 2014-11-17 NOTE — Patient Instructions (Signed)
Fluorouracil, 5FU; Diclofenac topical cream What is this medicine? FLUOROURACIL; DICLOFENAC (flure oh YOOR a sil; dye KLOE fen ak) is a combination of a topical chemotherapy agent and non-steroidal anti-inflammatory drug (NSAID). It is used on the skin to treat skin cancer and skin conditions that could become cancer. This medicine may be used for other purposes; ask your health care provider or pharmacist if you have questions. COMMON BRAND NAME(S): FLUORAC What should I tell my health care provider before I take this medicine? They need to know if you have any of these conditions: -bleeding problems -cigarette smoker -DPD enzyme deficiency -heart disease -high blood pressure -if you frequently drink alcohol containing drinks -kidney disease -liver disease -open or infected skin -stomach problems -swelling or open sores at the treatment site -recent or planned coronary artery bypass graft (CABG) surgery -an unusual or allergic reaction to fluorouracil, diclofenac, aspirin, other NSAIDs, other medicines, foods, dyes, or preservatives -pregnant or trying to get pregnant -breast-feeding How should I use this medicine? This medicine is only for use on the skin. Follow the directions on the prescription label. Wash hands before and after use. Wash affected area and gently pat dry. To apply this medicine use a cotton-tipped applicator, or use gloves if applying with fingertips. If applied with unprotected fingertips, it is very important to wash your hands well after you apply this medicine. Avoid applying to the eyes, nose, or mouth. Apply enough medicine to cover the affected area. You can cover the area with a light gauze dressing, but do not use tight or air-tight dressings. Finish the full course prescribed by your doctor or health care professional, even if you think your condition is better. Do not stop taking except on the advice of your doctor or health care professional. Talk to your  pediatrician regarding the use of this medicine in children. Special care may be needed. Overdosage: If you think you've taken too much of this medicine contact a poison control center or emergency room at once. Overdosage: If you think you have taken too much of this medicine contact a poison control center or emergency room at once. NOTE: This medicine is only for you. Do not share this medicine with others. What if I miss a dose? If you miss a dose, apply it as soon as you can. If it is almost time for your next dose, only use that dose. Do not apply extra doses. Contact your doctor or health care professional if you miss more than one dose. What may interact with this medicine? Interactions are not expected. Do not use any other skin products without telling your doctor or health care professional. This list may not describe all possible interactions. Give your health care provider a list of all the medicines, herbs, non-prescription drugs, or dietary supplements you use. Also tell them if you smoke, drink alcohol, or use illegal drugs. Some items may interact with your medicine. What should I watch for while using this medicine? Visit your doctor or health care professional for checks on your progress. You will need to use this medicine for 2 to 6 weeks. This may be longer depending on the condition being treated. You may not see full healing for another 1 to 2 months after you stop using the medicine. Treated areas of skin can look unsightly during and for several weeks after treatment with this medicine. This medicine can make you more sensitive to the sun. Keep out of the sun. If you cannot avoid being in  the sun, wear protective clothing and use sunscreen. Do not use sun lamps or tanning beds/booths. Where should I keep my What side effects may I notice from receiving this medicine? Side effects that you should report to your doctor or health care professional as soon as possible: -allergic  reactions like skin rash, itching or hives, swelling of the face, lips, or tongue -black or bloody stools, blood in the urine or vomit -blurred vision -chest pain -difficulty breathing or wheezing -redness, blistering, peeling or loosening of the skin, including inside the mouth -severe redness and swelling of normal skin -slurred speech or weakness on one side of the body -trouble passing urine or change in the amount of urine -unexplained weight gain or swelling -unusually weak or tired -yellowing of eyes or skin Side effects that usually do not require medical attention (Report these to your doctor or health care professional if they continue or are bothersome.): -increased sensitivity of the skin to sun and ultraviolet light -pain and burning of the affected area -scaling or swelling of the affected area -skin rash, itching of the affected area -tenderness This list may not describe all possible side effects. Call your doctor for medical advice about side effects. You may report side effects to FDA at 1-800-FDA-1088. Where should I keep my medicine? Keep out of the reach of children. Store at room temperature between 20 and 25 degrees C (68 and 77 degrees F). Throw away any unused medicine after the expiration date. NOTE: This sheet is a summary. It may not cover all possible information. If you have questions about this medicine, talk to your doctor, pharmacist, or health care provider.  2015, [employer]. (2014-03-27 11:09:58)

## 2014-11-22 LAB — BASIC METABOLIC PANEL
BUN: 18 mg/dL (ref 4–21)
Creatinine: 1.4 mg/dL — AB (ref <=1.3)
Glucose: 127 mg/dL
Potassium: 3.5 mmol/L (ref 3.4–5.3)
Sodium: 141 mmol/L (ref 137–147)

## 2014-11-28 ENCOUNTER — Encounter: Payer: Self-pay | Admitting: Family Medicine

## 2014-12-02 ENCOUNTER — Other Ambulatory Visit: Payer: Self-pay | Admitting: Oncology

## 2014-12-04 ENCOUNTER — Ambulatory Visit (HOSPITAL_BASED_OUTPATIENT_CLINIC_OR_DEPARTMENT_OTHER): Payer: Medicare Other | Admitting: Oncology

## 2014-12-04 ENCOUNTER — Other Ambulatory Visit (HOSPITAL_BASED_OUTPATIENT_CLINIC_OR_DEPARTMENT_OTHER): Payer: Medicare Other

## 2014-12-04 ENCOUNTER — Telehealth: Payer: Self-pay | Admitting: Oncology

## 2014-12-04 ENCOUNTER — Telehealth: Payer: Self-pay | Admitting: Nurse Practitioner

## 2014-12-04 VITALS — BP 162/82 | HR 94 | Temp 98.3°F | Resp 19 | Ht 69.0 in | Wt 163.1 lb

## 2014-12-04 DIAGNOSIS — Z720 Tobacco use: Secondary | ICD-10-CM

## 2014-12-04 DIAGNOSIS — C20 Malignant neoplasm of rectum: Secondary | ICD-10-CM

## 2014-12-04 DIAGNOSIS — C7800 Secondary malignant neoplasm of unspecified lung: Secondary | ICD-10-CM

## 2014-12-04 DIAGNOSIS — I10 Essential (primary) hypertension: Secondary | ICD-10-CM

## 2014-12-04 DIAGNOSIS — C787 Secondary malignant neoplasm of liver and intrahepatic bile duct: Secondary | ICD-10-CM

## 2014-12-04 DIAGNOSIS — C7802 Secondary malignant neoplasm of left lung: Secondary | ICD-10-CM

## 2014-12-04 DIAGNOSIS — G620 Drug-induced polyneuropathy: Secondary | ICD-10-CM

## 2014-12-04 LAB — COMPREHENSIVE METABOLIC PANEL (CC13)
ALK PHOS: 135 U/L (ref 40–150)
ALT: 14 U/L (ref 0–55)
AST: 17 U/L (ref 5–34)
Albumin: 2.5 g/dL — ABNORMAL LOW (ref 3.5–5.0)
Anion Gap: 10 mEq/L (ref 3–11)
BILIRUBIN TOTAL: 0.44 mg/dL (ref 0.20–1.20)
BUN: 21.9 mg/dL (ref 7.0–26.0)
CO2: 23 meq/L (ref 22–29)
Calcium: 8.3 mg/dL — ABNORMAL LOW (ref 8.4–10.4)
Chloride: 108 mEq/L (ref 98–109)
Creatinine: 1.4 mg/dL — ABNORMAL HIGH (ref 0.7–1.3)
EGFR: 61 mL/min/{1.73_m2} — ABNORMAL LOW (ref >90)
Glucose: 97 mg/dl (ref 70–140)
Potassium: 3.5 mEq/L (ref 3.5–5.1)
SODIUM: 142 meq/L (ref 136–145)
Total Protein: 6.6 g/dL (ref 6.4–8.3)

## 2014-12-04 LAB — CBC WITH DIFFERENTIAL/PLATELET
BASO%: 0.6 % (ref 0.0–2.0)
Basophils Absolute: 0 10*3/uL (ref 0.0–0.1)
EOS%: 2.1 % (ref 0.0–7.0)
Eosinophils Absolute: 0.1 10*3/uL (ref 0.0–0.5)
HEMATOCRIT: 33.8 % — AB (ref 38.4–49.9)
HGB: 11 g/dL — ABNORMAL LOW (ref 13.0–17.1)
LYMPH%: 15.3 % (ref 14.0–49.0)
MCH: 32.5 pg (ref 27.2–33.4)
MCHC: 32.5 g/dL (ref 32.0–36.0)
MCV: 100.2 fL — ABNORMAL HIGH (ref 79.3–98.0)
MONO#: 0.6 10*3/uL (ref 0.1–0.9)
MONO%: 10.3 % (ref 0.0–14.0)
NEUT%: 71.7 % (ref 39.0–75.0)
NEUTROS ABS: 4 10*3/uL (ref 1.5–6.5)
PLATELETS: 245 10*3/uL (ref 140–400)
RBC: 3.38 10*6/uL — AB (ref 4.20–5.82)
RDW: 17.5 % — ABNORMAL HIGH (ref 11.0–14.6)
WBC: 5.6 10*3/uL (ref 4.0–10.3)
lymph#: 0.9 10*3/uL (ref 0.9–3.3)

## 2014-12-04 NOTE — Telephone Encounter (Signed)
LM to confirm time for 12/05/14.

## 2014-12-04 NOTE — Telephone Encounter (Signed)
Pt confirmed labs/ov per 12/28 POF, gave pt AVS..... KJ

## 2014-12-04 NOTE — Progress Notes (Signed)
[facility] OFFICE PROGRESS NOTE   Diagnosis: Rectal cancer  INTERVAL HISTORY:   [name] [name] returns as scheduled. He completed another cycle of FOLFIRI/Avastin on 11/15/2014. He had emesis on day 2. He reports less diarrhea following this cycle of chemotherapy. He notes soreness surrounding the lower aspect of the abdominal incision. He is working.  Objective:  Vital signs in last 24 hours:  Blood pressure 162/82, pulse 94, temperature 98.3 F (36.8 C), temperature source Oral, resp. rate 19, height 5\' 9"  (1.753 m), weight 163 lb 1.6 oz (73.982 kg), SpO2 98 %.    HEENT: No thrush or ulcers Resp: Lungs clear bilaterally Cardio: Regular rate and rhythm GI: No hepatomegaly, soft, suture material palpable at the midline abdominal scar, no mass, nontender Vascular: No leg edema   Portacath/PICC-without erythema  Lab Results:  Lab Results  Component Value Date   WBC 5.6 12/04/2014   HGB 11.0* 12/04/2014   HCT 33.8* 12/04/2014   MCV 100.2* 12/04/2014   PLT 245 12/04/2014   NEUTROABS 4.0 12/04/2014     Lab Results  Component Value Date   CEA 51.0* 10/02/2014   Medications: I have reviewed the patient's current medications.  Assessment/Plan: 1. Metastatic rectal cancer status post biopsy of rectal mass 09/03/2011 with pathology showing invasive adenocarcinoma. Microsatellite stable. Staging CT scans 09/03/2011 showed a 10 mm hypodense rounded lesion in left lateral hepatic lobe, similar 6 mm lesion in the superior right hepatic lobe and a smaller subcapsular lesion in the lateral right hepatic lobe; rectal mass within the lumen of the bowel emanating from the left wall measuring 4.9 x 3.5 cm and a mass within the low left perirectal fat measuring 2.3 x 3.2 cm consistent with local extension of carcinoma. Staging PET scan 09/18/2011 showed a 5.1 cm distal sigmoid/rectal mass with max SUV 19.2 with local extension into the left perirectal fat with max SUV 7.7. At  least 2 suspected hepatic metastases with max SUV 6.7 in the lateral segment left hepatic lobe and max SUV 4.1 in the lateral right hepatic dome. Additional tiny hypodensities in the liver may be cysts; patchy/nodular opacity in the left lower lobe with max SUV 3.5 worrisome for pulmonary metastasis, less likely infectious. He began radiation and concurrent Xeloda chemotherapy on 09/29/2011. He completed radiation on 11/06/2011. MRI of the liver on 12/08/2011 showed 2 hypermetabolic liver lesions, similar in size to 09/03/2011. 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 Equivocal 7 mm lesion in the inferior right hepatic lobe. No evidence of extrahepatic metastasis within the abdomen. An ultrasound-guided biopsy of a small left hepatic lesion on 12/23/2011 was nondiagnostic. He underwent a low anterior resection with loop ileostomy on 02/06/2012 with final pathology showing a 3.5 cm invasive adenocarcinoma with abundant extracellular mucin invading through the muscularis propria into pericolonic fatty tissue; 4 of 10 pericolonic lymph nodes were positive for metastatic carcinoma; resection margins were clear (ypT3,ypN2). K-ras mutation detected. He began FOLFOX chemotherapy on 03/16/2012. He completed 2 cycles of FOLFOX chemotherapy and underwent an ileostomy reversal 05/05/2012. FOLFOX was resumed on 06/14/2012 and completed on 09/14/2012. 2. CEA 13.8 on 09/17/2011; 1.6 on 12/01/2011; 8.4 on 03/09/2012, 2.1 on 05/03/2012; 2.5 on 07/26/2012; 1.8 on 10/12/2012. 3.0 on 11/23/2012. CEA elevated at 25.4 on 02/22/2013, 44.7 on 04/04/2013, and 59.1 on 05/17/2013. CEA improved at 16.8 on 08/10/2013 and 10.8 on 09/23/2013. CEA improved at 8.9 on 10/18/2013. 3. CT chest/abdomen/pelvis 03/10/2013 with 2 new left lower lobe nodules  and 2 liver lesions increased in size. Progressive metastatic disease in the lungs and liver on a restaging CT 06/07/2013. He began  treatment with FOLFIRI/Avastin on 06/21/2013.  Restaging CT 09/23/2013 after 5 cycles of FOLFIRI/Avastin, with stable lung lesions and a decrease in the size of dominant liver lesions.   Cycle 6 FOLFIRI/Avastin 09/27/2013.   Cycle 7 FOLFIRI/Avastin 10/18/2013.   Cycle 8 FOLFIRI/Avastin 11/09/2013.   Cycle 9 FOLFIRI/Avastin 12/13/2013.   Cycle 10 FOLFIRI/Avastin 01/03/2014.   Cycle 11 FOLFIRI/Avastin 01/30/2014.   Restaging CT 02/13/2014-no significant change in multiple lung/liver lesions.   Cycle 12 FOLFIRI/Avastin 02/20/2014.   Cycle 13 FOLFIRI/Avastin 03/13/2014.   Cycle 14 FOLFIRI/Avastin 04/03/2014   Cycle 15 FOLFIRI/Avastin 04/24/2014   Cycle 16 FOLFIRI/Avastin 05/16/2014.   Restaging CT evaluation 05/30/2014. No significant change in pulmonary lesions. No significant change in hepatic metastasis.   Cycle 17 5-FU/leucovorin/Avastin (irinotecan eliminated from the regimen) 06/12/2014.   Cycle 18 5-FU/leucovorin/Avastin 07/03/2014   cycle 19 5-FU/leucovorin/Avastin 07/25/2014   CEA increased 07/25/2014 and 08/15/2014.   Restaging CT evaluation 08/31/2014 with mild progression of lung and liver metastases.  FOLFIRI/Avastin resumed 10/02/2014. 4. K-ras mutation detected. 5. Rectal pain and bleeding secondary to #1. Resolved. 6. Frequent loose stools secondary to #1. Improved. 7. Hypertension. 8. Tobacco use. 9. CT scan 09/03/2011 with chronic right hydronephrosis secondary to an obstructing calculus in the mid right ureter. He is followed by Dr. Janice Norrie. Stable on the CT 06/07/2013 10. Status post cystoscopy, right retrograde pyelogram, ureteroscopy and insertion of right double-J stent 11/28/2011. The double-J stent was replaced 02/06/2012. The stent has been removed. 11. Elevated BUN/creatinine. Likely secondary to dehydration while the ileostomy was in place. He now appears to have chronic renal insufficiency. 12. Status post ultrasound-guided  fine-needle aspiration of small left hepatic lesion 12/22/2011. The pathology revealed no malignancy. 13. Malaise and weight loss. Resolved. 14. High output ileostomy. Partially improved with Imodium. Status post ileostomy reversal 05/05/2012. 15. Erectile dysfunction. He is followed by Dr. Janice Norrie. 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 17. Diarrhea after FOLFOX chemotherapy given on 07/26/2012-resolved. The bolus and infusional 5-FU were dose reduced by 20% beginning with FOLFOX on 08/17/2012.  18. History of an Irregular heart rate- Status post evaluation by Dr. Sarajane Jews on 11/02/2012 with findings of sinus rhythm with occasional PACs. 19. Diarrhea following FOLFIRI/Avastin. He reports the diarrhea is relieved with Lomotil. The diarrhea has markedly improved coinciding with discontinuation of Irinotecan. 20. "Sinus congestion". He is status post evaluation by ENT. Symptoms are better. 21. Hypokalemia 07/05/2013. He continues a potassium supplement.  22. Intermittent bilateral shoulder pain. now with increased pain at the left shoulder requiring frequent tramadol. He has been referred to orthopedics.  23. 24-hour urine 06/07/2014 with 702 mg of protein.  Disposition:  Mr. Shabazz appears stable. He will complete another cycle of FOLFIRI/Avastin beginning 12/05/2014. We will check the CEA on 12/05/2014 and when he returns in 3 weeks. He did not wish to adjust the anti-emetic regimen.  [name] Coder, MD  12/04/2014  9:28 AM

## 2014-12-05 ENCOUNTER — Other Ambulatory Visit (HOSPITAL_BASED_OUTPATIENT_CLINIC_OR_DEPARTMENT_OTHER): Payer: Medicare Other

## 2014-12-05 ENCOUNTER — Ambulatory Visit (HOSPITAL_BASED_OUTPATIENT_CLINIC_OR_DEPARTMENT_OTHER): Payer: Medicare Other

## 2014-12-05 ENCOUNTER — Other Ambulatory Visit: Payer: Medicare Other

## 2014-12-05 DIAGNOSIS — C20 Malignant neoplasm of rectum: Secondary | ICD-10-CM

## 2014-12-05 DIAGNOSIS — Z5111 Encounter for antineoplastic chemotherapy: Secondary | ICD-10-CM

## 2014-12-05 DIAGNOSIS — C787 Secondary malignant neoplasm of liver and intrahepatic bile duct: Secondary | ICD-10-CM

## 2014-12-05 DIAGNOSIS — Z5112 Encounter for antineoplastic immunotherapy: Secondary | ICD-10-CM

## 2014-12-05 DIAGNOSIS — C7800 Secondary malignant neoplasm of unspecified lung: Secondary | ICD-10-CM

## 2014-12-05 DIAGNOSIS — I10 Essential (primary) hypertension: Secondary | ICD-10-CM

## 2014-12-05 LAB — UA PROTEIN, DIPSTICK - CHCC: Protein, ur: 300 mg/dL

## 2014-12-05 LAB — CEA: CEA: 67.5 ng/mL — ABNORMAL HIGH (ref 0.0–5.0)

## 2014-12-05 MED ORDER — IRINOTECAN HCL CHEMO INJECTION 100 MG/5ML
90.0000 mg/m2 | Freq: Once | INTRAVENOUS | Status: AC
  Administered 2014-12-05: 182 mg via INTRAVENOUS
  Filled 2014-12-05: qty 9.1

## 2014-12-05 MED ORDER — DEXAMETHASONE SODIUM PHOSPHATE 20 MG/5ML IJ SOLN
20.0000 mg | Freq: Once | INTRAMUSCULAR | Status: AC
  Administered 2014-12-05: 20 mg via INTRAVENOUS

## 2014-12-05 MED ORDER — LEUCOVORIN CALCIUM INJECTION 350 MG
320.0000 mg/m2 | Freq: Once | INTRAVENOUS | Status: AC
  Administered 2014-12-05: 646 mg via INTRAVENOUS
  Filled 2014-12-05: qty 32.3

## 2014-12-05 MED ORDER — SODIUM CHLORIDE 0.9 % IV SOLN
5.0000 mg/kg | Freq: Once | INTRAVENOUS | Status: AC
  Administered 2014-12-05: 375 mg via INTRAVENOUS
  Filled 2014-12-05: qty 15

## 2014-12-05 MED ORDER — DEXAMETHASONE SODIUM PHOSPHATE 20 MG/5ML IJ SOLN
INTRAMUSCULAR | Status: AC
  Filled 2014-12-05: qty 5

## 2014-12-05 MED ORDER — PALONOSETRON HCL INJECTION 0.25 MG/5ML
INTRAVENOUS | Status: AC
  Filled 2014-12-05: qty 5

## 2014-12-05 MED ORDER — PALONOSETRON HCL INJECTION 0.25 MG/5ML
0.2500 mg | Freq: Once | INTRAVENOUS | Status: AC
  Administered 2014-12-05: 0.25 mg via INTRAVENOUS

## 2014-12-05 MED ORDER — FLUOROURACIL CHEMO INJECTION 2.5 GM/50ML
320.0000 mg/m2 | Freq: Once | INTRAVENOUS | Status: AC
  Administered 2014-12-05: 650 mg via INTRAVENOUS
  Filled 2014-12-05: qty 13

## 2014-12-05 MED ORDER — SODIUM CHLORIDE 0.9 % IV SOLN
1920.0000 mg/m2 | INTRAVENOUS | Status: DC
  Administered 2014-12-05: 3900 mg via INTRAVENOUS
  Filled 2014-12-05: qty 78

## 2014-12-05 MED ORDER — SODIUM CHLORIDE 0.9 % IV SOLN
Freq: Once | INTRAVENOUS | Status: AC
  Administered 2014-12-05: 10:00:00 via INTRAVENOUS

## 2014-12-05 NOTE — Patient Instructions (Signed)
[facility] Discharge Instructions for Patients Receiving Chemotherapy  Today you received the following chemotherapy agents Foliri Avastin To help prevent nausea and vomiting after your treatment, we encourage you to take your nausea medication as directed/prescribed   If you develop nausea and vomiting that is not controlled by your nausea medication, call the clinic.   BELOW ARE SYMPTOMS THAT SHOULD BE REPORTED IMMEDIATELY:  *FEVER GREATER THAN 100.5 F  *CHILLS WITH OR WITHOUT FEVER  NAUSEA AND VOMITING THAT IS NOT CONTROLLED WITH YOUR NAUSEA MEDICATION  *UNUSUAL SHORTNESS OF BREATH  *UNUSUAL BRUISING OR BLEEDING  TENDERNESS IN MOUTH AND THROAT WITH OR WITHOUT PRESENCE OF ULCERS  *URINARY PROBLEMS  *BOWEL PROBLEMS  UNUSUAL RASH Items with * indicate a potential emergency and should be followed up as soon as possible.  Feel free to call the clinic you have any questions or concerns. The clinic phone number is [phone].

## 2014-12-06 ENCOUNTER — Telehealth: Payer: Self-pay | Admitting: *Deleted

## 2014-12-06 DIAGNOSIS — C7800 Secondary malignant neoplasm of unspecified lung: Secondary | ICD-10-CM

## 2014-12-06 DIAGNOSIS — C20 Malignant neoplasm of rectum: Secondary | ICD-10-CM

## 2014-12-06 NOTE — Telephone Encounter (Signed)
Per Dr. Benay Spice; notified pt that CEA is higher and schedulers will be in contact with date/time to schedule CT, chest, abd, pelvis prior to 1/18; and will get labs checked same day.  Pt verbalized understanding of information.

## 2014-12-06 NOTE — Telephone Encounter (Signed)
-----   Message from Ladell Pier, MD sent at 12/05/2014  6:27 PM EST ----- Please call patient, cea is higer, scheduled CTs chest, abd, pelvis prior to next visit with contrast, check bmet day of CT

## 2014-12-07 ENCOUNTER — Ambulatory Visit (HOSPITAL_BASED_OUTPATIENT_CLINIC_OR_DEPARTMENT_OTHER): Payer: Medicare Other

## 2014-12-07 DIAGNOSIS — C20 Malignant neoplasm of rectum: Secondary | ICD-10-CM

## 2014-12-07 DIAGNOSIS — C787 Secondary malignant neoplasm of liver and intrahepatic bile duct: Secondary | ICD-10-CM

## 2014-12-07 DIAGNOSIS — C7800 Secondary malignant neoplasm of unspecified lung: Secondary | ICD-10-CM

## 2014-12-07 MED ORDER — SODIUM CHLORIDE 0.9 % IJ SOLN
10.0000 mL | INTRAMUSCULAR | Status: DC | PRN
  Administered 2014-12-07: 10 mL
  Filled 2014-12-07: qty 10

## 2014-12-07 MED ORDER — HEPARIN SOD (PORK) LOCK FLUSH 100 UNIT/ML IV SOLN
500.0000 [IU] | Freq: Once | INTRAVENOUS | Status: AC | PRN
  Administered 2014-12-07: 500 [IU]
  Filled 2014-12-07: qty 5

## 2014-12-13 ENCOUNTER — Ambulatory Visit (HOSPITAL_COMMUNITY): Payer: Medicare Other

## 2014-12-19 ENCOUNTER — Ambulatory Visit (HOSPITAL_COMMUNITY)
Admission: RE | Admit: 2014-12-19 | Discharge: 2014-12-19 | Disposition: A | Discharge status: Home / Self Care | Payer: Medicare Other | Source: Ambulatory Visit | Attending: Oncology | Admitting: Oncology

## 2014-12-19 ENCOUNTER — Encounter (HOSPITAL_COMMUNITY): Payer: Self-pay

## 2014-12-19 DIAGNOSIS — R1031 Right lower quadrant pain: Secondary | ICD-10-CM | POA: Diagnosis not present

## 2014-12-19 DIAGNOSIS — Z923 Personal history of irradiation: Secondary | ICD-10-CM | POA: Diagnosis not present

## 2014-12-19 DIAGNOSIS — R634 Abnormal weight loss: Secondary | ICD-10-CM | POA: Insufficient documentation

## 2014-12-19 DIAGNOSIS — C7800 Secondary malignant neoplasm of unspecified lung: Secondary | ICD-10-CM

## 2014-12-19 DIAGNOSIS — C7802 Secondary malignant neoplasm of left lung: Secondary | ICD-10-CM | POA: Diagnosis not present

## 2014-12-19 DIAGNOSIS — C787 Secondary malignant neoplasm of liver and intrahepatic bile duct: Secondary | ICD-10-CM | POA: Diagnosis not present

## 2014-12-19 DIAGNOSIS — C20 Malignant neoplasm of rectum: Secondary | ICD-10-CM | POA: Insufficient documentation

## 2014-12-19 MED ORDER — IOHEXOL 300 MG/ML  SOLN
100.0000 mL | Freq: Once | INTRAMUSCULAR | Status: AC | PRN
  Administered 2014-12-19: 100 mL via INTRAVENOUS

## 2014-12-25 ENCOUNTER — Ambulatory Visit (HOSPITAL_BASED_OUTPATIENT_CLINIC_OR_DEPARTMENT_OTHER): Payer: Medicare Other | Admitting: Oncology

## 2014-12-25 ENCOUNTER — Telehealth: Payer: Self-pay | Admitting: Oncology

## 2014-12-25 ENCOUNTER — Ambulatory Visit: Payer: Medicare Other

## 2014-12-25 ENCOUNTER — Other Ambulatory Visit (HOSPITAL_BASED_OUTPATIENT_CLINIC_OR_DEPARTMENT_OTHER): Payer: Medicare Other

## 2014-12-25 VITALS — BP 132/75 | HR 99 | Temp 97.9°F | Resp 18 | Ht 69.0 in | Wt 164.9 lb

## 2014-12-25 DIAGNOSIS — E876 Hypokalemia: Secondary | ICD-10-CM | POA: Diagnosis not present

## 2014-12-25 DIAGNOSIS — C787 Secondary malignant neoplasm of liver and intrahepatic bile duct: Secondary | ICD-10-CM | POA: Diagnosis not present

## 2014-12-25 DIAGNOSIS — C20 Malignant neoplasm of rectum: Secondary | ICD-10-CM

## 2014-12-25 DIAGNOSIS — C7802 Secondary malignant neoplasm of left lung: Secondary | ICD-10-CM

## 2014-12-25 DIAGNOSIS — G620 Drug-induced polyneuropathy: Secondary | ICD-10-CM | POA: Diagnosis not present

## 2014-12-25 LAB — CBC WITH DIFFERENTIAL/PLATELET
BASO%: 0.3 % (ref 0.0–2.0)
Basophils Absolute: 0 10*3/uL (ref 0.0–0.1)
EOS%: 2.3 % (ref 0.0–7.0)
Eosinophils Absolute: 0.1 10*3/uL (ref 0.0–0.5)
HCT: 35 % — ABNORMAL LOW (ref 38.4–49.9)
HGB: 11.4 g/dL — ABNORMAL LOW (ref 13.0–17.1)
LYMPH%: 14.9 % (ref 14.0–49.0)
MCH: 32.5 pg (ref 27.2–33.4)
MCHC: 32.6 g/dL (ref 32.0–36.0)
MCV: 99.7 fL — AB (ref 79.3–98.0)
MONO#: 0.6 10*3/uL (ref 0.1–0.9)
MONO%: 14.9 % — AB (ref 0.0–14.0)
NEUT#: 2.6 10*3/uL (ref 1.5–6.5)
NEUT%: 67.6 % (ref 39.0–75.0)
Platelets: 215 10*3/uL (ref 140–400)
RBC: 3.51 10*6/uL — ABNORMAL LOW (ref 4.20–5.82)
RDW: 16.3 % — AB (ref 11.0–14.6)
WBC: 3.9 10*3/uL — AB (ref 4.0–10.3)
lymph#: 0.6 10*3/uL — ABNORMAL LOW (ref 0.9–3.3)

## 2014-12-25 LAB — COMPREHENSIVE METABOLIC PANEL (CC13)
ALT: 9 U/L (ref 0–55)
ANION GAP: 12 meq/L — AB (ref 3–11)
AST: 17 U/L (ref 5–34)
Albumin: 2.9 g/dL — ABNORMAL LOW (ref 3.5–5.0)
Alkaline Phosphatase: 137 U/L (ref 40–150)
BUN: 22.6 mg/dL (ref 7.0–26.0)
CO2: 25 mEq/L (ref 22–29)
Calcium: 8.3 mg/dL — ABNORMAL LOW (ref 8.4–10.4)
Chloride: 107 mEq/L (ref 98–109)
Creatinine: 1.7 mg/dL — ABNORMAL HIGH (ref 0.7–1.3)
EGFR: 48 mL/min/{1.73_m2} — ABNORMAL LOW (ref >90)
Glucose: 125 mg/dl (ref 70–140)
POTASSIUM: 3.2 meq/L — AB (ref 3.5–5.1)
Sodium: 144 mEq/L (ref 136–145)
TOTAL PROTEIN: 6.7 g/dL (ref 6.4–8.3)
Total Bilirubin: 0.42 mg/dL (ref 0.20–1.20)

## 2014-12-25 LAB — CEA: CEA: 74.1 ng/mL — ABNORMAL HIGH (ref 0.0–5.0)

## 2014-12-25 NOTE — Telephone Encounter (Signed)
Gave avs & cal for Feb. °

## 2014-12-25 NOTE — Progress Notes (Signed)
[facility] OFFICE PROGRESS NOTE   Diagnosis:  Rectal cancer  INTERVAL HISTORY:    He returns as scheduled. He completed another cycle of FOLFIRI/Avastin on 12/05/2014. He reports intermittent discomfort at the right lower abdomen. No consistent pain. No bleeding or symptom of thrombosis.  Objective:  Vital signs in last 24 hours:  Blood pressure 132/75, pulse 99, temperature 97.9 F (36.6 C), temperature source Oral, resp. rate 18, height $RemoveBe'5\' 9"'LZIFUKxFX$  (1.753 m), weight 164 lb 14.4 oz (74.798 kg), SpO2 99 %.    HEENT:  No thrush or ulcers Resp:  Lungs clear bilaterally Cardio:  Regular rate and rhythm GI:  No hepatomegaly, no mass, nontender Vascular:  No leg edema    Portacath/PICC-without erythema  Lab Results:  Lab Results  Component Value Date   WBC 3.9* 12/25/2014   HGB 11.4* 12/25/2014   HCT 35.0* 12/25/2014   MCV 99.7* 12/25/2014   PLT 215 12/25/2014   NEUTROABS 2.6 12/25/2014    potassium 3.2, creatinine 1.7  Lab Results  Component Value Date   CEA 74.1* 12/25/2014    Imaging:   CTs of the chest, abdomen, and pelvis on 12/19/2014- progression of lung and liver metastases. , probable new peritoneal metastases  Medications: I have reviewed the patient's current medications.  Assessment/Plan: 1. Metastatic rectal cancer status post biopsy of rectal mass 09/03/2011 with pathology showing invasive adenocarcinoma. Microsatellite stable. Staging CT scans 09/03/2011 showed a 10 mm hypodense rounded lesion in left lateral hepatic lobe, similar 6 mm lesion in the superior right hepatic lobe and a smaller subcapsular lesion in the lateral right hepatic lobe; rectal mass within the lumen of the bowel emanating from the left wall measuring 4.9 x 3.5 cm and a mass within the low left perirectal fat measuring 2.3 x 3.2 cm consistent with local extension of carcinoma. Staging PET scan 09/18/2011 showed a 5.1 cm distal sigmoid/rectal mass with max SUV 19.2 with  local extension into the left perirectal fat with max SUV 7.7. At least 2 suspected hepatic metastases with max SUV 6.7 in the lateral segment left hepatic lobe and max SUV 4.1 in the lateral right hepatic dome. Additional tiny hypodensities in the liver may be cysts; patchy/nodular opacity in the left lower lobe with max SUV 3.5 worrisome for pulmonary metastasis, less likely infectious. He began radiation and concurrent Xeloda chemotherapy on 09/29/2011. He completed radiation on 11/06/2011. MRI of the liver on 12/08/2011 showed 2 hypermetabolic liver lesions, similar in size to 09/03/2011. 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 Equivocal 7 mm lesion in the inferior right hepatic lobe. No evidence of extrahepatic metastasis within the abdomen. An ultrasound-guided biopsy of a small left hepatic lesion on 12/23/2011 was nondiagnostic. He underwent a low anterior resection with loop ileostomy on 02/06/2012 with final pathology showing a 3.5 cm invasive adenocarcinoma with abundant extracellular mucin invading through the muscularis propria into pericolonic fatty tissue; 4 of 10 pericolonic lymph nodes were positive for metastatic carcinoma; resection margins were clear (ypT3,ypN2). K-ras mutation detected. He began FOLFOX chemotherapy on 03/16/2012. He completed 2 cycles of FOLFOX chemotherapy and underwent an ileostomy reversal 05/05/2012. FOLFOX was resumed on 06/14/2012 and completed on 09/14/2012. 2. CEA 13.8 on 09/17/2011; 1.6 on 12/01/2011; 8.4 on 03/09/2012, 2.1 on 05/03/2012; 2.5 on 07/26/2012; 1.8 on 10/12/2012. 3.0 on 11/23/2012. CEA elevated at 25.4 on 02/22/2013, 44.7 on 04/04/2013, and 59.1 on 05/17/2013. CEA improved at 16.8 on 08/10/2013 and 10.8 on 09/23/2013. CEA  improved at 8.9 on 10/18/2013. 3. CT chest/abdomen/pelvis 03/10/2013 with 2 new left lower lobe nodules and 2 liver lesions increased in size. Progressive metastatic disease  in the lungs and liver on a restaging CT 06/07/2013. He began treatment with FOLFIRI/Avastin on 06/21/2013.  Restaging CT 09/23/2013 after 5 cycles of FOLFIRI/Avastin, with stable lung lesions and a decrease in the size of dominant liver lesions.   Cycle 6 FOLFIRI/Avastin 09/27/2013.   Cycle 7 FOLFIRI/Avastin 10/18/2013.   Cycle 8 FOLFIRI/Avastin 11/09/2013.   Cycle 9 FOLFIRI/Avastin 12/13/2013.   Cycle 10 FOLFIRI/Avastin 01/03/2014.   Cycle 11 FOLFIRI/Avastin 01/30/2014.   Restaging CT 02/13/2014-no significant change in multiple lung/liver lesions.   Cycle 12 FOLFIRI/Avastin 02/20/2014.   Cycle 13 FOLFIRI/Avastin 03/13/2014.   Cycle 14 FOLFIRI/Avastin 04/03/2014   Cycle 15 FOLFIRI/Avastin 04/24/2014   Cycle 16 FOLFIRI/Avastin 05/16/2014.   Restaging CT evaluation 05/30/2014. No significant change in pulmonary lesions. No significant change in hepatic metastasis.   Cycle 17 5-FU/leucovorin/Avastin (irinotecan eliminated from the regimen) 06/12/2014.   Cycle 18 5-FU/leucovorin/Avastin 07/03/2014   cycle 19 5-FU/leucovorin/Avastin 07/25/2014   CEA increased 07/25/2014 and 08/15/2014.   Restaging CT evaluation 08/31/2014 with mild progression of lung and liver metastases.  FOLFIRI/Avastin resumed 10/02/2014.   Restaging CTs 12/19/2014 with progressive disease in the lungs , liver, and probable new peritoneal metastases 4. K-ras mutation detected. 5. Rectal pain and bleeding secondary to #1. Resolved. 6. Frequent loose stools secondary to #1. Improved. 7. Hypertension. 8. Tobacco use. 9. CT scan 09/03/2011 with chronic right hydronephrosis secondary to an obstructing calculus in the mid right ureter. He is followed by Dr. Janice Norrie. Stable on the CT 06/07/2013 10. Status post cystoscopy, right retrograde pyelogram, ureteroscopy and insertion of right double-J stent 11/28/2011. The double-J stent was replaced 02/06/2012. The stent has been  removed. 11. Elevated BUN/creatinine. Likely secondary to dehydration while the ileostomy was in place. He now appears to have chronic renal insufficiency. 12. Status post ultrasound-guided fine-needle aspiration of small left hepatic lesion 12/22/2011. The pathology revealed no malignancy. 13. Malaise and weight loss. Resolved. 14. High output ileostomy. Partially improved with Imodium. Status post ileostomy reversal 05/05/2012. 15. Erectile dysfunction. He is followed by Dr. Janice Norrie. 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 17. Diarrhea after FOLFOX chemotherapy given on 07/26/2012-resolved. The bolus and infusional 5-FU were dose reduced by 20% beginning with FOLFOX on 08/17/2012.  18. History of an Irregular heart rate- Status post evaluation by Dr. Sarajane Jews on 11/02/2012 with findings of sinus rhythm with occasional PACs. 19. Diarrhea following FOLFIRI/Avastin. He reports the diarrhea is relieved with Lomotil. The diarrhea has markedly improved coinciding with discontinuation of Irinotecan. 20. "Sinus congestion". He is status post evaluation by ENT. Symptoms are better. 21. Hypokalemia  - he continues a potassium supplement 22. Intermittent bilateral shoulder pain. now with increased pain at the left shoulder requiring frequent tramadol. He has been referred to orthopedics.  23. 24-hour urine 06/07/2014 with 702 mg of protein.   Disposition:   Mr. Stipes appears stable. The CEA is higher and the restaging CT is consistent with disease progression. We discontinued FOLFIRI/Avastin. I will refer him back to Dr. Reynaldo Minium to see if he is eligible for a clinical trial.   Mr.  Cocuzza will double the potassium dose for the next few days and then continue potassium once daily. He will return for an office and lab visit 01/12/2015.  Betsy Coder, MD  12/25/2014  9:14 PM

## 2014-12-25 NOTE — Telephone Encounter (Signed)
Gave avs & cal for Feb. Sent mess to HIM in reguards to referral to sch with Dr.Strickler.

## 2014-12-26 ENCOUNTER — Telehealth: Payer: Self-pay | Admitting: Oncology

## 2014-12-26 NOTE — Telephone Encounter (Signed)
Pt appt see Dr. Reynaldo Minium is 01/11/15@4 :00. Faxed medical records. pt is aware.

## 2015-01-03 DIAGNOSIS — N200 Calculus of kidney: Secondary | ICD-10-CM | POA: Diagnosis not present

## 2015-01-03 DIAGNOSIS — N201 Calculus of ureter: Secondary | ICD-10-CM | POA: Diagnosis not present

## 2015-01-03 DIAGNOSIS — N1330 Unspecified hydronephrosis: Secondary | ICD-10-CM | POA: Diagnosis not present

## 2015-01-12 ENCOUNTER — Telehealth: Payer: Self-pay | Admitting: Nurse Practitioner

## 2015-01-12 ENCOUNTER — Ambulatory Visit (HOSPITAL_BASED_OUTPATIENT_CLINIC_OR_DEPARTMENT_OTHER): Payer: Medicare Other

## 2015-01-12 ENCOUNTER — Other Ambulatory Visit (HOSPITAL_BASED_OUTPATIENT_CLINIC_OR_DEPARTMENT_OTHER): Payer: Medicare Other

## 2015-01-12 ENCOUNTER — Ambulatory Visit (HOSPITAL_BASED_OUTPATIENT_CLINIC_OR_DEPARTMENT_OTHER): Payer: Medicare Other | Admitting: Nurse Practitioner

## 2015-01-12 VITALS — BP 178/87 | HR 96 | Temp 97.8°F | Wt 162.2 lb

## 2015-01-12 VITALS — BP 149/84 | HR 96 | Temp 97.8°F | Wt 162.0 lb

## 2015-01-12 DIAGNOSIS — C787 Secondary malignant neoplasm of liver and intrahepatic bile duct: Secondary | ICD-10-CM

## 2015-01-12 DIAGNOSIS — C7802 Secondary malignant neoplasm of left lung: Secondary | ICD-10-CM

## 2015-01-12 DIAGNOSIS — C20 Malignant neoplasm of rectum: Secondary | ICD-10-CM

## 2015-01-12 DIAGNOSIS — C7800 Secondary malignant neoplasm of unspecified lung: Secondary | ICD-10-CM

## 2015-01-12 DIAGNOSIS — Z95828 Presence of other vascular implants and grafts: Secondary | ICD-10-CM

## 2015-01-12 LAB — BASIC METABOLIC PANEL (CC13)
Anion Gap: 13 mEq/L — ABNORMAL HIGH (ref 3–11)
BUN: 27.2 mg/dL — ABNORMAL HIGH (ref 7.0–26.0)
CALCIUM: 8.6 mg/dL (ref 8.4–10.4)
CHLORIDE: 109 meq/L (ref 98–109)
CO2: 21 mEq/L — ABNORMAL LOW (ref 22–29)
CREATININE: 1.5 mg/dL — AB (ref 0.7–1.3)
EGFR: 54 mL/min/{1.73_m2} — ABNORMAL LOW (ref >90)
Glucose: 97 mg/dl (ref 70–140)
POTASSIUM: 4.1 meq/L (ref 3.5–5.1)
Sodium: 142 mEq/L (ref 136–145)

## 2015-01-12 MED ORDER — HEPARIN SOD (PORK) LOCK FLUSH 100 UNIT/ML IV SOLN
500.0000 [IU] | Freq: Once | INTRAVENOUS | Status: AC
  Administered 2015-01-12: 500 [IU] via INTRAVENOUS
  Filled 2015-01-12: qty 5

## 2015-01-12 MED ORDER — SODIUM CHLORIDE 0.9 % IJ SOLN
10.0000 mL | INTRAMUSCULAR | Status: DC | PRN
  Administered 2015-01-12: 10 mL via INTRAVENOUS
  Filled 2015-01-12: qty 10

## 2015-01-12 NOTE — Progress Notes (Signed)
[facility] OFFICE PROGRESS NOTE   Diagnosis:  Rectal cancer  INTERVAL HISTORY:   [name] returns as scheduled. He overall feels well. He has a good appetite. No nausea or vomiting. No mouth sores. No diarrhea. He occasionally has pain at the umbilicus/midline scar. He denies any bleeding.  Objective:  Vital signs in last 24 hours:  Blood pressure 178/87, pulse 96, temperature 97.8 F (36.6 C), temperature source Oral, weight 162 lb (73.483 kg). repeat blood pressure 149/84    HEENT: No thrush or ulcers. Resp: Lungs clear bilaterally. Cardio: Regular rate and rhythm. GI: Abdomen soft and nontender. No hepatomegaly. No mass. Vascular: No leg edema. Port-A-Cath without erythema.  Lab Results:  Lab Results  Component Value Date   WBC 3.9* 12/25/2014   HGB 11.4* 12/25/2014   HCT 35.0* 12/25/2014   MCV 99.7* 12/25/2014   PLT 215 12/25/2014   NEUTROABS 2.6 12/25/2014    Imaging:  No results found.  Medications: I have reviewed the patient's current medications.  Assessment/Plan: 1. Metastatic rectal cancer status post biopsy of rectal mass 09/03/2011 with pathology showing invasive adenocarcinoma. Microsatellite stable. Staging CT scans 09/03/2011 showed a 10 mm hypodense rounded lesion in left lateral hepatic lobe, similar 6 mm lesion in the superior right hepatic lobe and a smaller subcapsular lesion in the lateral right hepatic lobe; rectal mass within the lumen of the bowel emanating from the left wall measuring 4.9 x 3.5 cm and a mass within the low left perirectal fat measuring 2.3 x 3.2 cm consistent with local extension of carcinoma. Staging PET scan 09/18/2011 showed a 5.1 cm distal sigmoid/rectal mass with max SUV 19.2 with local extension into the left perirectal fat with max SUV 7.7. At least 2 suspected hepatic metastases with max SUV 6.7 in the lateral segment left hepatic lobe and max SUV 4.1 in the lateral right hepatic dome. Additional tiny  hypodensities in the liver may be cysts; patchy/nodular opacity in the left lower lobe with max SUV 3.5 worrisome for pulmonary metastasis, less likely infectious. He began radiation and concurrent Xeloda chemotherapy on 09/29/2011. He completed radiation on 11/06/2011. MRI of the liver on 12/08/2011 showed 2 hypermetabolic liver lesions, similar in size to 09/03/2011. 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 Equivocal 7 mm lesion in the inferior right hepatic lobe. No evidence of extrahepatic metastasis within the abdomen. An ultrasound-guided biopsy of a small left hepatic lesion on 12/23/2011 was nondiagnostic. He underwent a low anterior resection with loop ileostomy on 02/06/2012 with final pathology showing a 3.5 cm invasive adenocarcinoma with abundant extracellular mucin invading through the muscularis propria into pericolonic fatty tissue; 4 of 10 pericolonic lymph nodes were positive for metastatic carcinoma; resection margins were clear (ypT3,ypN2). K-ras mutation detected. He began FOLFOX chemotherapy on 03/16/2012. He completed 2 cycles of FOLFOX chemotherapy and underwent an ileostomy reversal 05/05/2012. FOLFOX was resumed on 06/14/2012 and completed on 09/14/2012. 2. CEA 13.8 on 09/17/2011; 1.6 on 12/01/2011; 8.4 on 03/09/2012, 2.1 on 05/03/2012; 2.5 on 07/26/2012; 1.8 on 10/12/2012. 3.0 on 11/23/2012. CEA elevated at 25.4 on 02/22/2013, 44.7 on 04/04/2013, and 59.1 on 05/17/2013. CEA improved at 16.8 on 08/10/2013 and 10.8 on 09/23/2013. CEA improved at 8.9 on 10/18/2013. 3. CT chest/abdomen/pelvis 03/10/2013 with 2 new left lower lobe nodules and 2 liver lesions increased in size. Progressive metastatic disease in the lungs and liver on a restaging CT 06/07/2013. He began treatment with FOLFIRI/Avastin on 06/21/2013.  Restaging CT  09/23/2013 after 5 cycles of FOLFIRI/Avastin, with stable lung lesions and a decrease in the size of  dominant liver lesions.   Cycle 6 FOLFIRI/Avastin 09/27/2013.   Cycle 7 FOLFIRI/Avastin 10/18/2013.   Cycle 8 FOLFIRI/Avastin 11/09/2013.   Cycle 9 FOLFIRI/Avastin 12/13/2013.   Cycle 10 FOLFIRI/Avastin 01/03/2014.   Cycle 11 FOLFIRI/Avastin 01/30/2014.   Restaging CT 02/13/2014-no significant change in multiple lung/liver lesions.   Cycle 12 FOLFIRI/Avastin 02/20/2014.   Cycle 13 FOLFIRI/Avastin 03/13/2014.   Cycle 14 FOLFIRI/Avastin 04/03/2014   Cycle 15 FOLFIRI/Avastin 04/24/2014   Cycle 16 FOLFIRI/Avastin 05/16/2014.   Restaging CT evaluation 05/30/2014. No significant change in pulmonary lesions. No significant change in hepatic metastasis.   Cycle 17 5-FU/leucovorin/Avastin (irinotecan eliminated from the regimen) 06/12/2014.   Cycle 18 5-FU/leucovorin/Avastin 07/03/2014   cycle 19 5-FU/leucovorin/Avastin 07/25/2014   CEA increased 07/25/2014 and 08/15/2014.   Restaging CT evaluation 08/31/2014 with mild progression of lung and liver metastases.  FOLFIRI/Avastin resumed 10/02/2014.  Restaging CTs 12/19/2014 with progressive disease in the lungs , liver, and probable new peritoneal metastases 4. K-ras mutation detected. 5. Rectal pain and bleeding secondary to #1. Resolved. 6. Frequent loose stools secondary to #1. Improved. 7. Hypertension. 8. Tobacco use. 9. CT scan 09/03/2011 with chronic right hydronephrosis secondary to an obstructing calculus in the mid right ureter. He is followed by Dr. Janice Norrie. Stable on the CT 06/07/2013 10. Status post cystoscopy, right retrograde pyelogram, ureteroscopy and insertion of right double-J stent 11/28/2011. The double-J stent was replaced 02/06/2012. The stent has been removed. 11. Elevated BUN/creatinine. Likely secondary to dehydration while the ileostomy was in place. He now appears to have chronic renal insufficiency. 12. Status post ultrasound-guided fine-needle aspiration of small left hepatic lesion  12/22/2011. The pathology revealed no malignancy. 13. Malaise and weight loss. Resolved. 14. High output ileostomy. Partially improved with Imodium. Status post ileostomy reversal 05/05/2012. 15. Erectile dysfunction. He is followed by Dr. Janice Norrie. 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 17. Diarrhea after FOLFOX chemotherapy given on 07/26/2012-resolved. The bolus and infusional 5-FU were dose reduced by 20% beginning with FOLFOX on 08/17/2012.  18. History of an Irregular heart rate- Status post evaluation by Dr. Sarajane Jews on 11/02/2012 with findings of sinus rhythm with occasional PACs. 19. Diarrhea following FOLFIRI/Avastin. He reports the diarrhea is relieved with Lomotil. The diarrhea has markedly improved coinciding with discontinuation of Irinotecan. 20. "Sinus congestion". He is status post evaluation by ENT. Symptoms are better. 21. Hypokalemia - he continues a potassium supplement 22. Intermittent bilateral shoulder pain. now with increased pain at the left shoulder requiring frequent tramadol. He has been referred to orthopedics.  23. 24-hour urine 06/07/2014 with 702 mg of protein.     Disposition: [name] appears stable. He is scheduled to see Dr. Reynaldo Minium in 2 weeks to see if he is eligible for a clinical trial. He will return for a follow-up visit here 01/29/2015. He will contact the office in the interim with any problems.  Plan reviewed with Dr. Benay Spice.    Ned Card ANP/GNP-BC   01/12/2015  11:06 AM

## 2015-01-12 NOTE — Telephone Encounter (Signed)
Pt confirmed MD visit per 02/05 POF, gave pt AVS.... KJ

## 2015-01-17 ENCOUNTER — Encounter: Payer: Self-pay | Admitting: Family Medicine

## 2015-01-17 ENCOUNTER — Ambulatory Visit (INDEPENDENT_AMBULATORY_CARE_PROVIDER_SITE_OTHER): Payer: Medicare Other | Admitting: Family Medicine

## 2015-01-17 VITALS — BP 120/72 | Temp 97.9°F | Wt 162.0 lb

## 2015-01-17 DIAGNOSIS — N183 Chronic kidney disease, stage 3 unspecified: Secondary | ICD-10-CM

## 2015-01-17 DIAGNOSIS — F17200 Nicotine dependence, unspecified, uncomplicated: Secondary | ICD-10-CM

## 2015-01-17 DIAGNOSIS — I10 Essential (primary) hypertension: Secondary | ICD-10-CM | POA: Diagnosis not present

## 2015-01-17 DIAGNOSIS — Z720 Tobacco use: Secondary | ICD-10-CM

## 2015-01-17 DIAGNOSIS — N189 Chronic kidney disease, unspecified: Secondary | ICD-10-CM | POA: Diagnosis not present

## 2015-01-17 NOTE — Assessment & Plan Note (Signed)
S: trying to cut back ROS- denies shortness of breath A/P: advised cessation, patient does not want to quit. Some of this seems to be quality of life while dealing with rectal cancer

## 2015-01-17 NOTE — Progress Notes (Signed)
Garret Reddish, MD Phone: [phone]  Subjective:   [name] [name] is a 64 y.o. year old very pleasant male patient who presents with the following:  Hypertension-controlled  On Amlodipine 10mg , terazosin 5mg  CKD Stage III-stable not on ace/arb BP Readings from Last 3 Encounters:  01/17/15 120/72  01/12/15 149/84  01/12/15 178/87  Home BP monitoring-no Compliant with medications-yes without side effects ROS-Denies any CP, HA, SOB, blurry vision.   Past Medical History- Patient Active Problem List   Diagnosis Date Noted  . Rectal cancer, approx. 11 cm from anal verge, s/p LAR and loop ileostomy 02/06/12 09/08/2011    Priority: High  . CIGARETTE SMOKER 01/07/2010    Priority: High  . Hypokalemia 07/14/2014    Priority: Medium  . Chronic renal insufficiency, stage III (moderate) 04/18/2013    Priority: Medium  . Hydronephrosis of right kidney 10/06/2011    Priority: Medium  . HYPERSOMNIA, ASSOCIATED WITH SLEEP APNEA 06/27/2010    Priority: Medium  . Essential hypertension 07/09/2007    Priority: Medium  . Lung metastases 04/18/2013    Priority: Low  . Liver masses, seen on CT scan 09/08/2011    Priority: Low  . Chronic frontal sinusitis 06/27/2010    Priority: Low  . Internal hemorrhoids with other complication 10/04/2535    Priority: Low   Medications- reviewed and updated Current Outpatient Prescriptions  Medication Sig Dispense Refill  . amLODipine (NORVASC) 10 MG tablet TAKE 1 TABLET (10 MG TOTAL) BY MOUTH DAILY. 90 tablet 1  . fluticasone (FLONASE) 50 MCG/ACT nasal spray Place 1 spray into both nostrils daily.     . naproxen sodium (ANAPROX) 220 MG tablet Take 220 mg by mouth 2 (two) times daily with a meal.    . pantoprazole (PROTONIX) 40 MG tablet Take 1 tablet (40 mg total) by mouth 2 (two) times daily. 60 tablet 2  . potassium chloride SA (KLOR-CON M20) 20 MEQ tablet Take 1 tablet (20 mEq total) by mouth daily. 90 tablet 3  . terazosin (HYTRIN) 5 MG  capsule Take 1 capsule (5 mg total) by mouth at bedtime. 30 capsule prn  . acetaminophen (TYLENOL) 325 MG tablet Take 650 mg by mouth every 6 (six) hours as needed.    . diphenoxylate-atropine (LOMOTIL) 2.5-0.025 MG per tablet Take 1 tablet by mouth 4 (four) times daily as needed for diarrhea or loose stools. (Patient not taking: Reported on 01/17/2015) 120 tablet 1  . prochlorperazine (COMPAZINE) 10 MG tablet Take 10 mg by mouth every 6 (six) hours as needed for nausea or vomiting.    . traMADol (ULTRAM) 50 MG tablet Take 1 tablet (50 mg total) by mouth every 12 (twelve) hours as needed. (Patient not taking: Reported on 01/17/2015) 40 tablet 0   No current facility-administered medications for this visit.    Objective: BP 120/72 mmHg  Temp(Src) 97.9 F (36.6 C)  Wt 162 lb (73.483 kg) Gen: NAD, resting comfortably CV: RRR no murmurs rubs or gallops Lungs: CTAB no crackles, wheeze, rhonchi Abdomen: soft/nontender/nondistended/normal bowel sounds.  Ext: no edema Skin: warm, dry, no rash   Assessment/Plan:  Essential hypertension controlled  On Amlodipine 10mg , terazosin 5mg . Reviewed nephrology note-ace/arb was not recommended per their notes with 6 month follow up planne.d    CIGARETTE SMOKER S: trying to cut back ROS- denies shortness of breath A/P: advised cessation, patient does not want to quit. Some of this seems to be quality of life while dealing with rectal cancer   Chronic renal insufficiency, stage  III (moderate) Reviewed [facility] notes-focus was on bp control with no mention of ace/arb. BP much improved today, continue without ace/arb for now. Only start with nephrology input   6-12 month follow up or prn. Return precautions advised.

## 2015-01-17 NOTE — Assessment & Plan Note (Signed)
controlled  On Amlodipine 10mg , terazosin 5mg . Reviewed nephrology note-ace/arb was not recommended per their notes with 6 month follow up planne.d

## 2015-01-17 NOTE — Patient Instructions (Signed)
Blood pressure looks much better  Follow up 6-12 months or as needed.

## 2015-01-17 NOTE — Assessment & Plan Note (Signed)
Reviewed [facility] notes-focus was on bp control with no mention of ace/arb. BP much improved today, continue without ace/arb for now. Only start with nephrology input

## 2015-01-24 DIAGNOSIS — C20 Malignant neoplasm of rectum: Secondary | ICD-10-CM | POA: Diagnosis not present

## 2015-01-29 ENCOUNTER — Telehealth: Payer: Self-pay | Admitting: Nurse Practitioner

## 2015-01-29 ENCOUNTER — Ambulatory Visit (HOSPITAL_BASED_OUTPATIENT_CLINIC_OR_DEPARTMENT_OTHER): Payer: Medicare Other | Admitting: Nurse Practitioner

## 2015-01-29 ENCOUNTER — Encounter: Payer: Self-pay | Admitting: Nurse Practitioner

## 2015-01-29 VITALS — BP 147/76 | HR 57 | Temp 98.4°F | Resp 18 | Ht 69.0 in | Wt 160.9 lb

## 2015-01-29 DIAGNOSIS — C787 Secondary malignant neoplasm of liver and intrahepatic bile duct: Secondary | ICD-10-CM

## 2015-01-29 DIAGNOSIS — E876 Hypokalemia: Secondary | ICD-10-CM

## 2015-01-29 DIAGNOSIS — C20 Malignant neoplasm of rectum: Secondary | ICD-10-CM

## 2015-01-29 DIAGNOSIS — C7800 Secondary malignant neoplasm of unspecified lung: Secondary | ICD-10-CM

## 2015-01-29 DIAGNOSIS — C7802 Secondary malignant neoplasm of left lung: Secondary | ICD-10-CM | POA: Diagnosis not present

## 2015-01-29 MED ORDER — TRAMADOL HCL 50 MG PO TABS: 50.0000 mg | ORAL_TABLET | Freq: Two times a day (BID) | ORAL | Status: AC | PRN

## 2015-01-29 MED ORDER — PANTOPRAZOLE SODIUM 40 MG PO TBEC
40.0000 mg | DELAYED_RELEASE_TABLET | Freq: Two times a day (BID) | ORAL | Status: AC
Start: 2015-01-29 — End: ?

## 2015-01-29 NOTE — Progress Notes (Addendum)
[place] OFFICE PROGRESS NOTE   Diagnosis:  Rectal cancer  INTERVAL HISTORY:   [name] [name] returns as scheduled. He overall feels well. Appetite has been diminished recently. He continues to have intermittent "shooting" pain at the lower mid abdomen. Bowels moving regularly. No diarrhea. No nausea or vomiting. He continues to have shoulder pain. He takes Ultram as needed. He has persistent numbness in the toes.  Objective:  Vital signs in last 24 hours:  Blood pressure 147/76, pulse 57, temperature 98.4 F (36.9 C), temperature source Oral, resp. rate 18, height $RemoveBe'5\' 9"'EGvZsdnVM$  (1.753 m), weight 160 lb 14.4 oz (72.984 kg), SpO2 100 %.    HEENT: No thrush or ulcers. Resp: Lungs clear bilaterally. Cardio: Regular rate and rhythm. GI: Abdomen soft and nontender. No hepatomegaly. No mass. Vascular: No leg edema. Port-A-Cath without erythema.  Lab Results:  Lab Results  Component Value Date   WBC 3.9* 12/25/2014   HGB 11.4* 12/25/2014   HCT 35.0* 12/25/2014   MCV 99.7* 12/25/2014   PLT 215 12/25/2014   NEUTROABS 2.6 12/25/2014    Imaging:  No results found.  Medications: I have reviewed the patient's current medications.  Assessment/Plan: 1. Metastatic rectal cancer status post biopsy of rectal mass 09/03/2011 with pathology showing invasive adenocarcinoma. Microsatellite stable. Staging CT scans 09/03/2011 showed a 10 mm hypodense rounded lesion in left lateral hepatic lobe, similar 6 mm lesion in the superior right hepatic lobe and a smaller subcapsular lesion in the lateral right hepatic lobe; rectal mass within the lumen of the bowel emanating from the left wall measuring 4.9 x 3.5 cm and a mass within the low left perirectal fat measuring 2.3 x 3.2 cm consistent with local extension of carcinoma. Staging PET scan 09/18/2011 showed a 5.1 cm distal sigmoid/rectal mass with max SUV 19.2 with local extension into the left perirectal fat with max SUV 7.7. At least 2  suspected hepatic metastases with max SUV 6.7 in the lateral segment left hepatic lobe and max SUV 4.1 in the lateral right hepatic dome. Additional tiny hypodensities in the liver may be cysts; patchy/nodular opacity in the left lower lobe with max SUV 3.5 worrisome for pulmonary metastasis, less likely infectious. He began radiation and concurrent Xeloda chemotherapy on 09/29/2011. He completed radiation on 11/06/2011. MRI of the liver on 12/08/2011 showed 2 hypermetabolic liver lesions, similar in size to 09/03/2011. 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 Equivocal 7 mm lesion in the inferior right hepatic lobe. No evidence of extrahepatic metastasis within the abdomen. An ultrasound-guided biopsy of a small left hepatic lesion on 12/23/2011 was nondiagnostic. He underwent a low anterior resection with loop ileostomy on 02/06/2012 with final pathology showing a 3.5 cm invasive adenocarcinoma with abundant extracellular mucin invading through the muscularis propria into pericolonic fatty tissue; 4 of 10 pericolonic lymph nodes were positive for metastatic carcinoma; resection margins were clear (ypT3,ypN2). K-ras mutation detected. He began FOLFOX chemotherapy on 03/16/2012. He completed 2 cycles of FOLFOX chemotherapy and underwent an ileostomy reversal 05/05/2012. FOLFOX was resumed on 06/14/2012 and completed on 09/14/2012. 2. CEA 13.8 on 09/17/2011; 1.6 on 12/01/2011; 8.4 on 03/09/2012, 2.1 on 05/03/2012; 2.5 on 07/26/2012; 1.8 on 10/12/2012. 3.0 on 11/23/2012. CEA elevated at 25.4 on 02/22/2013, 44.7 on 04/04/2013, and 59.1 on 05/17/2013. CEA improved at 16.8 on 08/10/2013 and 10.8 on 09/23/2013. CEA improved at 8.9 on 10/18/2013. 3. CT chest/abdomen/pelvis 03/10/2013 with 2 new left lower lobe nodules and 2 liver  lesions increased in size. Progressive metastatic disease in the lungs and liver on a restaging CT 06/07/2013. He began treatment  with FOLFIRI/Avastin on 06/21/2013.  Restaging CT 09/23/2013 after 5 cycles of FOLFIRI/Avastin, with stable lung lesions and a decrease in the size of dominant liver lesions.   Cycle 6 FOLFIRI/Avastin 09/27/2013.   Cycle 7 FOLFIRI/Avastin 10/18/2013.   Cycle 8 FOLFIRI/Avastin 11/09/2013.   Cycle 9 FOLFIRI/Avastin 12/13/2013.   Cycle 10 FOLFIRI/Avastin 01/03/2014.   Cycle 11 FOLFIRI/Avastin 01/30/2014.   Restaging CT 02/13/2014-no significant change in multiple lung/liver lesions.   Cycle 12 FOLFIRI/Avastin 02/20/2014.   Cycle 13 FOLFIRI/Avastin 03/13/2014.   Cycle 14 FOLFIRI/Avastin 04/03/2014   Cycle 15 FOLFIRI/Avastin 04/24/2014   Cycle 16 FOLFIRI/Avastin 05/16/2014.   Restaging CT evaluation 05/30/2014. No significant change in pulmonary lesions. No significant change in hepatic metastasis.   Cycle 17 5-FU/leucovorin/Avastin (irinotecan eliminated from the regimen) 06/12/2014.   Cycle 18 5-FU/leucovorin/Avastin 07/03/2014   cycle 19 5-FU/leucovorin/Avastin 07/25/2014   CEA increased 07/25/2014 and 08/15/2014.   Restaging CT evaluation 08/31/2014 with mild progression of lung and liver metastases.  FOLFIRI/Avastin resumed 10/02/2014.  Restaging CTs 12/19/2014 with progressive disease in the lungs , liver, and probable new peritoneal metastases 4. K-ras mutation detected. 5. Rectal pain and bleeding secondary to #1. Resolved. 6. Frequent loose stools secondary to #1. Improved. 7. Hypertension. 8. Tobacco use. 9. CT scan 09/03/2011 with chronic right hydronephrosis secondary to an obstructing calculus in the mid right ureter. He is followed by Dr. Janice Norrie. Stable on the CT 06/07/2013 10. Status post cystoscopy, right retrograde pyelogram, ureteroscopy and insertion of right double-J stent 11/28/2011. The double-J stent was replaced 02/06/2012. The stent has been removed. 11. Elevated BUN/creatinine. Likely secondary to dehydration while the ileostomy  was in place. He now appears to have chronic renal insufficiency. 12. Status post ultrasound-guided fine-needle aspiration of small left hepatic lesion 12/22/2011. The pathology revealed no malignancy. 13. Malaise and weight loss. Resolved. 14. High output ileostomy. Partially improved with Imodium. Status post ileostomy reversal 05/05/2012. 15. Erectile dysfunction. He is followed by Dr. Janice Norrie. 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 17. Diarrhea after FOLFOX chemotherapy given on 07/26/2012-resolved. The bolus and infusional 5-FU were dose reduced by 20% beginning with FOLFOX on 08/17/2012.  18. History of an Irregular heart rate- Status post evaluation by Dr. Sarajane Jews on 11/02/2012 with findings of sinus rhythm with occasional PACs. 19. Diarrhea following FOLFIRI/Avastin. He reports the diarrhea is relieved with Lomotil. The diarrhea has markedly improved coinciding with discontinuation of Irinotecan. 20. "Sinus congestion". He is status post evaluation by ENT. Symptoms are better. 21. Hypokalemia - he continues a potassium supplement 22. Intermittent bilateral shoulder pain. now with increased pain at the left shoulder requiring frequent tramadol. He has been referred to orthopedics.  23. 24-hour urine 06/07/2014 with 702 mg of protein.      Disposition: Mr. Mussa appears stable. He saw Dr. Reynaldo Minium at [facility]. He was felt to likely be a good candidate for a clinical trial. Mr. Zirbes indicated that he wanted to receive his treatment closer to home. Dr. Reynaldo Minium felt the best local options would be Lonsurf and FOLFOX/bevacizumab.  Outside of a clinical trial Dr. Benay Spice recommends CAPOX/bevacizumab. We reviewed potential toxicities. He understands there is potential for worsening of the neuropathy.  Mr. Lyster would like to discuss the clinical trial option further before making a decision regarding treatment here versus on clinical trial at [facility]. We will request  the clinical trial nurse contact  him.  He will return for a follow-up visit here in 2 weeks. If he makes his decision prior to that and wants treatment in [place] we will make those arrangements.  Patient seen with Dr. Benay Spice. 25 minutes were spent face-to-face at today's visit with the majority of that time involved in counseling/coordination of care.  Ned Card ANP/GNP-BC   01/29/2015  10:37 AM  This was a shared visit with Ned Card.   Mr. Coate was seen by Dr. Reynaldo Minium and appears to be eligible for a clinical trial. He is interested in a phase II study if one is available at [facility]. I will ask Dr. Reynaldo Minium to have the research nurses contact Mr. Toy Cookey. Our plan is to begin CAPOX/Avastin if he does not enroll in a clinical trial.  Julieanne Manson, M.D.

## 2015-01-29 NOTE — Telephone Encounter (Signed)
Pt confirmed MD visit per 02/22 POF, gave pt AVS... KJ

## 2015-01-31 ENCOUNTER — Telehealth: Payer: Self-pay

## 2015-01-31 NOTE — Telephone Encounter (Signed)
Called pt to follow up about clinical trial at [facility]. Pt states he hasnt heard back from them yet. Informed pt this nurse would check back with Dr. Benay Spice in the AM to follow up with Dr.Strickler and research team at [facility]. Pt verbalized understanding

## 2015-02-14 ENCOUNTER — Telehealth: Payer: Self-pay | Admitting: Nurse Practitioner

## 2015-02-14 ENCOUNTER — Telehealth: Payer: Self-pay | Admitting: *Deleted

## 2015-02-14 ENCOUNTER — Other Ambulatory Visit: Payer: Medicare Other

## 2015-02-14 ENCOUNTER — Ambulatory Visit (HOSPITAL_BASED_OUTPATIENT_CLINIC_OR_DEPARTMENT_OTHER): Payer: Medicare Other | Admitting: Nurse Practitioner

## 2015-02-14 VITALS — BP 145/76 | HR 95 | Temp 98.4°F | Resp 18 | Ht 69.0 in | Wt 156.2 lb

## 2015-02-14 DIAGNOSIS — G620 Drug-induced polyneuropathy: Secondary | ICD-10-CM | POA: Diagnosis not present

## 2015-02-14 DIAGNOSIS — E876 Hypokalemia: Secondary | ICD-10-CM

## 2015-02-14 DIAGNOSIS — C20 Malignant neoplasm of rectum: Secondary | ICD-10-CM

## 2015-02-14 DIAGNOSIS — C787 Secondary malignant neoplasm of liver and intrahepatic bile duct: Secondary | ICD-10-CM

## 2015-02-14 DIAGNOSIS — C7800 Secondary malignant neoplasm of unspecified lung: Secondary | ICD-10-CM | POA: Diagnosis not present

## 2015-02-14 NOTE — Telephone Encounter (Signed)
Pt confirmed labs/ov per 03/09 POF, gave pt AVS.... KJ, sent msg to add chemo

## 2015-02-14 NOTE — Telephone Encounter (Signed)
Per staff message and POF I have scheduled appts. Advised scheduler of appts. JMW  

## 2015-02-14 NOTE — Progress Notes (Signed)
[place] OFFICE PROGRESS NOTE   Diagnosis:  Rectal cancer  INTERVAL HISTORY:   [name] [name] returns as scheduled. He has been constipated. He took Ex-Lax with good results. He attributes his decrease in appetite to the constipation. No nausea or vomiting. No abdominal pain. He continues to have bilateral shoulder pain. He has persistent mild numbness in the toes.  Objective:  Vital signs in last 24 hours:  Blood pressure 145/76, pulse 95, temperature 98.4 F (36.9 C), temperature source Oral, resp. rate 18, height $RemoveBe'5\' 9"'[id number]$  (1.753 m), weight 156 lb 3.2 oz (70.852 kg), SpO2 100 %.    HEENT: No thrush or ulcers. Resp: Lungs clear bilaterally. Cardio: Regular rate and rhythm. GI: Abdomen soft and nontender. Fullness at the medial right upper abdomen. No hepatomegaly. Vascular: No leg edema. Neuro: Mild decrease in vibratory sense over the fingertips per tuning fork exam.  Skin: No rash. Port-A-Cath without erythema.    Lab Results:  Lab Results  Component Value Date   WBC 3.9* 12/25/2014   HGB 11.4* 12/25/2014   HCT 35.0* 12/25/2014   MCV 99.7* 12/25/2014   PLT 215 12/25/2014   NEUTROABS 2.6 12/25/2014    Imaging:  No results found.  Medications: I have reviewed the patient's current medications.  Assessment/Plan: 1. Metastatic rectal cancer status post biopsy of rectal mass 09/03/2011 with pathology showing invasive adenocarcinoma. Microsatellite stable. Staging CT scans 09/03/2011 showed a 10 mm hypodense rounded lesion in left lateral hepatic lobe, similar 6 mm lesion in the superior right hepatic lobe and a smaller subcapsular lesion in the lateral right hepatic lobe; rectal mass within the lumen of the bowel emanating from the left wall measuring 4.9 x 3.5 cm and a mass within the low left perirectal fat measuring 2.3 x 3.2 cm consistent with local extension of carcinoma. Staging PET scan 09/18/2011 showed a 5.1 cm distal sigmoid/rectal mass with max SUV  19.2 with local extension into the left perirectal fat with max SUV 7.7. At least 2 suspected hepatic metastases with max SUV 6.7 in the lateral segment left hepatic lobe and max SUV 4.1 in the lateral right hepatic dome. Additional tiny hypodensities in the liver may be cysts; patchy/nodular opacity in the left lower lobe with max SUV 3.5 worrisome for pulmonary metastasis, less likely infectious. He began radiation and concurrent Xeloda chemotherapy on 09/29/2011. He completed radiation on 11/06/2011. MRI of the liver on 12/08/2011 showed 2 hypermetabolic liver lesions, similar in size to 09/03/2011. 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 Equivocal 7 mm lesion in the inferior right hepatic lobe. No evidence of extrahepatic metastasis within the abdomen. An ultrasound-guided biopsy of a small left hepatic lesion on 12/23/2011 was nondiagnostic. He underwent a low anterior resection with loop ileostomy on 02/06/2012 with final pathology showing a 3.5 cm invasive adenocarcinoma with abundant extracellular mucin invading through the muscularis propria into pericolonic fatty tissue; 4 of 10 pericolonic lymph nodes were positive for metastatic carcinoma; resection margins were clear (ypT3,ypN2). K-ras mutation detected. He began FOLFOX chemotherapy on 03/16/2012. He completed 2 cycles of FOLFOX chemotherapy and underwent an ileostomy reversal 05/05/2012. FOLFOX was resumed on 06/14/2012 and completed on 09/14/2012. 2. CEA 13.8 on 09/17/2011; 1.6 on 12/01/2011; 8.4 on 03/09/2012, 2.1 on 05/03/2012; 2.5 on 07/26/2012; 1.8 on 10/12/2012. 3.0 on 11/23/2012. CEA elevated at 25.4 on 02/22/2013, 44.7 on 04/04/2013, and 59.1 on 05/17/2013. CEA improved at 16.8 on 08/10/2013 and 10.8 on 09/23/2013. CEA improved at  8.9 on 10/18/2013. 3. CT chest/abdomen/pelvis 03/10/2013 with 2 new left lower lobe nodules and 2 liver lesions increased in size. Progressive  metastatic disease in the lungs and liver on a restaging CT 06/07/2013. He began treatment with FOLFIRI/Avastin on 06/21/2013.  Restaging CT 09/23/2013 after 5 cycles of FOLFIRI/Avastin, with stable lung lesions and a decrease in the size of dominant liver lesions.   Cycle 6 FOLFIRI/Avastin 09/27/2013.   Cycle 7 FOLFIRI/Avastin 10/18/2013.   Cycle 8 FOLFIRI/Avastin 11/09/2013.   Cycle 9 FOLFIRI/Avastin 12/13/2013.   Cycle 10 FOLFIRI/Avastin 01/03/2014.   Cycle 11 FOLFIRI/Avastin 01/30/2014.   Restaging CT 02/13/2014-no significant change in multiple lung/liver lesions.   Cycle 12 FOLFIRI/Avastin 02/20/2014.   Cycle 13 FOLFIRI/Avastin 03/13/2014.   Cycle 14 FOLFIRI/Avastin 04/03/2014   Cycle 15 FOLFIRI/Avastin 04/24/2014   Cycle 16 FOLFIRI/Avastin 05/16/2014.   Restaging CT evaluation 05/30/2014. No significant change in pulmonary lesions. No significant change in hepatic metastasis.   Cycle 17 5-FU/leucovorin/Avastin (irinotecan eliminated from the regimen) 06/12/2014.   Cycle 18 5-FU/leucovorin/Avastin 07/03/2014   cycle 19 5-FU/leucovorin/Avastin 07/25/2014   CEA increased 07/25/2014 and 08/15/2014.   Restaging CT evaluation 08/31/2014 with mild progression of lung and liver metastases.  FOLFIRI/Avastin resumed 10/02/2014.  Restaging CTs 12/19/2014 with progressive disease in the lungs , liver, and probable new peritoneal metastases 4. K-ras mutation detected. 5. Rectal pain and bleeding secondary to #1. Resolved. 6. Frequent loose stools secondary to #1. Improved. 7. Hypertension. 8. Tobacco use. 9. CT scan 09/03/2011 with chronic right hydronephrosis secondary to an obstructing calculus in the mid right ureter. He is followed by Dr. Janice Norrie. Stable on the CT 06/07/2013 10. Status post cystoscopy, right retrograde pyelogram, ureteroscopy and insertion of right double-J stent 11/28/2011. The double-J stent was replaced 02/06/2012. The stent has been  removed. 11. Elevated BUN/creatinine. Likely secondary to dehydration while the ileostomy was in place. He now appears to have chronic renal insufficiency. 12. Status post ultrasound-guided fine-needle aspiration of small left hepatic lesion 12/22/2011. The pathology revealed no malignancy. 13. Malaise and weight loss. Resolved. 14. High output ileostomy. Partially improved with Imodium. Status post ileostomy reversal 05/05/2012. 15. Erectile dysfunction. He is followed by Dr. Janice Norrie. 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 17. Diarrhea after FOLFOX chemotherapy given on 07/26/2012-resolved. The bolus and infusional 5-FU were dose reduced by 20% beginning with FOLFOX on 08/17/2012.  18. History of an Irregular heart rate- Status post evaluation by Dr. Sarajane Jews on 11/02/2012 with findings of sinus rhythm with occasional PACs. 19. Diarrhea following FOLFIRI/Avastin. He reports the diarrhea is relieved with Lomotil. The diarrhea has markedly improved coinciding with discontinuation of Irinotecan. 20. "Sinus congestion". He is status post evaluation by ENT. Symptoms are better. 21. Hypokalemia - he continues a potassium supplement 22. Intermittent bilateral shoulder pain. now with increased pain at the left shoulder requiring frequent tramadol. He has been referred to orthopedics.  23. 24-hour urine 06/07/2014 with 702 mg of protein.      Disposition: [name] [name] appears stable. He has decided against participating in the study at [facility]. He would like to proceed with CAPOX/Avastin as discussed at our last visit. We again reviewed potential toxicities including myelosuppression, nausea, allergic reaction. We discussed the neurotoxicity associated with oxaliplatin. We discussed the possibility of mouth sores, diarrhea, skin rash, skin hyperpigmentation, conjunctivitis with Xeloda. We reviewed potential toxicities associated with Avastin including bleeding, delayed wound healing,  bowel perforation, hypertension, proteinuria, blood clots. He is agreeable to proceed. We are obtaining a baseline 24  hour urine. He will return for cycle 1 on 02/21/2015. We will see him in follow-up prior to cycle 2 on 03/14/2015. He will contact the office in the interim with any problems.  Plan reviewed with Dr. Benay Spice. 25 minutes were spent face-to-face at today's visit with the majority of that time involved in counseling/coordination of care.    Ned Card ANP/GNP-BC   02/14/2015  9:29 AM

## 2015-02-16 DIAGNOSIS — C20 Malignant neoplasm of rectum: Secondary | ICD-10-CM | POA: Diagnosis not present

## 2015-02-16 DIAGNOSIS — C7800 Secondary malignant neoplasm of unspecified lung: Secondary | ICD-10-CM | POA: Diagnosis not present

## 2015-02-17 LAB — PROTEIN, URINE, 24 HOUR
Collection Interval-UPROT: 24 hours
PROTEIN 24H UR: 3339 mg/d — AB (ref <150)
PROTEIN, URINE: 477 mg/dL — AB (ref 5–25)
Urine Total Volume-UPROT: 700 mL

## 2015-02-21 ENCOUNTER — Other Ambulatory Visit: Payer: Self-pay | Admitting: Oncology

## 2015-02-21 ENCOUNTER — Encounter: Payer: Self-pay | Admitting: Oncology

## 2015-02-21 ENCOUNTER — Ambulatory Visit: Payer: Medicare Other

## 2015-02-21 ENCOUNTER — Other Ambulatory Visit: Payer: Self-pay | Admitting: *Deleted

## 2015-02-21 ENCOUNTER — Telehealth: Payer: Self-pay | Admitting: Oncology

## 2015-02-21 ENCOUNTER — Other Ambulatory Visit: Payer: Medicare Other

## 2015-02-21 MED ORDER — CAPECITABINE 500 MG PO TABS: ORAL_TABLET | ORAL | Status: DC

## 2015-02-21 NOTE — Progress Notes (Signed)
Faxed scrip to [phone] for Xeloda

## 2015-02-21 NOTE — Telephone Encounter (Signed)
Received message from Maudie Mercury, scheduler re: pt wanted to cancel appts for today due to having diarrhea.  Noted that another triage nurse Rosalind had spoken with pt.  Left message on voice mail requesting a call back from pt to see if pt would like to come in to see Jenny Reichmann, NP symptom management and possible IVF due to diarrhea.

## 2015-02-21 NOTE — Telephone Encounter (Signed)
Pt called states he wants to cancel his visits for today due to a bad cough and he is having loose bowels, sent pt to triage.

## 2015-02-21 NOTE — Telephone Encounter (Signed)
Spoke with [name] [name] who says he's had lots of stools.  Has taken imodium which is slowing them down some.  Unable to tell me how many stools he's had as too numerous.  Asked if he's taken any more Ex-lax and he says he goes from one extreme to the other.  Reports chest congestion with hacky cough, occasional clear phlegm.  I don't feel like I have a fever but do not have thermometer.  Offered to bring in to [facility] but says "I'd rather see Dr. Yong Channel".  Will notify Dr. Benay Spice.  Informed him the treatment will be rescheduled for next week and a scheduler will call.  Will notify Dr. Benay Spice

## 2015-02-21 NOTE — Telephone Encounter (Signed)
Verbal order received and read back from Dr. Benay Spice to reschedule chemotherapy to later this week or next week.  Also to reschedule F/U and next cycle three weeks after whatever date today is rescheduled for.  Called patient who "would like treatment next Monday or Wednesday and keep appointment at 10:00 am if possible".  P.O.F. Generated.

## 2015-02-22 ENCOUNTER — Encounter: Payer: Self-pay | Admitting: Family Medicine

## 2015-02-22 ENCOUNTER — Telehealth: Payer: Self-pay | Admitting: Oncology

## 2015-02-22 ENCOUNTER — Encounter: Payer: Self-pay | Admitting: Oncology

## 2015-02-22 ENCOUNTER — Telehealth: Payer: Self-pay

## 2015-02-22 ENCOUNTER — Ambulatory Visit (INDEPENDENT_AMBULATORY_CARE_PROVIDER_SITE_OTHER): Payer: Medicare Other | Admitting: Family Medicine

## 2015-02-22 ENCOUNTER — Telehealth: Payer: Self-pay | Admitting: *Deleted

## 2015-02-22 VITALS — BP 100/70 | HR 88 | Temp 98.0°F | Wt 155.0 lb

## 2015-02-22 DIAGNOSIS — C20 Malignant neoplasm of rectum: Secondary | ICD-10-CM

## 2015-02-22 DIAGNOSIS — R0602 Shortness of breath: Secondary | ICD-10-CM

## 2015-02-22 DIAGNOSIS — J209 Acute bronchitis, unspecified: Secondary | ICD-10-CM

## 2015-02-22 DIAGNOSIS — Z720 Tobacco use: Secondary | ICD-10-CM

## 2015-02-22 DIAGNOSIS — Z23 Encounter for immunization: Secondary | ICD-10-CM

## 2015-02-22 DIAGNOSIS — F17200 Nicotine dependence, unspecified, uncomplicated: Secondary | ICD-10-CM

## 2015-02-22 LAB — CBC WITH DIFFERENTIAL/PLATELET
BASOS PCT: 0 % (ref 0.0–3.0)
Basophils Absolute: 0 10*3/uL (ref 0.0–0.1)
Eosinophils Absolute: 0 10*3/uL (ref 0.0–0.7)
Eosinophils Relative: 0.1 % (ref 0.0–5.0)
HCT: 25.5 % — ABNORMAL LOW (ref 39.0–52.0)
Hemoglobin: 8.4 g/dL — ABNORMAL LOW (ref 13.0–17.0)
LYMPHS ABS: 0.4 10*3/uL — AB (ref 0.7–4.0)
Lymphocytes Relative: 4.7 % — ABNORMAL LOW (ref 12.0–46.0)
MCHC: 32.9 g/dL (ref 30.0–36.0)
MCV: 94.2 fl (ref 78.0–100.0)
MONO ABS: 0.4 10*3/uL (ref 0.1–1.0)
Monocytes Relative: 4.7 % (ref 3.0–12.0)
Neutro Abs: 7.9 10*3/uL — ABNORMAL HIGH (ref 1.4–7.7)
Neutrophils Relative %: 90.5 % — ABNORMAL HIGH (ref 43.0–77.0)
PLATELETS: 250 10*3/uL (ref 150.0–400.0)
RDW: 17.8 % — ABNORMAL HIGH (ref 11.5–15.5)
WBC: 8.7 10*3/uL (ref 4.0–10.5)

## 2015-02-22 LAB — COMPREHENSIVE METABOLIC PANEL
ALBUMIN: 2.4 g/dL — AB (ref 3.5–5.2)
ALT: 65 U/L — ABNORMAL HIGH (ref 0–53)
AST: 125 U/L — ABNORMAL HIGH (ref 0–37)
Alkaline Phosphatase: 430 U/L — ABNORMAL HIGH (ref 39–117)
BUN: 43 mg/dL — AB (ref 6–23)
CO2: 26 mEq/L (ref 19–32)
Calcium: 8 mg/dL — ABNORMAL LOW (ref 8.4–10.5)
Chloride: 100 mEq/L (ref 96–112)
Creatinine, Ser: 1.83 mg/dL — ABNORMAL HIGH (ref 0.40–1.50)
GFR: 47.86 mL/min — ABNORMAL LOW (ref >60.00)
Glucose, Bld: 112 mg/dL — ABNORMAL HIGH (ref 70–99)
Potassium: 2.9 mEq/L — ABNORMAL LOW (ref 3.5–5.1)
SODIUM: 135 meq/L (ref 135–145)
TOTAL PROTEIN: 6.3 g/dL (ref 6.0–8.3)
Total Bilirubin: 3.9 mg/dL — ABNORMAL HIGH (ref 0.2–1.2)

## 2015-02-22 MED ORDER — LEVOFLOXACIN 500 MG PO TABS: 500.0000 mg | ORAL_TABLET | Freq: Every day | ORAL | Status: DC

## 2015-02-22 MED ORDER — CAPECITABINE 500 MG PO TABS: ORAL_TABLET | ORAL | Status: DC

## 2015-02-22 MED ORDER — PREDNISONE 20 MG PO TABS: 20.0000 mg | ORAL_TABLET | Freq: Every day | ORAL | Status: DC

## 2015-02-22 MED ORDER — ALBUTEROL SULFATE HFA 108 (90 BASE) MCG/ACT IN AERS: 2.0000 | INHALATION_SPRAY | RESPIRATORY_TRACT | Status: DC | PRN

## 2015-02-22 NOTE — Progress Notes (Signed)
Correction faxed to [phone]

## 2015-02-22 NOTE — Telephone Encounter (Addendum)
PRESCRIPTION AND PHARMACY CORRECTED.

## 2015-02-22 NOTE — Telephone Encounter (Signed)
[facility] WAS UNABLE TO FILL PT.'S XELODA PRESCRIPTION. [facility] RECEIVED PRESCRIPTION BUT NEED TO KNOW HOW MANY TABLETS TO DISPENSE AND IF PT. TAKES FOR 14 DAYS ON AND 7 DAYS OFF. VERBAL ORDER AND READ BACK TO DR.SHERRILL - DISPENSE #[id number] AND PT. TAKES FOR 14 DAYS ON AND 7 DAYS OFF. NOTIFIED JAZMINE.

## 2015-02-22 NOTE — Progress Notes (Signed)
I faxed request for possible ass for Xeloda to [facility] [phone]

## 2015-02-22 NOTE — Progress Notes (Signed)
Garret Reddish, MD Phone: [phone]  Subjective:   [name] [name] is a 64 y.o. year old very pleasant male patient who presents with the following:  Cough/congestion/wheeze/shortness of breath High risk patient with history of rectal cancer and lung mets.  -7 days of cough, nasal congestion. Swallows much of what he coughs up so not sure of apperance. Does have some yellow/green sinus drainage. Minimal sinus pressure. Notes some wheeze. Has shortness of breath walking to his car which is abnormal for him.   ROS-nausea and sometimes feels like things get stuck in his epigastric area, no fever. Admits to some fatigue.   Past Medical History- Patient Active Problem List   Diagnosis Date Noted  . Rectal cancer, approx. 11 cm from anal verge, s/p LAR and loop ileostomy 02/06/12 09/08/2011    Priority: High  . CIGARETTE SMOKER 01/07/2010    Priority: High  . Hypokalemia 07/14/2014    Priority: Medium  . Chronic renal insufficiency, stage III (moderate) 04/18/2013    Priority: Medium  . Hydronephrosis of right kidney 10/06/2011    Priority: Medium  . HYPERSOMNIA, ASSOCIATED WITH SLEEP APNEA 06/27/2010    Priority: Medium  . Essential hypertension 07/09/2007    Priority: Medium  . Lung metastases 04/18/2013    Priority: Low  . Liver masses, seen on CT scan 09/08/2011    Priority: Low  . Chronic frontal sinusitis 06/27/2010    Priority: Low  . Internal hemorrhoids with other complication 45/80/9983    Priority: Low   Medications- reviewed and updated Current Outpatient Prescriptions  Medication Sig Dispense Refill  . amLODipine (NORVASC) 10 MG tablet TAKE 1 TABLET (10 MG TOTAL) BY MOUTH DAILY. 90 tablet 1  . capecitabine (XELODA) 500 MG tablet Take 3 tab (=1500mg ) in AM; Take 3 tab (=1500mg ) in PM.  Total daily dose of 3000mg  for 14 days.  Start 02/21/15 84 tablet 0  . fluticasone (FLONASE) 50 MCG/ACT nasal spray Place 1 spray into both nostrils daily.     . naproxen sodium  (ANAPROX) 220 MG tablet Take 220 mg by mouth 2 (two) times daily with a meal.    . pantoprazole (PROTONIX) 40 MG tablet Take 1 tablet (40 mg total) by mouth 2 (two) times daily. 60 tablet 2  . potassium chloride SA (KLOR-CON M20) 20 MEQ tablet Take 1 tablet (20 mEq total) by mouth daily. 90 tablet 3  . prochlorperazine (COMPAZINE) 10 MG tablet Take 10 mg by mouth every 6 (six) hours as needed for nausea or vomiting.    . terazosin (HYTRIN) 5 MG capsule Take 1 capsule (5 mg total) by mouth at bedtime. 30 capsule prn  . acetaminophen (TYLENOL) 325 MG tablet Take 650 mg by mouth every 6 (six) hours as needed.    . diphenoxylate-atropine (LOMOTIL) 2.5-0.025 MG per tablet Take 1 tablet by mouth 4 (four) times daily as needed for diarrhea or loose stools. (Patient not taking: Reported on 02/14/2015) 120 tablet 1  . traMADol (ULTRAM) 50 MG tablet Take 1 tablet (50 mg total) by mouth every 12 (twelve) hours as needed. (Patient not taking: Reported on 02/22/2015) 40 tablet 0   No current facility-administered medications for this visit.    Objective: BP 100/70 mmHg  Pulse 88  Temp(Src) 98 F (36.7 C)  Wt 155 lb (70.308 kg)  SpO2 93% Gen: NAD, resting comfortably in chair but appears fatigued CV: RRR no murmurs rubs or gallops, occasional ectopic beat Lungs: no crackles, wheeze. Rhonchi in mid left lung field  noted. Reasonable air movement.  Abdomen: soft/nontender/nondistended/normal bowel sounds. No rebound or guarding.  Ext: no edema Skin: warm, dry, no rash   Assessment/Plan:  Bronchitis With history of current smoking but no clear COPD diagnosis. Patient is high risk for COPD but also for infection given cancer patient and immunosuppressed on chemo. Does have lung mets and this could represent progression but acute nature makes me lean toward infection. Therefore, we opted to be aggressive in care today and use antibiotics for potential COPD exacerbation (would also cover bacterial bronchitis as  well as sinusitis which he has features of though not wheezing in person today). We discussed I was also being more aggressive than I typically would be in antibiotic choice given comorbid conditions and high risk patient.   Check labs today including CBC and CMET STart levaquin for 7 days (i may call you to reduce dose) Prednisone low dose for 5 days Albuterol as needed for shortness of breath or wheeze  Strict Return precautions advised with follow up planned Monday. We discussed if no improvement we would discuss with oncology about potential repeat CT Chest given lung mets.

## 2015-02-22 NOTE — Addendum Note (Signed)
Addended by: Wyonia Hough on: 02/22/2015 11:50 AM   Modules accepted: Orders

## 2015-02-22 NOTE — Addendum Note (Signed)
Addended by: Wyonia Hough on: 02/22/2015 11:54 AM   Modules accepted: Orders

## 2015-02-22 NOTE — Telephone Encounter (Signed)
Per staff message and POF I have scheduled appts. Advised scheduler of appts. JMW  

## 2015-02-22 NOTE — Patient Instructions (Addendum)
Received last Pneumonia shot today ([id number])  Check labs today.   STart levaquin for 7 days (i may call you to reduce dose) Prednisone low dose for 5 days Albuterol as needed for shortness of breath or wheeze  See me on Monday (may use SDA slot) or tomorrow if symptoms worsen. Seek care if your symptoms become more severe in an urgent care or emergency room (I do not strongly suspect this will happen)

## 2015-02-22 NOTE — Telephone Encounter (Signed)
Received call from [employer] informing that patient has co-pay of $323.07 (20%) out of pocket, for Xeloda.  [employer] will contact the patient will more option, and will also try to get patient funding.

## 2015-02-22 NOTE — Telephone Encounter (Signed)
Labs/ov r/s due to pt is sick, mailed schedule to pt and confirmed with pt... KJ sent msg to add chemo

## 2015-02-23 ENCOUNTER — Telehealth: Payer: Self-pay | Admitting: Family Medicine

## 2015-02-23 ENCOUNTER — Ambulatory Visit (INDEPENDENT_AMBULATORY_CARE_PROVIDER_SITE_OTHER): Payer: Medicare Other | Admitting: Family Medicine

## 2015-02-23 ENCOUNTER — Encounter: Payer: Self-pay | Admitting: Family Medicine

## 2015-02-23 ENCOUNTER — Telehealth (INDEPENDENT_AMBULATORY_CARE_PROVIDER_SITE_OTHER): Payer: Medicare Other

## 2015-02-23 ENCOUNTER — Other Ambulatory Visit: Payer: Self-pay

## 2015-02-23 VITALS — BP 120/60 | HR 92 | Wt 155.0 lb

## 2015-02-23 DIAGNOSIS — D649 Anemia, unspecified: Secondary | ICD-10-CM | POA: Diagnosis not present

## 2015-02-23 DIAGNOSIS — C20 Malignant neoplasm of rectum: Secondary | ICD-10-CM | POA: Diagnosis not present

## 2015-02-23 DIAGNOSIS — Z720 Tobacco use: Secondary | ICD-10-CM | POA: Diagnosis not present

## 2015-02-23 DIAGNOSIS — R7989 Other specified abnormal findings of blood chemistry: Secondary | ICD-10-CM

## 2015-02-23 DIAGNOSIS — R0602 Shortness of breath: Secondary | ICD-10-CM

## 2015-02-23 DIAGNOSIS — R945 Abnormal results of liver function studies: Principal | ICD-10-CM

## 2015-02-23 DIAGNOSIS — F17200 Nicotine dependence, unspecified, uncomplicated: Secondary | ICD-10-CM

## 2015-02-23 LAB — CBC WITH DIFFERENTIAL/PLATELET
BASOS ABS: 0 10*3/uL (ref 0.0–0.1)
Basophils Relative: 0 % (ref 0.0–3.0)
EOS ABS: 0 10*3/uL (ref 0.0–0.7)
Eosinophils Relative: 0.3 % (ref 0.0–5.0)
HCT: 25.4 % — ABNORMAL LOW (ref 39.0–52.0)
Hemoglobin: 8.6 g/dL — ABNORMAL LOW (ref 13.0–17.0)
Lymphocytes Relative: 6.6 % — ABNORMAL LOW (ref 12.0–46.0)
Lymphs Abs: 0.6 10*3/uL — ABNORMAL LOW (ref 0.7–4.0)
MCHC: 33.7 g/dL (ref 30.0–36.0)
MCV: 93.2 fl (ref 78.0–100.0)
Monocytes Absolute: 0.3 10*3/uL (ref 0.1–1.0)
Monocytes Relative: 3.2 % (ref 3.0–12.0)
NEUTROS ABS: 7.8 10*3/uL — AB (ref 1.4–7.7)
NEUTROS PCT: 89.9 % — AB (ref 43.0–77.0)
Platelets: 268 10*3/uL (ref 150.0–400.0)
RBC: 2.73 Mil/uL — AB (ref 4.22–5.81)
RDW: 17.9 % — AB (ref 11.5–15.5)
WBC: 8.6 10*3/uL (ref 4.0–10.5)

## 2015-02-23 LAB — COMPREHENSIVE METABOLIC PANEL
ALBUMIN: 2.4 g/dL — AB (ref 3.5–5.2)
ALT: 74 U/L — ABNORMAL HIGH (ref 0–53)
AST: 131 U/L — ABNORMAL HIGH (ref 0–37)
Alkaline Phosphatase: 514 U/L — ABNORMAL HIGH (ref 39–117)
BUN: 41 mg/dL — AB (ref 6–23)
CALCIUM: 8.1 mg/dL — AB (ref 8.4–10.5)
CHLORIDE: 100 meq/L (ref 96–112)
CO2: 27 mEq/L (ref 19–32)
Creatinine, Ser: 1.76 mg/dL — ABNORMAL HIGH (ref 0.40–1.50)
GFR: 50.06 mL/min — AB (ref >60.00)
GLUCOSE: 127 mg/dL — AB (ref 70–99)
Potassium: 2.8 mEq/L — CL (ref 3.5–5.1)
Sodium: 135 mEq/L (ref 135–145)
Total Bilirubin: 3.7 mg/dL — ABNORMAL HIGH (ref 0.2–1.2)
Total Protein: 6.3 g/dL (ref 6.0–8.3)

## 2015-02-23 LAB — POCT HEMOGLOBIN: Hemoglobin: 6 g/dL — AB (ref 14.1–18.1)

## 2015-02-23 MED ORDER — CAPECITABINE 500 MG PO TABS: ORAL_TABLET | ORAL | Status: AC

## 2015-02-23 MED ORDER — ALBUTEROL SULFATE HFA 108 (90 BASE) MCG/ACT IN AERS
2.0000 | INHALATION_SPRAY | RESPIRATORY_TRACT | Status: DC | PRN
Start: 2015-02-23 — End: 2015-03-01

## 2015-02-23 MED ORDER — PREDNISONE 20 MG PO TABS: 20.0000 mg | ORAL_TABLET | Freq: Every day | ORAL | Status: AC

## 2015-02-23 NOTE — Patient Instructions (Addendum)
Pt scheduled for Ultrasound with  Imaging at [address] on 02/28/15 at 7:40am. Nothing to eat or drink after midnight the night before.  See Dr. Benay Spice next Wednesday  If you started coughing again or wheezing I would pick up the medicine, otherwise ok to hold off  If you get very short of breath or fatigued again, I would call our after hours line or seek care in the hospital.

## 2015-02-23 NOTE — Telephone Encounter (Signed)
Medications resent to [facility]

## 2015-02-23 NOTE — Progress Notes (Signed)
[name] Reddish, MD Phone: [phone]  Subjective:   [name] [name] is a 64 y.o. year old very pleasant male patient who presents with the following:  Patient presents for follow up of shortness of breath, wheeze, cough. With his smoking history, I thought he had a potential COPD exacerbation or bronchitis so the plan was to treat with prednisone, levaquin, and albuterol yesterday. He was found to be anemic on CBC. His LFTs were elevated and bili.   Today, patient reports his shortness of breath is better and he is coughing less, he has not heard anymore wheeze. This is despite the fact that he did not pick up any of the medicine as [facility] usually [facility] but most recent RX sent to [facility] outpatient pharmacy.   ROS- denies chest pain or fevers. Has some fatigue but improving.   Past Medical History- Patient Active Problem List   Diagnosis Date Noted  . Rectal cancer, approx. 11 cm from anal verge, s/p LAR and loop ileostomy 02/06/12 09/08/2011    Priority: High  . CIGARETTE SMOKER 01/07/2010    Priority: High  . Hypokalemia 07/14/2014    Priority: Medium  . Chronic renal insufficiency, stage III (moderate) 04/18/2013    Priority: Medium  . Hydronephrosis of right kidney 10/06/2011    Priority: Medium  . HYPERSOMNIA, ASSOCIATED WITH SLEEP APNEA 06/27/2010    Priority: Medium  . Essential hypertension 07/09/2007    Priority: Medium  . Lung metastases 04/18/2013    Priority: Low  . Liver masses, seen on CT scan 09/08/2011    Priority: Low  . Chronic frontal sinusitis 06/27/2010    Priority: Low  . Internal hemorrhoids with other complication 32/67/1245    Priority: Low   Medications- reviewed and updated Current Outpatient Prescriptions  Medication Sig Dispense Refill  . amLODipine (NORVASC) 10 MG tablet TAKE 1 TABLET (10 MG TOTAL) BY MOUTH DAILY. 90 tablet 1  . diphenoxylate-atropine (LOMOTIL) 2.5-0.025 MG per tablet Take 1 tablet by mouth 4 (four) times daily as  needed for diarrhea or loose stools. 120 tablet 1  . fluticasone (FLONASE) 50 MCG/ACT nasal spray Place 1 spray into both nostrils daily.     Marland Kitchen levofloxacin (LEVAQUIN) 500 MG tablet Take 1 tablet (500 mg total) by mouth daily. I may call you to take 1/2 tab depending on kidney function today 7 tablet 0  . naproxen sodium (ANAPROX) 220 MG tablet Take 220 mg by mouth 2 (two) times daily with a meal.    . pantoprazole (PROTONIX) 40 MG tablet Take 1 tablet (40 mg total) by mouth 2 (two) times daily. 60 tablet 2  . potassium chloride SA (KLOR-CON M20) 20 MEQ tablet Take 1 tablet (20 mEq total) by mouth daily. 90 tablet 3  . prochlorperazine (COMPAZINE) 10 MG tablet Take 10 mg by mouth every 6 (six) hours as needed for nausea or vomiting.    . terazosin (HYTRIN) 5 MG capsule Take 1 capsule (5 mg total) by mouth at bedtime. 30 capsule prn  . acetaminophen (TYLENOL) 325 MG tablet Take 650 mg by mouth every 6 (six) hours as needed.    Marland Kitchen albuterol (PROVENTIL HFA;VENTOLIN HFA) 108 (90 BASE) MCG/ACT inhaler Inhale 2 puffs into the lungs every 4 (four) hours as needed for wheezing or shortness of breath. (Patient not taking: Reported on 02/23/2015) 1 Inhaler 0  . capecitabine (XELODA) 500 MG tablet Take 3 tab (=1500mg ) in AM; Take 3 tab (=1500mg ) in PM.  Total daily dose of  3000mg  for 14 days on and 7 days off Start 02/21/15 (Patient not taking: Reported on 02/23/2015) 84 tablet 0  . predniSONE (DELTASONE) 20 MG tablet Take 1 tablet (20 mg total) by mouth daily with breakfast. (Patient not taking: Reported on 02/23/2015) 5 tablet 0  . traMADol (ULTRAM) 50 MG tablet Take 1 tablet (50 mg total) by mouth every 12 (twelve) hours as needed. (Patient not taking: Reported on 02/22/2015) 40 tablet 0   No current facility-administered medications for this visit.    Objective: BP 120/60 mmHg  Pulse 92  Wt 155 lb (70.308 kg)  SpO2 92% Gen: NAD, resting comfortably Slight scleral icterus noted CV: RRR no murmurs rubs or  gallops Lungs: CTAB no crackles, wheeze, rhonchi (improved exam from yesterday with intermittent rhonchi) Abdomen: soft/nontender/nondistended/normal bowel sounds. No rebound or guarding. No obvious hepatomegaly.  Ext: no edema Skin: warm, dry, no rash   Assessment/Plan:  High risk patient with long term smoking, rectal cancer with mets to liver and lungs, CKD.   Anemia- On labs he was found to have elevated LFTs with known mets to the liver from his rectal cancer. His hgb had dropped to 8.4 from 11.4 and initial poc levels here were 6 and 7.2. I considered sending him to ED for transfusion but consulted with Dr. Benay Spice by phone and we planned  For stat CBC and if < 7.5 to send to cancer center for transfusion. Fortunately hgb came back at 8.6 and instructed patient to wait to see Dr. Benay Spice next Wednesday as planned unless he had new or worsening symptoms over weekend of shortness of breath, fatigue, or new chest pain.   Regarding shortness of breath, cough, wheeze- these are much improved. Likely reflected URI which patient is improving from. We decided to hold off on therapies planned from yesterdays visit unless there is worsening over weekend.   Elevated LFTs, bilirubin- plan for RUQ u/s on Wednesday AM, potential obstruction from cancer or could simply be due to the cancer itself. He will have follow up with oncology after the ultrasound and hopeful a report will be available for their review.   While his labs look very poor today, clinically he looks much improved. We believed it was safe to proceed with outpatient evaluation with strict return precautions given. His potassium was low again and I instructed him to take 40 meq x2 for 2 days in a row- called in through [facility] (known CKD but already on daily potassium)  Stirct ED Return precautions advised.           Please note, I would normally use problem oriented charting but this was limited by epic only responding to typing  today and being minimally responsive to button clicks.

## 2015-02-23 NOTE — Telephone Encounter (Signed)
Lab and [place] entered

## 2015-02-23 NOTE — Telephone Encounter (Signed)
Patients three Rx's were called to [facility] and the need to go to [place] please.

## 2015-02-26 ENCOUNTER — Ambulatory Visit: Payer: Medicare Other | Admitting: Family Medicine

## 2015-02-27 ENCOUNTER — Other Ambulatory Visit: Payer: Medicare Other

## 2015-02-28 ENCOUNTER — Emergency Department (HOSPITAL_COMMUNITY): Payer: Medicare Other

## 2015-02-28 ENCOUNTER — Emergency Department (HOSPITAL_COMMUNITY)
Admission: EM | Admit: 2015-02-28 | Discharge: 2015-02-28 | Disposition: A | Discharge status: Home / Self Care | Payer: Medicare Other | Attending: Emergency Medicine | Admitting: Emergency Medicine

## 2015-02-28 ENCOUNTER — Telehealth: Payer: Self-pay | Admitting: *Deleted

## 2015-02-28 ENCOUNTER — Other Ambulatory Visit: Payer: Medicare Other

## 2015-02-28 ENCOUNTER — Ambulatory Visit: Payer: Medicare Other

## 2015-02-28 ENCOUNTER — Encounter (HOSPITAL_COMMUNITY): Payer: Self-pay

## 2015-02-28 ENCOUNTER — Ambulatory Visit: Payer: Medicare Other | Admitting: Oncology

## 2015-02-28 DIAGNOSIS — Z8739 Personal history of other diseases of the musculoskeletal system and connective tissue: Secondary | ICD-10-CM | POA: Insufficient documentation

## 2015-02-28 DIAGNOSIS — Z79899 Other long term (current) drug therapy: Secondary | ICD-10-CM | POA: Insufficient documentation

## 2015-02-28 DIAGNOSIS — R531 Weakness: Secondary | ICD-10-CM | POA: Diagnosis present

## 2015-02-28 DIAGNOSIS — C3490 Malignant neoplasm of unspecified part of unspecified bronchus or lung: Secondary | ICD-10-CM | POA: Diagnosis not present

## 2015-02-28 DIAGNOSIS — Z720 Tobacco use: Secondary | ICD-10-CM | POA: Diagnosis not present

## 2015-02-28 DIAGNOSIS — Z87448 Personal history of other diseases of urinary system: Secondary | ICD-10-CM | POA: Insufficient documentation

## 2015-02-28 DIAGNOSIS — R0602 Shortness of breath: Secondary | ICD-10-CM | POA: Diagnosis not present

## 2015-02-28 DIAGNOSIS — J189 Pneumonia, unspecified organism: Secondary | ICD-10-CM

## 2015-02-28 DIAGNOSIS — Z8669 Personal history of other diseases of the nervous system and sense organs: Secondary | ICD-10-CM | POA: Insufficient documentation

## 2015-02-28 DIAGNOSIS — J441 Chronic obstructive pulmonary disease with (acute) exacerbation: Secondary | ICD-10-CM | POA: Insufficient documentation

## 2015-02-28 DIAGNOSIS — Z7951 Long term (current) use of inhaled steroids: Secondary | ICD-10-CM | POA: Diagnosis not present

## 2015-02-28 DIAGNOSIS — Z87442 Personal history of urinary calculi: Secondary | ICD-10-CM | POA: Insufficient documentation

## 2015-02-28 DIAGNOSIS — C785 Secondary malignant neoplasm of large intestine and rectum: Secondary | ICD-10-CM | POA: Insufficient documentation

## 2015-02-28 DIAGNOSIS — I10 Essential (primary) hypertension: Secondary | ICD-10-CM | POA: Diagnosis not present

## 2015-02-28 DIAGNOSIS — R918 Other nonspecific abnormal finding of lung field: Secondary | ICD-10-CM | POA: Diagnosis not present

## 2015-02-28 DIAGNOSIS — J159 Unspecified bacterial pneumonia: Secondary | ICD-10-CM | POA: Diagnosis not present

## 2015-02-28 DIAGNOSIS — R011 Cardiac murmur, unspecified: Secondary | ICD-10-CM | POA: Insufficient documentation

## 2015-02-28 DIAGNOSIS — E876 Hypokalemia: Secondary | ICD-10-CM | POA: Diagnosis not present

## 2015-02-28 DIAGNOSIS — C7802 Secondary malignant neoplasm of left lung: Secondary | ICD-10-CM | POA: Diagnosis not present

## 2015-02-28 DIAGNOSIS — F17210 Nicotine dependence, cigarettes, uncomplicated: Secondary | ICD-10-CM | POA: Diagnosis not present

## 2015-02-28 LAB — DIFFERENTIAL
BASOS ABS: 0 10*3/uL (ref 0.0–0.1)
BASOS PCT: 0 % (ref 0–1)
Eosinophils Absolute: 0 10*3/uL (ref 0.0–0.7)
Eosinophils Relative: 0 % (ref 0–5)
LYMPHS ABS: 0.5 10*3/uL — AB (ref 0.7–4.0)
Lymphocytes Relative: 6 % — ABNORMAL LOW (ref 12–46)
MONOS PCT: 6 % (ref 3–12)
Monocytes Absolute: 0.5 10*3/uL (ref 0.1–1.0)
Neutro Abs: 7 10*3/uL (ref 1.7–7.7)
Neutrophils Relative %: 88 % — ABNORMAL HIGH (ref 43–77)

## 2015-02-28 LAB — HEPATIC FUNCTION PANEL
ALBUMIN: 2.1 g/dL — AB (ref 3.5–5.2)
ALT: 89 U/L — ABNORMAL HIGH (ref 0–53)
AST: 153 U/L — ABNORMAL HIGH (ref 0–37)
Alkaline Phosphatase: 813 U/L — ABNORMAL HIGH (ref 39–117)
BILIRUBIN DIRECT: 2.1 mg/dL — AB (ref 0.0–0.5)
Indirect Bilirubin: 1.5 mg/dL — ABNORMAL HIGH (ref 0.3–0.9)
TOTAL PROTEIN: 7 g/dL (ref 6.0–8.3)
Total Bilirubin: 3.6 mg/dL — ABNORMAL HIGH (ref 0.3–1.2)

## 2015-02-28 LAB — CBC
HCT: 27.8 % — ABNORMAL LOW (ref 39.0–52.0)
Hemoglobin: 8.9 g/dL — ABNORMAL LOW (ref 13.0–17.0)
MCH: 31 pg (ref 26.0–34.0)
MCHC: 32 g/dL (ref 30.0–36.0)
MCV: 96.9 fL (ref 78.0–100.0)
Platelets: 320 10*3/uL (ref 150–400)
RBC: 2.87 MIL/uL — ABNORMAL LOW (ref 4.22–5.81)
RDW: 17.3 % — AB (ref 11.5–15.5)
WBC: 8.4 10*3/uL (ref 4.0–10.5)

## 2015-02-28 LAB — BASIC METABOLIC PANEL
ANION GAP: 10 (ref 5–15)
BUN: 28 mg/dL — ABNORMAL HIGH (ref 6–23)
CO2: 23 mmol/L (ref 19–32)
Calcium: 8.1 mg/dL — ABNORMAL LOW (ref 8.4–10.5)
Chloride: 103 mmol/L (ref 96–112)
Creatinine, Ser: 1.5 mg/dL — ABNORMAL HIGH (ref 0.50–1.35)
GFR calc Af Amer: 54 mL/min — ABNORMAL LOW (ref >90)
GFR calc non Af Amer: 47 mL/min — ABNORMAL LOW (ref >90)
Glucose, Bld: 120 mg/dL — ABNORMAL HIGH (ref 70–99)
Potassium: 2.9 mmol/L — ABNORMAL LOW (ref 3.5–5.1)
Sodium: 136 mmol/L (ref 135–145)

## 2015-02-28 LAB — BRAIN NATRIURETIC PEPTIDE: B Natriuretic Peptide: 143.1 pg/mL — ABNORMAL HIGH (ref 0.0–100.0)

## 2015-02-28 LAB — CBG MONITORING, ED: Glucose-Capillary: 109 mg/dL — ABNORMAL HIGH (ref 70–99)

## 2015-02-28 LAB — I-STAT TROPONIN, ED: TROPONIN I, POC: 0.02 ng/mL (ref 0.00–0.08)

## 2015-02-28 MED ORDER — IOHEXOL 350 MG/ML SOLN
100.0000 mL | Freq: Once | INTRAVENOUS | Status: AC | PRN
  Administered 2015-02-28: 80 mL via INTRAVENOUS

## 2015-02-28 MED ORDER — POTASSIUM CHLORIDE ER 20 MEQ PO TBCR: 20.0000 meq | EXTENDED_RELEASE_TABLET | Freq: Three times a day (TID) | ORAL | Status: AC

## 2015-02-28 MED ORDER — ALBUTEROL SULFATE (2.5 MG/3ML) 0.083% IN NEBU
2.5000 mg | INHALATION_SOLUTION | Freq: Once | RESPIRATORY_TRACT | Status: AC
  Administered 2015-02-28: 2.5 mg via RESPIRATORY_TRACT
  Filled 2015-02-28: qty 3

## 2015-02-28 MED ORDER — POTASSIUM CHLORIDE CRYS ER 20 MEQ PO TBCR
40.0000 meq | EXTENDED_RELEASE_TABLET | Freq: Once | ORAL | Status: AC
  Administered 2015-02-28: 40 meq via ORAL
  Filled 2015-02-28: qty 2

## 2015-02-28 MED ORDER — LEVOFLOXACIN 500 MG PO TABS: 500.0000 mg | ORAL_TABLET | Freq: Every day | ORAL | Status: AC

## 2015-02-28 MED ORDER — LEVOFLOXACIN 750 MG PO TABS
750.0000 mg | ORAL_TABLET | Freq: Once | ORAL | Status: AC
  Administered 2015-02-28: 750 mg via ORAL
  Filled 2015-02-28: qty 1

## 2015-02-28 MED ORDER — POTASSIUM CHLORIDE 10 MEQ/100ML IV SOLN
10.0000 meq | Freq: Once | INTRAVENOUS | Status: AC
  Administered 2015-02-28: 10 meq via INTRAVENOUS
  Filled 2015-02-28: qty 100

## 2015-02-28 NOTE — ED Notes (Signed)
Charting error, no emtala needed

## 2015-02-28 NOTE — Telephone Encounter (Signed)
Called pt to follow up on missed appointments. He reports he is headed to the emergency room at [facility]. He is feeling lightheaded and weak. Will make Dr. Benay Spice aware.

## 2015-02-28 NOTE — ED Notes (Signed)
Patient states he began having dizziness, SOB, and weakness x 1 week, but today was worse. Patient also c/o diarrhea x 4 this AM. Patient denies fever, N/V. Patient currently receiving chemo for colon cancer, but has not had a treatment in 8 weeks.

## 2015-02-28 NOTE — ED Provider Notes (Signed)
CSN: [id number]     Arrival date & time 02/28/15  1109 History   First MD Initiated Contact with Patient 02/28/15 1134     Chief Complaint  Patient presents with  . Weakness  . Dizziness      HPI  She presents for valuation of a cough, weakness, short of breath. Seen by his primary care physician week it was similar symptoms. Had outpatient labs obtained. Given prescription for Levaquin for his cough. Return for reevaluation next day. His blood count was adequate. His potassium was slightly low. Placed on oral potassium. He had improved and had no dyspnea or cough assist physician did not have him fill his prescription for antibiotics. Symptoms have returned. Has developed more of a cough. States he gets short of breath most of the time ambulating. He still functional and is home. Does not have any hemoptysis. Minimal production of sputum with his cough. No fevers no chills. Had a few episodes of diarrhea this morning but has had a solid bowel movement since that time. No fever, chills, nausea vomiting.  Review of metastatic rectal cancer. Status post sexual 3 anastomosis. Was getting chemotherapy. Has not had chemotherapy in over 8 weeks.  Past Medical History  Diagnosis Date  . Hypertension   . Abnormal EKG 11/01/11    REPORT IN EPIC-PT STATES NO KNOWN HEART PROBLEMS-NO CHEST PAINS  . 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 Low blood potassium     POTASSIUM WAS 2.1 ON 11/01/11 VISIT TO ER--PT PUT ON ORAL POTASSIUM--MOST RECENT LAB 11/24/11 POTASSIUM IMPROVED TO 3.1  . History of radiation therapy 09/29/11-11/07/11    rectal ca  . COPD (chronic obstructive pulmonary disease)     prn inhaler  . Heart murmur     SINCE BIRTH--DOES NOT CAUSE ANY PROBLEMS  . Dyspnea     no recent bronchitis  . Recurrent upper respiratory infection (URI)     HEAD COLD   . Sleep apnea     stop-bang score =6 (NO SLEEP STUDY DONE YET)  . No pertinent past medical history     2 SPOTS ON LIVER JUST WATCHING   . Hydronephrosis of right kidney     "Chronic" -SECONDARY TO CALCULUS (STENT RT URETER) MID RT URETER AND ADDITIONAL CALCULI WITHIN RT KIDNEY--PT STATES HE HAS PASSED A STONE IN THE PAST--DID NOT KNOW HE HAD MORE STONES UNTIL CT ABD DONE ON 09/03/11  . Hydroureter, right   . Nephrolithiasis   . Rectal cancer     HAS COMPLETED RADIATION AND XELODA--PLAN RESTAGING TESTING.  DR. Benay Spice AND DR. MANNING AT [facility]  . Cancer     rectal adenocarcinoma stage [id number]   Past Surgical History  Procedure Laterality Date  . Wrist and hand surgery  yrs ago    LEFT WRIST AND RIGHT HAND--GANGLION LW  AND FX OF RT HAS  . Right hand surgery -repair fracture  yrs ago  . Removal ganglion cyst  yrs ago  . Cystoscopy with stent placment  jan 2013  . Appendectomy  02/06/2012    Procedure: APPENDECTOMY;  Surgeon: Shann Medal, MD;  Location: [facility];  Service: General;;  . Laparotomy  02/06/2012    Procedure: EXPLORATORY LAPAROTOMY;  Surgeon: Shann Medal, MD;  Location: [facility];  Service: General;;  . Ureterolithotomy  02/06/2012    Procedure: URETEROLITHOTOMY;  Surgeon: Hanley Ben, MD;  Location: [facility];  Service: Urology;  Laterality: Right;  open exploration of right ureter,  double J stent exchange, attempted ureteroscopy  . Colostomy    . Portacath placement  03/10/2012    Procedure: INSERTION PORT-A-CATH;  Surgeon: Shann Medal, MD;  Location: [facility];  Service: General;  Laterality: N/A;  . Colon surgery    . Ileo loop colostomy closure  05/05/2012    Procedure: LAPAROSCOPIC ILEO LOOP COLOSTOMY CLOSURE;  Surgeon: Shann Medal, MD;  Location: [facility];  Service: General;  Laterality: N/A;  Laparoscopic Assisted Ileostomy reversal  . Metastatic recatal cnacer      liver and lung   Family History  Problem Relation Age of Onset  . Lupus    . Anemia Mother   . Hypertension Father   . Kidney disease Father 8    renal failure  . Lupus Sister   . Lupus Brother   . Leukemia Mother      age 36   History  Substance Use Topics  . Smoking status: Current Every Day Smoker -- 1.00 packs/day for 46 years    Types: Cigarettes  . Smokeless tobacco: Never Used  . Alcohol Use: No    Review of Systems  Constitutional: Negative for fever, chills, diaphoresis, appetite change and fatigue.  HENT: Negative for mouth sores, sore throat and trouble swallowing.   Eyes: Negative for visual disturbance.  Respiratory: Positive for cough and shortness of breath. Negative for chest tightness and wheezing.   Cardiovascular: Negative for chest pain.  Gastrointestinal: Negative for nausea, vomiting, abdominal pain, diarrhea and abdominal distention.  Endocrine: Negative for polydipsia, polyphagia and polyuria.  Genitourinary: Negative for dysuria, frequency and hematuria.  Musculoskeletal: Negative for gait problem.  Skin: Negative for color change, pallor and rash.  Neurological: Positive for weakness. Negative for dizziness, syncope, light-headedness and headaches.  Hematological: Does not bruise/bleed easily.  Psychiatric/Behavioral: Negative for behavioral problems and confusion.      Allergies  Review of patient's allergies indicates no known allergies.  Home Medications   Prior to Admission medications   Medication Sig Start Date End Date Taking? Authorizing Provider  acetaminophen (TYLENOL) 325 MG tablet Take 650 mg by mouth every 6 (six) hours as needed.   Yes Historical Provider, MD  amLODipine (NORVASC) 10 MG tablet TAKE 1 TABLET (10 MG TOTAL) BY MOUTH DAILY. 11/09/14  Yes Marin Olp, MD  diphenoxylate-atropine (LOMOTIL) 2.5-0.025 MG per tablet Take 1 tablet by mouth 4 (four) times daily as needed for diarrhea or loose stools. 09/27/13  Yes Ladell Pier, MD  fluticasone (FLONASE) 50 MCG/ACT nasal spray Place 1 spray into both nostrils daily.  01/09/14  Yes Historical Provider, MD  pantoprazole (PROTONIX) 40 MG tablet Take 1 tablet (40 mg total) by mouth 2 (two) times  daily. 01/29/15  Yes Owens Shark, NP  potassium chloride SA (KLOR-CON M20) 20 MEQ tablet Take 1 tablet (20 mEq total) by mouth daily. 04/28/14  Yes Ricard Dillon, MD  prochlorperazine (COMPAZINE) 10 MG tablet Take 10 mg by mouth every 6 (six) hours as needed for nausea or vomiting.   Yes Ladell Pier, MD  terazosin (HYTRIN) 10 MG capsule Take 10 mg by mouth at bedtime.   Yes Historical Provider, MD  albuterol (PROVENTIL HFA;VENTOLIN HFA) 108 (90 BASE) MCG/ACT inhaler Inhale 2 puffs into the lungs every 4 (four) hours as needed for wheezing or shortness of breath. Patient not taking: Reported on 02/23/2015 02/23/15   Marin Olp, MD  capecitabine (XELODA) 500 MG tablet Take 3 tab (=1500mg ) in AM; Take 3  tab (=1500mg ) in PM.  Total daily dose of 3000mg  for 14 days on and 7 days off Start 02/21/15 Patient not taking: Reported on 02/23/2015 02/23/15   Marin Olp, MD  levofloxacin (LEVAQUIN) 500 MG tablet Take 1 tablet (500 mg total) by mouth daily. 02/28/15   Tanna Furry, MD  potassium chloride 20 MEQ TBCR Take 20 mEq by mouth 3 (three) times daily. 02/28/15   Tanna Furry, MD  predniSONE (DELTASONE) 20 MG tablet Take 1 tablet (20 mg total) by mouth daily with breakfast. Patient not taking: Reported on 02/23/2015 02/23/15   Marin Olp, MD  terazosin (HYTRIN) 5 MG capsule Take 1 capsule (5 mg total) by mouth at bedtime. 07/14/14   Marin Olp, MD  traMADol (ULTRAM) 50 MG tablet Take 1 tablet (50 mg total) by mouth every 12 (twelve) hours as needed. Patient not taking: Reported on 02/22/2015 01/29/15   Owens Shark, NP   BP 179/84 mmHg  Pulse 87  Temp(Src) 97.4 F (36.3 C) (Oral)  Resp 21  SpO2 93% Physical Exam  Constitutional: He is oriented to person, place, and time. He appears well-developed and well-nourished. No distress.  HENT:  Head: Normocephalic.  Eyes: Conjunctivae are normal. Pupils are equal, round, and reactive to light. No scleral icterus.  Neck: Normal range of  motion. Neck supple. No thyromegaly present.  Cardiovascular: Normal rate and regular rhythm.  Exam reveals no gallop and no friction rub.   No murmur heard. Pulmonary/Chest: Effort normal and breath sounds normal. No respiratory distress. He has no wheezes. He has no rales.  Visual cough. No focal diminished breath sounds. No wheezing rales rhonchi or prolongation. No swelling to his extremities.  Abdominal: Soft. Bowel sounds are normal. He exhibits no distension. There is no tenderness. There is no rebound.  Musculoskeletal: Normal range of motion.  Neurological: He is alert and oriented to person, place, and time.  Skin: Skin is warm and dry. No rash noted.  Psychiatric: He has a normal mood and affect. His behavior is normal.    ED Course  Procedures (including critical care time) Labs Review Labs Reviewed  CBC - Abnormal; Notable for the following:    RBC 2.87 (*)    Hemoglobin 8.9 (*)    HCT 27.8 (*)    RDW 17.3 (*)    All other components within normal limits  BRAIN NATRIURETIC PEPTIDE - Abnormal; Notable for the following:    B Natriuretic Peptide 143.1 (*)    All other components within normal limits  BASIC METABOLIC PANEL - Abnormal; Notable for the following:    Potassium 2.9 (*)    Glucose, Bld 120 (*)    BUN 28 (*)    Creatinine, Ser 1.50 (*)    Calcium 8.1 (*)    GFR calc non Af Amer 47 (*)    GFR calc Af Amer 54 (*)    All other components within normal limits  HEPATIC FUNCTION PANEL - Abnormal; Notable for the following:    Albumin 2.1 (*)    AST 153 (*)    ALT 89 (*)    Alkaline Phosphatase 813 (*)    Total Bilirubin 3.6 (*)    Bilirubin, Direct 2.1 (*)    Indirect Bilirubin 1.5 (*)    All other components within normal limits  DIFFERENTIAL - Abnormal; Notable for the following:    Neutrophils Relative % 88 (*)    Lymphocytes Relative 6 (*)    Lymphs Abs 0.5 (*)  All other components within normal limits  CBG MONITORING, ED - Abnormal; Notable for  the following:    Glucose-Capillary 109 (*)    All other components within normal limits  I-STAT TROPOININ, ED    Imaging Review Dg Chest 2 View  02/28/2015   CLINICAL DATA:  Increasing shortness of breath and weakness for the last few days. History of colorectal carcinoma. Recent diarrhea.  EXAM: CHEST  2 VIEW  COMPARISON:  Multiple exams, including 12/19/2014  FINDINGS: Left-sided power injectable Port-A-Cath noted, tip projecting over the lower SVC. Emphysema is present. Superior segment left lower lobe perihilar mass identified. Medial left lower lobe mass observed in the retrocardiac position with some indistinct airspace opacity peripheral to it. This airspace opacity is new compared to the prior chest CT.  Heart size within normal limits.  IMPRESSION: 1. New airspace opacity in the left lower lobe peripheral to the known basilar left lower lobe lung mass, possibly pneumonia or postobstructive pneumonitis. 2. Superior segment left lower lobe perihilar mass.   Electronically Signed   By: Van Clines M.D.   On: 02/28/2015 13:04   Ct Angio Chest Pe W/cm &/or Wo Cm  02/28/2015   CLINICAL DATA:  Acute shortness of breath, weakness, fever for 1 week. Abnormal chest x-ray concerning for new left lower lobe airspace disease. Metastatic colorectal cancer.  EXAM: CT ANGIOGRAPHY CHEST WITH CONTRAST  TECHNIQUE: Multidetector CT imaging of the chest was performed using the standard protocol during bolus administration of intravenous contrast. Multiplanar CT image reconstructions and MIPs were obtained to evaluate the vascular anatomy.  CONTRAST:  72mL OMNIPAQUE IOHEXOL 350 MG/ML SOLN  COMPARISON:  12/19/2014  FINDINGS: No supraclavicular or axillary adenopathy. Left subclavian port catheter tip extends to the SVC. Atherosclerosis of the aorta. Major branch vessels remain patent. Normal heart size. Small pericardial effusion evident. Enlargement of the anterior pericardial lymph nodes, index lymph node  measuring 16 x 13 mm, image 74 compatible with progression of metastatic disease. Small hiatal hernia noted.  Included upper abdomen demonstrates upper abdominal ascites. 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 Enlargement of the ill-defined dominant hypodense mass in the left hepatic lobe lateral segment measuring 8.6 x 8.1 cm. This appears to extend into the caudate. Additional hepatic dome mass on the right has enlarged measuring 33 x 29 mm, previously 31 x 25 mm. Slight increased biliary dilatation within the imaged liver.  Central pulmonary arteries appear patent. No significant filling defect or pulmonary embolus demonstrated by CTA. No evidence of right heart failure.  Lung windows demonstrate mild background emphysema. There is an enlarging posterior nodule in the right upper lobe abutting the pleura measuring 10 mm compatible with a pulmonary metastasis. Spiculated nodular left lower lobe hilar mass extends into the mediastinum and surrounds the left lower lobe proximal segmental bronchi and the left lower lobe pulmonary artery. This roughly measures 45 x 32 mm, previously 46 x 41 mm. Posterior medially within the right lower lobe, there is evidence of increased traction bronchiectasis and collapse/consolidation, which encompasses a second right lower lobe medial pulmonary mass. This is compatible with postobstructive collapse/consolidation or pneumonitis. Minor adjacent basilar atelectasis. Trace pleural effusions bilaterally. Minor right lower lobe atelectasis.  Right lower lobe micronodular tree and bud pattern also evident diffusely which could represent an early infectious process.  No acute osseous finding or acute compression fracture. Degenerative changes of the sternomanubrial junction.  Review of the MIP images confirms the above findings.  IMPRESSION: No significant acute  pulmonary embolus demonstrated by CTA.  Left lower lobe dominant infrahilar  mass with interval development of peripheral postobstructive collapse/ consolidation or pneumonitis, which now encompasses the second left lower lobe mass.  Enlarging left upper lobe pulmonary metastasis  Background COPD/ emphysema  Scattered micro nodularity and new tree-in-bud pattern throughout the right lower lobe could represent a superimposed early infectious process.  Progression of upper abdominal metastatic disease with enlarging liver lesions, ascites, and peritoneal implants.  Enlarging metastatic anterior pericardial lymph nodes.   Electronically Signed   By: Jerilynn Mages.  Shick M.D.   On: 02/28/2015 15:22     EKG Interpretation None      MDM   Final diagnoses:  SOB (shortness of breath)  Community acquired pneumonia  Hypokalemia  Malignant neoplasm of lung, unspecified laterality, unspecified part of lung    Patient has new abnormality on plain chest x-ray. CT shows mass with postobstructive pneumonitis. He is afebrile. He does not have leukocytosis. He is not hypoxemic. His potassium is 2.9. Supplemented with IV, and by mouth potassium. On my initial reevaluation with him with discussion of studies are recommended admission. He is fairly adamant that he would like to be home. Do not think this is unreasonable. He is well oxygenated. Given an incentive spirometer. Prescription for Levaquin. I'll increase his potassium to 3 times a day and asked to follow up with his physician within the next 2-3 days. Vascular recheck here if any worsening symptoms or failure to improve.    Tanna Furry, MD 02/28/15 1620

## 2015-02-28 NOTE — ED Notes (Signed)
RT called

## 2015-02-28 NOTE — ED Notes (Signed)
Off floor for testing 

## 2015-02-28 NOTE — Discharge Instructions (Signed)
Pneumonia Pneumonia is an infection of the lungs.  CAUSES Pneumonia may be caused by bacteria or a virus. Usually, these infections are caused by breathing infectious particles into the lungs (respiratory tract). SIGNS AND SYMPTOMS   Cough.  Fever.  Chest pain.  Increased rate of breathing.  Wheezing.  Mucus production. DIAGNOSIS  If you have the common symptoms of pneumonia, your health care provider will typically confirm the diagnosis with a chest X-ray. The X-ray will show an abnormality in the lung (pulmonary infiltrate) if you have pneumonia. Other tests of your blood, urine, or sputum may be done to find the specific cause of your pneumonia. Your health care provider may also do tests (blood gases or pulse oximetry) to see how well your lungs are working. TREATMENT  Some forms of pneumonia may be spread to other people when you cough or sneeze. You may be asked to wear a mask before and during your exam. Pneumonia that is caused by bacteria is treated with antibiotic medicine. Pneumonia that is caused by the influenza virus may be treated with an antiviral medicine. Most other viral infections must run their course. These infections will not respond to antibiotics.  HOME CARE INSTRUCTIONS   Cough suppressants may be used if you are losing too much rest. However, coughing protects you by clearing your lungs. You should avoid using cough suppressants if you can.  Your health care provider may have prescribed medicine if he or she thinks your pneumonia is caused by bacteria or influenza. Finish your medicine even if you start to feel better.  Your health care provider may also prescribe an expectorant. This loosens the mucus to be coughed up.  Take medicines only as directed by your health care provider.  Do not smoke. Smoking is a common cause of bronchitis and can contribute to pneumonia. If you are a smoker and continue to smoke, your cough may last several weeks after your  pneumonia has cleared.  A cold steam vaporizer or humidifier in your room or home may help loosen mucus.  Coughing is often worse at night. Sleeping in a semi-upright position in a recliner or using a couple pillows under your head will help with this.  Get rest as you feel it is needed. Your body will usually let you know when you need to rest. PREVENTION A pneumococcal shot (vaccine) is available to prevent a common bacterial cause of pneumonia. This is usually suggested for:  People over 65 years old.  Patients on chemotherapy.  People with chronic lung problems, such as bronchitis or emphysema.  People with immune system problems. If you are over 65 or have a high risk condition, you may receive the pneumococcal vaccine if you have not received it before. In some countries, a routine influenza vaccine is also recommended. This vaccine can help prevent some cases of pneumonia.You may be offered the influenza vaccine as part of your care. If you smoke, it is time to quit. You may receive instructions on how to stop smoking. Your health care provider can provide medicines and counseling to help you quit. SEEK MEDICAL CARE IF: You have a fever. SEEK IMMEDIATE MEDICAL CARE IF:   Your illness becomes worse. This is especially true if you are elderly or weakened from any other disease.  You cannot control your cough with suppressants and are losing sleep.  You begin coughing up blood.  You develop pain which is getting worse or is uncontrolled with medicines.  Any of the symptoms   which initially brought you in for treatment are getting worse rather than better.  You develop shortness of breath or chest pain. MAKE SURE YOU:   Understand these instructions.  Will watch your condition.  Will get help right away if you are not doing well or get worse. Document Released: 11/24/2005 Document Revised: 04/10/2014 Document Reviewed: 02/13/2011 [facility] Patient Information 2015  [place], [place]. This information is not intended to replace advice given to you by your health care provider. Make sure you discuss any questions you have with your health care provider.  Hypokalemia Hypokalemia means that the amount of potassium in the blood is lower than normal.Potassium is a chemical, called an electrolyte, that helps regulate the amount of fluid in the body. It also stimulates muscle contraction and helps nerves function properly.Most of the body's potassium is inside of cells, and only a very small amount is in the blood. Because the amount in the blood is so small, minor changes can be life-threatening. CAUSES  Antibiotics.  Diarrhea or vomiting.  Using laxatives too much, which can cause diarrhea.  Chronic kidney disease.  Water pills (diuretics).  Eating disorders (bulimia).  Low magnesium level.  Sweating a lot. SIGNS AND SYMPTOMS  Weakness.  Constipation.  Fatigue.  Muscle cramps.  Mental confusion.  Skipped heartbeats or irregular heartbeat (palpitations).  Tingling or numbness. DIAGNOSIS  Your health care provider can diagnose hypokalemia with blood tests. In addition to checking your potassium level, your health care provider may also check other lab tests. TREATMENT Hypokalemia can be treated with potassium supplements taken by mouth or adjustments in your current medicines. If your potassium level is very low, you may need to get potassium through a vein (IV) and be monitored in the hospital. A diet high in potassium is also helpful. Foods high in potassium are:  Nuts, such as peanuts and pistachios.  Seeds, such as sunflower seeds and pumpkin seeds.  Peas, lentils, and lima beans.  Whole grain and bran cereals and breads.  Fresh fruit and vegetables, such as apricots, avocado, bananas, cantaloupe, kiwi, oranges, tomatoes, asparagus, and potatoes.  Orange and tomato juices.  Red meats.  Fruit yogurt. HOME CARE  INSTRUCTIONS  Take all medicines as prescribed by your health care provider.  Maintain a healthy diet by including nutritious food, such as fruits, vegetables, nuts, whole grains, and lean meats.  If you are taking a laxative, be sure to follow the directions on the label. SEEK MEDICAL CARE IF:  Your weakness gets worse.  You feel your heart pounding or racing.  You are vomiting or having diarrhea.  You are diabetic and having trouble keeping your blood glucose in the normal range. SEEK IMMEDIATE MEDICAL CARE IF:  You have chest pain, shortness of breath, or dizziness.  You are vomiting or having diarrhea for more than 2 days.  You faint. MAKE SURE YOU:   Understand these instructions.  Will watch your condition.  Will get help right away if you are not doing well or get worse. Document Released: 11/24/2005 Document Revised: 09/14/2013 Document Reviewed: 05/27/2013 [facility] Patient Information 2015 [place], [place]. This information is not intended to replace advice given to you by your health care provider. Make sure you discuss any questions you have with your health care provider.

## 2015-03-01 ENCOUNTER — Other Ambulatory Visit: Payer: Self-pay

## 2015-03-01 ENCOUNTER — Ambulatory Visit: Payer: Medicare Other | Admitting: Family Medicine

## 2015-03-01 MED ORDER — ALBUTEROL SULFATE HFA 108 (90 BASE) MCG/ACT IN AERS: 2.0000 | INHALATION_SPRAY | RESPIRATORY_TRACT | Status: AC | PRN

## 2015-03-02 ENCOUNTER — Telehealth: Payer: Self-pay | Admitting: *Deleted

## 2015-03-02 ENCOUNTER — Encounter: Payer: Self-pay | Admitting: Oncology

## 2015-03-02 ENCOUNTER — Telehealth: Payer: Self-pay | Admitting: Nurse Practitioner

## 2015-03-02 NOTE — Telephone Encounter (Signed)
Called and left a message with 3/28 appointment information

## 2015-03-02 NOTE — Telephone Encounter (Signed)
Attempted to call pt with appointment for 3/28. No answer. Order sent to schedulers for appointment.

## 2015-03-02 NOTE — Progress Notes (Signed)
Per Truman Hayward at [facility] advised no funds for Xeloda asst. I will let Dr. Benay Spice know. The copy [phone] advised can't pay

## 2015-03-03 ENCOUNTER — Other Ambulatory Visit: Payer: Self-pay | Admitting: Oncology

## 2015-03-05 ENCOUNTER — Ambulatory Visit (HOSPITAL_BASED_OUTPATIENT_CLINIC_OR_DEPARTMENT_OTHER): Payer: Medicare Other

## 2015-03-05 ENCOUNTER — Ambulatory Visit (HOSPITAL_BASED_OUTPATIENT_CLINIC_OR_DEPARTMENT_OTHER): Payer: Medicare Other | Admitting: Nurse Practitioner

## 2015-03-05 ENCOUNTER — Other Ambulatory Visit (HOSPITAL_BASED_OUTPATIENT_CLINIC_OR_DEPARTMENT_OTHER): Payer: Medicare Other

## 2015-03-05 ENCOUNTER — Telehealth: Payer: Self-pay | Admitting: *Deleted

## 2015-03-05 ENCOUNTER — Encounter: Payer: Self-pay | Admitting: Oncology

## 2015-03-05 VITALS — BP 146/75 | HR 96 | Temp 98.3°F | Resp 28 | Wt 148.4 lb

## 2015-03-05 DIAGNOSIS — C20 Malignant neoplasm of rectum: Secondary | ICD-10-CM

## 2015-03-05 DIAGNOSIS — C7800 Secondary malignant neoplasm of unspecified lung: Secondary | ICD-10-CM

## 2015-03-05 DIAGNOSIS — C787 Secondary malignant neoplasm of liver and intrahepatic bile duct: Secondary | ICD-10-CM

## 2015-03-05 DIAGNOSIS — G893 Neoplasm related pain (acute) (chronic): Secondary | ICD-10-CM

## 2015-03-05 DIAGNOSIS — N183 Chronic kidney disease, stage 3 unspecified: Secondary | ICD-10-CM

## 2015-03-05 DIAGNOSIS — K5900 Constipation, unspecified: Secondary | ICD-10-CM

## 2015-03-05 DIAGNOSIS — R1084 Generalized abdominal pain: Secondary | ICD-10-CM

## 2015-03-05 DIAGNOSIS — R112 Nausea with vomiting, unspecified: Secondary | ICD-10-CM

## 2015-03-05 DIAGNOSIS — E8809 Other disorders of plasma-protein metabolism, not elsewhere classified: Secondary | ICD-10-CM

## 2015-03-05 DIAGNOSIS — R740 Nonspecific elevation of levels of transaminase and lactic acid dehydrogenase [LDH]: Secondary | ICD-10-CM

## 2015-03-05 DIAGNOSIS — E876 Hypokalemia: Secondary | ICD-10-CM

## 2015-03-05 DIAGNOSIS — R7401 Elevation of levels of liver transaminase levels: Secondary | ICD-10-CM

## 2015-03-05 DIAGNOSIS — R232 Flushing: Secondary | ICD-10-CM

## 2015-03-05 LAB — COMPREHENSIVE METABOLIC PANEL (CC13)
ALBUMIN: 1.8 g/dL — AB (ref 3.5–5.0)
ALT: 76 U/L — ABNORMAL HIGH (ref 0–55)
AST: 96 U/L — ABNORMAL HIGH (ref 5–34)
Alkaline Phosphatase: 906 U/L — ABNORMAL HIGH (ref 40–150)
Anion Gap: 12 mEq/L — ABNORMAL HIGH (ref 3–11)
BUN: 25.5 mg/dL (ref 7.0–26.0)
CALCIUM: 8.2 mg/dL — AB (ref 8.4–10.4)
CHLORIDE: 106 meq/L (ref 98–109)
CO2: 22 mEq/L (ref 22–29)
Creatinine: 1.4 mg/dL — ABNORMAL HIGH (ref 0.7–1.3)
EGFR: 59 mL/min/{1.73_m2} — AB (ref >90)
GLUCOSE: 129 mg/dL (ref 70–140)
POTASSIUM: 3.1 meq/L — AB (ref 3.5–5.1)
Sodium: 140 mEq/L (ref 136–145)
Total Bilirubin: 3.46 mg/dL — ABNORMAL HIGH (ref 0.20–1.20)
Total Protein: 6.6 g/dL (ref 6.4–8.3)

## 2015-03-05 LAB — CBC WITH DIFFERENTIAL/PLATELET
BASO%: 0.1 % (ref 0.0–2.0)
BASOS ABS: 0 10*3/uL (ref 0.0–0.1)
EOS%: 0.2 % (ref 0.0–7.0)
Eosinophils Absolute: 0 10*3/uL (ref 0.0–0.5)
HEMATOCRIT: 28.9 % — AB (ref 38.4–49.9)
HEMOGLOBIN: 9.2 g/dL — AB (ref 13.0–17.1)
LYMPH%: 5.7 % — ABNORMAL LOW (ref 14.0–49.0)
MCH: 30.7 pg (ref 27.2–33.4)
MCHC: 31.8 g/dL — ABNORMAL LOW (ref 32.0–36.0)
MCV: 96.3 fL (ref 79.3–98.0)
MONO#: 0.4 10*3/uL (ref 0.1–0.9)
MONO%: 3.7 % (ref 0.0–14.0)
NEUT%: 90.3 % — AB (ref 39.0–75.0)
NEUTROS ABS: 8.9 10*3/uL — AB (ref 1.5–6.5)
PLATELETS: 305 10*3/uL (ref 140–400)
RBC: 3 10*6/uL — ABNORMAL LOW (ref 4.20–5.82)
RDW: 17.1 % — ABNORMAL HIGH (ref 11.0–14.6)
WBC: 9.9 10*3/uL (ref 4.0–10.3)
lymph#: 0.6 10*3/uL — ABNORMAL LOW (ref 0.9–3.3)

## 2015-03-05 LAB — UA PROTEIN, DIPSTICK - CHCC: PROTEIN: 100 mg/dL

## 2015-03-05 MED ORDER — SODIUM CHLORIDE 0.9 % IJ SOLN
10.0000 mL | INTRAMUSCULAR | Status: DC | PRN
  Administered 2015-03-05: 10 mL via INTRAVENOUS
  Filled 2015-03-05: qty 10

## 2015-03-05 MED ORDER — HEPARIN SOD (PORK) LOCK FLUSH 100 UNIT/ML IV SOLN
500.0000 [IU] | Freq: Once | INTRAVENOUS | Status: AC
  Administered 2015-03-05: 500 [IU] via INTRAVENOUS
  Filled 2015-03-05: qty 5

## 2015-03-05 NOTE — Telephone Encounter (Signed)
Per Selena Lesser, NP; [facility] notified of new pt referral.

## 2015-03-05 NOTE — Patient Instructions (Signed)

## 2015-03-05 NOTE — Progress Notes (Signed)
Per Selena Lesser, NP: PAC flush only today, no chemo. PAC flushed without difficulty. AVS/appts given to pt.

## 2015-03-05 NOTE — Progress Notes (Signed)
I called and left Tina at [facility] a message she can close case for patient since no asst is available

## 2015-03-06 ENCOUNTER — Encounter: Payer: Self-pay | Admitting: Nurse Practitioner

## 2015-03-06 DIAGNOSIS — R740 Nonspecific elevation of levels of transaminase and lactic acid dehydrogenase [LDH]: Secondary | ICD-10-CM

## 2015-03-06 DIAGNOSIS — R112 Nausea with vomiting, unspecified: Secondary | ICD-10-CM | POA: Insufficient documentation

## 2015-03-06 DIAGNOSIS — K5900 Constipation, unspecified: Secondary | ICD-10-CM | POA: Insufficient documentation

## 2015-03-06 DIAGNOSIS — G893 Neoplasm related pain (acute) (chronic): Secondary | ICD-10-CM | POA: Insufficient documentation

## 2015-03-06 DIAGNOSIS — R7401 Elevation of levels of liver transaminase levels: Secondary | ICD-10-CM | POA: Insufficient documentation

## 2015-03-06 DIAGNOSIS — E8809 Other disorders of plasma-protein metabolism, not elsewhere classified: Secondary | ICD-10-CM | POA: Insufficient documentation

## 2015-03-06 LAB — CEA: CEA: 99.8 ng/mL — ABNORMAL HIGH (ref 0.0–5.0)

## 2015-03-06 NOTE — Assessment & Plan Note (Addendum)
Potassium is decreased to 3.1.  Patient already has potassium tablets at home; and was encouraged to continue taking potassium 3 times per day.

## 2015-03-06 NOTE — Assessment & Plan Note (Signed)
Patient complaining of some generalized abdominal discomfort; in and the feeling of bloating.  He has minimal appetite now; stated he only eats one meal per day.  On exam-abdomen remains soft and essentially nontender with palpation.  Bowel sounds positive in all 4 quads.  Questionable trace ascites noted only.

## 2015-03-06 NOTE — Assessment & Plan Note (Signed)
Patient continues to complain of some mild nausea and vomiting.  He states he vomited one chest today.  He typically does not try any anti-emetics.

## 2015-03-06 NOTE — Assessment & Plan Note (Signed)
Liver enzymes and elevated today; with an AST of 96 and ALT of 76.  Will continue to monitor.

## 2015-03-06 NOTE — Assessment & Plan Note (Signed)
Patient has history of chronic renal insufficiency.  Creatinine today is elevated to 1.4.  Will continue to monitor.

## 2015-03-06 NOTE — Assessment & Plan Note (Signed)
Alkaline phosphatase is increased to 906.  Most likely, this is in direct relation to patient's progression of disease.

## 2015-03-06 NOTE — Assessment & Plan Note (Signed)
Patient presented to the [facility] today to receive his initial cycle of FOLFOX chemotherapy.  However, patient appears much weaker today. He is complaining of nausea/vomiting/constipation, abdominal bloating, and questionable mild ascites.  Labs today reveal a hyperbilirubinemia with a bilirubin of 3.46 and an increasing alkaline phosphatase as well.  CEA has increased from 74 up to 99.  On exam-patient appears increasingly weak and uncomfortable.  After reviewing all with patient-decision was made to hold the initiation of chemotherapy today.  Patient is in agreement with a hospice referral for further evaluation.  Patient states that he lives alone; and feels that they may be of great assistance for him.  All further chemotherapy will be held; the patient will return to the [facility] for supportive care on an as-needed basis.

## 2015-03-06 NOTE — Assessment & Plan Note (Signed)
Albumin has decreased to 1.8.  Patient was encouraged to push protein in his diet is much as possible.

## 2015-03-06 NOTE — Assessment & Plan Note (Signed)
Bilirubin has increased to 3.46.  Patient is complaining of some mild, generalized abdominal discomfort; and may also have some trace ascites on exam and scan.

## 2015-03-06 NOTE — Assessment & Plan Note (Signed)
Patient reports constipation for the past 3 days.  He has tried no over-the-counter stool softeners or mural ask.  Advised that he take stool softeners twice daily; and take Maalox every 4-6 hours until his constipation clears.

## 2015-03-06 NOTE — Progress Notes (Signed)
SYMPTOM MANAGEMENT CLINIC   HPI: [name] [name] 64 y.o. male diagnosed with rectal cancer; with both lung and liver metastasis.  Presented to the [facility] today with plans to initiate FOLFOX chemotherapy regimen.  Patient presents to the [facility] today to reinitiate his first cycle FOLFOX chemotherapy.  However, patient reports increasing weakness, generalized abdominal discomfort with abdominal bloating and poor appetite, one episode of nausea/vomiting, and some chronic constipation.  He denies any recent fevers or chills.  He has been taking Tylenol and tramadol for his discomfort.   HPI  ROS  Past Medical History  Diagnosis Date  . Hypertension   . Abnormal EKG 11/01/11    REPORT IN EPIC-PT STATES NO KNOWN HEART PROBLEMS-NO CHEST PAINS  . 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 Low blood potassium     POTASSIUM WAS 2.1 ON 11/01/11 VISIT TO ER--PT PUT ON ORAL POTASSIUM--MOST RECENT LAB 11/24/11 POTASSIUM IMPROVED TO 3.1  . History of radiation therapy 09/29/11-11/07/11    rectal ca  . COPD (chronic obstructive pulmonary disease)     prn inhaler  . Heart murmur     SINCE BIRTH--DOES NOT CAUSE ANY PROBLEMS  . Dyspnea     no recent bronchitis  . Recurrent upper respiratory infection (URI)     HEAD COLD   . Sleep apnea     stop-bang score =6 (NO SLEEP STUDY DONE YET)  . No pertinent past medical history     2 SPOTS ON LIVER JUST WATCHING  . Hydronephrosis of right kidney     "Chronic" -SECONDARY TO CALCULUS (STENT RT URETER) MID RT URETER AND ADDITIONAL CALCULI WITHIN RT KIDNEY--PT STATES HE HAS PASSED A STONE IN THE PAST--DID NOT KNOW HE HAD MORE STONES UNTIL CT ABD DONE ON 09/03/11  . Hydroureter, right   . Nephrolithiasis   . Rectal cancer     HAS COMPLETED RADIATION AND XELODA--PLAN RESTAGING TESTING.  DR. Benay Spice AND DR. MANNING AT [facility]  . Cancer     rectal adenocarcinoma stage [id number]    Past Surgical History  Procedure Laterality Date  .  Wrist and hand surgery  yrs ago    LEFT WRIST AND RIGHT HAND--GANGLION LW  AND FX OF RT HAS  . Right hand surgery -repair fracture  yrs ago  . Removal ganglion cyst  yrs ago  . Cystoscopy with stent placment  jan 2013  . Appendectomy  02/06/2012    Procedure: APPENDECTOMY;  Surgeon: Shann Medal, MD;  Location: [facility];  Service: General;;  . Laparotomy  02/06/2012    Procedure: EXPLORATORY LAPAROTOMY;  Surgeon: Shann Medal, MD;  Location: [facility];  Service: General;;  . Ureterolithotomy  02/06/2012    Procedure: URETEROLITHOTOMY;  Surgeon: Hanley Ben, MD;  Location: [facility];  Service: Urology;  Laterality: Right;  open exploration of right ureter, double J stent exchange, attempted ureteroscopy  . Colostomy    . Portacath placement  03/10/2012    Procedure: INSERTION PORT-A-CATH;  Surgeon: Shann Medal, MD;  Location: [facility];  Service: General;  Laterality: N/A;  . Colon surgery    . Ileo loop colostomy closure  05/05/2012    Procedure: LAPAROSCOPIC ILEO LOOP COLOSTOMY CLOSURE;  Surgeon: Shann Medal, MD;  Location: [facility];  Service: General;  Laterality: N/A;  Laparoscopic Assisted Ileostomy reversal  . Metastatic recatal cnacer      liver and lung    has [employer]; Essential hypertension; Internal hemorrhoids  with other complication; Chronic frontal sinusitis; HYPERSOMNIA, ASSOCIATED WITH SLEEP APNEA; Rectal cancer, approx. 11 cm from anal verge, s/p LAR and loop ileostomy 02/06/12; Liver masses, seen on CT scan; Hydronephrosis of right kidney; Chronic renal insufficiency, stage III (moderate); Lung metastases; Hypokalemia; Constipation; Nausea with vomiting; Cancer associated pain; Hyperbilirubinemia; Hyperphosphatemia; Transaminitis; and Hypoalbuminemia on his problem list.    has No Known Allergies.    Medication List       This list is accurate as of: 03/05/15 11:59 PM.  Always use your most recent med list.               acetaminophen 325 MG tablet  Commonly known as:   TYLENOL  Take 650 mg by mouth every 6 (six) hours as needed.     albuterol 108 (90 BASE) MCG/ACT inhaler  Commonly known as:  PROAIR HFA  Inhale 2 puffs into the lungs every 4 (four) hours as needed for wheezing or shortness of breath.     amLODipine 10 MG tablet  Commonly known as:  NORVASC  TAKE 1 TABLET (10 MG TOTAL) BY MOUTH DAILY.     capecitabine 500 MG tablet  Commonly known as:  XELODA  Take 3 tab (=$Remov'1500mg'hiLxEh$ ) in AM; Take 3 tab (=$Remov'1500mg'tAfhIn$ ) in PM.  Total daily dose of $Remov'3000mg'EIVYmF$  for 14 days on and 7 days off Start 02/21/15     diphenoxylate-atropine 2.5-0.025 MG per tablet  Commonly known as:  LOMOTIL  Take 1 tablet by mouth 4 (four) times daily as needed for diarrhea or loose stools.     fluticasone 50 MCG/ACT nasal spray  Commonly known as:  FLONASE  Place 1 spray into both nostrils daily.     levofloxacin 500 MG tablet  Commonly known as:  LEVAQUIN  Take 1 tablet (500 mg total) by mouth daily.     pantoprazole 40 MG tablet  Commonly known as:  PROTONIX  Take 1 tablet (40 mg total) by mouth 2 (two) times daily.     Potassium Chloride ER 20 MEQ Tbcr  Take 20 mEq by mouth 3 (three) times daily.     predniSONE 20 MG tablet  Commonly known as:  DELTASONE  Take 1 tablet (20 mg total) by mouth daily with breakfast.     prochlorperazine 10 MG tablet  Commonly known as:  COMPAZINE  Take 10 mg by mouth every 6 (six) hours as needed for nausea or vomiting.     terazosin 5 MG capsule  Commonly known as:  HYTRIN  Take 1 capsule (5 mg total) by mouth at bedtime.     traMADol 50 MG tablet  Commonly known as:  ULTRAM  Take 1 tablet (50 mg total) by mouth every 12 (twelve) hours as needed.         PHYSICAL EXAMINATION  Oncology Vitals 03/05/2015 02/28/2015 02/28/2015 02/28/2015 02/28/2015 02/23/2015 02/23/2015  Height - - - - - - -  Weight 67.314 kg - - - - - 70.308 kg  Weight (lbs) 148 lbs 6 oz - - - - - 155 lbs  BMI (kg/m2) - - - - - - -  Temp 98.3 - - - 97.4 - -  Pulse 96 87  90 - 89 92 88  Resp $Rem'28 21 20 'VfbH$ - 20 - -  Resp (Historical as of 07/08/12) - - - - - - -  SpO2 - 93 93 95 97 - 92  BSA (m2) - - - - - - -   BP Readings from  Last 3 Encounters:  03/05/15 146/75  02/28/15 179/84  02/23/15 120/60    Physical Exam  Constitutional: He is oriented to person, place, and time. Vital signs are normal. He appears unhealthy.  Patient appears fatigued, weak, frail, and chronically ill.  HENT:  Head: Normocephalic and atraumatic.  Mouth/Throat: Oropharynx is clear and moist.  Eyes: Conjunctivae and EOM are normal. Pupils are equal, round, and reactive to light. Right eye exhibits no discharge. Left eye exhibits no discharge. Scleral icterus is present.  Bilateral scleral jaundice.  Neck: Normal range of motion. Neck supple. No JVD present. No tracheal deviation present. No thyromegaly present.  Cardiovascular: Normal rate, regular rhythm, normal heart sounds and intact distal pulses.   Pulmonary/Chest: Effort normal and breath sounds normal. No respiratory distress. He has no wheezes. He has no rales. He exhibits no tenderness.  Abdominal: Soft. Bowel sounds are normal. He exhibits distension. He exhibits no mass. There is no tenderness. There is no rebound and no guarding.  No specific abdominal tenderness with palpation.  Thousand's positive in all 4 quads questionable trace ascites only noted on exam.  Musculoskeletal: Normal range of motion. He exhibits no edema or tenderness.  Lymphadenopathy:    He has no cervical adenopathy.  Neurological: He is alert and oriented to person, place, and time.  Skin: Skin is warm and dry. No rash noted. No erythema. There is pallor.  Psychiatric: Affect normal.  Vitals reviewed.   LABORATORY DATA:. Appointment on 03/05/2015  Component Date Value Ref Range Status  . WBC 03/05/2015 9.9  4.0 - 10.3 10e3/uL Final  . NEUT# 03/05/2015 8.9* 1.5 - 6.5 10e3/uL Final  . HGB 03/05/2015 9.2* 13.0 - 17.1 g/dL Final  . HCT 03/05/2015  28.9* 38.4 - 49.9 % Final  . Platelets 03/05/2015 305  140 - 400 10e3/uL Final  . MCV 03/05/2015 96.3  79.3 - 98.0 fL Final  . MCH 03/05/2015 30.7  27.2 - 33.4 pg Final  . MCHC 03/05/2015 31.8* 32.0 - 36.0 g/dL Final  . RBC 03/05/2015 3.00* 4.20 - 5.82 10e6/uL Final  . RDW 03/05/2015 17.1* 11.0 - 14.6 % Final  . lymph# 03/05/2015 0.6* 0.9 - 3.3 10e3/uL Final  . MONO# 03/05/2015 0.4  0.1 - 0.9 10e3/uL Final  . Eosinophils Absolute 03/05/2015 0.0  0.0 - 0.5 10e3/uL Final  . Basophils Absolute 03/05/2015 0.0  0.0 - 0.1 10e3/uL Final  . NEUT% 03/05/2015 90.3* 39.0 - 75.0 % Final  . LYMPH% 03/05/2015 5.7* 14.0 - 49.0 % Final  . MONO% 03/05/2015 3.7  0.0 - 14.0 % Final  . EOS% 03/05/2015 0.2  0.0 - 7.0 % Final  . BASO% 03/05/2015 0.1  0.0 - 2.0 % Final  . Sodium 03/05/2015 140  136 - 145 mEq/L Final  . Potassium 03/05/2015 3.1* 3.5 - 5.1 mEq/L Final  . Chloride 03/05/2015 106  98 - 109 mEq/L Final  . CO2 03/05/2015 22  22 - 29 mEq/L Final  . Glucose 03/05/2015 129  70 - 140 mg/dl Final  . BUN 03/05/2015 25.5  7.0 - 26.0 mg/dL Final  . Creatinine 03/05/2015 1.4* 0.7 - 1.3 mg/dL Final  . Total Bilirubin 03/05/2015 3.46* 0.20 - 1.20 mg/dL Final  . Alkaline Phosphatase 03/05/2015 906* 40 - 150 U/L Final  . AST 03/05/2015 96* 5 - 34 U/L Final  . ALT 03/05/2015 76* 0 - 55 U/L Final  . Total Protein 03/05/2015 6.6  6.4 - 8.3 g/dL Final  . Albumin 03/05/2015 1.8* 3.5 -  5.0 g/dL Final  . Calcium 03/05/2015 8.2* 8.4 - 10.4 mg/dL Final  . Anion Gap 03/05/2015 12* 3 - 11 mEq/L Final  . EGFR 03/05/2015 59* >90 ml/min/1.73 m2 Final   eGFR is calculated using the CKD-EPI Creatinine Equation (2009)  . CEA 03/05/2015 99.8* 0.0 - 5.0 ng/mL Final  . Protein, ur 03/05/2015 100  Negative- <30 mg/dL Final     RADIOGRAPHIC STUDIES: No results found.  ASSESSMENT/PLAN:    Cancer associated pain Patient complaining of some generalized abdominal discomfort; in and the feeling of bloating.  He has minimal  appetite now; stated he only eats one meal per day.  On exam-abdomen remains soft and essentially nontender with palpation.  Bowel sounds positive in all 4 quads.  Questionable trace ascites noted only.   Chronic renal insufficiency, stage III (moderate) Patient has history of chronic renal insufficiency.  Creatinine today is elevated to 1.4.  Will continue to monitor.   Constipation Patient reports constipation for the past 3 days.  He has tried no over-the-counter stool softeners or mural ask.  Advised that he take stool softeners twice daily; and take Maalox every 4-6 hours until his constipation clears.   Hyperbilirubinemia Bilirubin has increased to 3.46.  Patient is complaining of some mild, generalized abdominal discomfort; and may also have some trace ascites on exam and scan.   Hyperphosphatemia Alkaline phosphatase is increased to 906.  Most likely, this is in direct relation to patient's progression of disease.   Hypoalbuminemia Albumin has decreased to 1.8.  Patient was encouraged to push protein in his diet is much as possible.   Hypokalemia Potassium is decreased to 3.1.  Patient already has potassium tablets at home; and was encouraged to continue taking potassium 3 times per day.   Nausea with vomiting Patient continues to complain of some mild nausea and vomiting.  He states he vomited one chest today.  He typically does not try any anti-emetics.   Rectal cancer, approx. 11 cm from anal verge, s/p LAR and loop ileostomy 02/06/12 Patient presented to the [facility] today to receive his initial cycle of FOLFOX chemotherapy.  However, patient appears much weaker today. He is complaining of nausea/vomiting/constipation, abdominal bloating, and questionable mild ascites.  Labs today reveal a hyperbilirubinemia with a bilirubin of 3.46 and an increasing alkaline phosphatase as well.  CEA has increased from 74 up to 99.  On exam-patient appears increasingly weak and  uncomfortable.  After reviewing all with patient-decision was made to hold the initiation of chemotherapy today.  Patient is in agreement with a hospice referral for further evaluation.  Patient states that he lives alone; and feels that they may be of great assistance for him.  All further chemotherapy will be held; the patient will return to the [facility] for supportive care on an as-needed basis.   Transaminitis Liver enzymes and elevated today; with an AST of 96 and ALT of 76.  Will continue to monitor.   Patient stated understanding of all instructions; and was in agreement with this plan of care. The patient knows to call the clinic with any problems, questions or concerns.   This was a shared visit with Dr. Benay Spice today.  Total time spent with patient was 40 minutes;  with greater than 75 percent of that time spent in face to face counseling regarding patient's symptoms,  and coordination of care and follow up.  Disclaimer: This note was dictated with voice recognition software. Similar sounding words can inadvertently be transcribed  and may not be corrected upon review.   Drue Second, NP 03/06/2015   This was a shared visit with Drue Second. Mr. Lang has progressive metastatic rectal cancer.  He has a poor performance status and is not a candidate for further chemotherapy.  He agrees to a [facility] referral.  Julieanne Manson, MD

## 2015-03-07 ENCOUNTER — Telehealth: Payer: Self-pay

## 2015-03-07 ENCOUNTER — Encounter: Payer: Self-pay | Admitting: Skilled Nursing Facility1

## 2015-03-07 NOTE — Telephone Encounter (Signed)
lvm we are f/u and finding out if hospice has contacted pt. Reiterated appt of 4/6. Please call us if any concerns.

## 2015-03-07 NOTE — Progress Notes (Signed)
Subjective:     Patient ID: [name] [name], male   DOB: [birth year], 64 y.o.   MRN: [id number]  HPI   Review of Systems     Objective:   Physical Exam To identify the pts need for dietetic services in relation to wt loss.    Assessment:     Pt identified as being malnourished.     Plan:     Dietitian contacted the pt via telephone [phone] in order to discuss their possible wt loss. The pt did not answer so a voicemail was left to contact Ernestene Kiel at their earliest convenience.

## 2015-03-13 ENCOUNTER — Other Ambulatory Visit: Payer: Medicare Other

## 2015-03-13 IMAGING — CT CT CHEST W/ CM
2 of 4 series · 13 of 46 positions shown, 15 images · IV contrast (omnipaque)
Comparison: CT of the chest abdomen and pelvis 03/10/2013.

CT CHEST

CLINICAL DATA: History of metastatic rectal cancer.  Chemotherapy
and radiation therapy complete.  Cough.

CT CHEST, ABDOMEN AND PELVIS WITH CONTRAST
TECHNIQUE: Multidetector CT imaging of the chest, abdomen and
pelvis was performed following the standard protocol during bolus
administration of intravenous contrast.
Contrast: 100mL OMNIPAQUE IOHEXOL 300 MG/ML  SOLN

[Series 2: cap with st · axial · 0.73mm/px · z∈[-734,-174]mm · 10 of 134 slices shown, 12 images]
[im 11/134  soft-tissue]
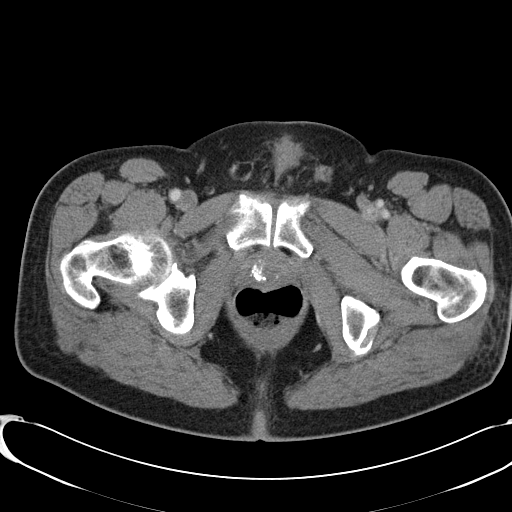
[im 11/134  bone]
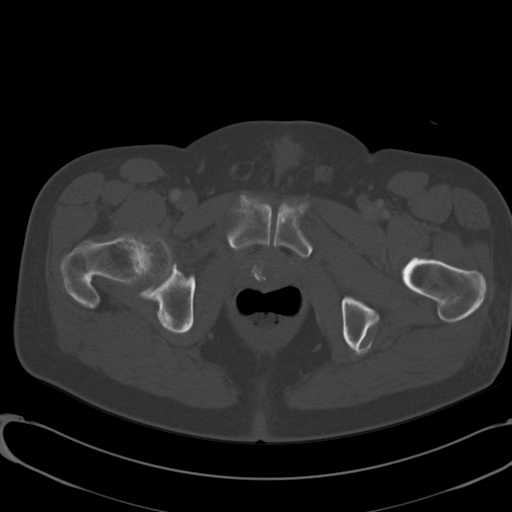
[im 22/134  soft-tissue]
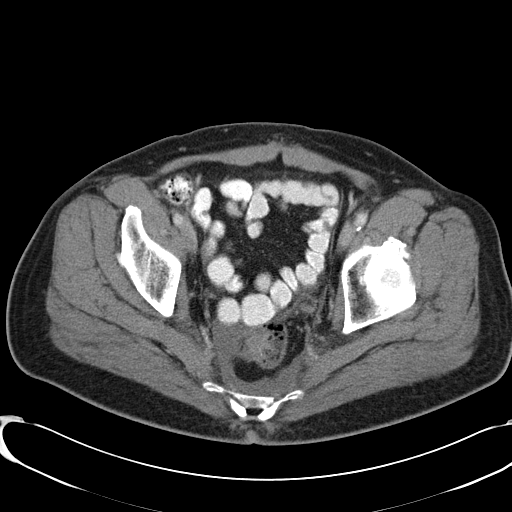
[im 38/134  soft-tissue]
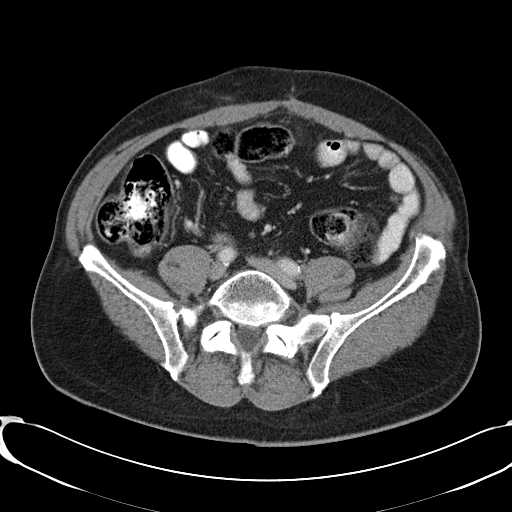
[im 48/134  soft-tissue]
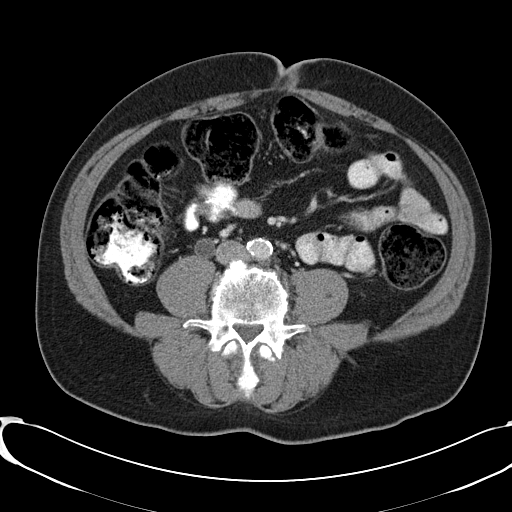
[im 59/134  soft-tissue]
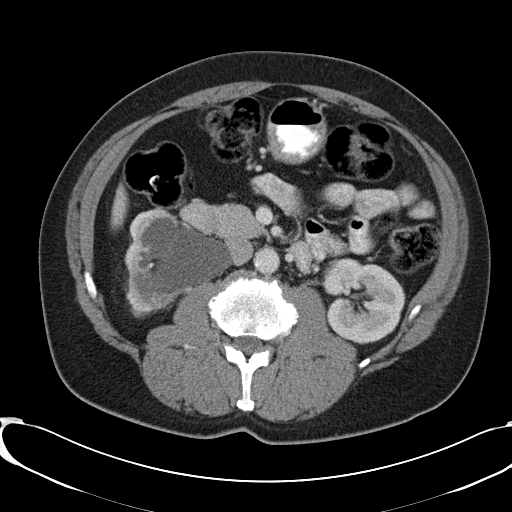
[im 75/134  soft-tissue]
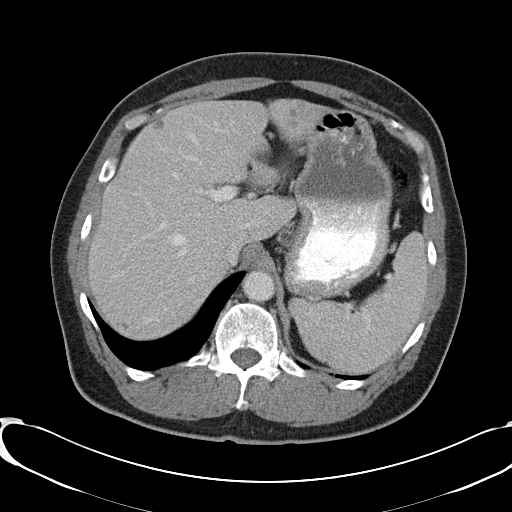
[im 86/134  soft-tissue]
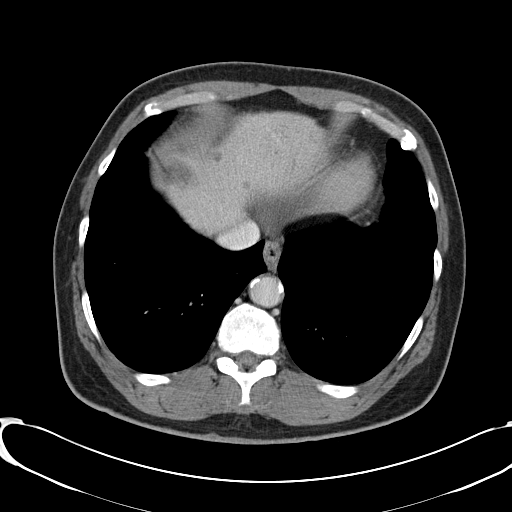
[im 102/134  soft-tissue]
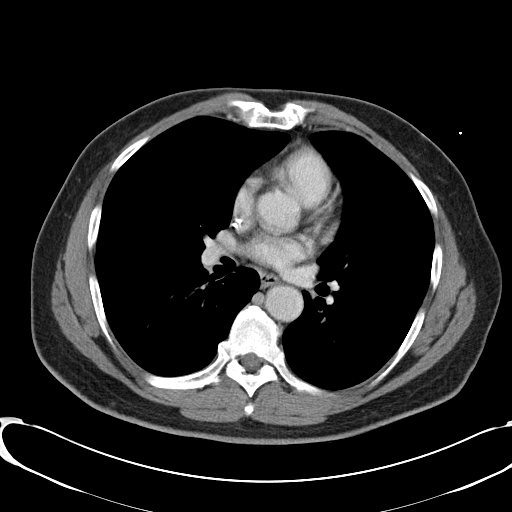
[im 112/134  soft-tissue]
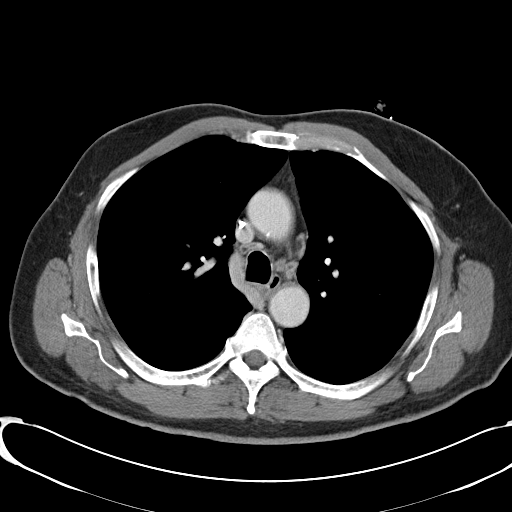
[im 112/134  bone]
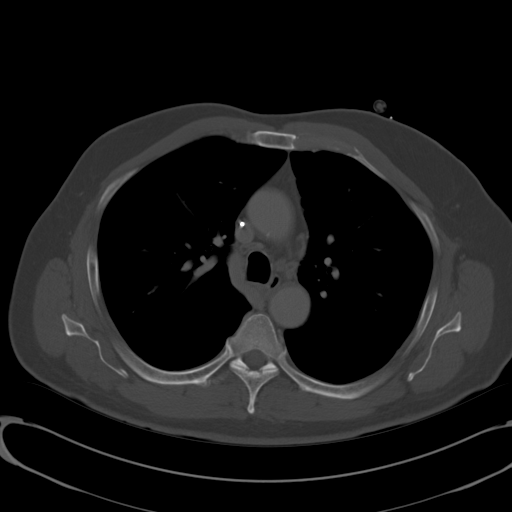
[im 123/134  soft-tissue]
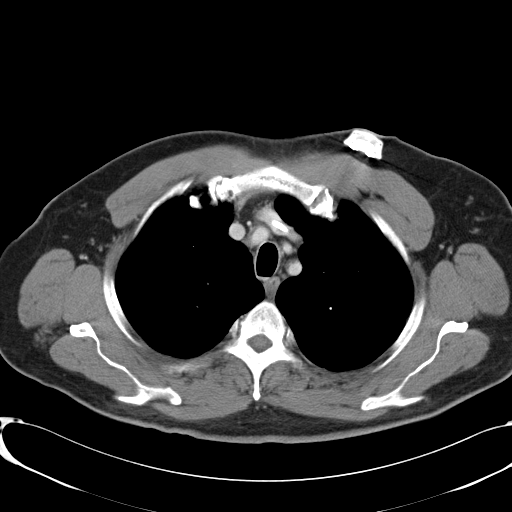

[Series 602: <mpr thick range> · coronal · 1.30mm/px · 3 of 107 slices shown]
[im 36/107  soft-tissue]
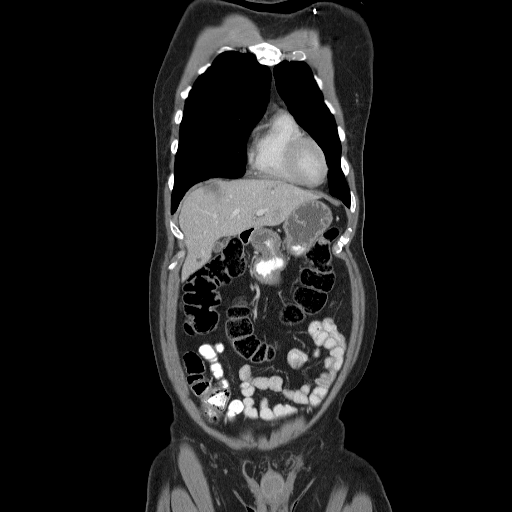
[im 48/107  soft-tissue]
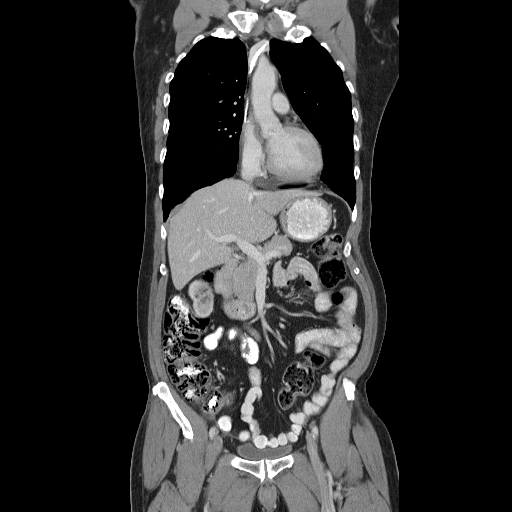
[im 59/107  soft-tissue]
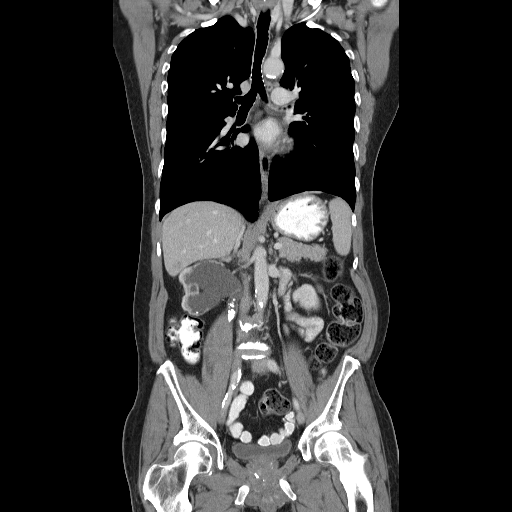

[13 of 46 positions shown; findings below may reference images not displayed]

FINDINGS: Mediastinum: Left subclavian single lumen Port-A-Cath with tip
terminating at the superior cavoatrial junction. Heart size is
normal. There is no significant pericardial fluid, thickening or
pericardial calcification. No pathologically enlarged mediastinum
or right hilar lymph nodes. There is a prominent 8 x 14 mm left
hilar lymph node which although fails to meet CT criteria for
enlargement is clearly larger than the prior examination and is
likely malignant as it is adjacent to the left lower lobe mass.
Esophagus is unremarkable in appearance.

Lungs/Pleura: Previously noted left lower lobe lesions have both
increased in size.  Specifically, the nodule in the medial aspect
of the left lower lobe currently measures 2.4 x 1.7 cm (previously
1.4 x 1.8 cm) and makes a broad contact with the pleura (image 53
of series 5).  There has also been significant enlargement what is
now a mass in the central aspect of the superior segment of the
left lower lobe which currently measures 3.5 x 2.5 cm and is highly
spiculated with irregular nodular margins and multiple tiny
satellite nodules.  This is an direct contact with the left hilum
where there is a borderline enlarged but highly suspicious left
hilar lymph node (discussed above).  There is a background of mild
centrilobular emphysema.  A few other scattered tiny 2-4 mm
pulmonary nodules are seen in the lungs bilaterally, but is similar
to the prior examination.  No acute consolidative air space
disease.  No pleural effusions.

Musculoskeletal: There are no aggressive appearing lytic or blastic
lesions noted in the visualized portions of the skeleton.
IMPRESSION: 1.  Interval enlargement of a large mass in the superior segment of
the left lower lobe (surrounded by multiple tiny satellite
nodules), as well as a enlarging nodule in the medial aspect of the
left lower lobe, with enlarging left hilar lymph nodes, all
compatible with progression of metastatic disease.

2.  Mild centrilobular emphysema.

CT ABDOMEN AND PELVIS
FINDINGS: Abdomen/Pelvis: Again noted are multiple liver lesions.  Several of
these are well-defined and uniformly low attenuation, likely to
represent small cysts.  However, others are more ill-defined and
intermediate in attenuation, compatible with metastatic lesions.
The largest metastasis in segments 2 and 3 of the liver has
significantly increased in size (image 56 of series 2), currently
measuring 5.9 x 3.9 cm (previously 4.2 x 3.4 cm).  Another
prominent lesion in segment 8 of the liver (image 15 series 2) has
also substantially increased in size, currently measuring 3.6 x
cm (previously approximately 1.9 x 1.2 cm).  There also appears to
be at least one new metastatic lesion in the liver measuring 1.9 x
1.4 cm in the inferior aspect of segment 6 (image 71 of series 2).

The appearance of the gallbladder, pancreas and spleen is
unremarkable.  Mild nodularity is again noted throughout both
adrenal glands, without significant change or dominant nodule.
Several subcentimeter low attenuation lesions in the left kidney
remain too small to definitively characterize, but are similar to
the prior study and therefore favored to represent small cysts.
Severe right renal atrophy and chronic severe right-sided
hydroureteronephrosis is redemonstrated.  There are numerous
nonobstructive calculi within the right renal collecting system.
In addition, there are numerous stones layering dependently within
the dilated proximal third of the right ureter.  In the middle
third of the right ureter there is an obstructing stone measuring 6
mm (image 93 of series 2), similar to the prior study.  No bladder
calculi are noted at this time.

Small umbilical hernia containing some omental fat incidentally
noted.  Presacral edema and fluid around the distal rectum, similar
to the prior examination.  Postoperative changes are noted in the
distal rectum related to prior resection.  No pneumoperitoneum.  No
pathologic distension of small bowel.  No definite pathologic
lymphadenopathy is confidently identified within the abdomen or
pelvis on today's examination.  Extensive atherosclerosis
throughout the abdominal and pelvic vasculature without evidence of
aneurysm or dissection.  Extensive prostate gland calcifications
(nonspecific).

Musculoskeletal: There are no aggressive appearing lytic or blastic
lesions noted in the visualized portions of the skeleton.  Well
circumscribed low attenuation lesion in the subcutaneous fat of the
lower right posterior flank is unchanged compared to prior
examination, favored to represent a sebaceous cyst.
IMPRESSION: 1.  Progression of metastatic disease to the liver with increased
size and number of hepatic metastases, as detailed above.

2.  Postoperative changes and post procedural changes around the
rectum including extensive presacral edema and perirectal fluid,
similar to prior examinations, as above.
3. Nephrolithiasis including a chronic obstructive stone in the
middle third of the right ureter measuring 6 mm with a severe right-
sided hydroureteronephrosis and advanced right renal atrophy.
4.  Extensive atherosclerosis.
5.  Additional incidental findings, as above, similar to prior
examinations.

## 2015-03-14 ENCOUNTER — Ambulatory Visit: Payer: Medicare Other | Admitting: Oncology

## 2015-03-14 ENCOUNTER — Ambulatory Visit: Payer: Medicare Other

## 2015-03-14 ENCOUNTER — Other Ambulatory Visit: Payer: Medicare Other

## 2015-04-01 ENCOUNTER — Other Ambulatory Visit: Payer: Self-pay | Admitting: Oncology

## 2015-04-04 ENCOUNTER — Other Ambulatory Visit: Payer: Medicare Other

## 2015-04-04 ENCOUNTER — Ambulatory Visit: Payer: Medicare Other | Admitting: Oncology

## 2015-05-09 DEATH — deceased

## 2015-06-10 ENCOUNTER — Other Ambulatory Visit: Payer: Self-pay | Admitting: Family Medicine

## 2015-07-18 ENCOUNTER — Ambulatory Visit: Payer: Medicare Other | Admitting: Family Medicine
